# Patient Record
Sex: Male | Born: 1945
Health system: Southern US, Community
[De-identification: ages and names within clinical notes are randomized; demographics above are authoritative.]

## PROBLEM LIST (undated history)

## (undated) DIAGNOSIS — F329 Major depressive disorder, single episode, unspecified: Secondary | ICD-10-CM

## (undated) DIAGNOSIS — Z0000 Encounter for general adult medical examination without abnormal findings: Secondary | ICD-10-CM

## (undated) DIAGNOSIS — H269 Unspecified cataract: Secondary | ICD-10-CM

## (undated) DIAGNOSIS — F419 Anxiety disorder, unspecified: Secondary | ICD-10-CM

## (undated) DIAGNOSIS — F32A Depression, unspecified: Secondary | ICD-10-CM

## (undated) DIAGNOSIS — D571 Sickle-cell disease without crisis: Secondary | ICD-10-CM

## (undated) DIAGNOSIS — M26629 Arthralgia of temporomandibular joint, unspecified side: Secondary | ICD-10-CM

## (undated) DIAGNOSIS — J45909 Unspecified asthma, uncomplicated: Secondary | ICD-10-CM

## (undated) DIAGNOSIS — I10 Essential (primary) hypertension: Secondary | ICD-10-CM

## (undated) DIAGNOSIS — E785 Hyperlipidemia, unspecified: Secondary | ICD-10-CM

## (undated) DIAGNOSIS — H4010X0 Unspecified open-angle glaucoma, stage unspecified: Secondary | ICD-10-CM

## (undated) DIAGNOSIS — M109 Gout, unspecified: Secondary | ICD-10-CM

## (undated) DIAGNOSIS — R001 Bradycardia, unspecified: Secondary | ICD-10-CM

## (undated) DIAGNOSIS — E119 Type 2 diabetes mellitus without complications: Secondary | ICD-10-CM

## (undated) DIAGNOSIS — N189 Chronic kidney disease, unspecified: Secondary | ICD-10-CM

## (undated) DIAGNOSIS — C801 Malignant (primary) neoplasm, unspecified: Secondary | ICD-10-CM

## (undated) HISTORY — DX: Unspecified cataract: H26.9

## (undated) HISTORY — DX: Unspecified asthma, uncomplicated: J45.909

## (undated) HISTORY — DX: Arthralgia of temporomandibular joint, unspecified side: M26.629

## (undated) HISTORY — DX: Gout, unspecified: M10.9

## (undated) HISTORY — PX: COLONOSCOPY: SHX174

## (undated) HISTORY — DX: Major depressive disorder, single episode, unspecified: F32.9

## (undated) HISTORY — DX: Chronic kidney disease, unspecified: N18.9

## (undated) HISTORY — DX: Type 2 diabetes mellitus without complications: E11.9

## (undated) HISTORY — DX: Essential (primary) hypertension: I10

## (undated) HISTORY — DX: Bradycardia, unspecified: R00.1

## (undated) HISTORY — DX: Unspecified open-angle glaucoma, stage unspecified: H40.10X0

## (undated) HISTORY — DX: Encounter for general adult medical examination without abnormal findings: Z00.00

## (undated) HISTORY — DX: Hyperlipidemia, unspecified: E78.5

## (undated) HISTORY — DX: Anxiety disorder, unspecified: F41.9

## (undated) HISTORY — DX: Malignant (primary) neoplasm, unspecified: C80.1

## (undated) HISTORY — DX: Sickle-cell disease without crisis: D57.1

## (undated) HISTORY — DX: Depression, unspecified: F32.A

---

## 1995-12-05 HISTORY — PX: INGUINAL HERNIA REPAIR: SUR1180

## 2001-12-04 DIAGNOSIS — F419 Anxiety disorder, unspecified: Secondary | ICD-10-CM

## 2001-12-04 HISTORY — PX: COLON SURGERY: SHX602

## 2001-12-04 HISTORY — DX: Anxiety disorder, unspecified: F41.9

## 2002-08-26 ENCOUNTER — Encounter: Payer: Self-pay | Admitting: General Surgery

## 2002-08-26 ENCOUNTER — Ambulatory Visit (HOSPITAL_COMMUNITY): Admission: RE | Admit: 2002-08-26 | Discharge: 2002-08-26 | Payer: Self-pay | Admitting: General Surgery

## 2002-09-05 ENCOUNTER — Encounter: Payer: Self-pay | Admitting: General Surgery

## 2002-09-11 ENCOUNTER — Encounter (INDEPENDENT_AMBULATORY_CARE_PROVIDER_SITE_OTHER): Payer: Self-pay | Admitting: Specialist

## 2002-09-11 ENCOUNTER — Inpatient Hospital Stay (HOSPITAL_COMMUNITY): Admission: RE | Admit: 2002-09-11 | Discharge: 2002-09-17 | Payer: Self-pay | Admitting: General Surgery

## 2002-10-07 ENCOUNTER — Ambulatory Visit (HOSPITAL_COMMUNITY): Admission: RE | Admit: 2002-10-07 | Discharge: 2002-10-07 | Payer: Self-pay | Admitting: General Surgery

## 2002-10-07 ENCOUNTER — Encounter: Payer: Self-pay | Admitting: General Surgery

## 2003-10-22 ENCOUNTER — Ambulatory Visit (HOSPITAL_COMMUNITY): Admission: RE | Admit: 2003-10-22 | Discharge: 2003-10-22 | Payer: Self-pay | Admitting: General Surgery

## 2004-10-26 ENCOUNTER — Ambulatory Visit: Payer: Self-pay | Admitting: Family Medicine

## 2005-07-20 ENCOUNTER — Ambulatory Visit: Payer: Self-pay | Admitting: Family Medicine

## 2005-11-23 ENCOUNTER — Ambulatory Visit: Payer: Self-pay | Admitting: Family Medicine

## 2006-01-17 ENCOUNTER — Ambulatory Visit: Payer: Self-pay | Admitting: Family Medicine

## 2006-04-18 ENCOUNTER — Ambulatory Visit: Payer: Self-pay | Admitting: Family Medicine

## 2006-06-14 ENCOUNTER — Ambulatory Visit: Payer: Self-pay | Admitting: Family Medicine

## 2006-09-27 ENCOUNTER — Ambulatory Visit: Payer: Self-pay | Admitting: Family Medicine

## 2006-09-30 ENCOUNTER — Encounter: Admission: RE | Admit: 2006-09-30 | Discharge: 2006-09-30 | Payer: Self-pay | Admitting: Family Medicine

## 2006-11-01 ENCOUNTER — Ambulatory Visit: Payer: Self-pay | Admitting: Family Medicine

## 2007-01-03 ENCOUNTER — Ambulatory Visit: Payer: Self-pay | Admitting: Family Medicine

## 2007-02-25 ENCOUNTER — Ambulatory Visit: Payer: Self-pay | Admitting: Gastroenterology

## 2007-03-14 ENCOUNTER — Ambulatory Visit: Payer: Self-pay | Admitting: Gastroenterology

## 2007-07-19 DIAGNOSIS — Z85038 Personal history of other malignant neoplasm of large intestine: Secondary | ICD-10-CM

## 2007-07-19 DIAGNOSIS — I10 Essential (primary) hypertension: Secondary | ICD-10-CM

## 2007-07-19 DIAGNOSIS — F329 Major depressive disorder, single episode, unspecified: Secondary | ICD-10-CM

## 2007-07-25 ENCOUNTER — Telehealth: Payer: Self-pay | Admitting: Family Medicine

## 2007-08-29 ENCOUNTER — Telehealth (INDEPENDENT_AMBULATORY_CARE_PROVIDER_SITE_OTHER): Payer: Self-pay | Admitting: *Deleted

## 2007-10-17 ENCOUNTER — Ambulatory Visit: Payer: Self-pay | Admitting: Family Medicine

## 2007-10-17 DIAGNOSIS — E039 Hypothyroidism, unspecified: Secondary | ICD-10-CM | POA: Insufficient documentation

## 2007-10-17 DIAGNOSIS — E785 Hyperlipidemia, unspecified: Secondary | ICD-10-CM | POA: Insufficient documentation

## 2007-10-17 LAB — CONVERTED CEMR LAB
Blood in Urine, dipstick: NEGATIVE
Ketones, urine, test strip: NEGATIVE
Nitrite: NEGATIVE
Protein, U semiquant: NEGATIVE
Specific Gravity, Urine: 1.015
Urobilinogen, UA: 0.2
WBC Urine, dipstick: NEGATIVE

## 2007-10-20 ENCOUNTER — Encounter: Payer: Self-pay | Admitting: Family Medicine

## 2007-11-11 LAB — CONVERTED CEMR LAB
ALT: 30 units/L (ref 0–53)
AST: 23 units/L (ref 0–37)
Albumin: 3.6 g/dL (ref 3.5–5.2)
Basophils Absolute: 0.1 10*3/uL (ref 0.0–0.1)
Calcium: 10.1 mg/dL (ref 8.4–10.5)
Chloride: 101 meq/L (ref 96–112)
Creatinine, Ser: 1.7 mg/dL — ABNORMAL HIGH (ref 0.4–1.5)
Eosinophils Absolute: 0.1 10*3/uL (ref 0.0–0.6)
Eosinophils Relative: 1.5 % (ref 0.0–5.0)
GFR calc non Af Amer: 44 mL/min
Glucose, Bld: 110 mg/dL — ABNORMAL HIGH (ref 70–99)
HCT: 42.8 % (ref 39.0–52.0)
HDL: 25.7 mg/dL — ABNORMAL LOW (ref >39.0)
MCV: 76.7 fL — ABNORMAL LOW (ref 78.0–100.0)
Platelets: 343 10*3/uL (ref 150–400)
RBC: 5.58 M/uL (ref 4.22–5.81)
RDW: 16.1 % — ABNORMAL HIGH (ref 11.5–14.6)
Total Bilirubin: 0.6 mg/dL (ref 0.3–1.2)
Total CHOL/HDL Ratio: 6.3
Triglycerides: 254 mg/dL (ref 0–149)
VLDL: 51 mg/dL — ABNORMAL HIGH (ref 0–40)
WBC: 8.8 10*3/uL (ref 4.5–10.5)

## 2007-12-26 ENCOUNTER — Telehealth: Payer: Self-pay | Admitting: Family Medicine

## 2008-04-21 ENCOUNTER — Telehealth: Payer: Self-pay | Admitting: Family Medicine

## 2008-06-25 ENCOUNTER — Ambulatory Visit: Payer: Self-pay | Admitting: Family Medicine

## 2008-06-25 DIAGNOSIS — M545 Low back pain, unspecified: Secondary | ICD-10-CM | POA: Insufficient documentation

## 2008-06-25 DIAGNOSIS — R972 Elevated prostate specific antigen [PSA]: Secondary | ICD-10-CM | POA: Insufficient documentation

## 2008-06-25 DIAGNOSIS — M26629 Arthralgia of temporomandibular joint, unspecified side: Secondary | ICD-10-CM | POA: Insufficient documentation

## 2008-10-27 ENCOUNTER — Ambulatory Visit: Payer: Self-pay | Admitting: Family Medicine

## 2008-10-27 DIAGNOSIS — J309 Allergic rhinitis, unspecified: Secondary | ICD-10-CM | POA: Insufficient documentation

## 2008-10-27 DIAGNOSIS — E559 Vitamin D deficiency, unspecified: Secondary | ICD-10-CM

## 2008-10-27 DIAGNOSIS — D649 Anemia, unspecified: Secondary | ICD-10-CM

## 2008-10-27 DIAGNOSIS — T50995A Adverse effect of other drugs, medicaments and biological substances, initial encounter: Secondary | ICD-10-CM

## 2009-03-11 ENCOUNTER — Ambulatory Visit: Payer: Self-pay | Admitting: Family Medicine

## 2009-03-11 DIAGNOSIS — M109 Gout, unspecified: Secondary | ICD-10-CM | POA: Insufficient documentation

## 2009-03-13 LAB — CONVERTED CEMR LAB: Uric Acid, Serum: 7.3 mg/dL (ref 4.0–7.8)

## 2009-10-05 ENCOUNTER — Telehealth: Payer: Self-pay | Admitting: Family Medicine

## 2009-11-03 ENCOUNTER — Ambulatory Visit: Payer: Self-pay | Admitting: Family Medicine

## 2009-11-03 DIAGNOSIS — R350 Frequency of micturition: Secondary | ICD-10-CM

## 2009-11-03 DIAGNOSIS — N400 Enlarged prostate without lower urinary tract symptoms: Secondary | ICD-10-CM | POA: Insufficient documentation

## 2009-11-03 LAB — CONVERTED CEMR LAB
Glucose, Urine, Semiquant: NEGATIVE
Specific Gravity, Urine: 1.01
WBC Urine, dipstick: NEGATIVE
pH: 5.5

## 2009-11-04 LAB — CONVERTED CEMR LAB
ALT: 18 units/L (ref 0–53)
Albumin: 3.9 g/dL (ref 3.5–5.2)
BUN: 12 mg/dL (ref 6–23)
Basophils Absolute: 0 10*3/uL (ref 0.0–0.1)
CO2: 31 meq/L (ref 19–32)
Chloride: 102 meq/L (ref 96–112)
Direct LDL: 135.3 mg/dL
Glucose, Bld: 104 mg/dL — ABNORMAL HIGH (ref 70–99)
HCT: 45.9 % (ref 39.0–52.0)
Lymphs Abs: 1.9 10*3/uL (ref 0.7–4.0)
MCV: 80.7 fL (ref 78.0–100.0)
Monocytes Absolute: 0.5 10*3/uL (ref 0.1–1.0)
Platelets: 171 10*3/uL (ref 150.0–400.0)
Potassium: 4.5 meq/L (ref 3.5–5.1)
RDW: 14.6 % (ref 11.5–14.6)
TSH: 2.25 microintl units/mL (ref 0.35–5.50)
Total Bilirubin: 0.8 mg/dL (ref 0.3–1.2)
Vit D, 25-Hydroxy: 19 ng/mL — ABNORMAL LOW (ref 30–89)

## 2010-07-05 ENCOUNTER — Ambulatory Visit: Payer: Self-pay | Admitting: Family Medicine

## 2010-12-27 ENCOUNTER — Telehealth: Payer: Self-pay | Admitting: Family Medicine

## 2010-12-29 ENCOUNTER — Ambulatory Visit
Admission: RE | Admit: 2010-12-29 | Discharge: 2010-12-29 | Payer: Self-pay | Source: Home / Self Care | Attending: Family Medicine | Admitting: Family Medicine

## 2010-12-29 DIAGNOSIS — M67919 Unspecified disorder of synovium and tendon, unspecified shoulder: Secondary | ICD-10-CM | POA: Insufficient documentation

## 2010-12-29 DIAGNOSIS — K589 Irritable bowel syndrome without diarrhea: Secondary | ICD-10-CM | POA: Insufficient documentation

## 2010-12-29 DIAGNOSIS — M719 Bursopathy, unspecified: Secondary | ICD-10-CM

## 2011-01-01 LAB — CONVERTED CEMR LAB
ALT: 20 units/L (ref 0–53)
Basophils Absolute: 0 10*3/uL (ref 0.0–0.1)
Bilirubin Urine: NEGATIVE
Bilirubin, Direct: 0.1 mg/dL (ref 0.0–0.3)
Blood in Urine, dipstick: NEGATIVE
CO2: 32 meq/L (ref 19–32)
Calcium: 10 mg/dL (ref 8.4–10.5)
Chloride: 103 meq/L (ref 96–112)
Cholesterol: 210 mg/dL (ref 0–200)
Glucose, Urine, Semiquant: NEGATIVE
HDL: 37.4 mg/dL — ABNORMAL LOW (ref >39.0)
Lymphocytes Relative: 41 % (ref 12.0–46.0)
MCHC: 33.2 g/dL (ref 30.0–36.0)
Neutro Abs: 2.2 10*3/uL (ref 1.4–7.7)
Neutrophils Relative %: 44.9 % (ref 43.0–77.0)
PSA: 8.16 ng/mL — ABNORMAL HIGH (ref 0.10–4.00)
Platelets: 176 10*3/uL (ref 150–400)
Potassium: 4.2 meq/L (ref 3.5–5.1)
Protein, U semiquant: NEGATIVE
RDW: 15.4 % — ABNORMAL HIGH (ref 11.5–14.6)
Sodium: 141 meq/L (ref 135–145)
Total Bilirubin: 0.5 mg/dL (ref 0.3–1.2)
Triglycerides: 298 mg/dL (ref 0–149)
Urobilinogen, UA: 0.2
VLDL: 60 mg/dL — ABNORMAL HIGH (ref 0–40)
pH: 7

## 2011-01-03 NOTE — Assessment & Plan Note (Signed)
Summary: ROA/RCD   Vital Signs:  Patient profile:   65 year old male Weight:      211 pounds O2 Sat:      94 % Temp:     98.3 degrees F Pulse rate:   60 / minute Pulse rhythm:   regular BP sitting:   140 / 82  (left arm) Cuff size:   regular  Vitals Entered By: Pura Spice, RN (July 05, 2010 2:30 PM) CC: gout hasn't taken indocin in 2 wks    History of Present Illness: this 65 year old black male with past history of colon cancer in remission, hypertension which is controlled he is in today complaining of pain in both MP joints of the big toes bilaterally swollen painful tender and erythematous area patient has not taken Indocin for the past 2 weeks No other complaint other than refill and necessary medicines  Allergies (verified): No Known Drug Allergies  Past History:  Past Medical History: Last updated: 11/03/2009 Glaucoma Colon cancer, hx of Depression Hypertension allergic rhinitis  PAP MAMMOGRAM EYE EXAM  dr Marene Lenz   2009 EKG   2009 DEXA-BONE DENSITY PNEUMONIA VACCINE SHINGLES VACCINE DT  2007 Colon   2008  repeat  5 yrs   SMOKER  no    Specialist: Dr Harlin Rain uroglogist   Past Surgical History: Last updated: 11/03/2009 Hernia Repair colon cancer surgery  Social History: Last updated: 07/19/2007 Occupation: Married Never Smoked Alcohol use-no Drug use-no Regular exercise-yes  Risk Factors: Smoking Status: never (11/03/2009)  Review of Systems      See HPI  The patient denies anorexia, fever, weight loss, weight gain, vision loss, decreased hearing, hoarseness, chest pain, syncope, dyspnea on exertion, peripheral edema, prolonged cough, headaches, hemoptysis, abdominal pain, melena, hematochezia, severe indigestion/heartburn, hematuria, incontinence, genital sores, muscle weakness, suspicious skin lesions, transient blindness, difficulty walking, depression, unusual weight change, abnormal bleeding, enlarged lymph nodes, angioedema, breast  masses, and testicular masses.    Physical Exam  General:  Well-developed,well-nourished,in no acute distress; alert,appropriate and cooperative throughout examination Head:  Normocephalic and atraumatic without obvious abnormalities. No apparent alopecia or balding. Eyes:  No corneal or conjunctival inflammation noted. EOMI. Perrla. Funduscopic exam benign, without hemorrhages, exudates or papilledema. Vision grossly normal. Ears:  External ear exam shows no significant lesions or deformities.  Otoscopic examination reveals clear canals, tympanic membranes are intact bilaterally without bulging, retraction, inflammation or discharge. Hearing is grossly normal bilaterally. Nose:  boggy pale nasal mucosa with clear drainage Mouth:  Oral mucosa and oropharynx without lesions or exudates.  Teeth in good repair. Lungs:  Normal respiratory effort, chest expands symmetrically. Lungs are clear to auscultation, no crackles or wheezes. Heart:  Normal rate and regular rhythm. S1 and S2 normal without gallop, murmur, click, rub or other extra sounds. Msk:  MP joints of first toes both feet swollen tender and warm Pulses:  R and L carotid,radial,femoral,dorsalis pedis and posterior tibial pulses are full and equal bilaterally Extremities:  No clubbing, cyanosis, edema, or deformity noted with normal full range of motion of all joints.   Neurologic:  No cranial nerve deficits noted. Station and gait are normal. Plantar reflexes are down-going bilaterally. DTRs are symmetrical throughout. Sensory, motor and coordinative functions appear intact. Skin:  Intact without suspicious lesions or rashes   Impression & Recommendations:  Problem # 1:  GOUT, ACUTE (ICD-274.01) Assessment New  His updated medication list for this problem includes:    Mobic 15 Mg Tabs (Meloxicam) .Marland Kitchen... 1 once daily for  arthritis    Indocin Sr 75 Mg Cr-caps (Indomethacin) .Marland Kitchen... 1 three times a day pc for gout, as pain deminishes,  decrease to 1 or2 per  day, restart when needed  Problem # 2:  HYPERTENSION (ICD-401.9) Assessment: Improved  His updated medication list for this problem includes:    Furosemide 40 Mg Tabs (Furosemide) .Marland Kitchen... Take 1 in the am and 1 in mid-afternoon for edema and bp    Benicar Hct 40-12.5 Mg Tabs (Olmesartan medoxomil-hctz) .Marland Kitchen... 1 tablet by mouth once a day  Problem # 3:  URINARY FREQUENCY (ICD-788.41) Assessment: Improved  His updated medication list for this problem includes:    Enablex 15 Mg Tb24 (Darifenacin hydrobromide) .Marland Kitchen... Take 1 tablet by mouth once a day    Flomax 0.4 Mg Cp24 (Tamsulosin hcl) .Marland Kitchen... Take 1 capsule by mouth once a day    Avodart 0.5 Mg Caps (Dutasteride) .Marland Kitchen... 1 each day for enlarged prostate    Vesicare 5 Mg Tabs (Solifenacin succinate) .Marland Kitchen... 1 qd  Problem # 4:  LOW BACK PAIN, CHRONIC (ICD-724.2) Assessment: Unchanged  His updated medication list for this problem includes:    Hydrocodone-acetaminophen 10-650 Mg Tabs (Hydrocodone-acetaminophen) .Marland Kitchen... Take 1 tablet by mouth every four to six hours no early refills no more than 4 per day    Mobic 15 Mg Tabs (Meloxicam) .Marland Kitchen... 1 once daily for arthritis    Adult Aspirin Low Strength 81 Mg Tbdp (Aspirin)    Indocin Sr 75 Mg Cr-caps (Indomethacin) .Marland Kitchen... 1 three times a day pc for gout, as pain deminishes, decrease to 1 or2 per  day, restart when needed  Problem # 5:  HYPERLIPIDEMIA (ICD-272.4) Assessment: Improved  His updated medication list for this problem includes:    Simvastatin 20 Mg Tabs (Simvastatin) .Marland Kitchen... 1 hs for high cholesterol  Complete Medication List: 1)  Furosemide 40 Mg Tabs (Furosemide) .... Take 1 in the am and 1 in mid-afternoon for edema and bp 2)  Benicar Hct 40-12.5 Mg Tabs (Olmesartan medoxomil-hctz) .Marland Kitchen.. 1 tablet by mouth once a day 3)  Enablex 15 Mg Tb24 (Darifenacin hydrobromide) .... Take 1 tablet by mouth once a day 4)  Flomax 0.4 Mg Cp24 (Tamsulosin hcl) .... Take 1 capsule by  mouth once a day 5)  Hydrocodone-acetaminophen 10-650 Mg Tabs (Hydrocodone-acetaminophen) .... Take 1 tablet by mouth every four to six hours no early refills no more than 4 per day 6)  Lexapro 10 Mg Tabs (Escitalopram oxalate) .... Take 1 tablet by mouth once a day 7)  Mobic 15 Mg Tabs (Meloxicam) .Marland Kitchen.. 1 once daily for arthritis 8)  Xalatan 0.005 % Soln (Latanoprost) .... Apply 9)  Cod Liver Oil Oil (Cod liver oil) 10)  Bl Garlic Oil 3 Mg Caps (Garlic) 11)  Adult Aspirin Low Strength 81 Mg Tbdp (Aspirin) 12)  Promethazine Hcl 25 Mg Tabs (Promethazine hcl) .Marland Kitchen.. 1 q 4h as needed nausea 13)  Allegra-d 12 Hour 60-120 Mg Xr12h-tab (Fexofenadine-pseudoephedrine) .Marland Kitchen.. 1 q12h as needed  for nasal congestion 14)  Indocin Sr 75 Mg Cr-caps (Indomethacin) .Marland Kitchen.. 1 three times a day pc for gout, as pain deminishes, decrease to 1 or2 per  day, restart when needed 15)  Avodart 0.5 Mg Caps (Dutasteride) .Marland Kitchen.. 1 each day for enlarged prostate 16)  Simvastatin 20 Mg Tabs (Simvastatin) .Marland Kitchen.. 1 hs for high cholesterol 17)  Vitamin D (ergocalciferol) 50000 Unit Caps (Ergocalciferol) .Marland KitchenMarland KitchenMarland Kitchen 1 weekly for 12 weeks 18)  Vesicare 5 Mg Tabs (Solifenacin succinate) .Marland Kitchen.. 1 qd  Patient  Instructions: 1)  acute gout episode, restart her indomethacin t.i.d. 2)  Continue other medications as prescribed Prescriptions: HYDROCODONE-ACETAMINOPHEN 10-650 MG TABS (HYDROCODONE-ACETAMINOPHEN) Take 1 tablet by mouth every four to six hours NO EARLY REFILLS NO MORE THAN 4 PER DAY  #120 x 5   Entered and Authorized by:   Judithann Sheen MD   Signed by:   Judithann Sheen MD on 07/05/2010   Method used:   Print then Give to Patient   RxID:   [id number]

## 2011-01-05 NOTE — Assessment & Plan Note (Signed)
Summary: stomach issues/per Dr. Marlon Pel   Vital Signs:  Patient profile:   65 year old male Weight:      213 pounds BMI:     28.99 Pulse rate:   68 / minute Pulse rhythm:   regular BP sitting:   142 / 94  (right arm)  Vitals Entered By: Kyung Rudd, CMA (December 29, 2010 2:59 PM) CC: pt c/o stomach issues x 4days.Marland KitchenMarland Kitchenfeels like a knot in stomach...pt also c/o rt shoulder pain   History of Present Illness: This 65 year old black disabled male with past history of carcinoma of the colon who has done well and is in remission following surgery and chemotherapy. His complaint today is that of having cramping abdominal pain over the past week and has had 2-3 occasions of nausea and vomiting. This is improved somewhat but continued to have wrong with him over the abdomen no diarrhea  Hypertension is well controlled Chronic rhinitis controlled with Allegra-D Past history of gallop which had been fairly well controlled No other complaints Continues to have complaint of chronic back pain  Current Medications (verified): 1)  Furosemide 40 Mg  Tabs (Furosemide) .... Take 1 in The Am and 1 in Mid-Afternoon For Edema and Bp 2)  Benicar Hct 40-12.5 Mg Tabs (Olmesartan Medoxomil-Hctz) .Marland Kitchen.. 1 Tablet By Mouth Once A Day 3)  Flomax 0.4 Mg Cp24 (Tamsulosin Hcl) .... Take 1 Capsule By Mouth Once A Day 4)  Hydrocodone-Acetaminophen 10-650 Mg Tabs (Hydrocodone-Acetaminophen) .... Take 1 Tablet By Mouth Every Four To Six Hours No Early Refills No More Than 4 Per Day 5)  Lexapro 10 Mg Tabs (Escitalopram Oxalate) .... Take 1 Tablet By Mouth Once A Day 6)  Mobic 15 Mg Tabs (Meloxicam) .Marland Kitchen.. 1 Once Daily For Arthritis 7)  Xalatan 0.005 % Soln (Latanoprost) .... Apply 8)  Cod Liver Oil   Oil (Cod Liver Oil) 9)  Bl Garlic Oil 3 Mg  Caps (Garlic) 10)  Adult Aspirin Low Strength 81 Mg  Tbdp (Aspirin) 11)  Promethazine Hcl 25 Mg  Tabs (Promethazine Hcl) .Marland Kitchen.. 1 Q 4h As Needed Nausea 12)  Allegra-D 12 Hour  60-120 Mg Xr12h-Tab (Fexofenadine-Pseudoephedrine) .Marland Kitchen.. 1 Q12h As Needed  For Nasal Congestion 13)  Indocin Sr 75 Mg Cr-Caps (Indomethacin) .Marland Kitchen.. 1 Three Times A Day Pc For Gout, As Pain Deminishes, Decrease To 1 Or2 Per  Day, Restart When Needed 14)  Avodart 0.5 Mg Caps (Dutasteride) .Marland Kitchen.. 1 Each Day For Enlarged Prostate 15)  Simvastatin 20 Mg Tabs (Simvastatin) .Marland Kitchen.. 1 Hs For High Cholesterol 16)  Vitamin D (Ergocalciferol) 50000 Unit Caps (Ergocalciferol) .Marland Kitchen.. 1 Weekly For 12 Weeks 17)  Vesicare 5 Mg Tabs (Solifenacin Succinate) .Marland Kitchen.. 1 Qd  Allergies (verified): No Known Drug Allergies  Past History:  Past Medical History: Last updated: 11/03/2009 Glaucoma Colon cancer, hx of Depression Hypertension allergic rhinitis  PAP MAMMOGRAM EYE EXAM  dr Marene Lenz   2009 EKG   2009 DEXA-BONE DENSITY PNEUMONIA VACCINE SHINGLES VACCINE DT  2007 Colon   2008  repeat  5 yrs   SMOKER  no    Specialist: Dr Harlin Rain uroglogist   Past Surgical History: Last updated: 11/03/2009 Hernia Repair colon cancer surgery  Social History: Last updated: 12/29/2010 Occupation:disabled Married Never Smoked Alcohol use-no Drug use-no Regular exercise-yes  Risk Factors: Smoking Status: never (11/03/2009)  Social History: Reviewed history from 07/19/2007 and no changes required. Occupation:disabled Married Never Smoked Alcohol use-no Drug use-no Regular exercise-yes  Review of Systems      See HPI  Physical Exam  General:  Well-developed,well-nourished,in no acute distress; alert,appropriate and cooperative throughout examination Lungs:  Normal respiratory effort, chest expands symmetrically. Lungs are clear to auscultation, no crackles or wheezes. Heart:  Normal rate and regular rhythm. S1 and S2 normal without gallop, murmur, click, rub or other extra sounds. Abdomen:  increased Valsalva was I'll dominant tenderness over the upper and lower abdomen no portal tenderness liver spleen  and kidneys are nonpalpable well healed operative scar from cancer surgery, colon surgery Rectal:  none exam Msk:  No deformity or scoliosis noted of thoracic or lumbar spine.     Impression & Recommendations:  Problem # 1:  IRRITABLE BOWEL SYNDROME (ICD-564.1) Assessment New  Bentyl 25 mg q.i.d. a.c. h.s.  Orders: Prescription Created Electronically [id number])  Problem # 2:  BURSITIS, RIGHT SHOULDER (ICD-726.10) Assessment: New  prednisone 20 mg decrease in dosage  Orders: Prescription Created Electronically [id number])  Problem # 3:  LOW BACK PAIN, CHRONIC (ICD-724.2) Assessment: Unchanged  His updated medication list for this problem includes:    Hydrocodone-acetaminophen 10-650 Mg Tabs (Hydrocodone-acetaminophen) .Marland Kitchen... Take 1 tablet by mouth every four to six hours no early refills no more than 4 per day    Mobic 15 Mg Tabs (Meloxicam) .Marland Kitchen... 1 once daily for arthritis    Adult Aspirin Low Strength 81 Mg Tbdp (Aspirin)    Indocin Sr 75 Mg Cr-caps (Indomethacin) .Marland Kitchen... 1 three times a day pc for gout, as pain deminishes, decrease to 1 or2 per  day, restart when needed  Problem # 4:  HYPERTENSION (ICD-401.9) Assessment: Improved  His updated medication list for this problem includes:    Furosemide 40 Mg Tabs (Furosemide) .Marland Kitchen... Take 1 in the am and 1 in mid-afternoon for edema and bp    Benicar Hct 40-12.5 Mg Tabs (Olmesartan medoxomil-hctz) .Marland Kitchen... 1 tablet by mouth once a day  Complete Medication List: 1)  Furosemide 40 Mg Tabs (Furosemide) .... Take 1 in the am and 1 in mid-afternoon for edema and bp 2)  Benicar Hct 40-12.5 Mg Tabs (Olmesartan medoxomil-hctz) .Marland Kitchen.. 1 tablet by mouth once a day 3)  Flomax 0.4 Mg Cp24 (Tamsulosin hcl) .... Take 1 capsule by mouth once a day 4)  Hydrocodone-acetaminophen 10-650 Mg Tabs (Hydrocodone-acetaminophen) .... Take 1 tablet by mouth every four to six hours no early refills no more than 4 per day 5)  Lexapro 10 Mg Tabs (Escitalopram oxalate)  .... Take 1 tablet by mouth once a day 6)  Mobic 15 Mg Tabs (Meloxicam) .Marland Kitchen.. 1 once daily for arthritis 7)  Xalatan 0.005 % Soln (Latanoprost) .... Apply 8)  Cod Liver Oil Oil (Cod liver oil) 9)  Bl Garlic Oil 3 Mg Caps (Garlic) 10)  Adult Aspirin Low Strength 81 Mg Tbdp (Aspirin) 11)  Promethazine Hcl 25 Mg Tabs (Promethazine hcl) .Marland Kitchen.. 1 q 4h as needed nausea 12)  Allegra-d 12 Hour 60-120 Mg Xr12h-tab (Fexofenadine-pseudoephedrine) .Marland Kitchen.. 1 q12h as needed  for nasal congestion 13)  Indocin Sr 75 Mg Cr-caps (Indomethacin) .Marland Kitchen.. 1 three times a day pc for gout, as pain deminishes, decrease to 1 or2 per  day, restart when needed 14)  Avodart 0.5 Mg Caps (Dutasteride) .Marland Kitchen.. 1 each day for enlarged prostate 15)  Simvastatin 20 Mg Tabs (Simvastatin) .Marland Kitchen.. 1 hs for high cholesterol 16)  Vitamin D (ergocalciferol) 50000 Unit Caps (Ergocalciferol) .Marland KitchenMarland KitchenMarland Kitchen 1 weekly for 12 weeks 17)  Prednisone 20 Mg Tabs (Prednisone) .Marland Kitchen.. 1 tidpc for 3 days then 1 bidpc for 6 days then 1 once  daily for shoulder 18)  Bentyl 20 Mg Tabs (Dicyclomine hcl) .Marland Kitchen.. 1 qid ac and hs for irritable bowel  Patient Instructions: 1)  your acute problems at this time his ear while syndrome take Bentyl 20 mg q.i.d. as instructed 2)  Her side his right shoulder take prednisone 20 mg decrease in dosage as instructed 3)  Refill of the medication Prescriptions: HYDROCODONE-ACETAMINOPHEN 10-650 MG TABS (HYDROCODONE-ACETAMINOPHEN) Take 1 tablet by mouth every four to six hours NO EARLY REFILLS NO MORE THAN 4 PER DAY  #120 x 5   Entered and Authorized by:   Judithann Sheen MD   Signed by:   Judithann Sheen MD on 12/29/2010   Method used:   Print then Give to Patient   RxID:   [id number] BENTYL 20 MG TABS (DICYCLOMINE HCL) 1 qid ac and hs for irritable bowel  #50 x 1   Entered and Authorized by:   Judithann Sheen MD   Signed by:   Judithann Sheen MD on 12/29/2010   Method used:   Electronically to        [facility]  (retail)       [address]       [place], [place]  [address]       Ph: [phone]       Fax: [phone]   RxID:   [id number] PREDNISONE 20 MG TABS (PREDNISONE) 1 tidpc for 3 days then 1 bidpc for 6 days then 1 once daily for shoulder  #24 x 0   Entered and Authorized by:   Judithann Sheen MD   Signed by:   Judithann Sheen MD on 12/29/2010   Method used:   Electronically to        [facility] (retail)       [address]       [place], [place]  [address]       Ph: [phone]       Fax: [phone]   RxID:   [id number]    Orders Added: 1)  Prescription Created Electronically [G8553] 2)  Est. Patient Level IV [27035]

## 2011-01-05 NOTE — Progress Notes (Signed)
Summary: Pt req work in appt with Dr Scotty Court Only re: stomach pain  Phone Note Call from Patient Call back at Home Phone [phone] Call back at [phone] cell   Caller: Patient Summary of Call: Pt called and is req to get a work in appt with Dr. Scotty Court only re: stomach issues. Pt refuses to see another doctor. Pls advise.  Initial call taken by: Lucy Antigua,  December 27, 2010 3:58 PM  Follow-up for Phone Call        per Dr Scotty Court ok to work in on Thursday Follow-up by: Alfred Levins, CMA,  December 27, 2010 5:18 PM  Additional Follow-up for Phone Call Additional follow up Details #1::        I called pt and sch for ov on Thurs 12/29/10 at 2:30pm Additional Follow-up by: Lucy Antigua,  December 28, 2010 4:50 PM

## 2011-01-21 ENCOUNTER — Other Ambulatory Visit: Payer: Self-pay | Admitting: Family Medicine

## 2011-02-09 ENCOUNTER — Encounter: Payer: Self-pay | Admitting: Family Medicine

## 2011-02-16 ENCOUNTER — Encounter: Payer: Self-pay | Admitting: Family Medicine

## 2011-02-16 ENCOUNTER — Ambulatory Visit (INDEPENDENT_AMBULATORY_CARE_PROVIDER_SITE_OTHER): Payer: Medicare Other | Admitting: Family Medicine

## 2011-02-16 VITALS — BP 160/90 | HR 59 | Temp 97.9°F | Ht 72.0 in | Wt 215.0 lb

## 2011-02-16 DIAGNOSIS — D649 Anemia, unspecified: Secondary | ICD-10-CM

## 2011-02-16 DIAGNOSIS — E039 Hypothyroidism, unspecified: Secondary | ICD-10-CM

## 2011-02-16 DIAGNOSIS — N401 Enlarged prostate with lower urinary tract symptoms: Secondary | ICD-10-CM

## 2011-02-16 DIAGNOSIS — E785 Hyperlipidemia, unspecified: Secondary | ICD-10-CM

## 2011-02-16 DIAGNOSIS — E559 Vitamin D deficiency, unspecified: Secondary | ICD-10-CM

## 2011-02-16 DIAGNOSIS — G253 Myoclonus: Secondary | ICD-10-CM

## 2011-02-16 DIAGNOSIS — I10 Essential (primary) hypertension: Secondary | ICD-10-CM

## 2011-02-16 DIAGNOSIS — R350 Frequency of micturition: Secondary | ICD-10-CM

## 2011-02-16 DIAGNOSIS — E569 Vitamin deficiency, unspecified: Secondary | ICD-10-CM

## 2011-02-16 DIAGNOSIS — N138 Other obstructive and reflux uropathy: Secondary | ICD-10-CM

## 2011-02-16 DIAGNOSIS — M109 Gout, unspecified: Secondary | ICD-10-CM

## 2011-02-16 DIAGNOSIS — Z85038 Personal history of other malignant neoplasm of large intestine: Secondary | ICD-10-CM

## 2011-02-16 LAB — POCT URINALYSIS DIPSTICK
Bilirubin, UA: NEGATIVE
Blood, UA: NEGATIVE
Glucose, UA: NEGATIVE
Ketones, UA: NEGATIVE
Leukocytes, UA: NEGATIVE
Nitrite, UA: NEGATIVE
Protein, UA: NEGATIVE
Spec Grav, UA: 1.01
Urobilinogen, UA: 0.2
pH, UA: 5.5

## 2011-02-16 LAB — CBC WITH DIFFERENTIAL/PLATELET
Basophils Absolute: 0 10*3/uL (ref 0.0–0.1)
Basophils Relative: 0.6 % (ref 0.0–3.0)
Eosinophils Absolute: 0.1 10*3/uL (ref 0.0–0.7)
Eosinophils Relative: 1.3 % (ref 0.0–5.0)
HCT: 40.7 % (ref 39.0–52.0)
Hemoglobin: 13.6 g/dL (ref 13.0–17.0)
Lymphocytes Relative: 37.2 % (ref 12.0–46.0)
Lymphs Abs: 1.8 10*3/uL (ref 0.7–4.0)
MCHC: 33.5 g/dL (ref 30.0–36.0)
MCV: 78.7 fl (ref 78.0–100.0)
Monocytes Absolute: 0.7 10*3/uL (ref 0.1–1.0)
Monocytes Relative: 13.4 % — ABNORMAL HIGH (ref 3.0–12.0)
Neutro Abs: 2.3 10*3/uL (ref 1.4–7.7)
Neutrophils Relative %: 47.5 % (ref 43.0–77.0)
Platelets: 183 10*3/uL (ref 150.0–400.0)
RBC: 5.17 Mil/uL (ref 4.22–5.81)
RDW: 16.9 % — ABNORMAL HIGH (ref 11.5–14.6)
WBC: 4.9 10*3/uL (ref 4.5–10.5)

## 2011-02-16 LAB — HEPATIC FUNCTION PANEL
Bilirubin, Direct: 0.1 mg/dL (ref 0.0–0.3)
Total Protein: 7.3 g/dL (ref 6.0–8.3)

## 2011-02-16 LAB — LIPID PANEL
HDL: 36.2 mg/dL — ABNORMAL LOW (ref >39.00)
Total CHOL/HDL Ratio: 4
Triglycerides: 222 mg/dL — ABNORMAL HIGH (ref 0.0–149.0)
VLDL: 44.4 mg/dL — ABNORMAL HIGH (ref 0.0–40.0)

## 2011-02-16 LAB — BASIC METABOLIC PANEL
Calcium: 9.5 mg/dL (ref 8.4–10.5)
Creatinine, Ser: 1.2 mg/dL (ref 0.4–1.5)
GFR: 74.99 mL/min (ref >60.00)
Glucose, Bld: 90 mg/dL (ref 70–99)
Sodium: 140 mEq/L (ref 135–145)

## 2011-02-16 LAB — LDL CHOLESTEROL, DIRECT: Direct LDL: 73.4 mg/dL

## 2011-02-16 LAB — PSA: PSA: 5.07 ng/mL — ABNORMAL HIGH (ref 0.10–4.00)

## 2011-02-16 LAB — TSH: TSH: 2.43 u[IU]/mL (ref 0.35–5.50)

## 2011-02-16 MED ORDER — TAMSULOSIN HCL 0.4 MG PO CAPS: 0.4000 mg | ORAL_CAPSULE | ORAL | Status: DC

## 2011-02-16 MED ORDER — PROMETHAZINE HCL 25 MG PO TABS: 25.0000 mg | ORAL_TABLET | ORAL | Status: DC | PRN

## 2011-02-16 MED ORDER — MELOXICAM 15 MG PO TABS: 15.0000 mg | ORAL_TABLET | Freq: Every day | ORAL | Status: DC

## 2011-02-16 MED ORDER — OLMESARTAN MEDOXOMIL-HCTZ 40-12.5 MG PO TABS: 1.0000 | ORAL_TABLET | Freq: Every day | ORAL | Status: DC

## 2011-02-16 MED ORDER — FUROSEMIDE 40 MG PO TABS: 40.0000 mg | ORAL_TABLET | Freq: Two times a day (BID) | ORAL | Status: DC

## 2011-02-16 MED ORDER — DICYCLOMINE HCL 20 MG PO TABS: 20.0000 mg | ORAL_TABLET | Freq: Three times a day (TID) | ORAL | Status: DC

## 2011-02-16 MED ORDER — SIMVASTATIN 20 MG PO TABS: 20.0000 mg | ORAL_TABLET | Freq: Every day | ORAL | Status: DC

## 2011-02-16 MED ORDER — ESCITALOPRAM OXALATE 10 MG PO TABS: 10.0000 mg | ORAL_TABLET | Freq: Every day | ORAL | Status: DC

## 2011-02-16 MED ORDER — DUTASTERIDE 0.5 MG PO CAPS: 0.5000 mg | ORAL_CAPSULE | Freq: Every day | ORAL | Status: DC

## 2011-02-17 LAB — VITAMIN D 25 HYDROXY (VIT D DEFICIENCY, FRACTURES): Vit D, 25-Hydroxy: 33 ng/mL (ref 30–89)

## 2011-02-26 ENCOUNTER — Other Ambulatory Visit: Payer: Self-pay | Admitting: Family Medicine

## 2011-02-28 NOTE — Telephone Encounter (Signed)
rx faxed to [facility] in [place] for indocin sr 75

## 2011-03-16 NOTE — Progress Notes (Signed)
Pt aware.

## 2011-03-27 ENCOUNTER — Encounter: Payer: Self-pay | Admitting: Family Medicine

## 2011-03-27 NOTE — Patient Instructions (Signed)
Daniel Gates  your medical problems are under control and I recommend you continue the same medications of which I will refill We'll notify you about your lab studies

## 2011-03-27 NOTE — Progress Notes (Signed)
  Subjective:    Patient ID: Daniel Gates, male    DOB: 03-08-1946, 65 y.o.   MRN: [id number] This 65 year old African American is in to discuss his medical problems which he states he has no new complaint. His urinary symptoms of frequency difficult urination and not clear her persist but have improved with treatment He has history of colon cancer but is in total remission Hypertension control 160/90 when he came in and then repeated 140/84 A since depression is much better continue known Lexapro He has chronic back pain for which he needs prednisone as well as hydrocodone and also on regular treatment of Mobic 15 mg each day Simvastatin 20 mg for hyperlipidemia 4 BPH with obstruction he is on Avodart Flomax HPI    Review of Systemssee history of present illness    Objective:   Physical Exam the patient is a well-developed well-nourished black male who is verbal pleasant and appears to be in no distress HEENT negative carotid pulses good thyroid nonpalpable Lungs are clear to palpation percussion and auscultation no dullness no wheezing Heart no evidence for cardiomegaly heart sounds good without murmurs peripheral pulses are good i Abdomen well-healed biopsy scar from colon cancer surgery liver spleen and kidneys are nonpalpable normal bowel sounds no tenderness no masses Genitalia normal Rectal exam reveals prostate nontender no nodules 1+ enlarged Extremities negative Neurological no positive findings Skin negative        Assessment & Plan:  BPH with obstruction and urinary frequency treated with Avodart and Flomax  Hypertension under control no change in treatment depression under control with Lexapro Allergic rhinitis controlled with Allegra Arthritis controlled on Mobic but at times treated with prednisone Gouty episodes treated with indomethacin Hyperlipidemia treated with simvastatin 20 mg h.s.

## 2011-04-21 NOTE — Discharge Summary (Signed)
   NAME:  Daniel Gates, Daniel Gates NO.:  [id number]   MEDICAL RECORD NO.:  [id number]                   PATIENT TYPE:  INP   LOCATION:  0467                                 FACILITY:  [facility]   PHYSICIAN:  Timothy E. Earlene Plater, M.D.              DATE OF BIRTH:  1946-08-31   DATE OF ADMISSION:  09/11/2002  DATE OF DISCHARGE:  09/17/2002                                 DISCHARGE SUMMARY   FINAL DIAGNOSES:  Adenocarcinoma transverse colon stage [id number].   HOSPITAL COURSE:  This patient was seen and admitted urgently for hepato  transverse colonic adenocarcinoma discovered by Dr. Victorino Dike on routine  colonoscopy in this 65 year old black male. He was carefully counselled  preop, urgently scheduled at his request. His preliminary CT scan showed no  evidence of metastasis, chest x-ray was negative and laboratory data is  unavailable at time of dictation though it is remembered that his laboratory  data were all within normal limits.   He was carefully counselled and ready to proceed.   He was admitted after being prepared at home and underwent exploratory  laparotomy and hepato transverse colectomy on September 11, 2002.   His postoperative recuperation was steady, smooth, a little slow in terms of  ambulation. His bowel function returned on the third day. He was begun on  clear liquid, advanced to regular and he tolerated that well. Blood pressure  was controlled with Cardura as at home. He was seen in consultation by Dr.  Lyndal Pulley and because the patient lives in [facility], he elected to take  his chemotherapy at [facility] and Dr. Catha Gosselin made those arrangements.   By October 14, he was ready for discharge tolerating a regular diet, bowels  were moving satisfactory and pain controlled with Percocet. He was  discharged with instructions and his wound was healing nicely and there were  no complications. He will be seen and followed early postop for removal of  stitches and arrangements for chemotherapy.                                               Timothy E. Earlene Plater, M.D.    TED/MEDQ  D:  10/07/2002  T:  10/07/2002  Job:  [id number]   cc:   Ulyess Mort, M.D. [facility]   Ellin Saba., M.D.

## 2011-04-21 NOTE — Op Note (Signed)
NAME:  Daniel Gates, Daniel Gates                        ACCOUNT NO.:  [id number]   MEDICAL RECORD NO.:  [id number]                   PATIENT TYPE:  INP   LOCATION:  0003                                 FACILITY:  [facility]   PHYSICIAN:  Timothy E. Earlene Plater, M.D.              DATE OF BIRTH:  11/27/1946   DATE OF PROCEDURE:  09/11/2002  DATE OF DISCHARGE:                                 OPERATIVE REPORT   PREOPERATIVE DIAGNOSES:  Adenocarcinoma of transverse colon.   POSTOPERATIVE DIAGNOSES:  Adenocarcinoma of transverse colon.   PROCEDURE:  Transverse colectomy.   SURGEON:  Timothy E. Earlene Plater, M.D.   ASSISTANT:  Abigail Miyamoto, MD   ANESTHESIA:  General.   INDICATIONS FOR PROCEDURE:  [name] had a routine screening  colonoscope, a tumor of the transverse colon was found and he has been  carefully counselled and scheduled at his convenience. The prep was  accomplished at home. He was seen in preop by anesthesia, evaluated,  identified and permit signed.   DESCRIPTION OF PROCEDURE:  He was taken to the operating room, placed  supine, general endotracheal anesthesia administered. The abdomen did not  need to be shaved, a Foley catheter, PAS hose and nasogastric tube placed.  The abdomen was prepped and draped in the usual  fashion. A long midline  incision was used to enter the abdominal cavity. General exploration  revealed a tattooed but small tumor in the transverse colon approximately  halfway between the hepatic flexure and the midline. Careful palpation of  the remainder of the colon was negative. The liver was free of disease. The  small bowel was normal, a large cyst on the left kidney was not molested. We  released the hepatic flexure and the ascending colon from the right gutter  with sharp and blunt dissection. We chose our site for resection  approximately 10 cm on either side of the tumor and this was just to the  right of the middle colic artery. The colon was transected at  this point  with a GIA staple device, the ascending colon was transected with the GIA  stapling device at approximately the mid ascending colon. Since both the  transverse and the right colon were redundant, it was easy to approximate  the remaining ends of colon. The mesentery of the specimen was divided  carefully between clamps and tied with silk. The specimen was passed off the  table, sent to the laboratory fresh. The ends of the colon easily reached  and were in the right upper quadrant. They were laid side by side, suture  side by side and using the 75 GIA staple device after making punctures in  both ends of the colon using the stapler, an anastomosis was created. This  was intact and hemostatic. The puncture sites were closed with a TA-30  staple device. This appeared to be air and water tight. It was checked for  hemostasis.  The mesentery was closed with silk sutures. The new anastomosis  lay in the right upper quadrant. Irrigation was carried out and was clear.  Counts were  correct. The viscera were returned to the abdomen in their anatomic  positions. The abdomen was closed in a single layer with #1 PDS suture. The  subcu irrigated, skin closed with wide skin staples. Second count was  correct. He tolerated it well. Estimated blood loss was nil and there were  no complications.                                               Timothy E. Earlene Plater, M.D.    TED/MEDQ  D:  09/11/2002  T:  09/11/2002  Job:  [id number]   cc:   Ellin Saba., M.D.   Ulyess Mort, M.D. [facility]

## 2011-04-21 NOTE — Op Note (Signed)
NAME:  Daniel Gates, Daniel Gates                        ACCOUNT NO.:  [id number]   MEDICAL RECORD NO.:  [id number]                   PATIENT TYPE:  AMB   LOCATION:  DAY                                  FACILITY:  [facility]   PHYSICIAN:  Timothy E. Earlene Plater, M.D.              DATE OF BIRTH:  [birth year]   DATE OF PROCEDURE:  10/07/2002  DATE OF DISCHARGE:                                 OPERATIVE REPORT   PREOPERATIVE DIAGNOSES:  Carcinoma of the colon, chemotherapy.   POSTOPERATIVE DIAGNOSES:  Carcinoma of the colon, chemotherapy.   PROCEDURE:  Placement of Port-A-Cath.   SURGEON:  Timothy E. Earlene Plater, M.D.   ANESTHESIA:  Local standby.   INDICATIONS FOR PROCEDURE:  Mr. Schappell underwent colon resection in early  October, because of its stage he has elected to proceed with chemotherapy  which is to begin next week in [facility] and he agrees and understands that  he will need a Port-A-Cath and agrees to the procedure.   DESCRIPTION OF PROCEDURE:  The patient was evaluated by anesthesia,  identified, permit signed.   Taken to the operating room, placed supine, carefully comforted, positioned  then padded, placed in the head down position, IV started, sedation given.  The upper chest and neck were prepped and draped in the usual fashion. Local  anesthesia was provided throughout with 0.25% Marcaine with epinephrine. I  approached the left subclavian, isolated it on the first pass, introduced a  guidewire and under fluoroscopy the venous structures on the left were  obviously unusual and though the guidewire passed easily it would not go  into the superior vena cava; therefore, I had to change to the right side  where I isolated the right subclavian on the first pass, threaded the  guidewire and it was in perfect position. I then tried to introduce the  introducing sheath over the guidewire and it would not pass. I had to change  the introducer sheath to a different brand which then passed  nicely over the  guidewire in good position. Guidewire removed, the catheter of the Port-A-  Cath device was irrigated and passed through the introducing sheath and  positioned  under fluoroscopy. Sheath removed. Catheter in good position,  both lungs examined under fluoroscopy, no evidence of pneumothorax. Then  down on the right upper chest under local anesthesia, a pocket was made for  the port device, the catheter was passed subcutaneously from the  introduction site to the port site, carefully positioned and connected to  the port and locked. The port sewn down with 2-0 Vicryl. Then using  fluoroscopy, I again surveyed this area. The catheter was smooth, unkinked  and in good position and again no evidence for pneumothorax. With this, the  procedure was finished and closure was accomplished with interrupted and  continuous 3-0 Monocryl. The port was clearly marked on the skin and then  the Steri-Strips were applied to  the incision and covered with an OpSite  bandage. Then the port was irrigated with concentrated heparin solution. He  tolerated it well and was removed to the recovery room in good condition.  Counts correct.   Written and verbal instructions including Darvocet-N #36, refill one. A  portable chest x-ray will be made in the recovery room and faxed to [facility] today.                                               Timothy E. Earlene Plater, M.D.    TED/MEDQ  D:  10/07/2002  T:  10/07/2002  Job:  [id number]

## 2011-04-21 NOTE — Consult Note (Signed)
NAME:  Daniel Gates, Daniel Gates NO.:  [id number]   MEDICAL RECORD NO.:  [id number]                   PATIENT TYPE:  INP   LOCATION:  0467                                 FACILITY:  [facility]   PHYSICIAN:  Lowell C. Catha Gosselin, M.D.               DATE OF BIRTH:  [birth year]   DATE OF CONSULTATION:  09/15/2002  DATE OF DISCHARGE:                                   CONSULTATION   REASON FOR CONSULTATION:  New diagnosis of colon cancer.   REFERRING PHYSICIAN:  Timothy E. Earlene Plater, M.D.   HISTORY OF PRESENT ILLNESS:  The patient is a 65 year old gentleman who was  found to have a tumor in the transverse colon upon routine screening  colonoscopy on 08/19/02.  CT scan of the abdomen and pelvis on 08/26/02, were  negative for evidence of metastatic disease.  On 09/11/02, he underwent  transverse colectomy by Dr. Earlene Plater with final pathology [id number]) showing  a 2.5 cm tumor invasive through the muscularis propria into the subserosa,  negative margins, positive vascular/lymphatic invasion, and involvement of  5:13 pericolonic lymph nodes.  Preoperative chest x-ray was negative.  Preoperative CEA was 1.4.   PAST MEDICAL HISTORY:  1. Hypertension.  2. Seasonal allergies.  3. Status post inguinal hernia repair.   MEDICATIONS:  1. Cardura 4 mg q.d.  2. Pepcid 20 mg q.12h.  3. Dilaudid PCA.   ALLERGIES:  No known drug allergies.   FAMILY HISTORY:  Father deceased with a history of prostate cancer.  Mother  deceased with myocardial infarction.  Brother with a history of prostate  cancer.  One brother and three sisters living, reported to be healthy.   SOCIAL HISTORY:  The patient lives in [place].  He is married.  He has  seven children who are all healthy.  He is employed as a Nurse, mental health.  He has no history of ETOH or tobacco use.   REVIEW OF SYMPTOMS:  The patient denies any weight loss or anorexia.  He has  had good energy.  He was having no pain prior to  surgery.  He denies any  unusual headaches.  He does wears glasses.  He denies any shortness of  breath or cough.  He has had no chest pain.  He denies any peripheral edema.  He has had no recent change in his bowel habits and denies any rectal  bleeding.  He has had no hematuria or dysuria.   PHYSICAL EXAMINATION:  VITAL SIGNS:  Temperature 99.3, heart rate 76,  respirations 20, blood pressure 137/93, oxygen saturation 95% on 2 L.  Height 71 inches, weight 212 pounds.  GENERAL:  Well-nourished African-American male in no acute distress.  HEENT:  Normocephalic, atraumatic.  Pupils equal, round, reactive to light.  Extraocular movements were intact.  Sclerae anicteric.  Oropharynx is clear.  LYMPH:  No palpable cervical, supraclavicular, or axillary lymph nodes.  CHEST:  Breath sounds are diminished at the bases.  CARDIOVASCULAR:  Regular rate and rhythm.  ABDOMEN:  Midline incision intact with staples.  EXTREMITIES:  No edema.  NEUROLOGIC:  Alert and oriented x3.   LABORATORY DATA:  On 08/20/02, hemoglobin 15, white count 5.9, platelets  195,000, MCV 72.1.  Sodium 137, potassium 4.4, BUN 13, creatinine 1.6,  glucose 94.  Total bilirubin 0.5, alkaline phosphatase 81, SGOT 15, SGPT 14,  total protein 8.1, albumin 4.5, calcium 9.7.  Preoperative CEA 1.4 (drawn on  08/20/02).   RADIOLOGY:  Chest x-ray, cardiomegaly, no active disease.   IMPRESSION AND PLAN:  The patient is a 65 year old gentleman with newly  diagnosed Duke's C2 colon cancer, status post a transverse colectomy.  He  will need adjuvant 5-FU/Leukovorin therapy weekly for 26 weeks, likely  starting in the next three to four weeks.  We have arranged for him to have  followup at the [facility] due to convenience and  need for frequent visits.  He will see Dr. Gypsy Lore on 09/23/02 at 10:30  a.m.   The patient was seen and examined by Dr. Catha Gosselin; chart reviewed.      Lonna Cobb, N.P.                          Quintin Alto C. Catha Gosselin, M.D.    LT/MEDQ  D:  09/15/2002  T:  09/15/2002  Job:  [id number]   cc:   Dr. [facility]  Fax # [phone]

## 2011-05-01 ENCOUNTER — Other Ambulatory Visit: Payer: Self-pay | Admitting: Family Medicine

## 2011-05-02 ENCOUNTER — Other Ambulatory Visit: Payer: Self-pay

## 2011-05-02 NOTE — Telephone Encounter (Signed)
Error

## 2011-05-18 ENCOUNTER — Other Ambulatory Visit: Payer: Self-pay | Admitting: Family Medicine

## 2011-05-18 MED ORDER — TAMSULOSIN HCL 0.4 MG PO CAPS: 0.4000 mg | ORAL_CAPSULE | ORAL | Status: DC

## 2011-09-22 ENCOUNTER — Other Ambulatory Visit: Payer: Self-pay

## 2011-09-22 MED ORDER — HYDROCODONE-ACETAMINOPHEN 10-650 MG PO TABS: ORAL_TABLET | ORAL | Status: DC

## 2011-09-22 NOTE — Telephone Encounter (Signed)
rx called into the pharmacy  

## 2011-12-14 ENCOUNTER — Ambulatory Visit: Payer: Medicare Other | Admitting: Internal Medicine

## 2011-12-15 ENCOUNTER — Ambulatory Visit (INDEPENDENT_AMBULATORY_CARE_PROVIDER_SITE_OTHER): Payer: Medicare Other | Admitting: Internal Medicine

## 2011-12-15 ENCOUNTER — Encounter: Payer: Self-pay | Admitting: Internal Medicine

## 2011-12-15 DIAGNOSIS — M545 Low back pain: Secondary | ICD-10-CM

## 2011-12-15 DIAGNOSIS — D649 Anemia, unspecified: Secondary | ICD-10-CM

## 2011-12-15 DIAGNOSIS — M25519 Pain in unspecified shoulder: Secondary | ICD-10-CM

## 2011-12-15 DIAGNOSIS — E785 Hyperlipidemia, unspecified: Secondary | ICD-10-CM

## 2011-12-15 DIAGNOSIS — R972 Elevated prostate specific antigen [PSA]: Secondary | ICD-10-CM

## 2011-12-15 DIAGNOSIS — Z79899 Other long term (current) drug therapy: Secondary | ICD-10-CM

## 2011-12-15 DIAGNOSIS — E039 Hypothyroidism, unspecified: Secondary | ICD-10-CM

## 2011-12-15 DIAGNOSIS — M25512 Pain in left shoulder: Secondary | ICD-10-CM | POA: Insufficient documentation

## 2011-12-15 DIAGNOSIS — I10 Essential (primary) hypertension: Secondary | ICD-10-CM

## 2011-12-15 LAB — HEPATIC FUNCTION PANEL
ALT: 11 U/L (ref 0–53)
AST: 15 U/L (ref 0–37)
Alkaline Phosphatase: 85 U/L (ref 39–117)
Bilirubin, Direct: 0.1 mg/dL (ref 0.0–0.3)
Indirect Bilirubin: 0.3 mg/dL (ref 0.0–0.9)

## 2011-12-15 LAB — BASIC METABOLIC PANEL
Chloride: 101 mEq/L (ref 96–112)
Potassium: 4.4 mEq/L (ref 3.5–5.3)

## 2011-12-15 LAB — LIPID PANEL
LDL Cholesterol: 79 mg/dL (ref 0–99)
VLDL: 37 mg/dL (ref 0–40)

## 2011-12-15 NOTE — Patient Instructions (Signed)
We will be scheduling physical therapy for your shoulder

## 2011-12-15 NOTE — Assessment & Plan Note (Signed)
Obtain psa 

## 2011-12-15 NOTE — Progress Notes (Signed)
  Subjective:    Patient ID: Daniel Gates, male    DOB: 1946/03/08, 66 y.o.   MRN: [id number]  HPI Pt presents to clinic for followup of multiple medical problems. Former pt of Dr. Scotty Court. bp elevated but states take benicar hct every other day. No side effects from the medication. Notes several week h/o right shoulder pain without injury/trauma. No improvement with daily mobic. H/o elevated psa and pt reports past urology consult with reportedly neg prostate bx. Suffers from chronic back pain maintained on chronic lorcet which he takes 1/2 tablet 1-2/week. No evidence of addiction or tolerance. No other complaints.  Past Medical History  Diagnosis Date  . Glaucoma   . Cancer     colon   . Depression   . Hypertension   . Allergic rhinitis    Past Surgical History  Procedure Date  . Hernia repair   . Colon surgery     reports that he has never smoked. He has never used smokeless tobacco. He reports that he does not drink alcohol or use illicit drugs. family history is not on file. No Known Allergies    Review of Systems see hpi     Objective:   Physical Exam  Constitutional: He appears well-developed and well-nourished. No distress.  HENT:  Head: Normocephalic and atraumatic.  Right Ear: External ear normal.  Left Ear: External ear normal.  Eyes: Conjunctivae are normal. No scleral icterus.  Neck: Neck supple.  Cardiovascular: Normal rate, regular rhythm and normal heart sounds.  Exam reveals no gallop and no friction rub.   No murmur heard. Pulmonary/Chest: Effort normal and breath sounds normal.  Musculoskeletal:       Right shoulder- FROM, NT. No crepitus.  Neurological: He is alert.  Skin: Skin is warm and dry. He is not diaphoretic.  Psychiatric: He has a normal mood and affect.          Assessment & Plan:

## 2011-12-15 NOTE — Assessment & Plan Note (Signed)
Failing nsaid. Schedule PT.

## 2011-12-15 NOTE — Assessment & Plan Note (Signed)
Stable. Discussed potential for addiction and/or tolerance with chronic narcotic use. Next RF recommend decrease of dose to 5mg .

## 2011-12-15 NOTE — Assessment & Plan Note (Signed)
Obtain cbc  

## 2011-12-15 NOTE — Assessment & Plan Note (Signed)
Maintained on statin tx. Obtain lipid/lft

## 2011-12-15 NOTE — Assessment & Plan Note (Signed)
Obtain tsh  

## 2011-12-15 NOTE — Assessment & Plan Note (Signed)
suboptimal control. Encouraged to take benicar hct daily and monitor bp for review.

## 2011-12-16 LAB — CBC
HCT: 44.4 % (ref 39.0–52.0)
Platelets: 182 10*3/uL (ref 150–400)
RDW: 15 % (ref 11.5–15.5)
WBC: 5.4 10*3/uL (ref 4.0–10.5)

## 2011-12-16 LAB — TSH: TSH: 3.648 u[IU]/mL (ref 0.350–4.500)

## 2011-12-20 ENCOUNTER — Ambulatory Visit: Payer: Medicare Other | Attending: Internal Medicine | Admitting: Physical Therapy

## 2011-12-20 DIAGNOSIS — M25519 Pain in unspecified shoulder: Secondary | ICD-10-CM | POA: Insufficient documentation

## 2011-12-20 DIAGNOSIS — IMO0001 Reserved for inherently not codable concepts without codable children: Secondary | ICD-10-CM | POA: Insufficient documentation

## 2011-12-20 DIAGNOSIS — M25619 Stiffness of unspecified shoulder, not elsewhere classified: Secondary | ICD-10-CM | POA: Insufficient documentation

## 2011-12-25 ENCOUNTER — Ambulatory Visit: Payer: Medicare Other | Admitting: Rehabilitation

## 2011-12-27 ENCOUNTER — Ambulatory Visit: Payer: Medicare Other | Admitting: Rehabilitation

## 2012-01-01 ENCOUNTER — Ambulatory Visit: Payer: Medicare Other | Admitting: Rehabilitation

## 2012-01-03 ENCOUNTER — Ambulatory Visit: Payer: Medicare Other | Admitting: Rehabilitation

## 2012-01-08 ENCOUNTER — Encounter: Payer: Medicare Other | Admitting: Rehabilitation

## 2012-01-10 ENCOUNTER — Encounter: Payer: Medicare Other | Admitting: Physical Therapy

## 2012-01-30 ENCOUNTER — Encounter: Payer: Self-pay | Admitting: Internal Medicine

## 2012-01-30 ENCOUNTER — Ambulatory Visit (INDEPENDENT_AMBULATORY_CARE_PROVIDER_SITE_OTHER): Payer: Medicare Other | Admitting: Internal Medicine

## 2012-01-30 ENCOUNTER — Telehealth: Payer: Self-pay | Admitting: Internal Medicine

## 2012-01-30 VITALS — BP 114/80 | HR 73 | Temp 98.0°F | Resp 18 | Ht 72.0 in | Wt 221.0 lb

## 2012-01-30 DIAGNOSIS — Z79899 Other long term (current) drug therapy: Secondary | ICD-10-CM

## 2012-01-30 DIAGNOSIS — E785 Hyperlipidemia, unspecified: Secondary | ICD-10-CM

## 2012-01-30 DIAGNOSIS — R202 Paresthesia of skin: Secondary | ICD-10-CM

## 2012-01-30 DIAGNOSIS — R209 Unspecified disturbances of skin sensation: Secondary | ICD-10-CM

## 2012-01-30 DIAGNOSIS — N529 Male erectile dysfunction, unspecified: Secondary | ICD-10-CM

## 2012-01-30 DIAGNOSIS — M25519 Pain in unspecified shoulder: Secondary | ICD-10-CM

## 2012-01-30 DIAGNOSIS — Z23 Encounter for immunization: Secondary | ICD-10-CM

## 2012-01-30 MED ORDER — METHYLPREDNISOLONE 4 MG PO KIT: PACK | ORAL | Status: AC

## 2012-01-30 NOTE — Patient Instructions (Signed)
Please schedule fasting labs prior to next visit  chem7-v58.69, lipid/lft 272.4, testosterone -ED

## 2012-01-31 NOTE — Telephone Encounter (Signed)
Lab orders entered for July 2013. 

## 2012-02-02 ENCOUNTER — Telehealth: Payer: Self-pay | Admitting: Internal Medicine

## 2012-02-02 NOTE — Telephone Encounter (Signed)
For the patient to be able to get the vacuum system paid for by [employer] complete he must have a statement from Dr Rodena Medin stating he has erictile dysfuntion and that he prescribed the ved systym.

## 2012-02-02 NOTE — Telephone Encounter (Signed)
That's what the form i signed said

## 2012-02-03 DIAGNOSIS — R202 Paresthesia of skin: Secondary | ICD-10-CM | POA: Insufficient documentation

## 2012-02-03 DIAGNOSIS — N529 Male erectile dysfunction, unspecified: Secondary | ICD-10-CM | POA: Insufficient documentation

## 2012-02-03 NOTE — Assessment & Plan Note (Signed)
Improved. Continue PT exercises

## 2012-02-03 NOTE — Progress Notes (Signed)
  Subjective:    Patient ID: Daniel Gates, male    DOB: 19-Dec-1945, 66 y.o.   MRN: [id number]  HPI Pt presents to clinic for followup of multiple medical problems. Notes improvement of right shoulder pain with PT. C/o ED difficulty initiating or sustain an erection. Has intact libido. Wants to pursue vacuum pump. C/o intermittent left arm tingling/numbness without injury or arm weakness. No neck or chest pain. No alleviating or exacerbating factors. No other complaints.  Past Medical History  Diagnosis Date  . Glaucoma   . Cancer     colon   . Depression   . Hypertension   . Allergic rhinitis    Past Surgical History  Procedure Date  . Hernia repair   . Colon surgery     reports that he has never smoked. He has never used smokeless tobacco. He reports that he does not drink alcohol or use illicit drugs. family history is not on file. No Known Allergies    Review of Systems see hpi     Objective:   Physical Exam  Physical Exam  Nursing note and vitals reviewed. Constitutional: Appears well-developed and well-nourished. No distress.  HENT:  Head: Normocephalic and atraumatic.  Right Ear: External ear normal.  Left Ear: External ear normal.  Eyes: Conjunctivae are normal. No scleral icterus.  Neck: Neck supple. Carotid bruit is not present.  Cardiovascular: Normal rate, regular rhythm and normal heart sounds.  Exam reveals no gallop and no friction rub.   No murmur heard. Pulmonary/Chest: Effort normal and breath sounds normal. No respiratory distress. He has no wheezes. no rales.  Lymphadenopathy:    He has no cervical adenopathy.  Neurological:Alert.  Skin: Skin is warm and dry. Not diaphoretic.  Psychiatric: Has a normal mood and affect.  MSK: FROM left arm and neck. Distal strength 5/5.       Assessment & Plan:

## 2012-02-03 NOTE — Assessment & Plan Note (Signed)
Consider possible nerve impingement. Exam nl. Attempt medrol dosepak. Followup if no improvement or worsening.

## 2012-02-03 NOTE — Assessment & Plan Note (Signed)
Paperwork completed for vacuum pump

## 2012-02-05 NOTE — Telephone Encounter (Signed)
Can copy my note as well.

## 2012-02-05 NOTE — Telephone Encounter (Signed)
Office note from 02/03/12 faxed to [facility] at [phone].

## 2012-02-12 ENCOUNTER — Encounter: Payer: Self-pay | Admitting: Gastroenterology

## 2012-02-20 ENCOUNTER — Other Ambulatory Visit: Payer: Self-pay | Admitting: Family Medicine

## 2012-02-20 NOTE — Telephone Encounter (Signed)
Rx refills sent to pharmacy. 

## 2012-03-25 ENCOUNTER — Telehealth: Payer: Self-pay | Admitting: Internal Medicine

## 2012-03-25 ENCOUNTER — Encounter: Payer: Self-pay | Admitting: Gastroenterology

## 2012-03-25 NOTE — Telephone Encounter (Signed)
#  90 rf 6

## 2012-03-26 ENCOUNTER — Telehealth: Payer: Self-pay | Admitting: Internal Medicine

## 2012-03-26 MED ORDER — INDOMETHACIN ER 75 MG PO CPCR: 75.0000 mg | ORAL_CAPSULE | Freq: Three times a day (TID) | ORAL | Status: DC | PRN

## 2012-03-26 MED ORDER — MELOXICAM 15 MG PO TABS: 15.0000 mg | ORAL_TABLET | Freq: Every day | ORAL | Status: DC

## 2012-03-26 NOTE — Telephone Encounter (Signed)
Refill- promethazine hcl oral tablet 25mg . Take one tablet every 4 hours as needed for nausea. Qty 60 last fill 3.19.13

## 2012-03-26 NOTE — Telephone Encounter (Signed)
Don't prescribe phenergan long term. Short for nausea.  Ok for [employer] with rf 6 In oct pt told me was taking 1/2 tablet of hydrocodone 1-2 times per week. If that was correct then i am confused as to why he is out. Also we discussed decreasing the dose to 5mg 

## 2012-03-26 NOTE — Telephone Encounter (Signed)
Refill-hydrocodone/acetaminophen 10-650mg    Refill- mobic oral tablet 15mg . Take one tablet once daily for arthritis. Qty 30 last fill 3.19.13

## 2012-03-26 NOTE — Telephone Encounter (Signed)
Call placed to pharmacy at [phone], spoke with Liborio Nixon she was informed Rx for phenergan and hydrocodone was denied. She was advised to have patient contact office.  Call placed to patient at [phone] stated he was contacted by pharmacy stating he would be out of the medication, and they wanted to know if he needed the refills. Patient was asked if he is currently of this medication. He stated that he is not out. He was advised if refills were needed to contact office when he is out. He has verbalized understanding and agrees.  Rx for Mobic sent to pharmacy.

## 2012-03-26 NOTE — Telephone Encounter (Signed)
Rx refill sent to pharmacy. 

## 2012-04-30 ENCOUNTER — Telehealth: Payer: Self-pay | Admitting: Internal Medicine

## 2012-04-30 MED ORDER — ESCITALOPRAM OXALATE 10 MG PO TABS: 10.0000 mg | ORAL_TABLET | Freq: Every day | ORAL | Status: DC

## 2012-04-30 NOTE — Telephone Encounter (Signed)
lexapro 10mg  #30 x 2 refills sent to pharmacy.

## 2012-05-14 ENCOUNTER — Encounter: Payer: Self-pay | Admitting: Gastroenterology

## 2012-05-14 ENCOUNTER — Ambulatory Visit (AMBULATORY_SURGERY_CENTER): Payer: Medicare Other | Admitting: *Deleted

## 2012-05-14 VITALS — Ht 72.0 in | Wt 216.3 lb

## 2012-05-14 DIAGNOSIS — Z85038 Personal history of other malignant neoplasm of large intestine: Secondary | ICD-10-CM

## 2012-05-14 DIAGNOSIS — Z1211 Encounter for screening for malignant neoplasm of colon: Secondary | ICD-10-CM

## 2012-05-14 MED ORDER — MOVIPREP 100 G PO SOLR: ORAL | Status: DC

## 2012-05-21 ENCOUNTER — Other Ambulatory Visit: Payer: Self-pay | Admitting: Family Medicine

## 2012-05-23 ENCOUNTER — Other Ambulatory Visit: Payer: Self-pay | Admitting: Family Medicine

## 2012-05-23 NOTE — Telephone Encounter (Signed)
Rx refill sent to pharmacy. 

## 2012-05-28 ENCOUNTER — Encounter: Payer: Self-pay | Admitting: Gastroenterology

## 2012-05-28 ENCOUNTER — Ambulatory Visit (AMBULATORY_SURGERY_CENTER): Payer: Medicare Other | Admitting: Gastroenterology

## 2012-05-28 VITALS — BP 137/80 | HR 56 | Temp 96.7°F | Resp 20 | Ht 72.0 in | Wt 216.0 lb

## 2012-05-28 DIAGNOSIS — D126 Benign neoplasm of colon, unspecified: Secondary | ICD-10-CM

## 2012-05-28 DIAGNOSIS — Z85038 Personal history of other malignant neoplasm of large intestine: Secondary | ICD-10-CM

## 2012-05-28 DIAGNOSIS — Z1211 Encounter for screening for malignant neoplasm of colon: Secondary | ICD-10-CM

## 2012-05-28 MED ORDER — SODIUM CHLORIDE 0.9 % IV SOLN: 500.0000 mL | INTRAVENOUS | Status: DC

## 2012-05-28 NOTE — Patient Instructions (Addendum)
YOU HAD AN ENDOSCOPIC PROCEDURE TODAY AT THE [facility]: Refer to the procedure report that was given to you for any specific questions about what was found during the examination.  If the procedure report does not answer your questions, please call your gastroenterologist to clarify.  If you requested that your care partner not be given the details of your procedure findings, then the procedure report has been included in a sealed envelope for you to review at your convenience later.  YOU SHOULD EXPECT: Some feelings of bloating in the abdomen. Passage of more gas than usual.  Walking can help get rid of the air that was put into your GI tract during the procedure and reduce the bloating. If you had a lower endoscopy (such as a colonoscopy or flexible sigmoidoscopy) you may notice spotting of blood in your stool or on the toilet paper. If you underwent a bowel prep for your procedure, then you may not have a normal bowel movement for a few days.  DIET: Your first meal following the procedure should be a light meal and then it is ok to progress to your normal diet.  A half-sandwich or bowl of soup is an example of a good first meal.  Heavy or fried foods are harder to digest and may make you feel nauseous or bloated.  Likewise meals heavy in dairy and vegetables can cause extra gas to form and this can also increase the bloating.  Drink plenty of fluids but you should avoid alcoholic beverages for 24 hours.  ACTIVITY: Your care partner should take you home directly after the procedure.  You should plan to take it easy, moving slowly for the rest of the day.  You can resume normal activity the day after the procedure however you should NOT DRIVE or use heavy machinery for 24 hours (because of the sedation medicines used during the test).    SYMPTOMS TO REPORT IMMEDIATELY: A gastroenterologist can be reached at any hour.  During normal business hours, 8:30 AM to 5:00 PM Monday through Friday,  call [phone].  After hours and on weekends, please call the GI answering service at [phone] who will take a message and have the physician on call contact you.   Following lower endoscopy (colonoscopy or flexible sigmoidoscopy):  Excessive amounts of blood in the stool  Significant tenderness or worsening of abdominal pains  Swelling of the abdomen that is new, acute  Fever of 100F or higher  Following upper endoscopy (EGD)  Vomiting of blood or coffee ground material  New chest pain or pain under the shoulder blades  Painful or persistently difficult swallowing  New shortness of breath  Fever of 100F or higher  Black, tarry-looking stools  FOLLOW UP: If any biopsies were taken you will be contacted by phone or by letter within the next 1-3 weeks.  Call your gastroenterologist if you have not heard about the biopsies in 3 weeks.  Our staff will call the home number listed on your records the next business day following your procedure to check on you and address any questions or concerns that you may have at that time regarding the information given to you following your procedure. This is a courtesy call and so if there is no answer at the home number and we have not heard from you through the emergency physician on call, we will assume that you have returned to your regular daily activities without incident.  SIGNATURES/CONFIDENTIALITY: You and/or your care   partner have signed paperwork which will be entered into your electronic medical record.  These signatures attest to the fact that that the information above on your After Visit Summary has been reviewed and is understood.  Full responsibility of the confidentiality of this discharge information lies with you and/or your care-partner.  

## 2012-05-28 NOTE — Op Note (Signed)
[facility] [address] [place], [place]  [address]  COLONOSCOPY PROCEDURE REPORT  PATIENT:  Daniel Gates, Daniel Gates  MR#:  [id number] BIRTHDATE:  31-Jul-1946, 66 yrs. 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 Doreatha Martin in 2008 found no polyps MEDICATIONS:   Fentanyl 75 mcg IV, These medications were titrated to patient response per physician's verbal order, Versed 8 mg IV  DESCRIPTION OF PROCEDURE:   After the risks benefits and alternatives of the procedure were thoroughly explained, informed consent was obtained.  Digital rectal exam was performed and revealed no rectal masses.   The LB [id number]-[id number] endoscope was introduced through the anus and advanced to the cecum, which was identified by both the appendix and ileocecal valve, without limitations.  The quality of the prep was good..  The instrument was then slowly withdrawn as the colon was fully examined. <<PROCEDUREIMAGES>> FINDINGS:  There were two small sessile polyps (3-76mm each) removed from ascending colon with cold snare, sent to pathology (jar 1) (see image4).  There was a surgical anastomosis. The transverse colon anastomosis was normal (see image1).  This was otherwise a normal examination of the colon (see image2, image3, and image6).   Retroflexed views in the rectum revealed no abnormalities. COMPLICATIONS:  None  ENDOSCOPIC IMPRESSION: 1) Two small polyps removed and sent to pathology 2) Normal appearing anastomosis (2003 segmental colectomy for cancer) 3) Otherwise normal examination  RECOMMENDATIONS: 1) Given your personal history of colon cancer, you will need a repeat colonoscopy in 5 years even if the polyps removed today are NOT precancerous. 2) You will receive a letter within 1-2 weeks with  the results of your biopsy as well as final recommendations. Please call my office if you have not received a letter after 3 weeks.  ______________________________ Rachael Fee, MD  n. eSIGNED:   Rachael Fee at 05/28/2012 11:44 AM  Shann Medal, [phone]

## 2012-05-29 ENCOUNTER — Telehealth: Payer: Self-pay | Admitting: *Deleted

## 2012-05-29 NOTE — Telephone Encounter (Signed)
  Follow up Call-  Call back number 05/28/2012  Post procedure Call Back phone  # [phone]  Permission to leave phone message Yes     Patient questions:  Do you have a fever, pain , or abdominal swelling? no Pain Score  0 *  Have you tolerated food without any problems? yes  Have you been able to return to your normal activities? yes  Do you have any questions about your discharge instructions: Diet   no Medications  no Follow up visit  no  Do you have questions or concerns about your Care? no  Actions: * If pain score is 4 or above: No action needed, pain <4.

## 2012-06-04 ENCOUNTER — Encounter: Payer: Self-pay | Admitting: Gastroenterology

## 2012-06-19 LAB — BASIC METABOLIC PANEL
Calcium: 9.7 mg/dL (ref 8.4–10.5)
Glucose, Bld: 145 mg/dL — ABNORMAL HIGH (ref 70–99)
Potassium: 4.4 mEq/L (ref 3.5–5.3)
Sodium: 136 mEq/L (ref 135–145)

## 2012-06-19 LAB — TESTOSTERONE: Testosterone: 415.42 ng/dL (ref 300–890)

## 2012-06-19 NOTE — Addendum Note (Signed)
Addended by: Regis Bill on: 06/19/2012 11:45 AM   Modules accepted: Orders

## 2012-06-20 LAB — LIPID PANEL
Cholesterol: 179 mg/dL (ref 0–200)
HDL: 37 mg/dL — ABNORMAL LOW (ref >39)
Total CHOL/HDL Ratio: 4.8 Ratio
Triglycerides: 324 mg/dL — ABNORMAL HIGH (ref <150)
VLDL: 65 mg/dL — ABNORMAL HIGH (ref 0–40)

## 2012-06-20 LAB — HEPATIC FUNCTION PANEL
ALT: 15 U/L (ref 0–53)
AST: 17 U/L (ref 0–37)
Albumin: 4.2 g/dL (ref 3.5–5.2)
Bilirubin, Direct: 0.1 mg/dL (ref 0.0–0.3)
Total Protein: 7.5 g/dL (ref 6.0–8.3)

## 2012-06-27 ENCOUNTER — Encounter: Payer: Self-pay | Admitting: Internal Medicine

## 2012-06-27 ENCOUNTER — Ambulatory Visit (INDEPENDENT_AMBULATORY_CARE_PROVIDER_SITE_OTHER): Payer: Medicare Other | Admitting: Internal Medicine

## 2012-06-27 VITALS — BP 124/82 | HR 60 | Temp 98.0°F | Resp 16 | Ht 71.0 in | Wt 217.5 lb

## 2012-06-27 DIAGNOSIS — I10 Essential (primary) hypertension: Secondary | ICD-10-CM

## 2012-06-27 DIAGNOSIS — R799 Abnormal finding of blood chemistry, unspecified: Secondary | ICD-10-CM

## 2012-06-27 DIAGNOSIS — R252 Cramp and spasm: Secondary | ICD-10-CM

## 2012-06-27 DIAGNOSIS — E119 Type 2 diabetes mellitus without complications: Secondary | ICD-10-CM | POA: Insufficient documentation

## 2012-06-27 DIAGNOSIS — R7309 Other abnormal glucose: Secondary | ICD-10-CM

## 2012-06-27 DIAGNOSIS — R7989 Other specified abnormal findings of blood chemistry: Secondary | ICD-10-CM | POA: Insufficient documentation

## 2012-06-27 DIAGNOSIS — R739 Hyperglycemia, unspecified: Secondary | ICD-10-CM

## 2012-06-27 HISTORY — DX: Type 2 diabetes mellitus without complications: E11.9

## 2012-06-27 LAB — BASIC METABOLIC PANEL
CO2: 28 mEq/L (ref 19–32)
Calcium: 9.7 mg/dL (ref 8.4–10.5)
Creat: 1.51 mg/dL — ABNORMAL HIGH (ref 0.50–1.35)
Sodium: 135 mEq/L (ref 135–145)

## 2012-06-27 LAB — HEMOGLOBIN A1C: Hgb A1c MFr Bld: 6.4 % — ABNORMAL HIGH (ref <5.7)

## 2012-06-27 NOTE — Assessment & Plan Note (Signed)
Attempt otc mag oxide qd. Followup if no improvement or worsening.

## 2012-06-27 NOTE — Progress Notes (Signed)
  Subjective:    Patient ID: Daniel Gates, male    DOB: 1946/08/03, 66 y.o.   MRN: [id number]  HPI Pt presents to clinic for followup of multiple medical problems. Notes intermittent leg and feet cramps more noticeable at night. Sx's helped by eating a banana. Reviewed chem7 with nl K but mildly elevated cr 1.51. Glucose elevated 145 but states was not fasting. BP reviewed normotensive.   Past Medical History  Diagnosis Date  . Glaucoma   . Cancer     colon ;diagnosed 2003  . Depression   . Hypertension   . Allergic rhinitis   . Hyperlipidemia   . Gout    Past Surgical History  Procedure Date  . Inguinal hernia repair 1997    right  . Colon surgery 2003    for colon cancer  . Colonoscopy     reports that he has never smoked. He has never used smokeless tobacco. He reports that he does not drink alcohol or use illicit drugs. family history is negative for Colon cancer, and Stomach cancer, and Esophageal cancer, and Rectal cancer, . No Known Allergies    Review of Systems see hpi     Objective:   Physical Exam  Physical Exam  Nursing note and vitals reviewed. Constitutional: Appears well-developed and well-nourished. No distress.  HENT:  Head: Normocephalic and atraumatic.  Right Ear: External ear normal.  Left Ear: External ear normal.  Eyes: Conjunctivae are normal. No scleral icterus.  Neck: Neck supple. Carotid bruit is not present.  Cardiovascular: Normal rate, regular rhythm and normal heart sounds.  Exam reveals no gallop and no friction rub.   No murmur heard. Pulmonary/Chest: Effort normal and breath sounds normal. No respiratory distress. He has no wheezes. no rales.  Lymphadenopathy:    He has no cervical adenopathy.  Neurological:Alert.  Skin: Skin is warm and dry. Not diaphoretic.  Psychiatric: Has a normal mood and affect.        Assessment & Plan:

## 2012-06-27 NOTE — Assessment & Plan Note (Signed)
Normotensive and stable. Continue current regimen. Monitor bp as outpt and followup in clinic as scheduled.  

## 2012-06-27 NOTE — Assessment & Plan Note (Signed)
Repeat chem7 

## 2012-06-27 NOTE — Assessment & Plan Note (Signed)
Obtain a1c.  

## 2012-06-27 NOTE — Patient Instructions (Signed)
Please try over the counter magnesium oxide one a day for muscle cramps Schedule fasting labs prior to next visit- chem7-v58.69, lipid-272.4, tsh-hypothyroidism, uric acid-gout

## 2012-07-19 ENCOUNTER — Telehealth: Payer: Self-pay | Admitting: *Deleted

## 2012-07-19 DIAGNOSIS — R7309 Other abnormal glucose: Secondary | ICD-10-CM

## 2012-07-19 DIAGNOSIS — R799 Abnormal finding of blood chemistry, unspecified: Secondary | ICD-10-CM

## 2012-07-19 MED ORDER — OLMESARTAN MEDOXOMIL 40 MG PO TABS: 40.0000 mg | ORAL_TABLET | Freq: Every day | ORAL | Status: DC

## 2012-07-19 NOTE — Telephone Encounter (Signed)
Message copied by Regis Bill on Fri Jul 19, 2012  8:55 AM ------      Message from: Edwyna Perfect      Created: Sat Jun 29, 2012  8:37 AM       Kidney test remains mildly elevated. Would recommend changing benicar hct to benicar(same dose just without the hctz) when runs out. Also would try to reduce mobic to 1/2 dose (7.5mg ) and try and limit use of mobic and indocin if possible. Blood sugar avg is high-leaning towards diabetes. Recommend low sugar/carb diet, exercise and wt loss. pls make sure has chem7, a1c no later than 3months from now

## 2012-07-19 NOTE — Telephone Encounter (Signed)
Patient informed, understood & agreed; new Rx to pharmacy, lab orders entered/SLS

## 2012-07-20 ENCOUNTER — Other Ambulatory Visit: Payer: Self-pay | Admitting: Internal Medicine

## 2012-07-20 ENCOUNTER — Other Ambulatory Visit: Payer: Self-pay | Admitting: Gastroenterology

## 2012-09-13 ENCOUNTER — Telehealth: Payer: Self-pay | Admitting: Internal Medicine

## 2012-09-13 MED ORDER — DUTASTERIDE 0.5 MG PO CAPS: 0.5000 mg | ORAL_CAPSULE | Freq: Every day | ORAL | Status: DC

## 2012-09-13 NOTE — Telephone Encounter (Signed)
Refill- avodart 0.5mg  cap. Take one capsule once daily. Qty 30 last fill 9.13.13

## 2012-09-17 ENCOUNTER — Telehealth: Payer: Self-pay | Admitting: Internal Medicine

## 2012-09-17 MED ORDER — SIMVASTATIN 20 MG PO TABS: ORAL_TABLET | ORAL | Status: DC

## 2012-09-17 NOTE — Telephone Encounter (Signed)
Zocor done; Avodart last Rx 10.11.13 #30x6--too soon for refill/SLS

## 2012-09-17 NOTE — Telephone Encounter (Signed)
Refill zocor oral tablet 20 mg qty 30 last fill 08-16-12

## 2012-09-17 NOTE — Telephone Encounter (Signed)
Refill avodart oral capsule 0.5mg  take 1 capsule daily last fill 08-16-2012

## 2012-10-12 ENCOUNTER — Other Ambulatory Visit: Payer: Self-pay | Admitting: Internal Medicine

## 2012-10-14 NOTE — Telephone Encounter (Signed)
LMOM with contact name & number for return call, if needed, reiterating provider instructions from last labs; wants pt to avoid NSAIDS d/t abnormal [kidney] lab results, please use Indocin sparingly, as well; deny Rx request/SLS

## 2012-10-14 NOTE — Telephone Encounter (Signed)
Meloxicam request [Last Rx 04.23.13 #30x6]/SLS Please advise.

## 2012-10-14 NOTE — Telephone Encounter (Signed)
Below is last lab result note. With elevated kidney test need to avoid nsaids as much as possible if not altogether.  "Kidney test remains mildly elevated. Would recommend changing benicar hct to benicar(same dose just without the hctz) when runs out. Also would try to reduce mobic to 1/2 dose (7.5mg ) and try and limit use of mobic and indocin if possible. Blood sugar avg is high-leaning towards diabetes. Recommend low sugar/carb diet, exercise and wt loss. pls make sure has chem7, a1c no later than 3months from now"

## 2012-11-11 ENCOUNTER — Other Ambulatory Visit: Payer: Self-pay | Admitting: Internal Medicine

## 2012-11-11 NOTE — Telephone Encounter (Signed)
benicar ok RF4. mobic no. See last chem7 result note. Elevated creatinine. Advised to minimize and preferably stop nsaid use. Was supposed to return to lab for recheck chem7 and a1c

## 2012-11-11 NOTE — Telephone Encounter (Signed)
Pt last filled #30 Meloxicam on 09/12/12.  Please advise re: additional refills.

## 2012-11-12 LAB — BASIC METABOLIC PANEL
BUN: 13 mg/dL (ref 6–23)
CO2: 26 mEq/L (ref 19–32)
Calcium: 9.9 mg/dL (ref 8.4–10.5)
Chloride: 99 mEq/L (ref 96–112)
Creat: 1.49 mg/dL — ABNORMAL HIGH (ref 0.50–1.35)
Glucose, Bld: 101 mg/dL — ABNORMAL HIGH (ref 70–99)

## 2012-11-12 NOTE — Telephone Encounter (Signed)
Refill sent for Benicar and denial sent for Meloxicam. Will notify pt tomorrow.

## 2012-11-12 NOTE — Addendum Note (Signed)
Addended by: Candie Echevaria L on: 11/12/2012 01:35 PM   Modules accepted: Orders

## 2012-11-18 NOTE — Telephone Encounter (Signed)
Notified pt and he voices understanding. Labs were completed on 11/12/12. Has f/u this week.

## 2012-11-21 ENCOUNTER — Ambulatory Visit (INDEPENDENT_AMBULATORY_CARE_PROVIDER_SITE_OTHER): Payer: Medicare Other | Admitting: Internal Medicine

## 2012-11-21 ENCOUNTER — Encounter: Payer: Self-pay | Admitting: Internal Medicine

## 2012-11-21 VITALS — BP 134/90 | HR 68 | Temp 98.3°F | Resp 16 | Ht 71.0 in | Wt 218.0 lb

## 2012-11-21 DIAGNOSIS — G2581 Restless legs syndrome: Secondary | ICD-10-CM | POA: Insufficient documentation

## 2012-11-21 DIAGNOSIS — I10 Essential (primary) hypertension: Secondary | ICD-10-CM

## 2012-11-21 DIAGNOSIS — R7309 Other abnormal glucose: Secondary | ICD-10-CM

## 2012-11-21 DIAGNOSIS — R799 Abnormal finding of blood chemistry, unspecified: Secondary | ICD-10-CM

## 2012-11-21 DIAGNOSIS — R7989 Other specified abnormal findings of blood chemistry: Secondary | ICD-10-CM

## 2012-11-21 DIAGNOSIS — R739 Hyperglycemia, unspecified: Secondary | ICD-10-CM

## 2012-11-21 MED ORDER — ROPINIROLE HCL 0.5 MG PO TABS: 0.5000 mg | ORAL_TABLET | Freq: Every day | ORAL | Status: DC

## 2012-11-21 NOTE — Progress Notes (Signed)
  Subjective:    Patient ID: Daniel Gates, male    DOB: 01/22/1946, 66 y.o.   MRN: [id number]  HPI Pt presents to clinic for followup of multiple medical problems. Notes 2 month h/o intermittent bilateral horizontal? Field cuts now resolved. Denies sx of amarosis fugax and no associated neurologic sx's. Saw his eye doctor with reportedly nl exam. Sx's have not returned. No known pituitary abnormality. Reviewed stable mildly elevated creatinine and a1c. Has been avoiding nsaids recently. Declines pneumovax and flu vaccine. Continues to have nocturnal bilateral leg cramps and feeling of need to move them. Did not improve with daily magnesium oxide.   Past Medical History  Diagnosis Date  . Glaucoma   . Cancer     colon ;diagnosed 2003  . Depression   . Hypertension   . Allergic rhinitis   . Hyperlipidemia   . Gout    Past Surgical History  Procedure Date  . Inguinal hernia repair 1997    right  . Colon surgery 2003    for colon cancer  . Colonoscopy     reports that he has never smoked. He has never used smokeless tobacco. He reports that he does not drink alcohol or use illicit drugs. family history is negative for Colon cancer, and Stomach cancer, and Esophageal cancer, and Rectal cancer, . No Known Allergies    Review of Systems see hpi     Objective:   Physical Exam  Eyes:       Peripheral vision intact without field cuts     Physical Exam  Nursing note and vitals reviewed. Constitutional: Appears well-developed and well-nourished. No distress.  HENT:  Head: Normocephalic and atraumatic.  Right Ear: External ear normal.  Left Ear: External ear normal.  Eyes: Conjunctivae are normal. No scleral icterus.  Neck: Neck supple. Carotid bruit is not present.  Cardiovascular: Normal rate, regular rhythm and normal heart sounds.  Exam reveals no gallop and no friction rub.   No murmur heard. Pulmonary/Chest: Effort normal and breath sounds normal. No respiratory distress.  He has no wheezes. no rales.  Lymphadenopathy:    He has no cervical adenopathy.  Neurological:Alert.  Skin: Skin is warm and dry. Not diaphoretic.  Psychiatric: Has a normal mood and affect.        Assessment & Plan:

## 2012-11-21 NOTE — Assessment & Plan Note (Signed)
Mild elevation and stable. Avoid nsaids. Repeat chem7 with next visit

## 2012-11-21 NOTE — Assessment & Plan Note (Signed)
Isolated mild elevation today. Recommend outpt bp log to be called to clinic in several weeks.

## 2012-11-21 NOTE — Patient Instructions (Signed)
Please call back in a few weeks with a report of what your blood pressure has been running.  Schedule fasting labs before your next visit-cbc, chem7-renal insufficiency, a1c-hyperglycemia, and lipid/lft-272.4

## 2012-11-21 NOTE — Assessment & Plan Note (Signed)
Mild and stable. Attention to low sugar/carb diet.

## 2012-11-21 NOTE — Assessment & Plan Note (Signed)
Attempt requip qhs. Followup if no improvement or worsening.

## 2013-01-04 ENCOUNTER — Other Ambulatory Visit: Payer: Self-pay | Admitting: Internal Medicine

## 2013-02-11 ENCOUNTER — Telehealth: Payer: Self-pay

## 2013-02-11 ENCOUNTER — Telehealth: Payer: Self-pay | Admitting: Internal Medicine

## 2013-02-11 DIAGNOSIS — R739 Hyperglycemia, unspecified: Secondary | ICD-10-CM

## 2013-02-11 DIAGNOSIS — N289 Disorder of kidney and ureter, unspecified: Secondary | ICD-10-CM

## 2013-02-11 LAB — CBC
Hemoglobin: 14.3 g/dL (ref 13.0–17.0)
MCH: 25.7 pg — ABNORMAL LOW (ref 26.0–34.0)
Platelets: 178 10*3/uL (ref 150–400)
RBC: 5.56 MIL/uL (ref 4.22–5.81)
WBC: 4.9 10*3/uL (ref 4.0–10.5)

## 2013-02-11 LAB — LIPID PANEL
Cholesterol: 148 mg/dL (ref 0–200)
HDL: 31 mg/dL — ABNORMAL LOW (ref >39)
LDL Cholesterol: 56 mg/dL (ref 0–99)
Total CHOL/HDL Ratio: 4.8 Ratio
Triglycerides: 303 mg/dL — ABNORMAL HIGH (ref <150)
VLDL: 61 mg/dL — ABNORMAL HIGH (ref 0–40)

## 2013-02-11 LAB — BASIC METABOLIC PANEL
CO2: 26 mEq/L (ref 19–32)
Chloride: 103 mEq/L (ref 96–112)
Glucose, Bld: 105 mg/dL — ABNORMAL HIGH (ref 70–99)
Potassium: 4.5 mEq/L (ref 3.5–5.3)
Sodium: 138 mEq/L (ref 135–145)

## 2013-02-11 LAB — HEPATIC FUNCTION PANEL
ALT: 15 U/L (ref 0–53)
Albumin: 3.8 g/dL (ref 3.5–5.2)
Indirect Bilirubin: 0.2 mg/dL (ref 0.0–0.9)
Total Protein: 7.2 g/dL (ref 6.0–8.3)

## 2013-02-11 MED ORDER — ESCITALOPRAM OXALATE 10 MG PO TABS: 10.0000 mg | ORAL_TABLET | Freq: Every day | ORAL | Status: DC

## 2013-02-11 NOTE — Telephone Encounter (Signed)
Labs ordered.

## 2013-02-11 NOTE — Telephone Encounter (Signed)
ESCITALOPRAM OSALATE 10 MG TAB MYLA QTY 30 TAKE 1 TABLET ONCE DAILY LAST FILL 02-19-2013

## 2013-02-12 NOTE — Telephone Encounter (Signed)
Quick Note:  Patient Informed and voiced understanding ______ 

## 2013-02-18 ENCOUNTER — Ambulatory Visit: Payer: Medicare Other | Admitting: Family Medicine

## 2013-03-11 ENCOUNTER — Other Ambulatory Visit: Payer: Self-pay | Admitting: Internal Medicine

## 2013-03-11 NOTE — Telephone Encounter (Signed)
Rx request to pharmacy/SLS  

## 2013-03-13 ENCOUNTER — Ambulatory Visit (INDEPENDENT_AMBULATORY_CARE_PROVIDER_SITE_OTHER): Payer: Medicare Other | Admitting: Family Medicine

## 2013-03-13 ENCOUNTER — Encounter: Payer: Self-pay | Admitting: Family Medicine

## 2013-03-13 VITALS — BP 122/86 | HR 68 | Temp 98.3°F | Resp 16 | Wt 219.1 lb

## 2013-03-13 DIAGNOSIS — E785 Hyperlipidemia, unspecified: Secondary | ICD-10-CM

## 2013-03-13 DIAGNOSIS — E039 Hypothyroidism, unspecified: Secondary | ICD-10-CM

## 2013-03-13 DIAGNOSIS — I10 Essential (primary) hypertension: Secondary | ICD-10-CM

## 2013-03-13 DIAGNOSIS — Z85038 Personal history of other malignant neoplasm of large intestine: Secondary | ICD-10-CM

## 2013-03-13 DIAGNOSIS — R109 Unspecified abdominal pain: Secondary | ICD-10-CM

## 2013-03-13 MED ORDER — DICYCLOMINE HCL 20 MG PO TABS: 20.0000 mg | ORAL_TABLET | Freq: Four times a day (QID) | ORAL | Status: DC

## 2013-03-13 NOTE — Patient Instructions (Addendum)
Start a fiber supplement daily Start a probiotic such as ALign, Digestive Advantage or a generic Drink 64 oz of clear fluids  Labs prior to visit, lipid, renal, cbc, tsh, hepatic, hgba1c, h pylori   Diabetes Meal Planning Guide The diabetes meal planning guide is a tool to help you plan your meals and snacks. It is important for people with diabetes to manage their blood glucose (sugar) levels. Choosing the right foods and the right amounts throughout your day will help control your blood glucose. Eating right can even help you improve your blood pressure and reach or maintain a healthy weight. CARBOHYDRATE COUNTING MADE EASY When you eat carbohydrates, they turn to sugar. This raises your blood glucose level. Counting carbohydrates can help you control this level so you feel better. When you plan your meals by counting carbohydrates, you can have more flexibility in what you eat and balance your medicine with your food intake. Carbohydrate counting simply means adding up the total amount of carbohydrate grams in your meals and snacks. Try to eat about the same amount at each meal. Foods with carbohydrates are listed below. Each portion below is 1 carbohydrate serving or 15 grams of carbohydrates. Ask your dietician how many grams of carbohydrates you should eat at each meal or snack. Grains and Starches  1 slice bread.   English muffin or hotdog/hamburger bun.   cup cold cereal (unsweetened).   cup cooked pasta or rice.   cup starchy vegetables (corn, potatoes, peas, beans, winter squash).  1 tortilla (6 inches).   bagel.  1 waffle or pancake (size of a CD).   cup cooked cereal.  4 to 6 small crackers. *Whole grain is recommended. Fruit  1 cup fresh unsweetened berries, melon, papaya, pineapple.  1 small fresh fruit.   banana or mango.   cup fruit juice (4 oz unsweetened).   cup canned fruit in natural juice or water.  2 tbs dried fruit.  12 to 15 grapes or  cherries. Milk and Yogurt  1 cup fat-free or 1% milk.  1 cup soy milk.  6 oz light yogurt with sugar-free sweetener.  6 oz low-fat soy yogurt.  6 oz plain yogurt. Vegetables  1 cup raw or  cup cooked is counted as 0 carbohydrates or a "free" food.  If you eat 3 or more servings at 1 meal, count them as 1 carbohydrate serving. Other Carbohydrates   oz chips or pretzels.   cup ice cream or frozen yogurt.   cup sherbet or sorbet.  2 inch square cake, no frosting.  1 tbs honey, sugar, jam, jelly, or syrup.  2 small cookies.  3 squares of graham crackers.  3 cups popcorn.  6 crackers.  1 cup broth-based soup.  Count 1 cup casserole or other mixed foods as 2 carbohydrate servings.  Foods with less than 20 calories in a serving may be counted as 0 carbohydrates or a "free" food. You may want to purchase a book or computer software that lists the carbohydrate gram counts of different foods. In addition, the nutrition facts panel on the labels of the foods you eat are a good source of this information. The label will tell you how big the serving size is and the total number of carbohydrate grams you will be eating per serving. Divide this number by 15 to obtain the number of carbohydrate servings in a portion. Remember, 1 carbohydrate serving equals 15 grams of carbohydrate. SERVING SIZES Measuring foods and serving sizes helps  you make sure you are getting the right amount of food. The list below tells how big or small some common serving sizes are.  1 oz.........4 stacked dice.  3 oz........Marland KitchenDeck of cards.  1 tsp.......Marland KitchenTip of little finger.  1 tbs......Marland KitchenMarland KitchenThumb.  2 tbs.......Marland KitchenGolf ball.   cup......Marland KitchenHalf of a fist.  1 cup.......Marland KitchenA fist. SAMPLE DIABETES MEAL PLAN Below is a sample meal plan that includes foods from the grain and starches, dairy, vegetable, fruit, and meat groups. A dietician can individualize a meal plan to fit your calorie needs and tell you  the number of servings needed from each food group. However, controlling the total amount of carbohydrates in your meal or snack is more important than making sure you include all of the food groups at every meal. You may interchange carbohydrate containing foods (dairy, starches, and fruits). The meal plan below is an example of a 2000 calorie diet using carbohydrate counting. This meal plan has 17 carbohydrate servings. Breakfast  1 cup oatmeal (2 carb servings).   cup light yogurt (1 carb serving).  1 cup blueberries (1 carb serving).   cup almonds. Snack  1 large apple (2 carb servings).  1 low-fat string cheese stick. Lunch  Chicken breast salad.  1 cup spinach.   cup chopped tomatoes.  2 oz chicken breast, sliced.  2 tbs low-fat [place] dressing.  12 whole-wheat crackers (2 carb servings).  12 to 15 grapes (1 carb serving).  1 cup low-fat milk (1 carb serving). Snack  1 cup carrots.   cup hummus (1 carb serving). Dinner  3 oz broiled salmon.  1 cup brown rice (3 carb servings). Snack  1  cups steamed broccoli (1 carb serving) drizzled with 1 tsp olive oil and lemon juice.  1 cup light pudding (2 carb servings). DIABETES MEAL PLANNING WORKSHEET Your dietician can use this worksheet to help you decide how many servings of foods and what types of foods are right for you.  BREAKFAST Food Group and Servings / Carb Servings Grain/Starches __________________________________ Dairy __________________________________________ Vegetable ______________________________________ Fruit ___________________________________________ Meat __________________________________________ Fat ____________________________________________ LUNCH Food Group and Servings / Carb Servings Grain/Starches ___________________________________ Dairy ___________________________________________ Fruit ____________________________________________ Meat  ___________________________________________ Fat _____________________________________________ Laural Golden Food Group and Servings / Carb Servings Grain/Starches ___________________________________ Dairy ___________________________________________ Fruit ____________________________________________ Meat ___________________________________________ Fat _____________________________________________ SNACKS Food Group and Servings / Carb Servings Grain/Starches ___________________________________ Dairy ___________________________________________ Vegetable _______________________________________ Fruit ____________________________________________ Meat ___________________________________________ Fat _____________________________________________ DAILY TOTALS Starches _________________________ Vegetable ________________________ Fruit ____________________________ Dairy ____________________________ Meat ____________________________ Fat ______________________________ Document Released: 08/17/2005 Document Revised: 02/12/2012 Document Reviewed: 06/28/2009 [employer] Patient Information 2013 [place].

## 2013-03-16 ENCOUNTER — Encounter: Payer: Self-pay | Admitting: Family Medicine

## 2013-03-16 NOTE — Assessment & Plan Note (Signed)
Encouraged daily probiotics and fiber supplements. Avoid spicy, fatty foods.

## 2013-03-16 NOTE — Assessment & Plan Note (Signed)
Tolerating Simvastatin. TGs up avoid simple carbs and saturated fats, trans fats, start a krill oil cap daily

## 2013-03-16 NOTE — Assessment & Plan Note (Signed)
TSH WNL

## 2013-03-16 NOTE — Assessment & Plan Note (Signed)
Well controlled, no change in meds 

## 2013-03-16 NOTE — Progress Notes (Signed)
Patient ID: Daniel Gates, male   DOB: 11/04/1946, 67 y.o.   MRN: [id number] ELIGH RYBACKI [id number] [birth year] 03/16/2013      Progress Note-Follow Up  Subjective  Chief Complaint  Chief Complaint  Patient presents with  . Hypertension    Pt here for 3 month follow up.  . Hyperlipidemia  . Hyperglycemia  . Restless Leg Syndrome    Pt reports slight improvement with requip.  Marland Kitchen elevated serum creatinine    HPI  Patient is a 67 year old male who is in today for followup generally he is doing well except for some abdominal discomfort and nausea. He has trouble with intermittent cramps in his abdomen and red spots at work last week or 2. Been bothering him more for 2 months. Has some intermittent vomiting. Bowels are moving most days. Bloody and no diarrhea. No fevers or chills. No vomiting. No chest pain or palpitations no change in diet and no shortness of breath, headaches or other complaints noted  Past Medical History  Diagnosis Date  . Glaucoma   . Cancer     colon ;diagnosed 2003  . Depression   . Hypertension   . Allergic rhinitis   . Hyperlipidemia   . Gout     Past Surgical History  Procedure Laterality Date  . Inguinal hernia repair  1997    right  . Colon surgery  2003    for colon cancer  . Colonoscopy      Family History  Problem Relation Age of Onset  . Colon cancer Neg Hx   . Stomach cancer Neg Hx   . Esophageal cancer Neg Hx   . Rectal cancer Neg Hx     History   Social History  . Marital Status: Married    Spouse Name: N/A    Number of Children: N/A  . Years of Education: N/A   Occupational History  . Not on file.   Social History Main Topics  . Smoking status: Never Smoker   . Smokeless tobacco: Never Used  . Alcohol Use: No  . Drug Use: No  . Sexually Active: Yes   Other Topics Concern  . Not on file   Social History Narrative  . No narrative on file    Current Outpatient Prescriptions on File Prior to Visit  Medication  Sig Dispense Refill  . aspirin 81 MG tablet Take 81 mg by mouth daily.        Marland Kitchen BENICAR 40 MG tablet TAKE ONE TABLET BY MOUTH ONE TIME DAILY  30 tablet  0  . COD LIVER OIL PO Take by mouth.        . dutasteride (AVODART) 0.5 MG capsule Take 1 capsule (0.5 mg total) by mouth daily.  30 capsule  6  . escitalopram (LEXAPRO) 10 MG tablet Take 1 tablet (10 mg total) by mouth daily.  30 tablet  4  . furosemide (LASIX) 40 MG tablet Take 1 tablet (40 mg total) by mouth 2 (two) times daily. Take 1 in the am and 1 in midafternoon for edema and BP  60 tablet  11  . Garlic Oil 3 MG CAPS Take by mouth.        Marland Kitchen HYDROcodone-acetaminophen (LORCET) 10-650 MG per tablet Take 1 tablet by mouth every 4-6 hours. No more than 4 tablets per day.  No early refills.  120 tablet  5  . indomethacin (INDOCIN SR) 75 MG CR capsule Take 1 capsule (75 mg total) by mouth 3 (  three) times daily as needed.  90 capsule  6  . latanoprost (XALATAN) 0.005 % ophthalmic solution 1 drop at bedtime. Use as directed       . Multiple Vitamins-Minerals (ONE-A-DAY MENS HEALTH FORMULA) TABS Take 1 tablet by mouth daily.      . promethazine (PHENERGAN) 25 MG tablet TAKE ONE (1) TABLET(S) EVERY FOUR (4) HOURS AS NEEDED FOR NAUSEA  60 tablet  0  . rOPINIRole (REQUIP) 0.5 MG tablet TAKE ONE TABLET BY MOUTH AT BEDTIME. RESTLESS LEG SYNDROME.  30 tablet  0  . simvastatin (ZOCOR) 20 MG tablet TAKE ONE (1) TABLET(S) AT BEDTIME  30 tablet  6  . Tamsulosin HCl (FLOMAX) 0.4 MG CAPS TAKE ONE CAPSULE BY MOUTH DAILY AFTER SUPPER  30 capsule  11   No current facility-administered medications on file prior to visit.    No Known Allergies  Review of Systems  Review of Systems  Constitutional: Negative for fever and malaise/fatigue.  HENT: Negative for congestion.   Eyes: Negative for pain and discharge.  Respiratory: Negative for shortness of breath.   Cardiovascular: Negative for chest pain, palpitations and leg swelling.  Gastrointestinal: Positive  for heartburn, nausea and abdominal pain. Negative for diarrhea, constipation, blood in stool and melena.  Genitourinary: Negative for dysuria.  Musculoskeletal: Negative for falls.  Skin: Negative for rash.  Neurological: Negative for loss of consciousness and headaches.  Endo/Heme/Allergies: Negative for polydipsia.  Psychiatric/Behavioral: Negative for depression and suicidal ideas. The patient is not nervous/anxious and does not have insomnia.     Objective  BP 122/86  Pulse 68  Temp(Src) 98.3 F (36.8 C) (Oral)  Resp 16  Wt 219 lb 1.3 oz (99.374 kg)  BMI 30.57 kg/m2  SpO2 97%  Physical Exam  Physical Exam  Constitutional: He is oriented to person, place, and time and well-developed, well-nourished, and in no distress. No distress.  HENT:  Head: Normocephalic and atraumatic.  Eyes: Conjunctivae are normal.  Neck: Neck supple. No thyromegaly present.  Cardiovascular: Normal rate, regular rhythm and normal heart sounds.   No murmur heard. Pulmonary/Chest: Effort normal and breath sounds normal. No respiratory distress.  Abdominal: He exhibits no distension and no mass. There is no tenderness.  Musculoskeletal: He exhibits no edema.  Neurological: He is alert and oriented to person, place, and time.  Skin: Skin is warm.  Psychiatric: Memory, affect and judgment normal.    Lab Results  Component Value Date   TSH 3.648 12/15/2011   Lab Results  Component Value Date   WBC 4.9 02/11/2013   HGB 14.3 02/11/2013   HCT 42.0 02/11/2013   MCV 75.5* 02/11/2013   PLT 178 02/11/2013   Lab Results  Component Value Date   CREATININE 1.39* 02/11/2013   BUN 12 02/11/2013   NA 138 02/11/2013   K 4.5 02/11/2013   CL 103 02/11/2013   CO2 26 02/11/2013   Lab Results  Component Value Date   ALT 15 02/11/2013   AST 18 02/11/2013   ALKPHOS 68 02/11/2013   BILITOT 0.3 02/11/2013   Lab Results  Component Value Date   CHOL 148 02/11/2013   Lab Results  Component Value Date   HDL 31*  02/11/2013   Lab Results  Component Value Date   LDLCALC 56 02/11/2013   Lab Results  Component Value Date   TRIG 303* 02/11/2013   Lab Results  Component Value Date   CHOLHDL 4.8 02/11/2013     Assessment & Plan  HYPERTENSION Well  controlled, no change in meds.  HYPOTHYROIDISM TSH WNL  HYPERLIPIDEMIA Tolerating Simvastatin. TGs up avoid simple carbs and saturated fats, trans fats, start a krill oil cap daily  COLON CANCER, HX OF Encouraged daily probiotics and fiber supplements. Avoid spicy, fatty foods.

## 2013-03-27 ENCOUNTER — Other Ambulatory Visit: Payer: Self-pay | Admitting: Internal Medicine

## 2013-04-15 ENCOUNTER — Telehealth: Payer: Self-pay | Admitting: Internal Medicine

## 2013-04-15 MED ORDER — ROPINIROLE HCL 0.5 MG PO TABS: ORAL_TABLET | ORAL | Status: DC

## 2013-04-15 MED ORDER — OLMESARTAN MEDOXOMIL 40 MG PO TABS: 40.0000 mg | ORAL_TABLET | Freq: Every day | ORAL | Status: DC

## 2013-04-15 NOTE — Telephone Encounter (Signed)
Refill- benicar 40mg  tablet. Take one tablet by mouth one time daily. Last fill 4.8.14  Refill- ropinirole hcl 0.5mg  tablet. Take one tablet by mouth at bedtime for restless leg syndrome. Qty 30 last fill 4.8.14

## 2013-05-09 ENCOUNTER — Telehealth: Payer: Self-pay

## 2013-05-09 DIAGNOSIS — E785 Hyperlipidemia, unspecified: Secondary | ICD-10-CM

## 2013-05-09 DIAGNOSIS — M545 Low back pain: Secondary | ICD-10-CM

## 2013-05-09 DIAGNOSIS — R739 Hyperglycemia, unspecified: Secondary | ICD-10-CM

## 2013-05-09 DIAGNOSIS — I10 Essential (primary) hypertension: Secondary | ICD-10-CM

## 2013-05-09 DIAGNOSIS — D649 Anemia, unspecified: Secondary | ICD-10-CM

## 2013-05-09 DIAGNOSIS — E039 Hypothyroidism, unspecified: Secondary | ICD-10-CM

## 2013-05-09 DIAGNOSIS — E559 Vitamin D deficiency, unspecified: Secondary | ICD-10-CM

## 2013-05-09 DIAGNOSIS — R109 Unspecified abdominal pain: Secondary | ICD-10-CM

## 2013-05-09 DIAGNOSIS — R972 Elevated prostate specific antigen [PSA]: Secondary | ICD-10-CM

## 2013-05-09 LAB — CBC
HCT: 40 % (ref 39.0–52.0)
MCV: 74.8 fL — ABNORMAL LOW (ref 78.0–100.0)
Platelets: 183 10*3/uL (ref 150–400)
RBC: 5.35 MIL/uL (ref 4.22–5.81)
RDW: 16.4 % — ABNORMAL HIGH (ref 11.5–15.5)
WBC: 5.9 10*3/uL (ref 4.0–10.5)

## 2013-05-09 LAB — RENAL FUNCTION PANEL
CO2: 26 mEq/L (ref 19–32)
Chloride: 102 mEq/L (ref 96–112)
Creat: 1.56 mg/dL — ABNORMAL HIGH (ref 0.50–1.35)
Phosphorus: 3 mg/dL (ref 2.3–4.6)
Potassium: 4.9 mEq/L (ref 3.5–5.3)
Sodium: 136 mEq/L (ref 135–145)

## 2013-05-09 LAB — HEPATIC FUNCTION PANEL
Alkaline Phosphatase: 72 U/L (ref 39–117)
Indirect Bilirubin: 0.3 mg/dL (ref 0.0–0.9)
Total Bilirubin: 0.4 mg/dL (ref 0.3–1.2)
Total Protein: 7.2 g/dL (ref 6.0–8.3)

## 2013-05-09 LAB — HEMOGLOBIN A1C: Mean Plasma Glucose: 131 mg/dL — ABNORMAL HIGH (ref <117)

## 2013-05-09 LAB — TSH: TSH: 2.789 u[IU]/mL (ref 0.350–4.500)

## 2013-05-09 LAB — LIPID PANEL
LDL Cholesterol: 71 mg/dL (ref 0–99)
VLDL: 51 mg/dL — ABNORMAL HIGH (ref 0–40)

## 2013-05-09 NOTE — Telephone Encounter (Signed)
Labs ordered.

## 2013-05-12 ENCOUNTER — Telehealth: Payer: Self-pay | Admitting: Internal Medicine

## 2013-05-12 MED ORDER — DUTASTERIDE 0.5 MG PO CAPS: 0.5000 mg | ORAL_CAPSULE | Freq: Every day | ORAL | Status: DC

## 2013-05-12 NOTE — Telephone Encounter (Signed)
Refill- avodart 0.5mg  capsules. Take one capsule by mouth one time daily. Qty 30 last fill 6.9.14

## 2013-05-13 ENCOUNTER — Ambulatory Visit (INDEPENDENT_AMBULATORY_CARE_PROVIDER_SITE_OTHER): Payer: Medicare Other | Admitting: Family Medicine

## 2013-05-13 ENCOUNTER — Encounter: Payer: Self-pay | Admitting: Family Medicine

## 2013-05-13 VITALS — BP 128/88 | HR 61 | Temp 98.5°F | Ht 72.0 in | Wt 217.0 lb

## 2013-05-13 DIAGNOSIS — I10 Essential (primary) hypertension: Secondary | ICD-10-CM

## 2013-05-13 DIAGNOSIS — R799 Abnormal finding of blood chemistry, unspecified: Secondary | ICD-10-CM

## 2013-05-13 DIAGNOSIS — R7989 Other specified abnormal findings of blood chemistry: Secondary | ICD-10-CM

## 2013-05-13 DIAGNOSIS — E785 Hyperlipidemia, unspecified: Secondary | ICD-10-CM

## 2013-05-13 DIAGNOSIS — R972 Elevated prostate specific antigen [PSA]: Secondary | ICD-10-CM

## 2013-05-13 DIAGNOSIS — K589 Irritable bowel syndrome without diarrhea: Secondary | ICD-10-CM

## 2013-05-13 DIAGNOSIS — E039 Hypothyroidism, unspecified: Secondary | ICD-10-CM

## 2013-05-13 MED ORDER — DUTASTERIDE 0.5 MG PO CAPS: 0.5000 mg | ORAL_CAPSULE | Freq: Every day | ORAL | Status: DC

## 2013-05-13 MED ORDER — SIMVASTATIN 40 MG PO TABS: 40.0000 mg | ORAL_TABLET | Freq: Every day | ORAL | Status: DC

## 2013-05-13 NOTE — Patient Instructions (Addendum)
Labs prior to next visit, lipid, renal, cbc, tsh, hepatic, hgba1c  Krill oil caps such as MegaRed daily CoQ10 enzyme  OTC take daily  Cholesterol Cholesterol is a white, waxy, fat-like protein needed by your body in small amounts. The liver makes all the cholesterol you need. It is carried from the liver by the blood through the blood vessels. Deposits (plaque) may build up on blood vessel walls. This makes the arteries narrower and stiffer. Plaque increases the risk for heart attack and stroke. You cannot feel your cholesterol level even if it is very high. The only way to know is by a blood test to check your lipid (fats) levels. Once you know your cholesterol levels, you should keep a record of the test results. Work with your caregiver to to keep your levels in the desired range. WHAT THE RESULTS MEAN:  Total cholesterol is a rough measure of all the cholesterol in your blood.  LDL is the so-called bad cholesterol. This is the type that deposits cholesterol in the walls of the arteries. You want this level to be low.  HDL is the good cholesterol because it cleans the arteries and carries the LDL away. You want this level to be high.  Triglycerides are fat that the body can either burn for energy or store. High levels are closely linked to heart disease. DESIRED LEVELS:  Total cholesterol below 200.  LDL below 100 for people at risk, below 70 for very high risk.  HDL above 50 is good, above 60 is best.  Triglycerides below 150. HOW TO LOWER YOUR CHOLESTEROL:  Diet.  Choose fish or white meat chicken and [place], roasted or baked. Limit fatty cuts of red meat, fried foods, and processed meats, such as sausage and lunch meat.  Eat lots of fresh fruits and vegetables. Choose whole grains, beans, pasta, potatoes and cereals.  Use only small amounts of olive, corn or canola oils. Avoid butter, mayonnaise, shortening or palm kernel oils. Avoid foods with trans-fats.  Use skim/nonfat  milk and low-fat/nonfat yogurt and cheeses. Avoid whole milk, cream, ice cream, egg yolks and cheeses. Healthy desserts include angel food cake, ginger snaps, animal crackers, hard candy, popsicles, and low-fat/nonfat frozen yogurt. Avoid pastries, cakes, pies and cookies.  Exercise.  A regular program helps decrease LDL and raises HDL.  Helps with weight control.  Do things that increase your activity level like gardening, walking, or taking the stairs.  Medication.  May be prescribed by your caregiver to help lowering cholesterol and the risk for heart disease.  You may need medicine even if your levels are normal if you have several risk factors. HOME CARE INSTRUCTIONS   Follow your diet and exercise programs as suggested by your caregiver.  Take medications as directed.  Have blood work done when your caregiver feels it is necessary. MAKE SURE YOU:   Understand these instructions.  Will watch your condition.  Will get help right away if you are not doing well or get worse. Document Released: 08/15/2001 Document Revised: 02/12/2012 Document Reviewed: 02/05/2008 [facility] Patient Information 2014 [place], [place].

## 2013-05-15 ENCOUNTER — Encounter: Payer: Self-pay | Admitting: Family Medicine

## 2013-05-15 NOTE — Assessment & Plan Note (Signed)
TSH well controlled, no changes

## 2013-05-15 NOTE — Assessment & Plan Note (Signed)
Good improvement on Fiber Choice and Digestive Advantage.

## 2013-05-15 NOTE — Assessment & Plan Note (Signed)
stable °

## 2013-05-15 NOTE — Assessment & Plan Note (Signed)
Well-controlled on current meds 

## 2013-05-15 NOTE — Progress Notes (Signed)
Patient ID: Daniel Gates, male   DOB: [birth year], 67 y.o.   MRN: [id number] Daniel Gates [id number] 1946-02-16 05/15/2013      Progress Note-Follow Up  Subjective  Chief Complaint  Chief Complaint  Patient presents with  . Follow-up    2 month    HPI  Patient is a 67 year old Caucasian male who is in today for followup. He is doing somewhat better. His bowels are better on combination of fiber choice and probiotics. No abdominal pain. No nausea vomiting, diarrhea or constipation. No recent illness, chest pain, palpitations, shortness of breath, GI or GU complaints. He is taking his medications as prescribed. No recent flare in gout  Past Medical History  Diagnosis Date  . Glaucoma   . Cancer     colon ;diagnosed 2003  . Depression   . Hypertension   . Allergic rhinitis   . Hyperlipidemia   . Gout     Past Surgical History  Procedure Laterality Date  . Inguinal hernia repair  1997    right  . Colon surgery  2003    for colon cancer  . Colonoscopy      Family History  Problem Relation Age of Onset  . Colon cancer Neg Hx   . Stomach cancer Neg Hx   . Esophageal cancer Neg Hx   . Rectal cancer Neg Hx     History   Social History  . Marital Status: Married    Spouse Name: N/A    Number of Children: N/A  . Years of Education: N/A   Occupational History  . Not on file.   Social History Main Topics  . Smoking status: Never Smoker   . Smokeless tobacco: Never Used  . Alcohol Use: No  . Drug Use: No  . Sexually Active: Yes   Other Topics Concern  . Not on file   Social History Narrative  . No narrative on file    Current Outpatient Prescriptions on File Prior to Visit  Medication Sig Dispense Refill  . aspirin 81 MG tablet Take 81 mg by mouth daily.        . COD LIVER OIL PO Take by mouth.        . dicyclomine (BENTYL) 20 MG tablet Take 1 tablet (20 mg total) by mouth every 6 (six) hours.  120 tablet  6  . escitalopram (LEXAPRO) 10 MG tablet Take  1 tablet (10 mg total) by mouth daily.  30 tablet  4  . furosemide (LASIX) 40 MG tablet Take 1 tablet (40 mg total) by mouth 2 (two) times daily. Take 1 in the am and 1 in midafternoon for edema and BP  60 tablet  11  . Garlic Oil 3 MG CAPS Take by mouth.        Marland Kitchen HYDROcodone-acetaminophen (LORCET) 10-650 MG per tablet Take 1 tablet by mouth every 4-6 hours. No more than 4 tablets per day.  No early refills.  120 tablet  5  . indomethacin (INDOCIN SR) 75 MG CR capsule Take 1 capsule (75 mg total) by mouth 3 (three) times daily as needed.  90 capsule  6  . latanoprost (XALATAN) 0.005 % ophthalmic solution 1 drop at bedtime. Use as directed       . Multiple Vitamins-Minerals (ONE-A-DAY MENS HEALTH FORMULA) TABS Take 1 tablet by mouth daily.      Marland Kitchen olmesartan (BENICAR) 40 MG tablet Take 1 tablet (40 mg total) by mouth daily.  30 tablet  2  .  promethazine (PHENERGAN) 25 MG tablet TAKE ONE (1) TABLET(S) EVERY FOUR (4) HOURS AS NEEDED FOR NAUSEA  60 tablet  0  . rOPINIRole (REQUIP) 0.5 MG tablet TAKE ONE TABLET BY MOUTH AT BEDTIME. RESTLESS LEG SYNDROME.  30 tablet  2  . Tamsulosin HCl (FLOMAX) 0.4 MG CAPS TAKE ONE CAPSULE BY MOUTH DAILY AFTER SUPPER  30 capsule  11   No current facility-administered medications on file prior to visit.    No Known Allergies  Review of Systems  Review of Systems  Constitutional: Negative for fever and malaise/fatigue.  HENT: Negative for congestion.   Eyes: Negative for discharge.  Respiratory: Negative for shortness of breath.   Cardiovascular: Negative for chest pain, palpitations and leg swelling.  Gastrointestinal: Negative for nausea, abdominal pain and diarrhea.  Genitourinary: Negative for dysuria.  Musculoskeletal: Negative for falls.  Skin: Negative for rash.  Neurological: Negative for loss of consciousness and headaches.  Endo/Heme/Allergies: Negative for polydipsia.  Psychiatric/Behavioral: Negative for depression and suicidal ideas. The patient  is not nervous/anxious and does not have insomnia.     Objective  BP 128/88  Pulse 61  Temp(Src) 98.5 F (36.9 C) (Oral)  Ht 6' (1.829 m)  Wt 217 lb (98.431 kg)  BMI 29.42 kg/m2  SpO2 97%  Physical Exam  Physical Exam  Constitutional: He is oriented to person, place, and time and well-developed, well-nourished, and in no distress. No distress.  HENT:  Head: Normocephalic and atraumatic.  Eyes: Conjunctivae are normal.  Neck: Neck supple. No thyromegaly present.  Cardiovascular: Normal rate, regular rhythm and normal heart sounds.   No murmur heard. Pulmonary/Chest: Effort normal and breath sounds normal. No respiratory distress.  Abdominal: He exhibits no distension and no mass. There is no tenderness.  Musculoskeletal: He exhibits no edema.  Neurological: He is alert and oriented to person, place, and time.  Skin: Skin is warm.  Psychiatric: Memory, affect and judgment normal.    Lab Results  Component Value Date   TSH 2.789 05/09/2013   Lab Results  Component Value Date   WBC 5.9 05/09/2013   HGB 13.7 05/09/2013   HCT 40.0 05/09/2013   MCV 74.8* 05/09/2013   PLT 183 05/09/2013   Lab Results  Component Value Date   CREATININE 1.56* 05/09/2013   BUN 16 05/09/2013   NA 136 05/09/2013   K 4.9 05/09/2013   CL 102 05/09/2013   CO2 26 05/09/2013   Lab Results  Component Value Date   ALT 12 05/09/2013   AST 16 05/09/2013   ALKPHOS 72 05/09/2013   BILITOT 0.4 05/09/2013   Lab Results  Component Value Date   CHOL 156 05/09/2013   Lab Results  Component Value Date   HDL 34* 05/09/2013   Lab Results  Component Value Date   LDLCALC 71 05/09/2013   Lab Results  Component Value Date   TRIG 256* 05/09/2013   Lab Results  Component Value Date   CHOLHDL 4.6 05/09/2013     Assessment & Plan  IRRITABLE BOWEL SYNDROME Good improvement on Fiber Choice and Digestive Advantage.   HYPOTHYROIDISM TSH well controlled, no changes  HYPERTENSION Well controlled on current  meds.  HYPERLIPIDEMIA Tolerating Simvastatin, avoid trans fats.  Elevated serum creatinine stable

## 2013-05-15 NOTE — Assessment & Plan Note (Signed)
Tolerating Simvastatin, avoid trans fats.  

## 2013-07-14 ENCOUNTER — Other Ambulatory Visit: Payer: Self-pay | Admitting: Family Medicine

## 2013-08-07 ENCOUNTER — Telehealth: Payer: Self-pay

## 2013-08-07 DIAGNOSIS — R739 Hyperglycemia, unspecified: Secondary | ICD-10-CM

## 2013-08-07 DIAGNOSIS — E039 Hypothyroidism, unspecified: Secondary | ICD-10-CM

## 2013-08-07 DIAGNOSIS — E785 Hyperlipidemia, unspecified: Secondary | ICD-10-CM

## 2013-08-07 DIAGNOSIS — R972 Elevated prostate specific antigen [PSA]: Secondary | ICD-10-CM

## 2013-08-07 DIAGNOSIS — I10 Essential (primary) hypertension: Secondary | ICD-10-CM

## 2013-08-07 DIAGNOSIS — E559 Vitamin D deficiency, unspecified: Secondary | ICD-10-CM

## 2013-08-07 DIAGNOSIS — D649 Anemia, unspecified: Secondary | ICD-10-CM

## 2013-08-07 NOTE — Telephone Encounter (Signed)
Lab order placed.

## 2013-08-08 LAB — HEPATIC FUNCTION PANEL
ALT: 16 U/L (ref 0–53)
AST: 18 U/L (ref 0–37)
Bilirubin, Direct: 0.1 mg/dL (ref 0.0–0.3)
Total Protein: 7.3 g/dL (ref 6.0–8.3)

## 2013-08-08 LAB — CBC
HCT: 42 % (ref 39.0–52.0)
MCH: 25.3 pg — ABNORMAL LOW (ref 26.0–34.0)
MCHC: 33.8 g/dL (ref 30.0–36.0)
MCV: 74.7 fL — ABNORMAL LOW (ref 78.0–100.0)
Platelets: 169 10*3/uL (ref 150–400)
RDW: 16.3 % — ABNORMAL HIGH (ref 11.5–15.5)
WBC: 4.6 10*3/uL (ref 4.0–10.5)

## 2013-08-08 LAB — HEMOGLOBIN A1C
Hgb A1c MFr Bld: 6.7 % — ABNORMAL HIGH (ref <5.7)
Mean Plasma Glucose: 146 mg/dL — ABNORMAL HIGH (ref <117)

## 2013-08-08 LAB — LIPID PANEL
Cholesterol: 160 mg/dL (ref 0–200)
HDL: 33 mg/dL — ABNORMAL LOW (ref >39)
Triglycerides: 480 mg/dL — ABNORMAL HIGH (ref <150)

## 2013-08-08 LAB — RENAL FUNCTION PANEL
Albumin: 3.8 g/dL (ref 3.5–5.2)
BUN: 15 mg/dL (ref 6–23)
CO2: 27 mEq/L (ref 19–32)
Chloride: 100 mEq/L (ref 96–112)
Creat: 1.27 mg/dL (ref 0.50–1.35)
Glucose, Bld: 106 mg/dL — ABNORMAL HIGH (ref 70–99)

## 2013-08-11 ENCOUNTER — Encounter: Payer: Self-pay | Admitting: Family Medicine

## 2013-08-11 ENCOUNTER — Telehealth: Payer: Self-pay | Admitting: Family Medicine

## 2013-08-11 ENCOUNTER — Ambulatory Visit (INDEPENDENT_AMBULATORY_CARE_PROVIDER_SITE_OTHER): Payer: Medicare Other | Admitting: Family Medicine

## 2013-08-11 VITALS — BP 110/82 | HR 67 | Temp 98.0°F | Ht 72.0 in | Wt 214.1 lb

## 2013-08-11 DIAGNOSIS — R7309 Other abnormal glucose: Secondary | ICD-10-CM

## 2013-08-11 DIAGNOSIS — I10 Essential (primary) hypertension: Secondary | ICD-10-CM

## 2013-08-11 DIAGNOSIS — D649 Anemia, unspecified: Secondary | ICD-10-CM

## 2013-08-11 DIAGNOSIS — E039 Hypothyroidism, unspecified: Secondary | ICD-10-CM

## 2013-08-11 DIAGNOSIS — R972 Elevated prostate specific antigen [PSA]: Secondary | ICD-10-CM

## 2013-08-11 DIAGNOSIS — R799 Abnormal finding of blood chemistry, unspecified: Secondary | ICD-10-CM

## 2013-08-11 DIAGNOSIS — R739 Hyperglycemia, unspecified: Secondary | ICD-10-CM

## 2013-08-11 DIAGNOSIS — E119 Type 2 diabetes mellitus without complications: Secondary | ICD-10-CM

## 2013-08-11 DIAGNOSIS — E785 Hyperlipidemia, unspecified: Secondary | ICD-10-CM

## 2013-08-11 DIAGNOSIS — R7989 Other specified abnormal findings of blood chemistry: Secondary | ICD-10-CM

## 2013-08-11 NOTE — Patient Instructions (Addendum)

## 2013-08-11 NOTE — Assessment & Plan Note (Signed)
Encouraged avoid simple carbs, eat small, frequent meals with lean proteins. Recheck in 3 months, if hgba1c goes much higher patient will benefit from medication

## 2013-08-11 NOTE — Assessment & Plan Note (Signed)
Asymptomatic but noted to be elevated in past, will check with next blood draw

## 2013-08-11 NOTE — Assessment & Plan Note (Signed)
Patient declined switch from Zocor to Lipitor despite higher numbers. Continue fish oil, increase exercise avoid trans fats and recheck in 3 months

## 2013-08-11 NOTE — Telephone Encounter (Signed)
Lab order     Psa, lipid ,renal, hepatic, tsh, cbc ,a1c

## 2013-08-11 NOTE — Assessment & Plan Note (Signed)
resolved 

## 2013-08-11 NOTE — Assessment & Plan Note (Signed)
Well controlled, no changes 

## 2013-08-11 NOTE — Assessment & Plan Note (Signed)
tsh wnl

## 2013-08-11 NOTE — Assessment & Plan Note (Signed)
acknowledges feeling weak and light headed after too much time in son recently. Encouraged increased hydration and report worsening symptom

## 2013-08-11 NOTE — Progress Notes (Signed)
Patient ID: Daniel Gates, male   DOB: [birth year], 67 y.o.   MRN: [id number] Daniel Gates [id number] 1946-07-17 08/11/2013      Progress Note-Follow Up  Subjective  Chief Complaint  Chief Complaint  Patient presents with  . Follow-up    3 month    HPI  Patient is a 67 year old African American male in today for followup. He reports doing relatively well recently. Does acknowledge a couple times when he has been out in the hot sun for an extended period of time he will feel lightheaded and presyncopal but has never actually passed out. Otherwise denies recent illness. No chest pain, palpitations, shortness of breath, GI or GU complaints noted today. Taking medications as prescribed  Past Medical History  Diagnosis Date  . Glaucoma   . Cancer     colon ;diagnosed 2003  . Depression   . Hypertension   . Allergic rhinitis   . Hyperlipidemia   . Gout   . Diabetes type 2, controlled 06/27/2012    Past Surgical History  Procedure Laterality Date  . Inguinal hernia repair  1997    right  . Colon surgery  2003    for colon cancer  . Colonoscopy      Family History  Problem Relation Age of Onset  . Colon cancer Neg Hx   . Stomach cancer Neg Hx   . Esophageal cancer Neg Hx   . Rectal cancer Neg Hx   . Diabetes Mother   . Heart disease Mother     MI  . Cancer Father     prostate    History   Social History  . Marital Status: Married    Spouse Name: N/A    Number of Children: N/A  . Years of Education: N/A   Occupational History  . Not on file.   Social History Main Topics  . Smoking status: Never Smoker   . Smokeless tobacco: Never Used  . Alcohol Use: No  . Drug Use: No  . Sexual Activity: Yes   Other Topics Concern  . Not on file   Social History Narrative  . No narrative on file    Current Outpatient Prescriptions on File Prior to Visit  Medication Sig Dispense Refill  . aspirin 81 MG tablet Take 81 mg by mouth daily.        . COD LIVER OIL PO  Take by mouth.        . dicyclomine (BENTYL) 20 MG tablet Take 1 tablet (20 mg total) by mouth every 6 (six) hours.  120 tablet  6  . dutasteride (AVODART) 0.5 MG capsule Take 1 capsule (0.5 mg total) by mouth daily.  30 capsule  6  . escitalopram (LEXAPRO) 10 MG tablet TAKE ONE TABLET BY MOUTH ONE TIME DAILY  30 tablet  0  . furosemide (LASIX) 40 MG tablet Take 1 tablet (40 mg total) by mouth 2 (two) times daily. Take 1 in the am and 1 in midafternoon for edema and BP  60 tablet  11  . Garlic Oil 3 MG CAPS Take by mouth.        Marland Kitchen HYDROcodone-acetaminophen (LORCET) 10-650 MG per tablet Take 1 tablet by mouth every 4-6 hours. No more than 4 tablets per day.  No early refills.  120 tablet  5  . indomethacin (INDOCIN SR) 75 MG CR capsule Take 1 capsule (75 mg total) by mouth 3 (three) times daily as needed.  90 capsule  6  .  latanoprost (XALATAN) 0.005 % ophthalmic solution 1 drop at bedtime. Use as directed       . Multiple Vitamins-Minerals (ONE-A-DAY MENS HEALTH FORMULA) TABS Take 1 tablet by mouth daily.      Marland Kitchen olmesartan (BENICAR) 40 MG tablet Take 1 tablet (40 mg total) by mouth daily.  30 tablet  2  . promethazine (PHENERGAN) 25 MG tablet TAKE ONE (1) TABLET(S) EVERY FOUR (4) HOURS AS NEEDED FOR NAUSEA  60 tablet  0  . rOPINIRole (REQUIP) 0.5 MG tablet TAKE ONE TABLET BY MOUTH AT BEDTIME. RESTLESS LEG SYNDROME.  30 tablet  2  . simvastatin (ZOCOR) 40 MG tablet Take 1 tablet (40 mg total) by mouth at bedtime.  30 tablet  5  . Tamsulosin HCl (FLOMAX) 0.4 MG CAPS TAKE ONE CAPSULE BY MOUTH DAILY AFTER SUPPER  30 capsule  11   No current facility-administered medications on file prior to visit.    No Known Allergies  Review of Systems  Review of Systems  Constitutional: Negative for fever and malaise/fatigue.  HENT: Negative for congestion.   Eyes: Negative for discharge.  Respiratory: Negative for shortness of breath.   Cardiovascular: Negative for chest pain, palpitations and leg  swelling.  Gastrointestinal: Negative for nausea, abdominal pain and diarrhea.  Genitourinary: Negative for dysuria.  Musculoskeletal: Negative for falls.  Skin: Negative for rash.  Neurological: Negative for loss of consciousness and headaches.  Endo/Heme/Allergies: Negative for polydipsia.  Psychiatric/Behavioral: Negative for depression and suicidal ideas. The patient is not nervous/anxious and does not have insomnia.     Objective  BP 110/82  Pulse 67  Temp(Src) 98 F (36.7 C) (Oral)  Ht 6' (1.829 m)  Wt 214 lb 1.3 oz (97.106 kg)  BMI 29.03 kg/m2  SpO2 93%  Physical Exam  Physical Exam  Constitutional: He is oriented to person, place, and time and well-developed, well-nourished, and in no distress. No distress.  HENT:  Head: Normocephalic and atraumatic.  Eyes: Conjunctivae are normal.  Neck: Neck supple. No thyromegaly present.  Cardiovascular: Normal rate, regular rhythm and normal heart sounds.   No murmur heard. Pulmonary/Chest: Effort normal and breath sounds normal. No respiratory distress.  Abdominal: He exhibits no distension and no mass. There is no tenderness.  Musculoskeletal: He exhibits no edema.  Neurological: He is alert and oriented to person, place, and time.  Skin: Skin is warm.  Psychiatric: Memory, affect and judgment normal.    Lab Results  Component Value Date   TSH 3.974 08/07/2013   Lab Results  Component Value Date   WBC 4.6 08/07/2013   HGB 14.2 08/07/2013   HCT 42.0 08/07/2013   MCV 74.7* 08/07/2013   PLT 169 08/07/2013   Lab Results  Component Value Date   CREATININE 1.27 08/07/2013   BUN 15 08/07/2013   NA 133* 08/07/2013   K 4.7 08/07/2013   CL 100 08/07/2013   CO2 27 08/07/2013   Lab Results  Component Value Date   ALT 16 08/07/2013   AST 18 08/07/2013   ALKPHOS 79 08/07/2013   BILITOT 0.3 08/07/2013   Lab Results  Component Value Date   CHOL 160 08/07/2013   Lab Results  Component Value Date   HDL 33* 08/07/2013   Lab Results  Component  Value Date   LDLCALC Comment:   Not calculated due to Triglyceride >400. Suggest ordering Direct LDL (Unit Code: 30865).   Total Cholesterol/HDL Ratio:CHD Risk  Coronary Heart Disease Risk Table                                        Men       Women          1/2 Average Risk              3.4        3.3              Average Risk              5.0        4.4           2X Average Risk              9.6        7.1           3X Average Risk             23.4       11.0 Use the calculated Patient Ratio above and the CHD Risk table  to determine the patient's CHD Risk. ATP III Classification (LDL):       < 100        mg/dL         Optimal      562 - 129     mg/dL         Near or Above Optimal      130 - 159     mg/dL         Borderline High      160 - 189     mg/dL         High       > 130        mg/dL         Very High   07/10/5783   Lab Results  Component Value Date   TRIG 480* 08/07/2013   Lab Results  Component Value Date   CHOLHDL 4.8 08/07/2013     Assessment & Plan  HYPERTENSION Well controlled, no changes  HYPOTHYROIDISM tsh wnl  PROSTATE SPECIFIC ANTIGEN, ELEVATED Asymptomatic but noted to be elevated in past, will check with next blood draw  HYPERLIPIDEMIA Patient declined switch from Zocor to Lipitor despite higher numbers. Continue fish oil, increase exercise avoid trans fats and recheck in 3 months  ANEMIA resolved  Diabetes type 2, controlled Encouraged avoid simple carbs, eat small, frequent meals with lean proteins. Recheck in 3 months, if hgba1c goes much higher patient will benefit from medication  Elevated serum creatinine acknowledges feeling weak and light headed after too much time in [place] recently. Encouraged increased hydration and report worsening symptom

## 2013-08-16 ENCOUNTER — Other Ambulatory Visit: Payer: Self-pay | Admitting: Family Medicine

## 2013-09-12 ENCOUNTER — Other Ambulatory Visit: Payer: Self-pay | Admitting: Family Medicine

## 2013-09-15 NOTE — Telephone Encounter (Signed)
Last Ov 09.08.14, f/u 3 Months; Refill sent per [facility] refill protocol/SLS

## 2013-10-13 ENCOUNTER — Other Ambulatory Visit: Payer: Self-pay | Admitting: Family Medicine

## 2013-10-29 LAB — CBC
Platelets: 189 10*3/uL (ref 150–400)
RBC: 5.77 MIL/uL (ref 4.22–5.81)
WBC: 6.1 10*3/uL (ref 4.0–10.5)

## 2013-10-29 LAB — RENAL FUNCTION PANEL
Albumin: 4.3 g/dL (ref 3.5–5.2)
CO2: 27 mEq/L (ref 19–32)
Chloride: 100 mEq/L (ref 96–112)
Creat: 1.37 mg/dL — ABNORMAL HIGH (ref 0.50–1.35)
Phosphorus: 3.4 mg/dL (ref 2.3–4.6)
Sodium: 135 mEq/L (ref 135–145)

## 2013-10-29 LAB — HEPATIC FUNCTION PANEL
Albumin: 4.3 g/dL (ref 3.5–5.2)
Total Protein: 7.7 g/dL (ref 6.0–8.3)

## 2013-10-29 LAB — LIPID PANEL: Triglycerides: 415 mg/dL — ABNORMAL HIGH (ref <150)

## 2013-10-29 LAB — HEMOGLOBIN A1C: Hgb A1c MFr Bld: 6.4 % — ABNORMAL HIGH (ref <5.7)

## 2013-11-07 ENCOUNTER — Telehealth: Payer: Self-pay | Admitting: Family Medicine

## 2013-11-07 ENCOUNTER — Ambulatory Visit (INDEPENDENT_AMBULATORY_CARE_PROVIDER_SITE_OTHER): Payer: Medicare Other | Admitting: Family Medicine

## 2013-11-07 ENCOUNTER — Encounter: Payer: Self-pay | Admitting: Family Medicine

## 2013-11-07 VITALS — BP 132/86 | HR 71 | Temp 97.9°F | Ht 72.0 in | Wt 217.0 lb

## 2013-11-07 DIAGNOSIS — E119 Type 2 diabetes mellitus without complications: Secondary | ICD-10-CM

## 2013-11-07 DIAGNOSIS — K589 Irritable bowel syndrome without diarrhea: Secondary | ICD-10-CM

## 2013-11-07 DIAGNOSIS — E785 Hyperlipidemia, unspecified: Secondary | ICD-10-CM

## 2013-11-07 DIAGNOSIS — E039 Hypothyroidism, unspecified: Secondary | ICD-10-CM

## 2013-11-07 DIAGNOSIS — R972 Elevated prostate specific antigen [PSA]: Secondary | ICD-10-CM

## 2013-11-07 DIAGNOSIS — M549 Dorsalgia, unspecified: Secondary | ICD-10-CM

## 2013-11-07 DIAGNOSIS — I10 Essential (primary) hypertension: Secondary | ICD-10-CM

## 2013-11-07 MED ORDER — HYDROCODONE-ACETAMINOPHEN 10-325 MG PO TABS: 1.0000 | ORAL_TABLET | Freq: Four times a day (QID) | ORAL | Status: DC | PRN

## 2013-11-07 MED ORDER — ATORVASTATIN CALCIUM 40 MG PO TABS: 40.0000 mg | ORAL_TABLET | Freq: Every day | ORAL | Status: DC

## 2013-11-07 NOTE — Patient Instructions (Signed)
Probiotic daily, generic is fine CoQ10 enzyme daily   Prostate-Specific Antigen The prostate-specific antigen (PSA) is a blood test. It is used to help detect early forms of prostate cancer. The test is usually used along with other tests. The test is also used to follow the course of those who already have prostate cancer or who have been treated for prostate cancer. Some factors interfere with the results of the PSA. The factors listed below will either increase or decrease the PSA levels. They are:  Prescriptions used for male baldness.  Some herbs.  Active prostate infection.  Prior instrumentation or urinary catheterization.  Ejaculation up to 2 days prior to testing.  A noncancerous enlargement of the prostate.  Inflammation of the prostate.  Active urinary tract infection. If your test results are elevated, your caregiver will discuss the results with you. Your caregiver will also let you know if more evaluation is needed. PREPARATION FOR TEST No preparation or fasting is necessary. NORMAL FINDINGS Less than 4 ng/mL or Less than 32mcg/L (SI units) Ranges for normal findings may vary among different laboratories and hospitals. You should always check with your caregiver after having lab work or other tests done to discuss the meaning of your test results and whether your values are considered within normal limits. MEANING OF TEST  A normal value means prostate cancer is less likely. The chance of having prostate cancer increases if the value is between 4 ng/mL and 10 ng/mL. However, further testing will be needed. Values above 10 ng/mL indicate that there is a much higher chance of having prostate cancer (if the above situations that raise PSA are not present). Your caregiver will go over your test results with you and discuss the importance of this test. If this value is elevated, your caregiver may recommend further testing or evaluation. OBTAINING THE TEST RESULTS It is your  responsibility to obtain your test results. Ask the lab or department performing the test when and how you will get your results. Document Released: 12/23/2004 Document Revised: 02/12/2012 Document Reviewed: 06/28/2007 [facility] Patient Information 2014 [place], [place].

## 2013-11-07 NOTE — Progress Notes (Signed)
Patient ID: Daniel Gates, male   DOB: 17-Apr-1946, 67 y.o.   MRN: [id number] TJAY VELAZQUEZ [id number] [birth year] 11/07/2013      Progress Note-Follow Up  Subjective  Chief Complaint  Chief Complaint  Patient presents with  . Follow-up    3 month    HPI  Patient is a 67 year old male in today for followup. He had a recent episode of gastroenteritis but his symptoms have largely resolved. He only has mild nausea. No vomiting or diarrhea at this time. No fevers or chills. Denies chest pain, palpitations, shortness of breath, GI or GU concerns otherwise. Taking medications as prescribed.  Past Medical History  Diagnosis Date  . Glaucoma   . Cancer     colon ;diagnosed 2003  . Depression   . Hypertension   . Allergic rhinitis   . Hyperlipidemia   . Gout   . Diabetes type 2, controlled 06/27/2012    Past Surgical History  Procedure Laterality Date  . Inguinal hernia repair  1997    right  . Colon surgery  2003    for colon cancer  . Colonoscopy      Family History  Problem Relation Age of Onset  . Colon cancer Neg Hx   . Stomach cancer Neg Hx   . Esophageal cancer Neg Hx   . Rectal cancer Neg Hx   . Diabetes Mother   . Heart disease Mother     MI  . Cancer Father     prostate    History   Social History  . Marital Status: Married    Spouse Name: N/A    Number of Children: N/A  . Years of Education: N/A   Occupational History  . Not on file.   Social History Main Topics  . Smoking status: Never Smoker   . Smokeless tobacco: Never Used  . Alcohol Use: No  . Drug Use: No  . Sexual Activity: Yes   Other Topics Concern  . Not on file   Social History Narrative  . No narrative on file    Current Outpatient Prescriptions on File Prior to Visit  Medication Sig Dispense Refill  . aspirin 81 MG tablet Take 81 mg by mouth daily.        Marland Kitchen BENICAR 40 MG tablet TAKE ONE TABLET BY MOUTH ONCE DAILY  30 tablet  0  . COD LIVER OIL PO Take by mouth.        .  dicyclomine (BENTYL) 20 MG tablet Take 1 tablet (20 mg total) by mouth every 6 (six) hours.  120 tablet  6  . dutasteride (AVODART) 0.5 MG capsule Take 1 capsule (0.5 mg total) by mouth daily.  30 capsule  6  . escitalopram (LEXAPRO) 10 MG tablet TAKE ONE TABLET BY MOUTH DAILY  30 tablet  0  . furosemide (LASIX) 40 MG tablet Take 1 tablet (40 mg total) by mouth 2 (two) times daily. Take 1 in the am and 1 in midafternoon for edema and BP  60 tablet  11  . Garlic Oil 3 MG CAPS Take by mouth.        . indomethacin (INDOCIN SR) 75 MG CR capsule Take 1 capsule (75 mg total) by mouth 3 (three) times daily as needed.  90 capsule  6  . latanoprost (XALATAN) 0.005 % ophthalmic solution 1 drop at bedtime. Use as directed       . Multiple Vitamins-Minerals (ONE-A-DAY MENS HEALTH FORMULA) TABS Take 1  tablet by mouth daily.      . promethazine (PHENERGAN) 25 MG tablet TAKE ONE (1) TABLET(S) EVERY FOUR (4) HOURS AS NEEDED FOR NAUSEA  60 tablet  0  . rOPINIRole (REQUIP) 0.5 MG tablet TAKE ONE TABLET BY MOUTH AT BEDTIME FOR RESTLESS LEG SYNDROME  30 tablet  0  . Tamsulosin HCl (FLOMAX) 0.4 MG CAPS TAKE ONE CAPSULE BY MOUTH DAILY AFTER SUPPER  30 capsule  11   No current facility-administered medications on file prior to visit.    No Known Allergies  Review of Systems  Review of Systems  Constitutional: Negative for fever and malaise/fatigue.  HENT: Negative for congestion.   Eyes: Negative for discharge.  Respiratory: Negative for shortness of breath.   Cardiovascular: Negative for chest pain, palpitations and leg swelling.  Gastrointestinal: Positive for nausea. Negative for abdominal pain and diarrhea.  Genitourinary: Negative for dysuria.  Musculoskeletal: Negative for falls.  Skin: Negative for rash.  Neurological: Negative for loss of consciousness and headaches.  Endo/Heme/Allergies: Negative for polydipsia.  Psychiatric/Behavioral: Negative for depression and suicidal ideas. The patient is not  nervous/anxious and does not have insomnia.     Objective  BP 132/86  Pulse 71  Temp(Src) 97.9 F (36.6 C) (Oral)  Ht 6' (1.829 m)  Wt 217 lb 0.6 oz (98.449 kg)  BMI 29.43 kg/m2  SpO2 96%  Physical Exam  Physical Exam  Constitutional: He is oriented to person, place, and time and well-developed, well-nourished, and in no distress. No distress.  HENT:  Head: Normocephalic and atraumatic.  Eyes: Conjunctivae are normal.  Neck: Neck supple. No thyromegaly present.  Cardiovascular: Normal rate, regular rhythm and normal heart sounds.   No murmur heard. Pulmonary/Chest: Effort normal and breath sounds normal. No respiratory distress.  Abdominal: He exhibits no distension and no mass. There is no tenderness.  Musculoskeletal: He exhibits no edema.  Neurological: He is alert and oriented to person, place, and time.  Skin: Skin is warm.  Psychiatric: Memory, affect and judgment normal.    Lab Results  Component Value Date   TSH 4.309 10/29/2013   Lab Results  Component Value Date   WBC 6.1 10/29/2013   HGB 14.8 10/29/2013   HCT 42.7 10/29/2013   MCV 74.0* 10/29/2013   PLT 189 10/29/2013   Lab Results  Component Value Date   CREATININE 1.37* 10/29/2013   BUN 13 10/29/2013   NA 135 10/29/2013   K 4.5 10/29/2013   CL 100 10/29/2013   CO2 27 10/29/2013   Lab Results  Component Value Date   ALT 13 10/29/2013   AST 17 10/29/2013   ALKPHOS 85 10/29/2013   BILITOT 0.3 10/29/2013   Lab Results  Component Value Date   CHOL 151 10/29/2013   Lab Results  Component Value Date   HDL 35* 10/29/2013   Lab Results  Component Value Date   LDLCALC Comment:   Not calculated due to Triglyceride >400. Suggest ordering Direct LDL (Unit Code: 64403).   Total Cholesterol/HDL Ratio:CHD Risk                        Coronary Heart Disease Risk Table                                        Men       Women  1/2 Average Risk              3.4        3.3              Average Risk               5.0        4.4           2X Average Risk              9.6        7.1           3X Average Risk             23.4       11.0 Use the calculated Patient Ratio above and the CHD Risk table  to determine the patient's CHD Risk. ATP III Classification (LDL):       < 100        mg/dL         Optimal      161 - 129     mg/dL         Near or Above Optimal      130 - 159     mg/dL         Borderline High      160 - 189     mg/dL         High       > 096        mg/dL         Very High   04/54/0981   Lab Results  Component Value Date   TRIG 415* 10/29/2013   Lab Results  Component Value Date   CHOLHDL 4.3 10/29/2013     Assessment & Plan  HYPERTENSION Well controlled, no changes  IRRITABLE BOWEL SYNDROME Had an episode of gastroenteritis but has resolved now, just some mild nausea. Encouraged probiotics daily  Diabetes type 2, controlled Well controlled to changes. Avoid simple carbs  Elevated PSA Referred to Urology for further consideration  HYPERLIPIDEMIA Encouraged to Atorvastatin 40 mg daily   Patient declines Pneumovax and Flu shot

## 2013-11-07 NOTE — Telephone Encounter (Signed)
LAB ORDER WEEK OF 02-10-2014 Next appt annual  labs prior lipid, renal, cbc, tsh, hgba1c, hepatic

## 2013-11-07 NOTE — Progress Notes (Signed)
Pre visit review using our clinic review tool, if applicable. No additional management support is needed unless otherwise documented below in the visit note. 

## 2013-11-08 NOTE — Telephone Encounter (Signed)
Lab order placed.

## 2013-11-09 NOTE — Assessment & Plan Note (Signed)
Well controlled to changes. Avoid simple carbs

## 2013-11-09 NOTE — Assessment & Plan Note (Signed)
Well controlled, no changes 

## 2013-11-09 NOTE — Assessment & Plan Note (Signed)
Had an episode of gastroenteritis but has resolved now, just some mild nausea. Encouraged probiotics daily

## 2013-11-09 NOTE — Assessment & Plan Note (Signed)
Encouraged to Atorvastatin 40 mg daily

## 2013-11-09 NOTE — Assessment & Plan Note (Signed)
Referred to Urology for further consideration

## 2013-11-10 ENCOUNTER — Ambulatory Visit: Payer: Medicare Other | Admitting: Family Medicine

## 2013-11-12 ENCOUNTER — Other Ambulatory Visit: Payer: Self-pay | Admitting: Family Medicine

## 2013-11-20 ENCOUNTER — Ambulatory Visit: Payer: Medicare Other | Admitting: Internal Medicine

## 2013-12-10 ENCOUNTER — Other Ambulatory Visit: Payer: Self-pay | Admitting: Family Medicine

## 2013-12-10 NOTE — Telephone Encounter (Signed)
Refill sent per [facility] refill protocol/SLS  

## 2014-01-06 ENCOUNTER — Other Ambulatory Visit: Payer: Self-pay | Admitting: Family Medicine

## 2014-01-14 ENCOUNTER — Other Ambulatory Visit: Payer: Self-pay | Admitting: Family Medicine

## 2014-02-04 LAB — LIPID PANEL
Cholesterol: 146 mg/dL (ref 0–200)
HDL: 40 mg/dL (ref >39)
LDL Cholesterol: 40 mg/dL (ref 0–99)
Total CHOL/HDL Ratio: 3.7 Ratio
Triglycerides: 330 mg/dL — ABNORMAL HIGH (ref <150)
VLDL: 66 mg/dL — ABNORMAL HIGH (ref 0–40)

## 2014-02-04 LAB — RENAL FUNCTION PANEL
Albumin: 4.2 g/dL (ref 3.5–5.2)
BUN: 19 mg/dL (ref 6–23)
CO2: 29 mEq/L (ref 19–32)
Calcium: 10 mg/dL (ref 8.4–10.5)
Chloride: 100 mEq/L (ref 96–112)
Creat: 1.33 mg/dL (ref 0.50–1.35)
Glucose, Bld: 116 mg/dL — ABNORMAL HIGH (ref 70–99)
Phosphorus: 3.4 mg/dL (ref 2.3–4.6)
Potassium: 4.5 mEq/L (ref 3.5–5.3)
Sodium: 137 mEq/L (ref 135–145)

## 2014-02-04 LAB — HEPATIC FUNCTION PANEL
ALT: 14 U/L (ref 0–53)
AST: 14 U/L (ref 0–37)
Albumin: 4.2 g/dL (ref 3.5–5.2)
Alkaline Phosphatase: 93 U/L (ref 39–117)
Bilirubin, Direct: 0.1 mg/dL (ref 0.0–0.3)
Indirect Bilirubin: 0.3 mg/dL (ref 0.2–1.2)
Total Bilirubin: 0.4 mg/dL (ref 0.2–1.2)
Total Protein: 7.9 g/dL (ref 6.0–8.3)

## 2014-02-04 LAB — CBC
HCT: 44 % (ref 39.0–52.0)
Hemoglobin: 14.9 g/dL (ref 13.0–17.0)
MCH: 25.6 pg — ABNORMAL LOW (ref 26.0–34.0)
MCHC: 33.9 g/dL (ref 30.0–36.0)
MCV: 75.5 fL — ABNORMAL LOW (ref 78.0–100.0)
Platelets: 170 10*3/uL (ref 150–400)
RBC: 5.83 MIL/uL — ABNORMAL HIGH (ref 4.22–5.81)
RDW: 17.2 % — ABNORMAL HIGH (ref 11.5–15.5)
WBC: 6.4 10*3/uL (ref 4.0–10.5)

## 2014-02-04 LAB — HEMOGLOBIN A1C
Hgb A1c MFr Bld: 6.7 % — ABNORMAL HIGH (ref <5.7)
Mean Plasma Glucose: 146 mg/dL — ABNORMAL HIGH (ref <117)

## 2014-02-04 LAB — TSH: TSH: 4.005 u[IU]/mL (ref 0.350–4.500)

## 2014-02-10 ENCOUNTER — Telehealth: Payer: Self-pay | Admitting: Family Medicine

## 2014-02-10 ENCOUNTER — Encounter: Payer: Self-pay | Admitting: Family Medicine

## 2014-02-10 ENCOUNTER — Ambulatory Visit (INDEPENDENT_AMBULATORY_CARE_PROVIDER_SITE_OTHER): Payer: Medicare Other | Admitting: Family Medicine

## 2014-02-10 VITALS — BP 138/90 | HR 71 | Temp 98.0°F | Ht 72.0 in | Wt 222.1 lb

## 2014-02-10 DIAGNOSIS — D649 Anemia, unspecified: Secondary | ICD-10-CM

## 2014-02-10 DIAGNOSIS — M549 Dorsalgia, unspecified: Secondary | ICD-10-CM

## 2014-02-10 DIAGNOSIS — I10 Essential (primary) hypertension: Secondary | ICD-10-CM

## 2014-02-10 DIAGNOSIS — E119 Type 2 diabetes mellitus without complications: Secondary | ICD-10-CM

## 2014-02-10 DIAGNOSIS — G2581 Restless legs syndrome: Secondary | ICD-10-CM

## 2014-02-10 DIAGNOSIS — Z0000 Encounter for general adult medical examination without abnormal findings: Secondary | ICD-10-CM

## 2014-02-10 DIAGNOSIS — R9431 Abnormal electrocardiogram [ECG] [EKG]: Secondary | ICD-10-CM

## 2014-02-10 DIAGNOSIS — E785 Hyperlipidemia, unspecified: Secondary | ICD-10-CM

## 2014-02-10 MED ORDER — HYDROCODONE-ACETAMINOPHEN 10-325 MG PO TABS: 1.0000 | ORAL_TABLET | Freq: Four times a day (QID) | ORAL | Status: DC | PRN

## 2014-02-10 MED ORDER — ROPINIROLE HCL 1 MG PO TABS
1.0000 mg | ORAL_TABLET | Freq: Three times a day (TID) | ORAL | Status: DC
Start: 2014-02-10 — End: 2014-02-10

## 2014-02-10 MED ORDER — METFORMIN HCL 500 MG PO TABS: 500.0000 mg | ORAL_TABLET | Freq: Two times a day (BID) | ORAL | Status: DC

## 2014-02-10 NOTE — Telephone Encounter (Signed)
Relevant patient education mailed to patient.  

## 2014-02-10 NOTE — Patient Instructions (Signed)

## 2014-02-10 NOTE — Progress Notes (Signed)
Pre visit review using our clinic review tool, if applicable. No additional management support is needed unless otherwise documented below in the visit note. 

## 2014-02-13 ENCOUNTER — Other Ambulatory Visit: Payer: Self-pay | Admitting: Family Medicine

## 2014-02-15 ENCOUNTER — Encounter: Payer: Self-pay | Admitting: Family Medicine

## 2014-02-15 DIAGNOSIS — Z0000 Encounter for general adult medical examination without abnormal findings: Secondary | ICD-10-CM

## 2014-02-15 HISTORY — DX: Encounter for general adult medical examination without abnormal findings: Z00.00

## 2014-02-15 NOTE — Assessment & Plan Note (Signed)
Patient denies any difficulties at home. No trouble with ADLs, depression or falls. No recent changes to vision or hearing. Is UTD with immunizations. Is UTD with screening. Discussed Advanced Directives, patient agrees to bring us copies of documents if can. Encouraged heart healthy diet, exercise as tolerated and adequate sleep 

## 2014-02-15 NOTE — Assessment & Plan Note (Signed)
hgba1c acceptable, minimize simple carbs. Increase exercise as tolerated. Continue current meds 

## 2014-02-15 NOTE — Assessment & Plan Note (Signed)
Tolerating statin, encouraged heart healthy diet, avoid trans fats, minimize simple carbs and saturated fats. Increase exercise as tolerated 

## 2014-02-15 NOTE — Progress Notes (Signed)
Patient ID: KEANEN DOHSE, male   DOB: Feb 20, 1946, 68 y.o.   MRN: [id number] LAREN WHALING [id number] 1946/09/21 02/15/2014      Progress Note-Follow Up  Subjective  Chief Complaint  Chief Complaint  Patient presents with  . Annual Exam    physical    HPI  Patient is a 68 year old male in today for routine medical care. Doing well. No recent new concerns. Had diabetic eye exam January 2015. Has a long history of distant right ear injury with decreased hearing as a result, no recent changes. Blood sugars have been well controlled, no polyuria or polydipsia. Denies CP/palp/SOB/HA/congestion/fevers/GI or GU c/o. Taking meds as prescribed  Past Medical History  Diagnosis Date  . Glaucoma   . Cancer     colon ;diagnosed 2003  . Depression   . Hypertension   . Allergic rhinitis   . Hyperlipidemia   . Gout   . Diabetes type 2, controlled 06/27/2012  . Medicare annual wellness visit, subsequent 02/15/2014    Past Surgical History  Procedure Laterality Date  . Inguinal hernia repair  1997    right  . Colon surgery  2003    for colon cancer  . Colonoscopy      Family History  Problem Relation Age of Onset  . Colon cancer Neg Hx   . Stomach cancer Neg Hx   . Esophageal cancer Neg Hx   . Rectal cancer Neg Hx   . Diabetes Mother   . Heart disease Mother     MI  . Cancer Father     prostate    History   Social History  . Marital Status: Married    Spouse Name: N/A    Number of Children: N/A  . Years of Education: N/A   Occupational History  . Not on file.   Social History Main Topics  . Smoking status: Never Smoker   . Smokeless tobacco: Never Used  . Alcohol Use: No  . Drug Use: No  . Sexual Activity: Yes   Other Topics Concern  . Not on file   Social History Narrative  . No narrative on file    Current Outpatient Prescriptions on File Prior to Visit  Medication Sig Dispense Refill  . aspirin 81 MG tablet Take 81 mg by mouth daily.        Marland Kitchen  atorvastatin (LIPITOR) 40 MG tablet Take 1 tablet (40 mg total) by mouth daily.  30 tablet  5  . COD LIVER OIL PO Take by mouth.        . dicyclomine (BENTYL) 20 MG tablet TAKE ONE TABLET BY MOUTH EVERY SIX HOURS  120 tablet  0  . dutasteride (AVODART) 0.5 MG capsule Take 1 capsule (0.5 mg total) by mouth daily.  30 capsule  6  . furosemide (LASIX) 40 MG tablet Take 1 tablet (40 mg total) by mouth 2 (two) times daily. Take 1 in the am and 1 in midafternoon for edema and BP  60 tablet  11  . Garlic Oil 3 MG CAPS Take by mouth.        . indomethacin (INDOCIN SR) 75 MG CR capsule Take 1 capsule (75 mg total) by mouth 3 (three) times daily as needed.  90 capsule  6  . latanoprost (XALATAN) 0.005 % ophthalmic solution 1 drop at bedtime. Use as directed       . Multiple Vitamins-Minerals (ONE-A-DAY MENS HEALTH FORMULA) TABS Take 1 tablet by mouth daily.      Marland Kitchen  promethazine (PHENERGAN) 25 MG tablet TAKE ONE (1) TABLET(S) EVERY FOUR (4) HOURS AS NEEDED FOR NAUSEA  60 tablet  0  . simvastatin (ZOCOR) 40 MG tablet TAKE 1 TABLET BY MOUTH EVERY NIGHT AT BEDTIME  30 tablet  0  . Tamsulosin HCl (FLOMAX) 0.4 MG CAPS TAKE ONE CAPSULE BY MOUTH DAILY AFTER SUPPER  30 capsule  11   No current facility-administered medications on file prior to visit.    No Known Allergies  Review of Systems  Review of Systems  Constitutional: Negative for fever, chills and malaise/fatigue.  HENT: Negative for congestion, hearing loss and nosebleeds.   Eyes: Negative for discharge.  Respiratory: Negative for cough, sputum production, shortness of breath and wheezing.   Cardiovascular: Negative for chest pain, palpitations and leg swelling.  Gastrointestinal: Negative for heartburn, nausea, vomiting, abdominal pain, diarrhea, constipation and blood in stool.  Genitourinary: Negative for dysuria, urgency, frequency and hematuria.  Musculoskeletal: Negative for back pain, falls and myalgias.  Skin: Negative for rash.   Neurological: Negative for dizziness, tremors, sensory change, focal weakness, loss of consciousness, weakness and headaches.  Endo/Heme/Allergies: Negative for polydipsia. Does not bruise/bleed easily.  Psychiatric/Behavioral: Negative for depression and suicidal ideas. The patient is not nervous/anxious and does not have insomnia.     Objective  BP 138/90  Pulse 71  Temp(Src) 98 F (36.7 C) (Oral)  Ht 6' (1.829 m)  Wt 222 lb 1.3 oz (100.735 kg)  BMI 30.11 kg/m2  SpO2 94%  Physical Exam  Physical Exam  Constitutional: He is oriented to person, place, and time and well-developed, well-nourished, and in no distress. No distress.  HENT:  Head: Normocephalic and atraumatic.  Eyes: Conjunctivae are normal.  Neck: Neck supple. No thyromegaly present.  Cardiovascular: Normal rate, regular rhythm and normal heart sounds.   No murmur heard. Pulmonary/Chest: Effort normal and breath sounds normal. No respiratory distress.  Abdominal: He exhibits no distension and no mass. There is no tenderness.  Musculoskeletal: He exhibits no edema.  Neurological: He is alert and oriented to person, place, and time.  Skin: Skin is warm.  Psychiatric: Memory, affect and judgment normal.    Lab Results  Component Value Date   TSH 4.005 02/03/2014   Lab Results  Component Value Date   WBC 6.4 02/03/2014   HGB 14.9 02/03/2014   HCT 44.0 02/03/2014   MCV 75.5* 02/03/2014   PLT 170 02/03/2014   Lab Results  Component Value Date   CREATININE 1.33 02/03/2014   BUN 19 02/03/2014   NA 137 02/03/2014   K 4.5 02/03/2014   CL 100 02/03/2014   CO2 29 02/03/2014   Lab Results  Component Value Date   ALT 14 02/03/2014   AST 14 02/03/2014   ALKPHOS 93 02/03/2014   BILITOT 0.4 02/03/2014   Lab Results  Component Value Date   CHOL 146 02/03/2014   Lab Results  Component Value Date   HDL 40 02/03/2014   Lab Results  Component Value Date   LDLCALC 40 02/03/2014   Lab Results  Component Value Date   TRIG 330* 02/03/2014    Lab Results  Component Value Date   CHOLHDL 3.7 02/03/2014     Assessment & Plan  HYPERTENSION Well controlled, no changes to meds. Encouraged heart healthy diet such as the DASH diet and exercise as tolerated.   HYPERLIPIDEMIA Tolerating statin, encouraged heart healthy diet, avoid trans fats, minimize simple carbs and saturated fats. Increase exercise as tolerated  Diabetes type 2,  controlled hgba1c acceptable, minimize simple carbs. Increase exercise as tolerated. Continue current meds  ANEMIA Resolved. No concerns.  Medicare annual wellness visit, subsequent Patient denies any difficulties at home. No trouble with ADLs, depression or falls. No recent changes to vision or hearing. Is UTD with immunizations. Is UTD with screening. Discussed Advanced Directives, patient agrees to bring [place] copies of documents if can. Encouraged heart healthy diet, exercise as tolerated and adequate sleep.

## 2014-02-15 NOTE — Assessment & Plan Note (Signed)
Resolved. No concerns.  

## 2014-02-15 NOTE — Assessment & Plan Note (Signed)
Well controlled, no changes to meds. Encouraged heart healthy diet such as the DASH diet and exercise as tolerated.  °

## 2014-02-20 ENCOUNTER — Ambulatory Visit (INDEPENDENT_AMBULATORY_CARE_PROVIDER_SITE_OTHER): Payer: Medicare Other | Admitting: *Deleted

## 2014-02-20 DIAGNOSIS — E119 Type 2 diabetes mellitus without complications: Secondary | ICD-10-CM

## 2014-02-20 LAB — GLUCOSE, POCT (MANUAL RESULT ENTRY): POC GLUCOSE: 123 mg/dL — AB (ref 70–99)

## 2014-02-20 NOTE — Progress Notes (Signed)
   Subjective:    Patient ID: Daniel Gates, male    DOB: [birth year], 68 y.o.   MRN: [id number]  HPI    Review of Systems     Objective:   Physical Exam        Assessment & Plan:  Patient presents for newly diagnosed with Diabetes training on information RE: disease, medication and step-by-step, one-on-one training on how to check blood sugars with monitor, lancets & strips. Patient then performed his own CBG check and did well/sls

## 2014-03-09 ENCOUNTER — Ambulatory Visit: Payer: Medicare Other | Admitting: Family Medicine

## 2014-03-09 DIAGNOSIS — Z0289 Encounter for other administrative examinations: Secondary | ICD-10-CM

## 2014-03-17 ENCOUNTER — Other Ambulatory Visit: Payer: Self-pay | Admitting: Family Medicine

## 2014-03-18 NOTE — Telephone Encounter (Signed)
Pt was due for 4 week f/u on 03/09/14 and he "no showed' this appt.  Please call pt to reschedule appt. With Dr Charlett Blake.

## 2014-03-19 NOTE — Telephone Encounter (Signed)
Informed patient of medication refill and he scheduled appointment for 04/20/14

## 2014-04-01 ENCOUNTER — Encounter: Payer: Self-pay | Admitting: *Deleted

## 2014-04-01 ENCOUNTER — Encounter: Payer: Self-pay | Admitting: Cardiology

## 2014-04-01 ENCOUNTER — Ambulatory Visit (INDEPENDENT_AMBULATORY_CARE_PROVIDER_SITE_OTHER): Payer: Medicare Other | Admitting: Cardiology

## 2014-04-01 VITALS — BP 142/90 | HR 78 | Ht 72.0 in | Wt 220.0 lb

## 2014-04-01 DIAGNOSIS — E785 Hyperlipidemia, unspecified: Secondary | ICD-10-CM

## 2014-04-01 DIAGNOSIS — I10 Essential (primary) hypertension: Secondary | ICD-10-CM

## 2014-04-01 DIAGNOSIS — R9431 Abnormal electrocardiogram [ECG] [EKG]: Secondary | ICD-10-CM

## 2014-04-01 DIAGNOSIS — R079 Chest pain, unspecified: Secondary | ICD-10-CM | POA: Insufficient documentation

## 2014-04-01 NOTE — Assessment & Plan Note (Signed)
Continue statin. 

## 2014-04-01 NOTE — Assessment & Plan Note (Signed)
Continue present blood pressure medications. 

## 2014-04-01 NOTE — Progress Notes (Signed)
HPI: 68 year old male for evaluation of abnormal electrocardiogram. No prior cardiac history. Patient has dyspnea with more extreme activities but not routine activities. No orthopnea, PND, pedal edema, palpitations, syncope or exertional chest pain. Approximately one week ago he did have pain in her left breast area that he attributed to gas. It was present for approximately 2 days. No associated symptoms. Resolves spontaneously. Recently noted to have an abnormal electrocardiogram and cardiology asked to evaluate.  Current Outpatient Prescriptions  Medication Sig Dispense Refill  . aspirin 81 MG tablet Take 81 mg by mouth daily.        Marland Kitchen atorvastatin (LIPITOR) 40 MG tablet TAKE 1 TABLET BY MOUTH DAILY  30 tablet  0  . BENICAR 40 MG tablet TAKE 1 TABLET BY MOUTH DAILY  30 tablet  0  . COD LIVER OIL PO Take by mouth.        . dicyclomine (BENTYL) 20 MG tablet TAKE ONE TABLET BY MOUTH EVERY SIX HOURS  120 tablet  0  . dutasteride (AVODART) 0.5 MG capsule Take 1 capsule (0.5 mg total) by mouth daily.  30 capsule  6  . escitalopram (LEXAPRO) 10 MG tablet TAKE 1 TABLET BY MOUTH DAILY  30 tablet  0  . furosemide (LASIX) 40 MG tablet Take 1 tablet (40 mg total) by mouth 2 (two) times daily. Take 1 in the am and 1 in midafternoon for edema and BP  60 tablet  11  . Garlic Oil 3 MG CAPS Take by mouth.        Marland Kitchen HYDROcodone-acetaminophen (NORCO) 10-325 MG per tablet Take 1 tablet by mouth every 6 (six) hours as needed for moderate pain or severe pain.  120 tablet  0  . indomethacin (INDOCIN SR) 75 MG CR capsule Take 1 capsule (75 mg total) by mouth 3 (three) times daily as needed.  90 capsule  6  . latanoprost (XALATAN) 0.005 % ophthalmic solution 1 drop at bedtime. Use as directed       . metFORMIN (GLUCOPHAGE) 500 MG tablet Take 1 tablet (500 mg total) by mouth 2 (two) times daily with a meal.  180 tablet  3  . Multiple Vitamins-Minerals (ONE-A-DAY MENS HEALTH FORMULA) TABS Take 1 tablet by mouth  daily.      . promethazine (PHENERGAN) 25 MG tablet TAKE ONE (1) TABLET(S) EVERY FOUR (4) HOURS AS NEEDED FOR NAUSEA  60 tablet  0  . rOPINIRole (REQUIP) 1 MG tablet Take 1 mg by mouth at bedtime.      . simvastatin (ZOCOR) 40 MG tablet TAKE 1 TABLET BY MOUTH EVERY NIGHT AT BEDTIME  30 tablet  0  . Tamsulosin HCl (FLOMAX) 0.4 MG CAPS TAKE ONE CAPSULE BY MOUTH DAILY AFTER SUPPER  30 capsule  11   No current facility-administered medications for this visit.    No Known Allergies  Past Medical History  Diagnosis Date  . Glaucoma   . Cancer     colon; diagnosed 2003  . Depression   . Hypertension   . Allergic rhinitis   . Hyperlipidemia   . Gout   . Diabetes type 2, controlled 06/27/2012  . Medicare annual wellness visit, subsequent 02/15/2014    Past Surgical History  Procedure Laterality Date  . Inguinal hernia repair  1997    right  . Colon surgery  2003    for colon cancer  . Colonoscopy      History   Social History  . Marital Status: Widowed  Spouse Name: N/A    Number of Children: 7  . Years of Education: N/A   Occupational History  .     Social History Main Topics  . Smoking status: Never Smoker   . Smokeless tobacco: Never Used  . Alcohol Use: No  . Drug Use: No  . Sexual Activity: Yes   Other Topics Concern  . Not on file   Social History Narrative  . No narrative on file    Family History  Problem Relation Age of Onset  . Colon cancer Neg Hx   . Stomach cancer Neg Hx   . Esophageal cancer Neg Hx   . Rectal cancer Neg Hx   . Diabetes Mother   . Heart disease Mother     MI  . Cancer Father     prostate    ROS: Occasional pain in feet but no fevers or chills, productive cough, hemoptysis, dysphasia, odynophagia, melena, hematochezia, dysuria, hematuria, rash, seizure activity, orthopnea, PND, pedal edema, claudication. Remaining systems are negative.  Physical Exam:   Blood pressure 142/90, pulse 78, height 6' (1.829 m), weight 220 lb  (99.791 kg).  General:  Well developed/well nourished in NAD Skin warm/dry Patient not depressed No peripheral clubbing Back-normal HEENT-normal/normal eyelids Neck supple/normal carotid upstroke bilaterally; no bruits; no JVD; no thyromegaly chest - CTA/ normal expansion CV - RRR/normal S1 and S2; no murmurs, rubs or gallops;  PMI nondisplaced Abdomen -NT/ND, no HSM, no mass, + bowel sounds, no bruit 2+ femoral pulses, no bruits Ext-no edema, chords, 2+ DP Neuro-grossly nonfocal  ECG 02/10/2014-sinus rhythm, left axis deviation, nonspecific ST changes.

## 2014-04-01 NOTE — Assessment & Plan Note (Signed)
Given abnormal electrocardiogram we'll plan stress echocardiogram to further evaluate. Chest pain atypical.

## 2014-04-01 NOTE — Assessment & Plan Note (Signed)
Plan stress echocardiogram for risk stratification.

## 2014-04-01 NOTE — Patient Instructions (Signed)
Your physician recommends that you schedule a follow-up appointment in: AS NEEDED PENDING TEST RESULTS  Your physician has requested that you have a stress echocardiogram. For further information please visit [email or web address]. Please follow instruction sheet as given.

## 2014-04-17 ENCOUNTER — Ambulatory Visit (HOSPITAL_COMMUNITY): Payer: Medicare Other | Attending: Cardiology

## 2014-04-17 DIAGNOSIS — R072 Precordial pain: Secondary | ICD-10-CM

## 2014-04-17 DIAGNOSIS — R9431 Abnormal electrocardiogram [ECG] [EKG]: Secondary | ICD-10-CM | POA: Insufficient documentation

## 2014-04-17 NOTE — Progress Notes (Signed)
Stress Echocardiogram performed.  

## 2014-04-20 ENCOUNTER — Encounter: Payer: Self-pay | Admitting: Family Medicine

## 2014-04-20 ENCOUNTER — Ambulatory Visit (INDEPENDENT_AMBULATORY_CARE_PROVIDER_SITE_OTHER): Payer: Medicare Other | Admitting: Family Medicine

## 2014-04-20 ENCOUNTER — Encounter: Payer: Self-pay | Admitting: Cardiology

## 2014-04-20 VITALS — BP 130/84 | HR 64 | Temp 98.2°F | Ht 72.0 in | Wt 218.1 lb

## 2014-04-20 DIAGNOSIS — R109 Unspecified abdominal pain: Secondary | ICD-10-CM

## 2014-04-20 DIAGNOSIS — E785 Hyperlipidemia, unspecified: Secondary | ICD-10-CM

## 2014-04-20 DIAGNOSIS — M549 Dorsalgia, unspecified: Secondary | ICD-10-CM

## 2014-04-20 DIAGNOSIS — E119 Type 2 diabetes mellitus without complications: Secondary | ICD-10-CM

## 2014-04-20 DIAGNOSIS — I10 Essential (primary) hypertension: Secondary | ICD-10-CM

## 2014-04-20 DIAGNOSIS — F3289 Other specified depressive episodes: Secondary | ICD-10-CM

## 2014-04-20 DIAGNOSIS — F329 Major depressive disorder, single episode, unspecified: Secondary | ICD-10-CM

## 2014-04-20 MED ORDER — DICYCLOMINE HCL 20 MG PO TABS: 20.0000 mg | ORAL_TABLET | Freq: Three times a day (TID) | ORAL | Status: DC | PRN

## 2014-04-20 MED ORDER — LOSARTAN POTASSIUM 100 MG PO TABS: 100.0000 mg | ORAL_TABLET | Freq: Every day | ORAL | Status: DC

## 2014-04-20 MED ORDER — HYDROCODONE-ACETAMINOPHEN 10-325 MG PO TABS: 1.0000 | ORAL_TABLET | Freq: Four times a day (QID) | ORAL | Status: DC | PRN

## 2014-04-20 NOTE — Patient Instructions (Signed)
Start a probiotic such as Dig[employer]ive Ad[employer]age or [employer]   Basic Carbohydrate Counting Basic carbohydrate counting is a way to plan meals. It is done by counting the amount of carbohydrate in foods. Foods that have carbohydrates are starches (grains, beans, starchy vegetables) and sweets. Eating carbohydrates increases blood glucose (sugar) levels. People with diabetes use carbohydrate counting to help keep their blood glucose at a normal level.  COUNTING CARBOHYDRATES IN FOODS The first step in counting carbohydrates is to learn how many carbohydrate servings you should have in every meal. A dietitian can plan this for you. After learning the amount of carbohydrates to include in your meal plan, you can start to choose the carbohydrate-containing foods you want to eat.  There are 2 ways to identify the amount of carbohydrates in the foods you eat.  Read the Nutrition Facts panel on food labels. You need 2 pieces of information from the Nutrition Facts panel to count carbohydrates this way:  Serving size.  Total carbohydrate (in grams). Decide how many servings you will be eating. If it is 1 serving, you will be eating the amount of carbohydrate listed on the panel. If you will be eating 2 servings, you will be eating double the amount of carbohydrate listed on the panel.   Learn serving sizes. A serving size of most carbohydrate-containing foods is about 15 grams (g). Listed below are single serving sizes of common carbohydrate-containing foods:  1 slice bread.   cup unsweetened, dry cereal.   cup hot cereal.   cup rice.   cup mashed potatoes.   cup pasta.  1 cup fresh fruit.   cup canned fruit.  1 cup milk (whole, 2%, or skim).   cup starchy vegetables (peas, corn, or potatoes). Counting carbohydrates this way is similar to looking on the Nutrition Facts panel. Decide how many servings you will eat first. Multiply the number of servings you eat by 15 g. For  example, if you have 2 cups of strawberries, you had 2 servings. That means you had 30 g of carbohydrate (2 servings x 15 g = 30 g). CALCULATING CARBOHYDRATES IN A MEAL Sample dinner  3 oz chicken breast.   cup brown rice.   cup corn.  1 cup fat-free milk.  1 cup strawberries with sugar-free whipped topping. Carbohydrate calculation First, identify the foods that contain carbohydrate:  Rice.  Corn.  Milk.  Strawberries. Calculate the number of servings eaten:  2 servings rice.  1 serving corn.  1 serving milk.  1 serving strawberries. Multiply the number of servings by 15 g:  2 servings rice x 15 g = 30 g.  1 serving corn x 15 g = 15 g.  1 serving milk x 15 g = 15 g.  1 serving strawberries x 15 g = 15 g. Add the amounts to find the total carbohydrates eaten: 30 g + 15 g + 15 g + 15 g = 75 g carbohydrate eaten at dinner. Document Released: 11/20/2005 Document Revised: 02/12/2012 Document Reviewed: 10/06/2011 [facility] Patient Information 2014 [place], [place].

## 2014-04-20 NOTE — Progress Notes (Signed)
Pre visit review using our clinic review tool, if applicable. No additional management support is needed unless otherwise documented below in the visit note. 

## 2014-04-22 ENCOUNTER — Encounter: Payer: Self-pay | Admitting: Family Medicine

## 2014-04-22 NOTE — Progress Notes (Signed)
Patient ID: Daniel Gates, male   DOB: 1946/06/04, 68 y.o.   MRN: [id number] ANASTASIO WOGAN [id number] 11-Apr-1946 04/22/2014      Progress Note-Follow Up  Subjective  Chief Complaint  Chief Complaint  Patient presents with  . Medication Refill    HPI  Patient is a 68 year old male in today for routine medical care. Reports blood Denies CP/palp/SOB/HA/congestion/fevers/GI or GU c/o. Taking meds as prescribed sugars have been well-controlled lately ranging from 80 to 1:15. No polyuria or polydipsia. Has been exercising regularly at the gym and walking. Declines Prevnar shot today.  Past Medical History  Diagnosis Date  . Glaucoma   . Cancer     colon; diagnosed 2003  . Depression   . Hypertension   . Allergic rhinitis   . Hyperlipidemia   . Gout   . Diabetes type 2, controlled 06/27/2012  . Medicare annual wellness visit, subsequent 02/15/2014    Past Surgical History  Procedure Laterality Date  . Inguinal hernia repair  1997    right  . Colon surgery  2003    for colon cancer  . Colonoscopy      Family History  Problem Relation Age of Onset  . Colon cancer Neg Hx   . Stomach cancer Neg Hx   . Esophageal cancer Neg Hx   . Rectal cancer Neg Hx   . Diabetes Mother   . Heart disease Mother     MI  . Cancer Father     prostate    History   Social History  . Marital Status: Widowed    Spouse Name: N/A    Number of Children: 7  . Years of Education: N/A   Occupational History  .     Social History Main Topics  . Smoking status: Never Smoker   . Smokeless tobacco: Never Used  . Alcohol Use: No  . Drug Use: No  . Sexual Activity: Yes   Other Topics Concern  . Not on file   Social History Narrative  . No narrative on file    Current Outpatient Prescriptions on File Prior to Visit  Medication Sig Dispense Refill  . aspirin 81 MG tablet Take 81 mg by mouth daily.        Marland Kitchen atorvastatin (LIPITOR) 40 MG tablet TAKE 1 TABLET BY MOUTH DAILY  30 tablet  0   . COD LIVER OIL PO Take by mouth.        . dutasteride (AVODART) 0.5 MG capsule Take 1 capsule (0.5 mg total) by mouth daily.  30 capsule  6  . escitalopram (LEXAPRO) 10 MG tablet TAKE 1 TABLET BY MOUTH DAILY  30 tablet  0  . furosemide (LASIX) 40 MG tablet Take 1 tablet (40 mg total) by mouth 2 (two) times daily. Take 1 in the am and 1 in midafternoon for edema and BP  60 tablet  11  . Garlic Oil 3 MG CAPS Take by mouth.        . indomethacin (INDOCIN SR) 75 MG CR capsule Take 1 capsule (75 mg total) by mouth 3 (three) times daily as needed.  90 capsule  6  . latanoprost (XALATAN) 0.005 % ophthalmic solution 1 drop at bedtime. Use as directed       . metFORMIN (GLUCOPHAGE) 500 MG tablet Take 1 tablet (500 mg total) by mouth 2 (two) times daily with a meal.  180 tablet  3  . Multiple Vitamins-Minerals (ONE-A-DAY MENS HEALTH FORMULA) TABS Take  1 tablet by mouth daily.      . promethazine (PHENERGAN) 25 MG tablet TAKE ONE (1) TABLET(S) EVERY FOUR (4) HOURS AS NEEDED FOR NAUSEA  60 tablet  0  . rOPINIRole (REQUIP) 1 MG tablet Take 1 mg by mouth at bedtime.      . simvastatin (ZOCOR) 40 MG tablet TAKE 1 TABLET BY MOUTH EVERY NIGHT AT BEDTIME  30 tablet  0  . Tamsulosin HCl (FLOMAX) 0.4 MG CAPS TAKE ONE CAPSULE BY MOUTH DAILY AFTER SUPPER  30 capsule  11   No current facility-administered medications on file prior to visit.    No Known Allergies  Review of Systems  Review of Systems  Constitutional: Negative for fever and malaise/fatigue.  HENT: Negative for congestion.   Eyes: Negative for discharge.  Respiratory: Negative for shortness of breath.   Cardiovascular: Negative for chest pain, palpitations and leg swelling.  Gastrointestinal: Negative for nausea, abdominal pain and diarrhea.  Genitourinary: Negative for dysuria.  Musculoskeletal: Negative for falls.  Skin: Negative for rash.  Neurological: Negative for loss of consciousness and headaches.  Endo/Heme/Allergies: Negative for  polydipsia.  Psychiatric/Behavioral: Negative for depression and suicidal ideas. The patient is not nervous/anxious and does not have insomnia.     Objective  BP 130/84  Pulse 64  Temp(Src) 98.2 F (36.8 C) (Oral)  Ht 6' (1.829 m)  Wt 218 lb 1.9 oz (98.939 kg)  BMI 29.58 kg/m2  SpO2 95%  Physical Exam  Physical Exam  Constitutional: He is oriented to person, place, and time and well-developed, well-nourished, and in no distress. No distress.  HENT:  Head: Normocephalic and atraumatic.  Eyes: Conjunctivae are normal.  Neck: Neck supple. No thyromegaly present.  Cardiovascular: Normal rate, regular rhythm and normal heart sounds.   No murmur heard. Pulmonary/Chest: Effort normal and breath sounds normal. No respiratory distress.  Abdominal: He exhibits no distension and no mass. There is no tenderness.  Musculoskeletal: He exhibits no edema.  Neurological: He is alert and oriented to person, place, and time.  Skin: Skin is warm.  Psychiatric: Memory, affect and judgment normal.    Lab Results  Component Value Date   TSH 4.005 02/03/2014   Lab Results  Component Value Date   WBC 6.4 02/03/2014   HGB 14.9 02/03/2014   HCT 44.0 02/03/2014   MCV 75.5* 02/03/2014   PLT 170 02/03/2014   Lab Results  Component Value Date   CREATININE 1.33 02/03/2014   BUN 19 02/03/2014   NA 137 02/03/2014   K 4.5 02/03/2014   CL 100 02/03/2014   CO2 29 02/03/2014   Lab Results  Component Value Date   ALT 14 02/03/2014   AST 14 02/03/2014   ALKPHOS 93 02/03/2014   BILITOT 0.4 02/03/2014   Lab Results  Component Value Date   CHOL 146 02/03/2014   Lab Results  Component Value Date   HDL 40 02/03/2014   Lab Results  Component Value Date   LDLCALC 40 02/03/2014   Lab Results  Component Value Date   TRIG 330* 02/03/2014   Lab Results  Component Value Date   CHOLHDL 3.7 02/03/2014     Assessment & Plan  HYPERTENSION Denies CP/palp/SOB/HA/congestion/fevers/GI or GU c/o. Taking meds as  prescribed  DEPRESSION Good response to Lexapro.. Continue the same  HYPERLIPIDEMIA Tolerating statin, encouraged heart healthy diet, avoid trans fats, minimize simple carbs and saturated fats. Increase exercise as tolerated  Diabetes type 2, controlled hgba1c acceptable, minimize simple carbs. Increase exercise  as tolerated. Continue current meds

## 2014-04-22 NOTE — Assessment & Plan Note (Signed)
Denies CP/palp/SOB/HA/congestion/fevers/GI or GU c/o. Taking meds as prescribed 

## 2014-04-22 NOTE — Assessment & Plan Note (Signed)
Tolerating statin, encouraged heart healthy diet, avoid trans fats, minimize simple carbs and saturated fats. Increase exercise as tolerated 

## 2014-04-22 NOTE — Assessment & Plan Note (Signed)
Good response to Lexapro.. Continue the same

## 2014-04-22 NOTE — Assessment & Plan Note (Signed)
hgba1c acceptable, minimize simple carbs. Increase exercise as tolerated. Continue current meds 

## 2014-05-15 ENCOUNTER — Other Ambulatory Visit: Payer: Self-pay | Admitting: Family Medicine

## 2014-05-25 ENCOUNTER — Other Ambulatory Visit: Payer: Self-pay | Admitting: Family Medicine

## 2014-06-14 ENCOUNTER — Other Ambulatory Visit: Payer: Self-pay | Admitting: Family Medicine

## 2014-07-15 ENCOUNTER — Telehealth: Payer: Self-pay | Admitting: *Deleted

## 2014-07-15 ENCOUNTER — Other Ambulatory Visit: Payer: Self-pay | Admitting: Family Medicine

## 2014-07-15 DIAGNOSIS — D649 Anemia, unspecified: Secondary | ICD-10-CM

## 2014-07-15 DIAGNOSIS — E039 Hypothyroidism, unspecified: Secondary | ICD-10-CM

## 2014-07-15 DIAGNOSIS — E785 Hyperlipidemia, unspecified: Secondary | ICD-10-CM

## 2014-07-15 DIAGNOSIS — I10 Essential (primary) hypertension: Secondary | ICD-10-CM

## 2014-07-15 LAB — RENAL FUNCTION PANEL
Albumin: 4.1 g/dL (ref 3.5–5.2)
BUN: 17 mg/dL (ref 6–23)
CALCIUM: 10 mg/dL (ref 8.4–10.5)
CHLORIDE: 102 meq/L (ref 96–112)
CO2: 28 mEq/L (ref 19–32)
Creat: 1.46 mg/dL — ABNORMAL HIGH (ref 0.50–1.35)
Glucose, Bld: 90 mg/dL (ref 70–99)
PHOSPHORUS: 2 mg/dL — AB (ref 2.3–4.6)
POTASSIUM: 4.8 meq/L (ref 3.5–5.3)
Sodium: 138 mEq/L (ref 135–145)

## 2014-07-15 LAB — HEPATIC FUNCTION PANEL
ALT: 13 U/L (ref 0–53)
AST: 17 U/L (ref 0–37)
Albumin: 4.1 g/dL (ref 3.5–5.2)
Alkaline Phosphatase: 79 U/L (ref 39–117)
BILIRUBIN DIRECT: 0.1 mg/dL (ref 0.0–0.3)
Indirect Bilirubin: 0.3 mg/dL (ref 0.2–1.2)
TOTAL PROTEIN: 7.9 g/dL (ref 6.0–8.3)
Total Bilirubin: 0.4 mg/dL (ref 0.2–1.2)

## 2014-07-15 LAB — CBC
HCT: 40.7 % (ref 39.0–52.0)
Hemoglobin: 14.2 g/dL (ref 13.0–17.0)
MCH: 26 pg (ref 26.0–34.0)
MCHC: 34.9 g/dL (ref 30.0–36.0)
MCV: 74.5 fL — ABNORMAL LOW (ref 78.0–100.0)
PLATELETS: 194 10*3/uL (ref 150–400)
RBC: 5.46 MIL/uL (ref 4.22–5.81)
RDW: 16 % — AB (ref 11.5–15.5)
WBC: 5.7 10*3/uL (ref 4.0–10.5)

## 2014-07-15 LAB — LIPID PANEL
CHOL/HDL RATIO: 3.2 ratio
Cholesterol: 125 mg/dL (ref 0–200)
HDL: 39 mg/dL — AB (ref >39)
LDL CALC: 42 mg/dL (ref 0–99)
TRIGLYCERIDES: 220 mg/dL — AB (ref <150)
VLDL: 44 mg/dL — AB (ref 0–40)

## 2014-07-15 LAB — HEMOGLOBIN A1C
Hgb A1c MFr Bld: 6.1 % — ABNORMAL HIGH (ref <5.7)
MEAN PLASMA GLUCOSE: 128 mg/dL — AB (ref <117)

## 2014-07-15 LAB — TSH: TSH: 4.293 u[IU]/mL (ref 0.350–4.500)

## 2014-07-15 NOTE — Telephone Encounter (Signed)
Pt presented to the lab, orders entered per 04/20/14 phone note.

## 2014-07-16 ENCOUNTER — Encounter: Payer: Self-pay | Admitting: Family

## 2014-07-17 ENCOUNTER — Other Ambulatory Visit: Payer: Self-pay | Admitting: Family Medicine

## 2014-07-20 ENCOUNTER — Ambulatory Visit (HOSPITAL_BASED_OUTPATIENT_CLINIC_OR_DEPARTMENT_OTHER)
Admission: RE | Admit: 2014-07-20 | Discharge: 2014-07-20 | Disposition: A | Discharge status: Home / Self Care | Payer: Medicare Other | Source: Ambulatory Visit | Attending: Family Medicine | Admitting: Family Medicine

## 2014-07-20 ENCOUNTER — Encounter: Payer: Self-pay | Admitting: Family Medicine

## 2014-07-20 ENCOUNTER — Other Ambulatory Visit: Payer: Self-pay | Admitting: Family Medicine

## 2014-07-20 ENCOUNTER — Ambulatory Visit (INDEPENDENT_AMBULATORY_CARE_PROVIDER_SITE_OTHER): Payer: Medicare Other | Admitting: Family Medicine

## 2014-07-20 VITALS — BP 130/88 | HR 68 | Temp 98.0°F | Ht 72.0 in | Wt 213.1 lb

## 2014-07-20 DIAGNOSIS — R609 Edema, unspecified: Secondary | ICD-10-CM

## 2014-07-20 DIAGNOSIS — M25562 Pain in left knee: Secondary | ICD-10-CM

## 2014-07-20 DIAGNOSIS — R937 Abnormal findings on diagnostic imaging of other parts of musculoskeletal system: Secondary | ICD-10-CM | POA: Insufficient documentation

## 2014-07-20 DIAGNOSIS — E119 Type 2 diabetes mellitus without complications: Secondary | ICD-10-CM

## 2014-07-20 DIAGNOSIS — E785 Hyperlipidemia, unspecified: Secondary | ICD-10-CM

## 2014-07-20 DIAGNOSIS — G8929 Other chronic pain: Secondary | ICD-10-CM | POA: Diagnosis not present

## 2014-07-20 DIAGNOSIS — M25569 Pain in unspecified knee: Secondary | ICD-10-CM | POA: Insufficient documentation

## 2014-07-20 DIAGNOSIS — I10 Essential (primary) hypertension: Secondary | ICD-10-CM

## 2014-07-20 DIAGNOSIS — R799 Abnormal finding of blood chemistry, unspecified: Secondary | ICD-10-CM

## 2014-07-20 DIAGNOSIS — R7989 Other specified abnormal findings of blood chemistry: Secondary | ICD-10-CM

## 2014-07-20 DIAGNOSIS — M549 Dorsalgia, unspecified: Secondary | ICD-10-CM

## 2014-07-20 MED ORDER — FUROSEMIDE 40 MG PO TABS: 40.0000 mg | ORAL_TABLET | Freq: Two times a day (BID) | ORAL | Status: DC | PRN

## 2014-07-20 MED ORDER — HYDROCODONE-ACETAMINOPHEN 10-325 MG PO TABS: 1.0000 | ORAL_TABLET | Freq: Four times a day (QID) | ORAL | Status: DC | PRN

## 2014-07-20 NOTE — Progress Notes (Signed)
Pre visit review using our clinic review tool, if applicable. No additional management support is needed unless otherwise documented below in the visit note. 

## 2014-07-20 NOTE — Patient Instructions (Addendum)
1 month renal for low phosphorus and renal insufficiency  Needs HGBA1C with annual labs after 10/29/2014   Ice and Aspercreme/Salon Pas gel to knee twice daily to knee  Osteoarthritis Osteoarthritis is a disease that causes soreness and inflammation of a joint. It occurs when the cartilage at the affected joint wears down. Cartilage acts as a cushion, covering the ends of bones where they meet to form a joint. Osteoarthritis is the most common form of arthritis. It often occurs in older people. The joints affected most often by this condition include those in the:  Ends of the fingers.  Thumbs.  Neck.  Lower back.  Knees.  Hips. CAUSES  Over time, the cartilage that covers the ends of bones begins to wear away. This causes bone to rub on bone, producing pain and stiffness in the affected joints.  RISK FACTORS Certain factors can increase your chances of having osteoarthritis, including:  Older age.  Excessive body weight.  Overuse of joints.  Previous joint injury. SIGNS AND SYMPTOMS   Pain, swelling, and stiffness in the joint.  Over time, the joint may lose its normal shape.  Small deposits of bone (osteophytes) may grow on the edges of the joint.  Bits of bone or cartilage can break off and float inside the joint space. This may cause more pain and damage. DIAGNOSIS  Your health care provider will do a physical exam and ask about your symptoms. Various tests may be ordered, such as:  X-rays of the affected joint.  An MRI scan.  Blood tests to rule out other types of arthritis.  Joint fluid tests. This involves using a needle to draw fluid from the joint and examining the fluid under a microscope. TREATMENT  Goals of treatment are to control pain and improve joint function. Treatment plans may include:  A prescribed exercise program that allows for rest and joint relief.  A weight control plan.  Pain relief techniques, such as:  Properly applied heat and  cold.  Electric pulses delivered to nerve endings under the skin (transcutaneous electrical nerve stimulation [TENS]).  Massage.  Certain nutritional supplements.  Medicines to control pain, such as:  Acetaminophen.  Nonsteroidal anti-inflammatory drugs (NSAIDs), such as naproxen.  Narcotic or central-acting agents, such as tramadol.  Corticosteroids. These can be given orally or as an injection.  Surgery to reposition the bones and relieve pain (osteotomy) or to remove loose pieces of bone and cartilage. Joint replacement may be needed in advanced states of osteoarthritis. HOME CARE INSTRUCTIONS   Take medicines only as directed by your health care provider.  Maintain a healthy weight. Follow your health care provider's instructions for weight control. This may include dietary instructions.  Exercise as directed. Your health care provider can recommend specific types of exercise. These may include:  Strengthening exercises. These are done to strengthen the muscles that support joints affected by arthritis. They can be performed with weights or with exercise bands to add resistance.  Aerobic activities. These are exercises, such as brisk walking or low-impact aerobics, that get your heart pumping.  Range-of-motion activities. These keep your joints limber.  Balance and agility exercises. These help you maintain daily living skills.  Rest your affected joints as directed by your health care provider.  Keep all follow-up visits as directed by your health care provider. SEEK MEDICAL CARE IF:   Your skin turns red.  You develop a rash in addition to your joint pain.  You have worsening joint pain.  You  have a fever along with joint or muscle aches. SEEK IMMEDIATE MEDICAL CARE IF:  You have a significant loss of weight or appetite.  You have night sweats. [place] of Arthritis and Musculoskeletal and Skin Diseases:  www.niams.SouthExposed.es  [employer] on Aging: http://kim-miller.com/  American College of Rheumatology: www.rheumatology.org Document Released: 11/20/2005 Document Revised: 04/06/2014 Document Reviewed: 07/28/2013 [facility] Patient Information 2015 [place], [place]. This information is not intended to replace advice given to you by your health care provider. Make sure you discuss any questions you have with your health care provider.

## 2014-07-25 ENCOUNTER — Encounter: Payer: Self-pay | Admitting: Family Medicine

## 2014-07-25 DIAGNOSIS — M25562 Pain in left knee: Secondary | ICD-10-CM | POA: Insufficient documentation

## 2014-07-25 DIAGNOSIS — R609 Edema, unspecified: Secondary | ICD-10-CM | POA: Insufficient documentation

## 2014-07-25 NOTE — Assessment & Plan Note (Signed)
Occuring for roughly 6 months. No injury, no swelling, redness or warmth, worse with twisting. Encouraged bracing, rest, ice, topical treataments, if no improvement may need referral for further management

## 2014-07-25 NOTE — Assessment & Plan Note (Signed)
Tolerating statin, encouraged heart healthy diet, avoid trans fats, minimize simple carbs and saturated fats. Increase exercise as tolerated 

## 2014-07-25 NOTE — Assessment & Plan Note (Signed)
Mild, stable, maintain current meds and adequate hydration

## 2014-07-25 NOTE — Assessment & Plan Note (Signed)
hgba1c acceptable, minimize simple carbs. Increase exercise as tolerated. Continue current meds 

## 2014-07-25 NOTE — Assessment & Plan Note (Signed)
Well controlled, no changes to meds. Encouraged heart healthy diet such as the DASH diet and exercise as tolerated.  °

## 2014-07-25 NOTE — Progress Notes (Signed)
Patient ID: Daniel Gates, male   DOB: 01-09-1946, 68 y.o.   MRN: [id number] Daniel Gates [id number] [birth year] 07/25/2014      Progress Note-Follow Up  Subjective  Chief Complaint  Chief Complaint  Patient presents with  . Follow-up    3 month    HPI  Patient is a 68 year old male in today for routine medical care. No recent illness. Is struggling with knee pain. Occuring for roughly 6 months. No injury, no swelling, redness or warmth, worse with twisting. Has increased exercise in the last several months as well. Is going to the gym nearly every day. Denies CP/palp/SOB/HA/congestion/fevers/GI or GU c/o. Taking meds as prescribed  Past Medical History  Diagnosis Date  . Glaucoma   . Cancer     colon; diagnosed 2003  . Depression   . Hypertension   . Allergic rhinitis   . Hyperlipidemia   . Gout   . Diabetes type 2, controlled 06/27/2012  . Medicare annual wellness visit, subsequent 02/15/2014    Past Surgical History  Procedure Laterality Date  . Inguinal hernia repair  1997    right  . Colon surgery  2003    for colon cancer  . Colonoscopy      Family History  Problem Relation Age of Onset  . Colon cancer Neg Hx   . Stomach cancer Neg Hx   . Esophageal cancer Neg Hx   . Rectal cancer Neg Hx   . Diabetes Mother   . Heart disease Mother     MI  . Cancer Father     prostate    History   Social History  . Marital Status: Widowed    Spouse Name: N/A    Number of Children: 7  . Years of Education: N/A   Occupational History  .     Social History Main Topics  . Smoking status: Never Smoker   . Smokeless tobacco: Never Used  . Alcohol Use: No  . Drug Use: No  . Sexual Activity: Yes   Other Topics Concern  . Not on file   Social History Narrative  . No narrative on file    Current Outpatient Prescriptions on File Prior to Visit  Medication Sig Dispense Refill  . aspirin 81 MG tablet Take 81 mg by mouth daily.        Marland Kitchen atorvastatin (LIPITOR)  40 MG tablet TAKE 1 TABLET BY MOUTH DAILY  30 tablet  3  . AVODART 0.5 MG capsule TAKE ONE CAPSULE BY MOUTH DAILY  30 capsule  3  . COD LIVER OIL PO Take by mouth.        . dicyclomine (BENTYL) 20 MG tablet TAKE ONE TABLET BY MOUTH EVERY SIX HOURS  120 tablet  1  . escitalopram (LEXAPRO) 10 MG tablet TAKE 1 TABLET BY MOUTH DAILY  30 tablet  3  . Garlic Oil 3 MG CAPS Take by mouth.        . indomethacin (INDOCIN SR) 75 MG CR capsule Take 1 capsule (75 mg total) by mouth 3 (three) times daily as needed.  90 capsule  6  . latanoprost (XALATAN) 0.005 % ophthalmic solution 1 drop at bedtime. Use as directed       . losartan (COZAAR) 100 MG tablet Take 1 tablet (100 mg total) by mouth daily.  90 tablet  1  . metFORMIN (GLUCOPHAGE) 500 MG tablet Take 1 tablet (500 mg total) by mouth 2 (two) times daily with a  meal.  180 tablet  3  . Multiple Vitamins-Minerals (ONE-A-DAY MENS HEALTH FORMULA) TABS Take 1 tablet by mouth daily.      . ONE TOUCH ULTRA TEST test strip TEST BLOOD SUGAR TWICE DAILY  100 each  1  . ONETOUCH DELICA LANCETS 45G MISC TEST BLOOD SUGAR TWICE DAILY  100 each  1  . promethazine (PHENERGAN) 25 MG tablet TAKE ONE (1) TABLET(S) EVERY FOUR (4) HOURS AS NEEDED FOR NAUSEA  60 tablet  0  . rOPINIRole (REQUIP) 1 MG tablet TAKE 1 TABLET BY MOUTH EVERY NIGHT AT BEDTIME  30 tablet  3  . Tamsulosin HCl (FLOMAX) 0.4 MG CAPS TAKE ONE CAPSULE BY MOUTH DAILY AFTER SUPPER  30 capsule  11   No current facility-administered medications on file prior to visit.    No Known Allergies  Review of Systems  Review of Systems  Constitutional: Negative for fever and malaise/fatigue.  HENT: Negative for congestion.   Eyes: Negative for discharge.  Respiratory: Negative for shortness of breath.   Cardiovascular: Negative for chest pain, palpitations and leg swelling.  Gastrointestinal: Negative for nausea, abdominal pain and diarrhea.  Genitourinary: Negative for dysuria.  Musculoskeletal: Negative for  falls.  Skin: Negative for rash.  Neurological: Negative for loss of consciousness and headaches.  Endo/Heme/Allergies: Negative for polydipsia.  Psychiatric/Behavioral: Negative for depression and suicidal ideas. The patient is not nervous/anxious and does not have insomnia.     Objective  BP 130/88  Pulse 68  Temp(Src) 98 F (36.7 C) (Oral)  Ht 6' (1.829 m)  Wt 213 lb 1.9 oz (96.671 kg)  BMI 28.90 kg/m2  SpO2 95%  Physical Exam  Physical Exam  Constitutional: He is oriented to person, place, and time and well-developed, well-nourished, and in no distress. No distress.  HENT:  Head: Normocephalic and atraumatic.  Eyes: Conjunctivae are normal.  Neck: Neck supple. No thyromegaly present.  Cardiovascular: Normal rate, regular rhythm and normal heart sounds.   No murmur heard. Pulmonary/Chest: Effort normal and breath sounds normal. No respiratory distress.  Abdominal: He exhibits no distension and no mass. There is no tenderness.  Musculoskeletal: He exhibits no edema.  Neurological: He is alert and oriented to person, place, and time.  Skin: Skin is warm.  Psychiatric: Memory, affect and judgment normal.    Lab Results  Component Value Date   TSH 4.293 07/15/2014   Lab Results  Component Value Date   WBC 5.7 07/15/2014   HGB 14.2 07/15/2014   HCT 40.7 07/15/2014   MCV 74.5* 07/15/2014   PLT 194 07/15/2014   Lab Results  Component Value Date   CREATININE 1.46* 07/15/2014   BUN 17 07/15/2014   NA 138 07/15/2014   K 4.8 07/15/2014   CL 102 07/15/2014   CO2 28 07/15/2014   Lab Results  Component Value Date   ALT 13 07/15/2014   AST 17 07/15/2014   ALKPHOS 79 07/15/2014   BILITOT 0.4 07/15/2014   Lab Results  Component Value Date   CHOL 125 07/15/2014   Lab Results  Component Value Date   HDL 39* 07/15/2014   Lab Results  Component Value Date   LDLCALC 42 07/15/2014   Lab Results  Component Value Date   TRIG 220* 07/15/2014   Lab Results  Component Value Date    CHOLHDL 3.2 07/15/2014     Assessment & Plan  HYPERTENSION Well controlled, no changes to meds. Encouraged heart healthy diet such as the DASH diet and exercise as  tolerated.   Diabetes type 2, controlled hgba1c acceptable, minimize simple carbs. Increase exercise as tolerated. Continue current meds  Elevated serum creatinine Mild, stable, maintain current meds and adequate hydration  HYPERLIPIDEMIA Tolerating statin, encouraged heart healthy diet, avoid trans fats, minimize simple carbs and saturated fats. Increase exercise as tolerated  Edema Minimize sodium elevate feet above heart bid for 15 minutes, consider compression hose. Consider Lasix prn  Left knee pain Occuring for roughly 6 months. No injury, no swelling, redness or warmth, worse with twisting. Encouraged bracing, rest, ice, topical treataments, if no improvement may need referral for further management

## 2014-07-25 NOTE — Assessment & Plan Note (Signed)
Minimize sodium elevate feet above heart bid for 15 minutes, consider compression hose. Consider Lasix prn

## 2014-09-02 ENCOUNTER — Other Ambulatory Visit: Payer: Self-pay | Admitting: Family Medicine

## 2014-09-17 ENCOUNTER — Other Ambulatory Visit: Payer: Self-pay | Admitting: Family Medicine

## 2014-10-12 ENCOUNTER — Other Ambulatory Visit: Payer: Self-pay | Admitting: Family Medicine

## 2014-10-14 ENCOUNTER — Other Ambulatory Visit: Payer: Self-pay | Admitting: Family Medicine

## 2014-10-28 ENCOUNTER — Other Ambulatory Visit: Payer: Self-pay | Admitting: Family Medicine

## 2014-11-05 ENCOUNTER — Ambulatory Visit: Payer: Medicare Other | Admitting: Family Medicine

## 2014-11-06 ENCOUNTER — Ambulatory Visit (INDEPENDENT_AMBULATORY_CARE_PROVIDER_SITE_OTHER): Payer: Medicare Other | Admitting: Physician Assistant

## 2014-11-06 ENCOUNTER — Encounter: Payer: Self-pay | Admitting: Physician Assistant

## 2014-11-06 VITALS — BP 168/102 | HR 73 | Temp 98.0°F | Ht 71.5 in | Wt 225.0 lb

## 2014-11-06 DIAGNOSIS — I10 Essential (primary) hypertension: Secondary | ICD-10-CM

## 2014-11-06 DIAGNOSIS — R972 Elevated prostate specific antigen [PSA]: Secondary | ICD-10-CM

## 2014-11-06 DIAGNOSIS — J00 Acute nasopharyngitis [common cold]: Secondary | ICD-10-CM

## 2014-11-06 DIAGNOSIS — Z0000 Encounter for general adult medical examination without abnormal findings: Secondary | ICD-10-CM

## 2014-11-06 DIAGNOSIS — M549 Dorsalgia, unspecified: Secondary | ICD-10-CM

## 2014-11-06 DIAGNOSIS — B9689 Other specified bacterial agents as the cause of diseases classified elsewhere: Secondary | ICD-10-CM

## 2014-11-06 DIAGNOSIS — J208 Acute bronchitis due to other specified organisms: Secondary | ICD-10-CM

## 2014-11-06 MED ORDER — HYDROCODONE-ACETAMINOPHEN 10-325 MG PO TABS: 1.0000 | ORAL_TABLET | Freq: Four times a day (QID) | ORAL | Status: DC | PRN

## 2014-11-06 MED ORDER — AMOXICILLIN 875 MG PO TABS: 875.0000 mg | ORAL_TABLET | Freq: Two times a day (BID) | ORAL | Status: DC

## 2014-11-06 NOTE — Progress Notes (Signed)
Pre visit review using our clinic review tool, if applicable. No additional management support is needed unless otherwise documented below in the visit note. 

## 2014-11-06 NOTE — Patient Instructions (Signed)
Please stop by the lab for blood work.  I will call you with your results. We will alter medication regimens based on your results. Please continue medications as directed.  For the bronchitis -- please take antibiotic as directed with food.  Increase fluid intake.  Use Coricidin HBP for symptomatic relief.  Follow-up if symptoms are not improving.  Preventive Care for Adults A healthy lifestyle and preventive care can promote health and wellness. Preventive health guidelines for men include the following key practices:  A routine yearly physical is a good way to check with your health care provider about your health and preventative screening. It is a chance to share any concerns and updates on your health and to receive a thorough exam.  Visit your dentist for a routine exam and preventative care every 6 months. Brush your teeth twice a day and floss once a day. Good oral hygiene prevents tooth decay and gum disease.  The frequency of eye exams is based on your age, health, family medical history, use of contact lenses, and other factors. Follow your health care provider's recommendations for frequency of eye exams.  Eat a healthy diet. Foods such as vegetables, fruits, whole grains, low-fat dairy products, and lean protein foods contain the nutrients you need without too many calories. Decrease your intake of foods high in solid fats, added sugars, and salt. Eat the right amount of calories for you.Get information about a proper diet from your health care provider, if necessary.  Regular physical exercise is one of the most important things you can do for your health. Most adults should get at least 150 minutes of moderate-intensity exercise (any activity that increases your heart rate and causes you to sweat) each week. In addition, most adults need muscle-strengthening exercises on 2 or more days a week.  Maintain a healthy weight. The body mass index (BMI) is a screening tool to identify  possible weight problems. It provides an estimate of body fat based on height and weight. Your health care provider can find your BMI and can help you achieve or maintain a healthy weight.For adults 20 years and older:  A BMI below 18.5 is considered underweight.  A BMI of 18.5 to 24.9 is normal.  A BMI of 25 to 29.9 is considered overweight.  A BMI of 30 and above is considered obese.  Maintain normal blood lipids and cholesterol levels by exercising and minimizing your intake of saturated fat. Eat a balanced diet with plenty of fruit and vegetables. Blood tests for lipids and cholesterol should begin at age 24 and be repeated every 5 years. If your lipid or cholesterol levels are high, you are over 50, or you are at high risk for heart disease, you may need your cholesterol levels checked more frequently.Ongoing high lipid and cholesterol levels should be treated with medicines if diet and exercise are not working.  If you smoke, find out from your health care provider how to quit. If you do not use tobacco, do not start.  Lung cancer screening is recommended for adults aged 55-80 years who are at high risk for developing lung cancer because of a history of smoking. A yearly low-dose CT scan of the lungs is recommended for people who have at least a 30-pack-year history of smoking and are a current smoker or have quit within the past 15 years. A pack year of smoking is smoking an average of 1 pack of cigarettes a day for 1 year (for example: 1 pack  a day for 30 years or 2 packs a day for 15 years). Yearly screening should continue until the smoker has stopped smoking for at least 15 years. Yearly screening should be stopped for people who develop a health problem that would prevent them from having lung cancer treatment.  If you choose to drink alcohol, do not have more than 2 drinks per day. One drink is considered to be 12 ounces (355 mL) of beer, 5 ounces (148 mL) of wine, or 1.5 ounces (44  mL) of liquor.  Avoid use of street drugs. Do not share needles with anyone. Ask for help if you need support or instructions about stopping the use of drugs.  High blood pressure causes heart disease and increases the risk of stroke. Your blood pressure should be checked at least every 1-2 years. Ongoing high blood pressure should be treated with medicines, if weight loss and exercise are not effective.  If you are 57-59 years old, ask your health care provider if you should take aspirin to prevent heart disease.  Diabetes screening involves taking a blood sample to check your fasting blood sugar level. This should be done once every 3 years, after age 7, if you are within normal weight and without risk factors for diabetes. Testing should be considered at a younger age or be carried out more frequently if you are overweight and have at least 1 risk factor for diabetes.  Colorectal cancer can be detected and often prevented. Most routine colorectal cancer screening begins at the age of 43 and continues through age 64. However, your health care provider may recommend screening at an earlier age if you have risk factors for colon cancer. On a yearly basis, your health care provider may provide home test kits to check for hidden blood in the stool. Use of a small camera at the end of a tube to directly examine the colon (sigmoidoscopy or colonoscopy) can detect the earliest forms of colorectal cancer. Talk to your health care provider about this at age 19, when routine screening begins. Direct exam of the colon should be repeated every 5-10 years through age 7, unless early forms of precancerous polyps or small growths are found.  People who are at an increased risk for hepatitis B should be screened for this virus. You are considered at high risk for hepatitis B if:  You were born in a country where hepatitis B occurs often. Talk with your health care provider about which countries are considered high  risk.  Your parents were born in a high-risk country and you have not received a shot to protect against hepatitis B (hepatitis B vaccine).  You have HIV or AIDS.  You use needles to inject street drugs.  You live with, or have sex with, someone who has hepatitis B.  You are a man who has sex with other men (MSM).  You get hemodialysis treatment.  You take certain medicines for conditions such as cancer, organ transplantation, and autoimmune conditions.  Hepatitis C blood testing is recommended for all people born from [birth year] through 1965 and any individual with known risks for hepatitis C.  Practice safe sex. Use condoms and avoid high-risk sexual practices to reduce the spread of sexually transmitted infections (STIs). STIs include gonorrhea, chlamydia, syphilis, trichomonas, herpes, HPV, and human immunodeficiency virus (HIV). Herpes, HIV, and HPV are viral illnesses that have no cure. They can result in disability, cancer, and death.  If you are at risk of being infected with  HIV, it is recommended that you take a prescription medicine daily to prevent HIV infection. This is called preexposure prophylaxis (PrEP). You are considered at risk if:  You are a man who has sex with other men (MSM) and have other risk factors.  You are a heterosexual man, are sexually active, and are at increased risk for HIV infection.  You take drugs by injection.  You are sexually active with a partner who has HIV.  Talk with your health care provider about whether you are at high risk of being infected with HIV. If you choose to begin PrEP, you should first be tested for HIV. You should then be tested every 3 months for as long as you are taking PrEP.  A one-time screening for abdominal aortic aneurysm (AAA) and surgical repair of large AAAs by ultrasound are recommended for men ages 71 to 30 years who are current or former smokers.  Healthy men should no longer receive prostate-specific antigen (PSA)  blood tests as part of routine cancer screening. Talk with your health care provider about prostate cancer screening.  Testicular cancer screening is not recommended for adult males who have no symptoms. Screening includes self-exam, a health care provider exam, and other screening tests. Consult with your health care provider about any symptoms you have or any concerns you have about testicular cancer.  Use sunscreen. Apply sunscreen liberally and repeatedly throughout the day. You should seek shade when your shadow is shorter than you. Protect yourself by wearing long sleeves, pants, a wide-brimmed hat, and sunglasses year round, whenever you are outdoors.  Once a month, do a whole-body skin exam, using a mirror to look at the skin on your back. Tell your health care provider about new moles, moles that have irregular borders, moles that are larger than a pencil eraser, or moles that have changed in shape or color.  Stay current with required vaccines (immunizations).  Influenza vaccine. All adults should be immunized every year.  Tetanus, diphtheria, and acellular pertussis (Td, Tdap) vaccine. An adult who has not previously received Tdap or who does not know his vaccine status should receive 1 dose of Tdap. This initial dose should be followed by tetanus and diphtheria toxoids (Td) booster doses every 10 years. Adults with an unknown or incomplete history of completing a 3-dose immunization series with Td-containing vaccines should begin or complete a primary immunization series including a Tdap dose. Adults should receive a Td booster every 10 years.  Varicella vaccine. An adult without evidence of immunity to varicella should receive 2 doses or a second dose if he has previously received 1 dose.  Human papillomavirus (HPV) vaccine. Males aged 48-21 years who have not received the vaccine previously should receive the 3-dose series. Males aged 22-26 years may be immunized. Immunization is  recommended through the age of 16 years for any male who has sex with males and did not get any or all doses earlier. Immunization is recommended for any person with an immunocompromised condition through the age of 31 years if he did not get any or all doses earlier. During the 3-dose series, the second dose should be obtained 4-8 weeks after the first dose. The third dose should be obtained 24 weeks after the first dose and 16 weeks after the second dose.  Zoster vaccine. One dose is recommended for adults aged 10 years or older unless certain conditions are present.  Measles, mumps, and rubella (MMR) vaccine. Adults born before [birth year] generally are considered immune  to measles and mumps. Adults born in [birth year] or later should have 1 or more doses of MMR vaccine unless there is a contraindication to the vaccine or there is laboratory evidence of immunity to each of the three diseases. A routine second dose of MMR vaccine should be obtained at least 28 days after the first dose for students attending postsecondary schools, health care workers, or international travelers. People who received inactivated measles vaccine or an unknown type of measles vaccine during 1963-1967 should receive 2 doses of MMR vaccine. People who received inactivated mumps vaccine or an unknown type of mumps vaccine before 1979 and are at high risk for mumps infection should consider immunization with 2 doses of MMR vaccine. Unvaccinated health care workers born before [birth year] who lack laboratory evidence of measles, mumps, or rubella immunity or laboratory confirmation of disease should consider measles and mumps immunization with 2 doses of MMR vaccine or rubella immunization with 1 dose of MMR vaccine.  Pneumococcal 13-valent conjugate (PCV13) vaccine. When indicated, a person who is uncertain of his immunization history and has no record of immunization should receive the PCV13 vaccine. An adult aged 22 years or older who has certain  medical conditions and has not been previously immunized should receive 1 dose of PCV13 vaccine. This PCV13 should be followed with a dose of pneumococcal polysaccharide (PPSV23) vaccine. The PPSV23 vaccine dose should be obtained at least 8 weeks after the dose of PCV13 vaccine. An adult aged 25 years or older who has certain medical conditions and previously received 1 or more doses of PPSV23 vaccine should receive 1 dose of PCV13. The PCV13 vaccine dose should be obtained 1 or more years after the last PPSV23 vaccine dose.  Pneumococcal polysaccharide (PPSV23) vaccine. When PCV13 is also indicated, PCV13 should be obtained first. All adults aged 61 years and older should be immunized. An adult younger than age 16 years who has certain medical conditions should be immunized. Any person who resides in a nursing home or long-term care facility should be immunized. An adult smoker should be immunized. People with an immunocompromised condition and certain other conditions should receive both PCV13 and PPSV23 vaccines. People with human immunodeficiency virus (HIV) infection should be immunized as soon as possible after diagnosis. Immunization during chemotherapy or radiation therapy should be avoided. Routine use of PPSV23 vaccine is not recommended for American Indians, [place] Natives, or people younger than 65 years unless there are medical conditions that require PPSV23 vaccine. When indicated, people who have unknown immunization and have no record of immunization should receive PPSV23 vaccine. One-time revaccination 5 years after the first dose of PPSV23 is recommended for people aged 19-64 years who have chronic kidney failure, nephrotic syndrome, asplenia, or immunocompromised conditions. People who received 1-2 doses of PPSV23 before age 29 years should receive another dose of PPSV23 vaccine at age 66 years or later if at least 5 years have passed since the previous dose. Doses of PPSV23 are not needed for  people immunized with PPSV23 at or after age 95 years.  Meningococcal vaccine. Adults with asplenia or persistent complement component deficiencies should receive 2 doses of quadrivalent meningococcal conjugate (MenACWY-D) vaccine. The doses should be obtained at least 2 months apart. Microbiologists working with certain meningococcal bacteria, [employer] recruits, people at risk during an outbreak, and people who travel to or live in countries with a high rate of meningitis should be immunized. A first-year college student up through age 97 years who is living in a  residence hall should receive a dose if he did not receive a dose on or after his 16th birthday. Adults who have certain high-risk conditions should receive one or more doses of vaccine.  Hepatitis A vaccine. Adults who wish to be protected from this disease, have certain high-risk conditions, work with hepatitis A-infected animals, work in hepatitis A research labs, or travel to or work in countries with a high rate of hepatitis A should be immunized. Adults who were previously unvaccinated and who anticipate close contact with an international adoptee during the first 60 days after arrival in the [place] from a country with a high rate of hepatitis A should be immunized.  Hepatitis B vaccine. Adults should be immunized if they wish to be protected from this disease, have certain high-risk conditions, may be exposed to blood or other infectious body fluids, are household contacts or sex partners of hepatitis B positive people, are clients or workers in certain care facilities, or travel to or work in countries with a high rate of hepatitis B.  Haemophilus influenzae type b (Hib) vaccine. A previously unvaccinated person with asplenia or sickle cell disease or having a scheduled splenectomy should receive 1 dose of Hib vaccine. Regardless of previous immunization, a recipient of a hematopoietic stem cell transplant should receive a 3-dose  series 6-12 months after his successful transplant. Hib vaccine is not recommended for adults with HIV infection. Preventive Service / Frequency Ages 2 to 15  Blood pressure check.** / Every 1 to 2 years.  Lipid and cholesterol check.** / Every 5 years beginning at age 68.  Hepatitis C blood test.** / For any individual with known risks for hepatitis C.  Skin self-exam. / Monthly.  Influenza vaccine. / Every year.  Tetanus, diphtheria, and acellular pertussis (Tdap, Td) vaccine.** / Consult your health care provider. 1 dose of Td every 10 years.  Varicella vaccine.** / Consult your health care provider.  HPV vaccine. / 3 doses over 6 months, if 30 or younger.  Measles, mumps, rubella (MMR) vaccine.** / You need at least 1 dose of MMR if you were born in 1958 or later. You may also need a second dose.  Pneumococcal 13-valent conjugate (PCV13) vaccine.** / Consult your health care provider.  Pneumococcal polysaccharide (PPSV23) vaccine.** / 1 to 2 doses if you smoke cigarettes or if you have certain conditions.  Meningococcal vaccine.** / 1 dose if you are age 77 to 10 years and a Market researcher living in a residence hall, or have one of several medical conditions. You may also need additional booster doses.  Hepatitis A vaccine.** / Consult your health care provider.  Hepatitis B vaccine.** / Consult your health care provider.  Haemophilus influenzae type b (Hib) vaccine.** / Consult your health care provider. Ages 22 to 31  Blood pressure check.** / Every 1 to 2 years.  Lipid and cholesterol check.** / Every 5 years beginning at age 67.  Lung cancer screening. / Every year if you are aged 51-80 years and have a 30-pack-year history of smoking and currently smoke or have quit within the past 15 years. Yearly screening is stopped once you have quit smoking for at least 15 years or develop a health problem that would prevent you from having lung cancer  treatment.  Fecal occult blood test (FOBT) of stool. / Every year beginning at age 70 and continuing until age 80. You may not have to do this test if you get a colonoscopy every 10 years.  Flexible sigmoidoscopy** or colonoscopy.** / Every 5 years for a flexible sigmoidoscopy or every 10 years for a colonoscopy beginning at age 34 and continuing until age 21.  Hepatitis C blood test.** / For all people born from [birth year] through 1965 and any individual with known risks for hepatitis C.  Skin self-exam. / Monthly.  Influenza vaccine. / Every year.  Tetanus, diphtheria, and acellular pertussis (Tdap/Td) vaccine.** / Consult your health care provider. 1 dose of Td every 10 years.  Varicella vaccine.** / Consult your health care provider.  Zoster vaccine.** / 1 dose for adults aged 78 years or older.  Measles, mumps, rubella (MMR) vaccine.** / You need at least 1 dose of MMR if you were born in 1958 or later. You may also need a second dose.  Pneumococcal 13-valent conjugate (PCV13) vaccine.** / Consult your health care provider.  Pneumococcal polysaccharide (PPSV23) vaccine.** / 1 to 2 doses if you smoke cigarettes or if you have certain conditions.  Meningococcal vaccine.** / Consult your health care provider.  Hepatitis A vaccine.** / Consult your health care provider.  Hepatitis B vaccine.** / Consult your health care provider.  Haemophilus influenzae type b (Hib) vaccine.** / Consult your health care provider. Ages 30 and over  Blood pressure check.** / Every 1 to 2 years.  Lipid and cholesterol check.**/ Every 5 years beginning at age 45.  Lung cancer screening. / Every year if you are aged 51-80 years and have a 30-pack-year history of smoking and currently smoke or have quit within the past 15 years. Yearly screening is stopped once you have quit smoking for at least 15 years or develop a health problem that would prevent you from having lung cancer treatment.  Fecal occult  blood test (FOBT) of stool. / Every year beginning at age 60 and continuing until age 28. You may not have to do this test if you get a colonoscopy every 10 years.  Flexible sigmoidoscopy** or colonoscopy.** / Every 5 years for a flexible sigmoidoscopy or every 10 years for a colonoscopy beginning at age 65 and continuing until age 15.  Hepatitis C blood test.** / For all people born from [birth year] through 1966 and any individual with known risks for hepatitis C.  Abdominal aortic aneurysm (AAA) screening.** / A one-time screening for ages 26 to 7 years who are current or former smokers.  Skin self-exam. / Monthly.  Influenza vaccine. / Every year.  Tetanus, diphtheria, and acellular pertussis (Tdap/Td) vaccine.** / 1 dose of Td every 10 years.  Varicella vaccine.** / Consult your health care provider.  Zoster vaccine.** / 1 dose for adults aged 106 years or older.  Pneumococcal 13-valent conjugate (PCV13) vaccine.** / Consult your health care provider.  Pneumococcal polysaccharide (PPSV23) vaccine.** / 1 dose for all adults aged 46 years and older.  Meningococcal vaccine.** / Consult your health care provider.  Hepatitis A vaccine.** / Consult your health care provider.  Hepatitis B vaccine.** / Consult your health care provider.  Haemophilus influenzae type b (Hib) vaccine.** / Consult your health care provider. **Family history and personal history of risk and conditions may change your health care provider's recommendations. Document Released: 01/16/2002 Document Revised: 11/25/2013 Document Reviewed: 04/17/2011 [facility] Patient Information 2015 [place], [place]. This information is not intended to replace advice given to you by your health care provider. Make sure you discuss any questions you have with your health care provider.

## 2014-11-07 LAB — CBC
HEMATOCRIT: 43.5 % (ref 39.0–52.0)
HEMOGLOBIN: 14 g/dL (ref 13.0–17.0)
MCHC: 32.2 g/dL (ref 30.0–36.0)
MCV: 78.6 fl (ref 78.0–100.0)
Platelets: 130 10*3/uL — ABNORMAL LOW (ref 150.0–400.0)
RBC: 5.54 Mil/uL (ref 4.22–5.81)
RDW: 16.4 % — AB (ref 11.5–15.5)
WBC: 7.4 10*3/uL (ref 4.0–10.5)

## 2014-11-07 LAB — PSA: PSA: 2.49 ng/mL (ref 0.10–4.00)

## 2014-11-07 LAB — TSH: TSH: 4.5 u[IU]/mL (ref 0.35–4.50)

## 2014-11-08 LAB — COMPREHENSIVE METABOLIC PANEL
ALK PHOS: 102 U/L (ref 39–117)
ALT: 18 U/L (ref 0–53)
AST: 19 U/L (ref 0–37)
Albumin: 3.7 g/dL (ref 3.5–5.2)
BILIRUBIN TOTAL: 0.3 mg/dL (ref 0.2–1.2)
BUN: 13 mg/dL (ref 6–23)
CO2: 22 mEq/L (ref 19–32)
CREATININE: 1.5 mg/dL (ref 0.4–1.5)
Calcium: 9.5 mg/dL (ref 8.4–10.5)
Chloride: 107 mEq/L (ref 96–112)
GFR: 60.94 mL/min (ref >60.00)
Glucose, Bld: 138 mg/dL — ABNORMAL HIGH (ref 70–99)
Potassium: 4.2 mEq/L (ref 3.5–5.1)
Sodium: 141 mEq/L (ref 135–145)
Total Protein: 7.2 g/dL (ref 6.0–8.3)

## 2014-11-08 LAB — LDL CHOLESTEROL, DIRECT: Direct LDL: 59.3 mg/dL

## 2014-11-08 LAB — LIPID PANEL
CHOL/HDL RATIO: 4
Cholesterol: 131 mg/dL (ref 0–200)
HDL: 35.3 mg/dL — ABNORMAL LOW (ref >39.00)
NonHDL: 95.7
TRIGLYCERIDES: 378 mg/dL — AB (ref 0.0–149.0)
VLDL: 75.6 mg/dL — AB (ref 0.0–40.0)

## 2014-11-09 ENCOUNTER — Telehealth: Payer: Self-pay | Admitting: Physician Assistant

## 2014-11-09 DIAGNOSIS — E785 Hyperlipidemia, unspecified: Secondary | ICD-10-CM

## 2014-11-09 LAB — URINALYSIS, ROUTINE W REFLEX MICROSCOPIC
Bilirubin Urine: NEGATIVE
HGB URINE DIPSTICK: NEGATIVE
Ketones, ur: NEGATIVE
LEUKOCYTES UA: NEGATIVE
NITRITE: NEGATIVE
PH: 6.5 (ref 5.0–8.0)
RBC / HPF: NONE SEEN (ref 0–?)
Specific Gravity, Urine: 1.015 (ref 1.000–1.030)
Total Protein, Urine: NEGATIVE
UROBILINOGEN UA: 0.2 (ref 0.0–1.0)
Urine Glucose: NEGATIVE
WBC, UA: NONE SEEN (ref 0–?)

## 2014-11-09 MED ORDER — ATORVASTATIN CALCIUM 40 MG PO TABS: 60.0000 mg | ORAL_TABLET | Freq: Every day | ORAL | Status: DC

## 2014-11-09 NOTE — Telephone Encounter (Signed)
Labs good overall.  PSA is improved and back in normal range.  His triglycerides are significantly elevated. Has he been taking Lipitor daily as directed?  If not, please restart medication as directed.  Ok to send in refills if needed.  If he has been taking, please let me know as we will need to start another medication or increase dose of Lipitor.

## 2014-11-09 NOTE — Telephone Encounter (Signed)
Pt states he has been taking his lipitor as directed.

## 2014-11-09 NOTE — Telephone Encounter (Signed)
Patient to take 60 mg (1.5 tablets daily).  New Rx has been sent to pharmacy.

## 2014-11-11 DIAGNOSIS — J208 Acute bronchitis due to other specified organisms: Secondary | ICD-10-CM

## 2014-11-11 DIAGNOSIS — B9689 Other specified bacterial agents as the cause of diseases classified elsewhere: Secondary | ICD-10-CM | POA: Insufficient documentation

## 2014-11-11 NOTE — Progress Notes (Signed)
Subjective:    Daniel Gates is a 68 y.o. male who presents for Medicare Annual/Subsequent preventive examination.   Preventive Screening-Counseling & Management  Tobacco History  Smoking status  . Never Smoker   Smokeless tobacco  . Never Used    Acute Problems: Patient complains of several weeks of productive cough, chest congestion and fatigue.  Denies chest pain, SOB or wheezing.  Denies fever, chills or malaise. Denies recent travel or sick contact.  Current Problems (verified) Patient Active Problem List   Diagnosis Date Noted  . Edema 07/25/2014  . Left knee pain 07/25/2014  . Nonspecific abnormal electrocardiogram (ECG) (EKG) 04/01/2014  . Chest pain 04/01/2014  . Medicare annual wellness visit, subsequent 02/15/2014  . Restless leg syndrome 11/21/2012  . Elevated serum creatinine 06/27/2012  . Diabetes type 2, controlled 06/27/2012  . ED (erectile dysfunction) 02/03/2012  . Elevated PSA 12/15/2011  . IRRITABLE BOWEL SYNDROME 12/29/2010  . BEN LOC HYPERPLASIA PROS W/O UR OBST & OTH LUTS 11/03/2009  . ACUTE GOUTY ARTHROPATHY 03/11/2009  . VITAMIN D DEFICIENCY 10/27/2008  . ANEMIA 10/27/2008  . ATOPIC RHINITIS 10/27/2008  . LOW BACK PAIN, CHRONIC 06/25/2008  . PROSTATE SPECIFIC ANTIGEN, ELEVATED 06/25/2008  . HYPOTHYROIDISM 10/17/2007  . Hyperlipidemia 10/17/2007  . DEPRESSION 07/19/2007  . HYPERTENSION 07/19/2007  . COLON CANCER, HX OF 07/19/2007    Medications Prior to Visit Current Outpatient Prescriptions on File Prior to Visit  Medication Sig Dispense Refill  . aspirin 81 MG tablet Take 81 mg by mouth daily.      . AVODART 0.5 MG capsule TAKE ONE CAPSULE BY MOUTH DAILY 30 capsule 3  . COD LIVER OIL PO Take by mouth.      . dicyclomine (BENTYL) 20 MG tablet TAKE 1 TABLET BY MOUTH EVERY 6 HOURS 120 tablet 0  . escitalopram (LEXAPRO) 10 MG tablet TAKE 1 TABLET BY MOUTH DAILY 30 tablet 3  . furosemide (LASIX) 40 MG tablet Take 1 tablet (40 mg total) by  mouth 2 (two) times daily as needed. Take 1 in the am and 1 in midafternoon for edema and BP as needed 60 tablet 11  . Garlic Oil 3 MG CAPS Take by mouth.      . indomethacin (INDOCIN SR) 75 MG CR capsule Take 1 capsule (75 mg total) by mouth 3 (three) times daily as needed. 90 capsule 6  . latanoprost (XALATAN) 0.005 % ophthalmic solution 1 drop at bedtime. Use as directed     . losartan (COZAAR) 100 MG tablet TAKE 1 TABLET BY MOUTH DAILY 90 tablet 0  . metFORMIN (GLUCOPHAGE) 500 MG tablet Take 1 tablet (500 mg total) by mouth 2 (two) times daily with a meal. 180 tablet 3  . Multiple Vitamins-Minerals (ONE-A-DAY MENS HEALTH FORMULA) TABS Take 1 tablet by mouth daily.    . ONE TOUCH ULTRA TEST test strip USE AS DIRECTED TO TEST BLOOD SUGAR TWICE DAILY 100 each 2  . ONETOUCH DELICA LANCETS 60F MISC TEST BLOOD SUGAR TWICE DAILY 100 each 2  . promethazine (PHENERGAN) 25 MG tablet TAKE ONE (1) TABLET(S) EVERY FOUR (4) HOURS AS NEEDED FOR NAUSEA 60 tablet 0  . rOPINIRole (REQUIP) 1 MG tablet TAKE 1 TABLET BY MOUTH EVERY NIGHT AT BEDTIME 30 tablet 3  . Tamsulosin HCl (FLOMAX) 0.4 MG CAPS TAKE ONE CAPSULE BY MOUTH DAILY AFTER SUPPER 30 capsule 11   No current facility-administered medications on file prior to visit.    Current Medications (verified) Current Outpatient Prescriptions  Medication Sig Dispense Refill  . aspirin 81 MG tablet Take 81 mg by mouth daily.      . AVODART 0.5 MG capsule TAKE ONE CAPSULE BY MOUTH DAILY 30 capsule 3  . COD LIVER OIL PO Take by mouth.      . dicyclomine (BENTYL) 20 MG tablet TAKE 1 TABLET BY MOUTH EVERY 6 HOURS 120 tablet 0  . escitalopram (LEXAPRO) 10 MG tablet TAKE 1 TABLET BY MOUTH DAILY 30 tablet 3  . furosemide (LASIX) 40 MG tablet Take 1 tablet (40 mg total) by mouth 2 (two) times daily as needed. Take 1 in the am and 1 in midafternoon for edema and BP as needed 60 tablet 11  . Garlic Oil 3 MG CAPS Take by mouth.      Marland Kitchen HYDROcodone-acetaminophen (NORCO)  10-325 MG per tablet Take 1 tablet by mouth every 6 (six) hours as needed for moderate pain or severe pain. 120 tablet 0  . indomethacin (INDOCIN SR) 75 MG CR capsule Take 1 capsule (75 mg total) by mouth 3 (three) times daily as needed. 90 capsule 6  . latanoprost (XALATAN) 0.005 % ophthalmic solution 1 drop at bedtime. Use as directed     . losartan (COZAAR) 100 MG tablet TAKE 1 TABLET BY MOUTH DAILY 90 tablet 0  . metFORMIN (GLUCOPHAGE) 500 MG tablet Take 1 tablet (500 mg total) by mouth 2 (two) times daily with a meal. 180 tablet 3  . Multiple Vitamins-Minerals (ONE-A-DAY MENS HEALTH FORMULA) TABS Take 1 tablet by mouth daily.    . ONE TOUCH ULTRA TEST test strip USE AS DIRECTED TO TEST BLOOD SUGAR TWICE DAILY 100 each 2  . ONETOUCH DELICA LANCETS 22Q MISC TEST BLOOD SUGAR TWICE DAILY 100 each 2  . promethazine (PHENERGAN) 25 MG tablet TAKE ONE (1) TABLET(S) EVERY FOUR (4) HOURS AS NEEDED FOR NAUSEA 60 tablet 0  . rOPINIRole (REQUIP) 1 MG tablet TAKE 1 TABLET BY MOUTH EVERY NIGHT AT BEDTIME 30 tablet 3  . amoxicillin (AMOXIL) 875 MG tablet Take 1 tablet (875 mg total) by mouth 2 (two) times daily. 14 tablet 0  . atorvastatin (LIPITOR) 40 MG tablet Take 1.5 tablets (60 mg total) by mouth daily. 45 tablet 3  . Tamsulosin HCl (FLOMAX) 0.4 MG CAPS TAKE ONE CAPSULE BY MOUTH DAILY AFTER SUPPER 30 capsule 11   No current facility-administered medications for this visit.     Allergies (verified) Review of patient's allergies indicates no known allergies.   PAST HISTORY  Family History Family History  Problem Relation Age of Onset  . Colon cancer Neg Hx   . Stomach cancer Neg Hx   . Esophageal cancer Neg Hx   . Rectal cancer Neg Hx   . Diabetes Mother   . Heart disease Mother     MI  . Cancer Father     prostate    Social History History  Substance Use Topics  . Smoking status: Never Smoker   . Smokeless tobacco: Never Used  . Alcohol Use: No    Are there smokers in your home  (other than you)?  No  Risk Factors Current exercise habits: Gym/ health club routine includes low impact aerobics and treadmill.  Dietary issues discussed: well-balanced diet.  Good fluid intake.   Cardiac risk factors: advanced age (older than 53 for men, 25 for women), diabetes mellitus, dyslipidemia, family history of premature cardiovascular disease, hypertension and male gender.  Depression Screen (Note: if answer to either of the following  is "Yes", a more complete depression screening is indicated)   Q1: Over the past two weeks, have you felt down, depressed or hopeless? No  Q2: Over the past two weeks, have you felt little interest or pleasure in doing things? No  Have you lost interest or pleasure in daily life? No  Do you often feel hopeless? No  Do you cry easily over simple problems? No  Activities of Daily Living In your present state of health, do you have any difficulty performing the following activities?:  Driving? No Managing money?  No Feeding yourself? No Getting from bed to chair? No Climbing a flight of stairs? No Preparing food and eating?: No Bathing or showering? No Getting dressed: No Getting to the toilet? No Using the toilet:No Moving around from place to place: No In the past year have you fallen or had a near fall?:No   Are you sexually active?  No  Do you have more than one partner?  Not Applicable  Hearing Difficulties: Yes Do you often ask people to speak up or repeat themselves? Yes Do you experience ringing or noises in your ears? No Do you have difficulty understanding soft or whispered voices? Yes   Do you feel that you have a problem with memory? No  Do you often misplace items? No  Do you feel safe at home?  Yes  Cognitive Testing  Alert? Yes  Normal Appearance?Yes  Oriented to person? Yes  Place? Yes   Time? Yes  Recall of three objects?  Yes  Can perform simple calculations? Yes  Displays appropriate judgment?Yes  Can read  the correct time from a watch face?Yes   Advanced Directives have been discussed with the patient? No   List the Names of Other Physician/Practitioners you currently use: 1.    Indicate any recent Medical Services you may have received from other than [facility] providers in the past year (date may be approximate).  Immunization History  Administered Date(s) Administered  . Td 12/04/1997  . Tdap 01/30/2012    Screening Tests Health Maintenance  Topic Date Due  . FOOT EXAM  01/11/1956  . URINE MICROALBUMIN  01/11/1956  . INFLUENZA VACCINE  12/03/2015 (Originally 07/04/2014)  . HEMOGLOBIN A1C  01/15/2015  . OPHTHALMOLOGY EXAM  10/01/2015  . COLONOSCOPY  05/28/2017  . TETANUS/TDAP  01/29/2022  . PNEUMOCOCCAL POLYSACCHARIDE VACCINE AGE 28 AND OVER  Addressed  . ZOSTAVAX  Addressed    All answers were reviewed with the patient and necessary referrals were made:  Leeanne Rio, PA-C   11/11/2014   History reviewed: allergies, current medications, past family history, past medical history, past social history, past surgical history and problem list  Review of Systems A comprehensive review of systems was negative.    Objective:     Vision by Snellen chart: right eye:20/20, left eye:20/20 With corrective lenses Blood pressure 168/102, pulse 73, temperature 98 F (36.7 C), height 5' 11.5" (1.816 m), weight 225 lb (102.059 kg), SpO2 97 %. Body mass index is 30.95 kg/(m^2).  BP 168/102 mmHg  Pulse 73  Temp(Src) 98 F (36.7 C)  Ht 5' 11.5" (1.816 m)  Wt 225 lb (102.059 kg)  BMI 30.95 kg/m2  SpO2 97%  General Appearance:    Alert, cooperative, no distress, appears stated age  Head:    Normocephalic, without obvious abnormality, atraumatic  Eyes:    PERRL, conjunctiva/corneas clear, EOM's intact, fundi    benign, both eyes       Ears:  Normal TM's and external ear canals, both ears  Nose:   Nares normal, septum midline, mucosa normal, no drainage    or sinus tenderness   Throat:   Lips, mucosa, and tongue normal; teeth and gums normal  Neck:   Supple, symmetrical, trachea midline, no adenopathy;       thyroid:  No enlargement/tenderness/nodules; no carotid   bruit or JVD  Back:     Symmetric, no curvature, ROM normal, no CVA tenderness  Lungs:     Clear to auscultation bilaterally, respirations unlabored  Chest wall:    No tenderness or deformity  Heart:    Regular rate and rhythm, S1 and S2 normal, no murmur, rub   or gallop  Abdomen:     Soft, non-tender, bowel sounds active all four quadrants,    no masses, no organomegaly  Genitalia:    Normal male without lesion, discharge or tenderness  Rectal:    Normal tone, normal prostate, no masses or tenderness;   guaiac negative stool  Extremities:   Extremities normal, atraumatic, no cyanosis or edema  Pulses:   2+ and symmetric all extremities  Skin:   Skin color, texture, turgor normal, no rashes or lesions  Lymph nodes:   Cervical, supraclavicular, and axillary nodes normal  Neurologic:   CNII-XII intact. Normal strength, sensation and reflexes      throughout       Assessment:      (1) Medicare Wellness, Subsequent   (2) Acute Bronchitis     Plan:     (1) During the course of the visit the patient was educated and counseled about appropriate screening and preventive services including:    Influenza vaccine  Prostate cancer screening  Colorectal cancer screening  Diabetes screening  Nutrition counseling    Will obtain fasting labs today.  (2) Rx Amoxicillin. Increase fluids. Rest. Saline nasal spray. Plain Mucinex or Coricidin HBP.  Humidifier in bedroom.  Return precautions discussed with patient.   Patient Instructions (the written plan) was given to the patient.  Medicare Attestation I have personally reviewed: The patient's medical and social history Their use of alcohol, tobacco or illicit drugs Their current medications and supplements The patient's functional ability  including ADLs,fall risks, home safety risks, cognitive, and hearing and visual impairment Diet and physical activities Evidence for depression or mood disorders  The patient's weight, height, BMI, and visual acuity have been recorded in the chart.  I have made referrals, counseling, and provided education to the patient based on review of the above and I have provided the patient with a written personalized care plan for preventive services.     [name] [place], [place]   11/11/2014

## 2014-11-16 ENCOUNTER — Other Ambulatory Visit: Payer: Self-pay | Admitting: Family Medicine

## 2014-11-16 ENCOUNTER — Telehealth: Payer: Self-pay | Admitting: *Deleted

## 2014-11-16 NOTE — Telephone Encounter (Signed)
PA approved through 12/04/2015 

## 2014-11-16 NOTE — Telephone Encounter (Signed)
PA for atorvastatin initiated on Friday 11/13/2014. Awaiting determination. JG//CMA

## 2015-01-19 ENCOUNTER — Other Ambulatory Visit: Payer: Self-pay | Admitting: Family Medicine

## 2015-02-18 ENCOUNTER — Other Ambulatory Visit: Payer: Self-pay | Admitting: Family Medicine

## 2015-03-17 ENCOUNTER — Other Ambulatory Visit: Payer: Self-pay | Admitting: Physician Assistant

## 2015-03-17 ENCOUNTER — Other Ambulatory Visit: Payer: Self-pay | Admitting: Family Medicine

## 2015-03-19 ENCOUNTER — Other Ambulatory Visit: Payer: Self-pay | Admitting: Family Medicine

## 2015-03-20 ENCOUNTER — Other Ambulatory Visit: Payer: Self-pay | Admitting: Family Medicine

## 2015-03-22 ENCOUNTER — Telehealth: Payer: Self-pay | Admitting: *Deleted

## 2015-03-22 NOTE — Telephone Encounter (Signed)
PA approved effective through 12/04/2015. JG//CMA

## 2015-03-22 NOTE — Telephone Encounter (Signed)
Prior authorization for dutasteride initiated. Awaiting determination. JG//CMA

## 2015-04-06 ENCOUNTER — Other Ambulatory Visit: Payer: Self-pay | Admitting: Family Medicine

## 2015-04-20 ENCOUNTER — Other Ambulatory Visit: Payer: Self-pay | Admitting: Family Medicine

## 2015-04-23 ENCOUNTER — Other Ambulatory Visit: Payer: Self-pay | Admitting: Family Medicine

## 2015-04-23 MED ORDER — DICYCLOMINE HCL 20 MG PO TABS: 20.0000 mg | ORAL_TABLET | Freq: Four times a day (QID) | ORAL | Status: DC

## 2015-05-18 ENCOUNTER — Other Ambulatory Visit: Payer: Self-pay | Admitting: Family Medicine

## 2015-06-15 ENCOUNTER — Other Ambulatory Visit: Payer: Self-pay | Admitting: Family Medicine

## 2015-07-07 ENCOUNTER — Other Ambulatory Visit: Payer: Self-pay | Admitting: Family Medicine

## 2015-07-07 NOTE — Telephone Encounter (Signed)
He can have a 30 day supply with 1 rf on all 4 meds but he needs an appt. I have not seen since August and he had a AWV in Dec but he really needs labwork and follow up.

## 2015-07-07 NOTE — Telephone Encounter (Signed)
Please advise  LOV-07/20/14 Labs- 11/06/14  Bentyl rx states pt needs an appointment before refills.

## 2015-07-08 NOTE — Telephone Encounter (Signed)
Please book patient a f/u appointment first available. Thank you.

## 2015-07-16 NOTE — Telephone Encounter (Signed)
LVM advising patient to schedule follow up appointment

## 2015-07-19 ENCOUNTER — Other Ambulatory Visit: Payer: Self-pay | Admitting: Family Medicine

## 2015-07-20 ENCOUNTER — Other Ambulatory Visit: Payer: Self-pay | Admitting: Family Medicine

## 2015-07-20 MED ORDER — GLUCOSE BLOOD VI STRP: ORAL_STRIP | Status: DC

## 2015-08-18 ENCOUNTER — Telehealth: Payer: Self-pay | Admitting: *Deleted

## 2015-08-18 MED ORDER — ATORVASTATIN CALCIUM 40 MG PO TABS: ORAL_TABLET | ORAL | Status: DC

## 2015-08-18 NOTE — Telephone Encounter (Signed)
Refill sent per [facility] refill protocol/SLS  

## 2015-09-11 ENCOUNTER — Other Ambulatory Visit: Payer: Self-pay | Admitting: Physician Assistant

## 2015-09-12 NOTE — Telephone Encounter (Signed)
Will defer further refills of patient's medications to PCP  

## 2015-09-13 ENCOUNTER — Other Ambulatory Visit: Payer: Self-pay | Admitting: Family Medicine

## 2015-09-13 MED ORDER — ESCITALOPRAM OXALATE 10 MG PO TABS: 10.0000 mg | ORAL_TABLET | Freq: Every day | ORAL | Status: DC

## 2015-09-13 MED ORDER — DICYCLOMINE HCL 20 MG PO TABS: ORAL_TABLET | ORAL | Status: DC

## 2015-09-13 MED ORDER — DUTASTERIDE 0.5 MG PO CAPS: 0.5000 mg | ORAL_CAPSULE | Freq: Every day | ORAL | Status: DC

## 2015-09-13 MED ORDER — ROPINIROLE HCL 0.5 MG PO TABS
ORAL_TABLET | ORAL | Status: DC
Start: 2015-09-13 — End: 2015-10-12

## 2015-09-25 ENCOUNTER — Telehealth: Payer: Self-pay | Admitting: Physician Assistant

## 2015-09-25 NOTE — Telephone Encounter (Signed)
Will defer further refills of patient's medications to PCP  

## 2015-09-27 ENCOUNTER — Other Ambulatory Visit: Payer: Self-pay | Admitting: Family Medicine

## 2015-09-27 MED ORDER — ONETOUCH DELICA LANCETS 33G MISC: Status: DC

## 2015-09-30 MED ORDER — ATORVASTATIN CALCIUM 40 MG PO TABS: ORAL_TABLET | ORAL | Status: DC

## 2015-09-30 NOTE — Telephone Encounter (Signed)
Refill done.  

## 2015-09-30 NOTE — Telephone Encounter (Signed)
Pt called and scheduled for 10/12/15 5:15pm. He is out of atorvastatin. [facility] in [place] ([place])

## 2015-09-30 NOTE — Addendum Note (Signed)
Addended by: Sharon Seller B on: 09/30/2015 10:55 AM   Modules accepted: Orders

## 2015-10-12 ENCOUNTER — Ambulatory Visit (INDEPENDENT_AMBULATORY_CARE_PROVIDER_SITE_OTHER): Payer: Medicare Other | Admitting: Family Medicine

## 2015-10-12 ENCOUNTER — Encounter: Payer: Self-pay | Admitting: Family Medicine

## 2015-10-12 VITALS — BP 172/110 | HR 69 | Temp 98.2°F | Ht 72.0 in | Wt 220.1 lb

## 2015-10-12 DIAGNOSIS — N529 Male erectile dysfunction, unspecified: Secondary | ICD-10-CM

## 2015-10-12 DIAGNOSIS — R972 Elevated prostate specific antigen [PSA]: Secondary | ICD-10-CM

## 2015-10-12 DIAGNOSIS — E559 Vitamin D deficiency, unspecified: Secondary | ICD-10-CM

## 2015-10-12 DIAGNOSIS — M549 Dorsalgia, unspecified: Secondary | ICD-10-CM | POA: Diagnosis not present

## 2015-10-12 DIAGNOSIS — I10 Essential (primary) hypertension: Secondary | ICD-10-CM

## 2015-10-12 DIAGNOSIS — G2581 Restless legs syndrome: Secondary | ICD-10-CM | POA: Diagnosis not present

## 2015-10-12 DIAGNOSIS — E118 Type 2 diabetes mellitus with unspecified complications: Secondary | ICD-10-CM

## 2015-10-12 DIAGNOSIS — D649 Anemia, unspecified: Secondary | ICD-10-CM

## 2015-10-12 DIAGNOSIS — E785 Hyperlipidemia, unspecified: Secondary | ICD-10-CM

## 2015-10-12 DIAGNOSIS — M109 Gout, unspecified: Secondary | ICD-10-CM

## 2015-10-12 MED ORDER — TAMSULOSIN HCL 0.4 MG PO CAPS: ORAL_CAPSULE | ORAL | Status: DC

## 2015-10-12 MED ORDER — ATORVASTATIN CALCIUM 40 MG PO TABS: ORAL_TABLET | ORAL | Status: DC

## 2015-10-12 MED ORDER — METOPROLOL TARTRATE 25 MG PO TABS: 25.0000 mg | ORAL_TABLET | Freq: Two times a day (BID) | ORAL | Status: DC

## 2015-10-12 MED ORDER — ESCITALOPRAM OXALATE 10 MG PO TABS
10.0000 mg | ORAL_TABLET | Freq: Every day | ORAL | Status: DC
Start: 2015-10-12 — End: 2015-12-14

## 2015-10-12 MED ORDER — DICYCLOMINE HCL 20 MG PO TABS: ORAL_TABLET | ORAL | Status: DC

## 2015-10-12 MED ORDER — ROPINIROLE HCL 1 MG PO TABS: 1.0000 mg | ORAL_TABLET | Freq: Every day | ORAL | Status: DC

## 2015-10-12 MED ORDER — METFORMIN HCL 500 MG PO TABS: 500.0000 mg | ORAL_TABLET | Freq: Two times a day (BID) | ORAL | Status: DC

## 2015-10-12 MED ORDER — HYDROCODONE-ACETAMINOPHEN 10-325 MG PO TABS: 1.0000 | ORAL_TABLET | Freq: Four times a day (QID) | ORAL | Status: DC | PRN

## 2015-10-12 NOTE — Progress Notes (Signed)
Pre visit review using our clinic review tool, if applicable. No additional management support is needed unless otherwise documented below in the visit note. 

## 2015-10-12 NOTE — Patient Instructions (Signed)
Requip 1 tab po for a week then increase to 2 tabs  Hypertension Hypertension, commonly called high blood pressure, is when the force of blood pumping through your arteries is too strong. Your arteries are the blood vessels that carry blood from your heart throughout your body. A blood pressure reading consists of a higher number over a lower number, such as 110/72. The higher number (systolic) is the pressure inside your arteries when your heart pumps. The lower number (diastolic) is the pressure inside your arteries when your heart relaxes. Ideally you want your blood pressure below 120/80. Hypertension forces your heart to work harder to pump blood. Your arteries may become narrow or stiff. Having untreated or uncontrolled hypertension can cause heart attack, stroke, kidney disease, and other problems. RISK FACTORS Some risk factors for high blood pressure are controllable. Others are not.  Risk factors you cannot control include:   Race. You may be at higher risk if you are African American.  Age. Risk increases with age.  Gender. Men are at higher risk than women before age 3 years. After age 88, women are at higher risk than men. Risk factors you can control include:  Not getting enough exercise or physical activity.  Being overweight.  Getting too much fat, sugar, calories, or salt in your diet.  Drinking too much alcohol. SIGNS AND SYMPTOMS Hypertension does not usually cause signs or symptoms. Extremely high blood pressure (hypertensive crisis) may cause headache, anxiety, shortness of breath, and nosebleed. DIAGNOSIS To check if you have hypertension, your health care provider will measure your blood pressure while you are seated, with your arm held at the level of your heart. It should be measured at least twice using the same arm. Certain conditions can cause a difference in blood pressure between your right and left arms. A blood pressure reading that is higher than normal on  one occasion does not mean that you need treatment. If it is not clear whether you have high blood pressure, you may be asked to return on a different day to have your blood pressure checked again. Or, you may be asked to monitor your blood pressure at home for 1 or more weeks. TREATMENT Treating high blood pressure includes making lifestyle changes and possibly taking medicine. Living a healthy lifestyle can help lower high blood pressure. You may need to change some of your habits. Lifestyle changes may include:  Following the DASH diet. This diet is high in fruits, vegetables, and whole grains. It is low in salt, red meat, and added sugars.  Keep your sodium intake below 2,300 mg per day.  Getting at least 30-45 minutes of aerobic exercise at least 4 times per week.  Losing weight if necessary.  Not smoking.  Limiting alcoholic beverages.  Learning ways to reduce stress. Your health care provider may prescribe medicine if lifestyle changes are not enough to get your blood pressure under control, and if one of the following is true:  You are 53-22 years of age and your systolic blood pressure is above 140.  You are 87 years of age or older, and your systolic blood pressure is above 150.  Your diastolic blood pressure is above 90.  You have diabetes, and your systolic blood pressure is over 409 or your diastolic blood pressure is over 90.  You have kidney disease and your blood pressure is above 140/90.  You have heart disease and your blood pressure is above 140/90. Your personal target blood pressure may vary  depending on your medical conditions, your age, and other factors. HOME CARE INSTRUCTIONS  Have your blood pressure rechecked as directed by your health care provider.   Take medicines only as directed by your health care provider. Follow the directions carefully. Blood pressure medicines must be taken as prescribed. The medicine does not work as well when you skip doses.  Skipping doses also puts you at risk for problems.  Do not smoke.   Monitor your blood pressure at home as directed by your health care provider. SEEK MEDICAL CARE IF:   You think you are having a reaction to medicines taken.  You have recurrent headaches or feel dizzy.  You have swelling in your ankles.  You have trouble with your vision. SEEK IMMEDIATE MEDICAL CARE IF:  You develop a severe headache or confusion.  You have unusual weakness, numbness, or feel faint.  You have severe chest or abdominal pain.  You vomit repeatedly.  You have trouble breathing. MAKE SURE YOU:   Understand these instructions.  Will watch your condition.  Will get help right away if you are not doing well or get worse.   This information is not intended to replace advice given to you by your health care provider. Make sure you discuss any questions you have with your health care provider.   Document Released: 11/20/2005 Document Revised: 04/06/2015 Document Reviewed: 09/12/2013 Elsevier Interactive Patient Education [employer].

## 2015-10-15 ENCOUNTER — Ambulatory Visit (INDEPENDENT_AMBULATORY_CARE_PROVIDER_SITE_OTHER): Payer: Medicare Other | Admitting: Family Medicine

## 2015-10-15 ENCOUNTER — Other Ambulatory Visit (INDEPENDENT_AMBULATORY_CARE_PROVIDER_SITE_OTHER): Payer: Medicare Other

## 2015-10-15 VITALS — BP 160/106 | HR 63

## 2015-10-15 DIAGNOSIS — M5489 Other dorsalgia: Secondary | ICD-10-CM

## 2015-10-15 DIAGNOSIS — E559 Vitamin D deficiency, unspecified: Secondary | ICD-10-CM

## 2015-10-15 DIAGNOSIS — I10 Essential (primary) hypertension: Secondary | ICD-10-CM

## 2015-10-15 DIAGNOSIS — R030 Elevated blood-pressure reading, without diagnosis of hypertension: Principal | ICD-10-CM

## 2015-10-15 DIAGNOSIS — IMO0001 Reserved for inherently not codable concepts without codable children: Secondary | ICD-10-CM

## 2015-10-15 DIAGNOSIS — D508 Other iron deficiency anemias: Secondary | ICD-10-CM

## 2015-10-15 DIAGNOSIS — Z131 Encounter for screening for diabetes mellitus: Secondary | ICD-10-CM

## 2015-10-15 DIAGNOSIS — R972 Elevated prostate specific antigen [PSA]: Secondary | ICD-10-CM

## 2015-10-15 LAB — COMPREHENSIVE METABOLIC PANEL
ALK PHOS: 91 U/L (ref 40–115)
ALT: 21 U/L (ref 9–46)
AST: 29 U/L (ref 10–35)
Albumin: 4.1 g/dL (ref 3.6–5.1)
BILIRUBIN TOTAL: 0.5 mg/dL (ref 0.2–1.2)
BUN: 13 mg/dL (ref 7–25)
CO2: 21 mmol/L (ref 20–31)
CREATININE: 1.32 mg/dL — AB (ref 0.70–1.18)
Calcium: 9.3 mg/dL (ref 8.6–10.3)
Chloride: 102 mmol/L (ref 98–110)
GLUCOSE: 83 mg/dL (ref 65–99)
Potassium: 4.2 mmol/L (ref 3.5–5.3)
Sodium: 137 mmol/L (ref 135–146)
TOTAL PROTEIN: 7.3 g/dL (ref 6.1–8.1)

## 2015-10-15 LAB — CBC WITH DIFFERENTIAL/PLATELET
BASOS ABS: 0 10*3/uL (ref 0.0–0.1)
BASOS PCT: 0 % (ref 0–1)
EOS PCT: 1 % (ref 0–5)
Eosinophils Absolute: 0.1 10*3/uL (ref 0.0–0.7)
HEMATOCRIT: 41.5 % (ref 39.0–52.0)
HEMOGLOBIN: 13.9 g/dL (ref 13.0–17.0)
LYMPHS PCT: 43 % (ref 12–46)
Lymphs Abs: 2.5 10*3/uL (ref 0.7–4.0)
MCH: 25.2 pg — ABNORMAL LOW (ref 26.0–34.0)
MCHC: 33.5 g/dL (ref 30.0–36.0)
MCV: 75.3 fL — ABNORMAL LOW (ref 78.0–100.0)
MONO ABS: 0.5 10*3/uL (ref 0.1–1.0)
MPV: 10.1 fL (ref 8.6–12.4)
Monocytes Relative: 9 % (ref 3–12)
NEUTROS ABS: 2.8 10*3/uL (ref 1.7–7.7)
Neutrophils Relative %: 47 % (ref 43–77)
Platelets: 183 10*3/uL (ref 150–400)
RBC: 5.51 MIL/uL (ref 4.22–5.81)
RDW: 16.3 % — AB (ref 11.5–15.5)
WBC: 5.9 10*3/uL (ref 4.0–10.5)

## 2015-10-15 LAB — LIPID PANEL
CHOLESTEROL: 132 mg/dL (ref 125–200)
HDL: 42 mg/dL (ref >=40)
LDL Cholesterol: 49 mg/dL (ref <130)
Total CHOL/HDL Ratio: 3.1 Ratio (ref <=5.0)
Triglycerides: 204 mg/dL — ABNORMAL HIGH (ref <150)
VLDL: 41 mg/dL — ABNORMAL HIGH (ref <30)

## 2015-10-15 LAB — URIC ACID: Uric Acid, Serum: 6.6 mg/dL (ref 4.0–7.8)

## 2015-10-15 LAB — HEMOGLOBIN A1C
HEMOGLOBIN A1C: 6.5 % — AB (ref <5.7)
MEAN PLASMA GLUCOSE: 140 mg/dL — AB (ref <117)

## 2015-10-15 MED ORDER — METOPROLOL TARTRATE 12.5 MG HALF TABLET
25.0000 mg | ORAL_TABLET | Freq: Once | ORAL | Status: AC
  Administered 2015-10-15: 25 mg via ORAL

## 2015-10-15 NOTE — Progress Notes (Signed)
Pre visit review using our clinic review tool, if applicable. No additional management support is needed unless otherwise documented below in the visit note.  Patient presents today for a blood pressure check per the provider's office visit note on 10/12/15. Patient expressed during the visit that he had not purchased the medication due to cost.  Readings are as follow: B/P 162/109 P 70        B/P 160/106 P 63  Per Dr. Charlett Blake: Patient given Metoprolol Tartrate 25 MG in office. Inform patient of the importance of purchasing and taking this medication as ordered. Follow-up in 2 weeks for a blood pressure recheck. Patient voiced understanding. Patient did not want to schedule at this time. He will call back.  Note reviewed and agree with plan of care

## 2015-10-16 LAB — PSA: PSA: 4.54 ng/mL — AB (ref <=4.00)

## 2015-10-16 LAB — TSH: TSH: 3.688 u[IU]/mL (ref 0.350–4.500)

## 2015-10-16 LAB — MICROALBUMIN / CREATININE URINE RATIO
CREATININE, URINE: 141 mg/dL (ref 20–370)
Microalb Creat Ratio: 12 mcg/mg creat (ref <30)
Microalb, Ur: 1.7 mg/dL

## 2015-10-16 LAB — VITAMIN D 25 HYDROXY (VIT D DEFICIENCY, FRACTURES): VIT D 25 HYDROXY: 30 ng/mL (ref 30–100)

## 2015-10-23 NOTE — Assessment & Plan Note (Addendum)
Not controlled, add Metoprolol. Encouraged heart healthy diet such as the DASH diet and exercise as tolerated.

## 2015-10-23 NOTE — Progress Notes (Signed)
Subjective:    Patient ID: Daniel Gates, male    DOB: December 30, 1945, 69 y.o.   MRN: [id number]  Chief Complaint  Patient presents with  . Follow-up    HPI Patient is in today for follow-up. He reports feeling well. No recent illness. No acute concerns. Continues to struggle with left knee pain but no recent injury or fall. No significant warmth redness or swelling. Denies CP/palp/SOB/HA/congestion/fevers/GI or GU c/o.   Past Medical History  Diagnosis Date  . Glaucoma   . Cancer ([place])     colon; diagnosed 2003  . Depression   . Hypertension   . Allergic rhinitis   . Hyperlipidemia   . Gout   . Diabetes type 2, controlled ([place]) 06/27/2012  . Medicare annual wellness visit, subsequent 02/15/2014    Past Surgical History  Procedure Laterality Date  . Inguinal hernia repair  1997    right  . Colon surgery  2003    for colon cancer  . Colonoscopy      Family History  Problem Relation Age of Onset  . Colon cancer Neg Hx   . Stomach cancer Neg Hx   . Esophageal cancer Neg Hx   . Rectal cancer Neg Hx   . Diabetes Mother   . Heart disease Mother     MI  . Cancer Father     prostate    Social History   Social History  . Marital Status: Widowed    Spouse Name: N/A  . Number of Children: 7  . Years of Education: N/A   Occupational History  .     Social History Main Topics  . Smoking status: Never Smoker   . Smokeless tobacco: Never Used  . Alcohol Use: No  . Drug Use: No  . Sexual Activity: Yes   Other Topics Concern  . Not on file   Social History Narrative    Outpatient Prescriptions Prior to Visit  Medication Sig Dispense Refill  . aspirin 81 MG tablet Take 81 mg by mouth daily.      . COD LIVER OIL PO Take by mouth.      . Garlic Oil 3 MG CAPS Take by mouth.      Marland Kitchen glucose blood (ONE TOUCH ULTRA TEST) test strip USE AS DIRECTED TO TEST BLOOD SUGAR TWICE DAILY DX E11.9 100 each 1  . indomethacin (INDOCIN SR) 75 MG CR capsule Take 1 capsule (75 mg  total) by mouth 3 (three) times daily as needed. 90 capsule 6  . latanoprost (XALATAN) 0.005 % ophthalmic solution 1 drop at bedtime. Use as directed     . losartan (COZAAR) 100 MG tablet TAKE 1 TABLET BY MOUTH DAILY 90 tablet 0  . Multiple Vitamins-Minerals (ONE-A-DAY MENS HEALTH FORMULA) TABS Take 1 tablet by mouth daily.    Glory Rosebush DELICA LANCETS [id number] MISC USE AS DIRECTED TO TEST BLOOD SUGAR TWICE DAILY 100 each 0  . promethazine (PHENERGAN) 25 MG tablet TAKE ONE (1) TABLET(S) EVERY FOUR (4) HOURS AS NEEDED FOR NAUSEA 60 tablet 0  . atorvastatin (LIPITOR) 40 MG tablet TAKE 1&1/2 TABLETS BY MOUTH DAILY 45 tablet 0  . dicyclomine (BENTYL) 20 MG tablet TAKE 1 TABLET BY MOUTH EVERY 6 HOURS** NEEDS APPOINTMENT** 120 tablet 0  . escitalopram (LEXAPRO) 10 MG tablet Take 1 tablet (10 mg total) by mouth daily. 30 tablet 0  . rOPINIRole (REQUIP) 0.5 MG tablet TAKE 1 TABLET BY MOUTH EVERY NIGHT AT BEDTIME AS NEEDED FOR RESTLESS  LEGS 30 tablet 0  . Tamsulosin HCl (FLOMAX) 0.4 MG CAPS TAKE ONE CAPSULE BY MOUTH DAILY AFTER SUPPER 30 capsule 11  . dutasteride (AVODART) 0.5 MG capsule TAKE ONE CAPSULE BY MOUTH DAILY (Patient not taking: Reported on 10/12/2015) 30 capsule 1  . dutasteride (AVODART) 0.5 MG capsule Take 1 capsule (0.5 mg total) by mouth daily. (Patient not taking: Reported on 10/12/2015) 30 capsule 0  . furosemide (LASIX) 40 MG tablet Take 1 tablet (40 mg total) by mouth 2 (two) times daily as needed. Take 1 in the am and 1 in midafternoon for edema and BP as needed 60 tablet 11  . amoxicillin (AMOXIL) 875 MG tablet Take 1 tablet (875 mg total) by mouth 2 (two) times daily. 14 tablet 0  . HYDROcodone-acetaminophen (NORCO) 10-325 MG per tablet Take 1 tablet by mouth every 6 (six) hours as needed for moderate pain or severe pain. (Patient not taking: Reported on 10/12/2015) 120 tablet 0  . metFORMIN (GLUCOPHAGE) 500 MG tablet Take 1 tablet (500 mg total) by mouth 2 (two) times daily with a meal.  (Patient not taking: Reported on 10/12/2015) 180 tablet 3   No facility-administered medications prior to visit.    No Known Allergies  Review of Systems  Constitutional: Negative for fever and malaise/fatigue.  HENT: Negative for congestion.   Eyes: Negative for discharge.  Respiratory: Negative for shortness of breath.   Cardiovascular: Negative for chest pain, palpitations and leg swelling.  Gastrointestinal: Negative for nausea and abdominal pain.  Genitourinary: Negative for dysuria.  Musculoskeletal: Negative for falls.  Skin: Negative for rash.  Neurological: Negative for loss of consciousness and headaches.  Endo/Heme/Allergies: Negative for environmental allergies.  Psychiatric/Behavioral: Negative for depression. The patient is not nervous/anxious.        Objective:    Physical Exam  Constitutional: He is oriented to person, place, and time. He appears well-developed and well-nourished. No distress.  HENT:  Head: Normocephalic and atraumatic.  Nose: Nose normal.  Eyes: Right eye exhibits no discharge. Left eye exhibits no discharge.  Neck: Normal range of motion. Neck supple.  Cardiovascular: Normal rate and regular rhythm.   No murmur heard. Pulmonary/Chest: Effort normal and breath sounds normal.  Abdominal: Soft. Bowel sounds are normal. There is no tenderness.  Musculoskeletal: He exhibits no edema.  Neurological: He is alert and oriented to person, place, and time.  Skin: Skin is warm and dry.  Psychiatric: He has a normal mood and affect.  Nursing note and vitals reviewed.   BP 172/110 mmHg  Pulse 69  Temp(Src) 98.2 F (36.8 C) (Oral)  Ht 6' (1.829 m)  Wt 220 lb 2 oz (99.848 kg)  BMI 29.85 kg/m2  SpO2 99% Wt Readings from Last 3 Encounters:  10/12/15 220 lb 2 oz (99.848 kg)  11/06/14 225 lb (102.059 kg)  07/20/14 213 lb 1.9 oz (96.671 kg)     Lab Results  Component Value Date   WBC 5.9 10/15/2015   HGB 13.9 10/15/2015   HCT 41.5 10/15/2015     PLT 183 10/15/2015   GLUCOSE 83 10/15/2015   CHOL 132 10/15/2015   TRIG 204* 10/15/2015   HDL 42 10/15/2015   LDLDIRECT 59.3 11/06/2014   LDLCALC 49 10/15/2015   ALT 21 10/15/2015   AST 29 10/15/2015   NA 137 10/15/2015   K 4.2 10/15/2015   CL 102 10/15/2015   CREATININE 1.32* 10/15/2015   BUN 13 10/15/2015   CO2 21 10/15/2015   TSH 3.688 10/15/2015  PSA 4.54* 10/15/2015   HGBA1C 6.5* 10/15/2015   MICROALBUR 1.7 10/15/2015    Lab Results  Component Value Date   TSH 3.688 10/15/2015   Lab Results  Component Value Date   WBC 5.9 10/15/2015   HGB 13.9 10/15/2015   HCT 41.5 10/15/2015   MCV 75.3* 10/15/2015   PLT 183 10/15/2015   Lab Results  Component Value Date   NA 137 10/15/2015   K 4.2 10/15/2015   CO2 21 10/15/2015   GLUCOSE 83 10/15/2015   BUN 13 10/15/2015   CREATININE 1.32* 10/15/2015   BILITOT 0.5 10/15/2015   ALKPHOS 91 10/15/2015   AST 29 10/15/2015   ALT 21 10/15/2015   PROT 7.3 10/15/2015   ALBUMIN 4.1 10/15/2015   CALCIUM 9.3 10/15/2015   GFR 60.94 11/06/2014   Lab Results  Component Value Date   CHOL 132 10/15/2015   Lab Results  Component Value Date   HDL 42 10/15/2015   Lab Results  Component Value Date   LDLCALC 49 10/15/2015   Lab Results  Component Value Date   TRIG 204* 10/15/2015   Lab Results  Component Value Date   CHOLHDL 3.1 10/15/2015   Lab Results  Component Value Date   HGBA1C 6.5* 10/15/2015       Assessment & Plan:   Problem List Items Addressed This Visit    ACUTE GOUTY ARTHROPATHY   Relevant Medications   atorvastatin (LIPITOR) 40 MG tablet   escitalopram (LEXAPRO) 10 MG tablet   tamsulosin (FLOMAX) 0.4 MG CAPS capsule   metFORMIN (GLUCOPHAGE) 500 MG tablet   dicyclomine (BENTYL) 20 MG tablet   HYDROcodone-acetaminophen (NORCO) 10-325 MG tablet   rOPINIRole (REQUIP) 1 MG tablet   Other Relevant Orders   TSH   CBC   Hemoglobin A1c   Microalbumin / creatinine urine ratio   Comprehensive  metabolic panel   Lipid panel   PSA   Vitamin D (25 hydroxy)   Uric acid   ANEMIA    resolved      Relevant Medications   atorvastatin (LIPITOR) 40 MG tablet   escitalopram (LEXAPRO) 10 MG tablet   tamsulosin (FLOMAX) 0.4 MG CAPS capsule   metFORMIN (GLUCOPHAGE) 500 MG tablet   dicyclomine (BENTYL) 20 MG tablet   HYDROcodone-acetaminophen (NORCO) 10-325 MG tablet   rOPINIRole (REQUIP) 1 MG tablet   Other Relevant Orders   TSH   CBC   Hemoglobin A1c   Microalbumin / creatinine urine ratio   Comprehensive metabolic panel   Lipid panel   PSA   Vitamin D (25 hydroxy)   Uric acid   Diabetes type 2, controlled ([facility])   Relevant Medications   atorvastatin (LIPITOR) 40 MG tablet   escitalopram (LEXAPRO) 10 MG tablet   tamsulosin (FLOMAX) 0.4 MG CAPS capsule   metFORMIN (GLUCOPHAGE) 500 MG tablet   dicyclomine (BENTYL) 20 MG tablet   HYDROcodone-acetaminophen (NORCO) 10-325 MG tablet   rOPINIRole (REQUIP) 1 MG tablet   Other Relevant Orders   TSH   CBC   Hemoglobin A1c   Microalbumin / creatinine urine ratio   Comprehensive metabolic panel   Lipid panel   PSA   Vitamin D (25 hydroxy)   Uric acid   ED (erectile dysfunction)   Relevant Medications   atorvastatin (LIPITOR) 40 MG tablet   escitalopram (LEXAPRO) 10 MG tablet   tamsulosin (FLOMAX) 0.4 MG CAPS capsule   metFORMIN (GLUCOPHAGE) 500 MG tablet   dicyclomine (BENTYL) 20 MG tablet   HYDROcodone-acetaminophen (NORCO)  10-325 MG tablet   rOPINIRole (REQUIP) 1 MG tablet   Other Relevant Orders   TSH   CBC   Hemoglobin A1c   Microalbumin / creatinine urine ratio   Comprehensive metabolic panel   Lipid panel   PSA   Vitamin D (25 hydroxy)   Uric acid   Elevated PSA   Relevant Medications   atorvastatin (LIPITOR) 40 MG tablet   escitalopram (LEXAPRO) 10 MG tablet   tamsulosin (FLOMAX) 0.4 MG CAPS capsule   metFORMIN (GLUCOPHAGE) 500 MG tablet   dicyclomine (BENTYL) 20 MG tablet   HYDROcodone-acetaminophen  (NORCO) 10-325 MG tablet   rOPINIRole (REQUIP) 1 MG tablet   Other Relevant Orders   TSH   CBC   Hemoglobin A1c   Microalbumin / creatinine urine ratio   Comprehensive metabolic panel   Lipid panel   PSA   Vitamin D (25 hydroxy)   Uric acid   Essential hypertension    Not controlled, add Metoprolol. Encouraged heart healthy diet such as the DASH diet and exercise as tolerated.       Relevant Medications   atorvastatin (LIPITOR) 40 MG tablet   metoprolol tartrate (LOPRESSOR) 25 MG tablet   Hyperlipidemia    Tolerating statin, encouraged heart healthy diet, avoid trans fats, minimize simple carbs and saturated fats. Increase exercise as tolerated      Relevant Medications   atorvastatin (LIPITOR) 40 MG tablet   metoprolol tartrate (LOPRESSOR) 25 MG tablet   PROSTATE SPECIFIC ANTIGEN, ELEVATED - Primary   Relevant Medications   atorvastatin (LIPITOR) 40 MG tablet   escitalopram (LEXAPRO) 10 MG tablet   tamsulosin (FLOMAX) 0.4 MG CAPS capsule   metFORMIN (GLUCOPHAGE) 500 MG tablet   dicyclomine (BENTYL) 20 MG tablet   HYDROcodone-acetaminophen (NORCO) 10-325 MG tablet   rOPINIRole (REQUIP) 1 MG tablet   Other Relevant Orders   TSH   CBC   Hemoglobin A1c   Microalbumin / creatinine urine ratio   Comprehensive metabolic panel   Lipid panel   PSA   Vitamin D (25 hydroxy)   Uric acid   Restless leg syndrome   Relevant Medications   atorvastatin (LIPITOR) 40 MG tablet   escitalopram (LEXAPRO) 10 MG tablet   tamsulosin (FLOMAX) 0.4 MG CAPS capsule   metFORMIN (GLUCOPHAGE) 500 MG tablet   dicyclomine (BENTYL) 20 MG tablet   HYDROcodone-acetaminophen (NORCO) 10-325 MG tablet   rOPINIRole (REQUIP) 1 MG tablet   Other Relevant Orders   TSH   CBC   Hemoglobin A1c   Microalbumin / creatinine urine ratio   Comprehensive metabolic panel   Lipid panel   PSA   Vitamin D (25 hydroxy)   Uric acid   Vitamin D deficiency   Relevant Medications   atorvastatin (LIPITOR) 40 MG  tablet   escitalopram (LEXAPRO) 10 MG tablet   tamsulosin (FLOMAX) 0.4 MG CAPS capsule   metFORMIN (GLUCOPHAGE) 500 MG tablet   dicyclomine (BENTYL) 20 MG tablet   HYDROcodone-acetaminophen (NORCO) 10-325 MG tablet   rOPINIRole (REQUIP) 1 MG tablet   Other Relevant Orders   TSH   CBC   Hemoglobin A1c   Microalbumin / creatinine urine ratio   Comprehensive metabolic panel   Lipid panel   PSA   Vitamin D (25 hydroxy)   Uric acid    Other Visit Diagnoses    Back pain, unspecified location        Relevant Medications    atorvastatin (LIPITOR) 40 MG tablet    escitalopram (LEXAPRO) 10  MG tablet    tamsulosin (FLOMAX) 0.4 MG CAPS capsule    metFORMIN (GLUCOPHAGE) 500 MG tablet    dicyclomine (BENTYL) 20 MG tablet    HYDROcodone-acetaminophen (NORCO) 10-325 MG tablet    rOPINIRole (REQUIP) 1 MG tablet    Other Relevant Orders    TSH    CBC    Hemoglobin A1c    Microalbumin / creatinine urine ratio    Comprehensive metabolic panel    Lipid panel    PSA    Vitamin D (25 hydroxy)    Uric acid       I have discontinued Mr. Jalomo amoxicillin and rOPINIRole. I have also changed his tamsulosin and HYDROcodone-acetaminophen. Additionally, I am having him start on rOPINIRole and metoprolol tartrate. Lastly, I am having him maintain his aspirin, Garlic Oil, COD LIVER OIL PO, latanoprost, promethazine, indomethacin, ONE-A-DAY MENS HEALTH FORMULA, furosemide, losartan, dutasteride, glucose blood, dutasteride, ONETOUCH DELICA LANCETS [id number], atorvastatin, escitalopram, metFORMIN, and dicyclomine.  Meds ordered this encounter  Medications  . atorvastatin (LIPITOR) 40 MG tablet    Sig: TAKE 1&1/2 TABLETS BY MOUTH DAILY    Dispense:  45 tablet    Refill:  0    Requested drug refills are authorized, however, the patient needs further evaluation and/or laboratory testing before further refills are given. Ask him to make an appointment for this.  Marland Kitchen escitalopram (LEXAPRO) 10 MG tablet     Sig: Take 1 tablet (10 mg total) by mouth daily.    Dispense:  30 tablet    Refill:  0    PATIENT NEEDS APPOINTMENT FOR FURTHER REFILLS.  Marland Kitchen tamsulosin (FLOMAX) 0.4 MG CAPS capsule    Sig: TAKE ONE CAPSULE BY MOUTH DAILY AFTER SUPPER    Dispense:  30 capsule    Refill:  11  . metFORMIN (GLUCOPHAGE) 500 MG tablet    Sig: Take 1 tablet (500 mg total) by mouth 2 (two) times daily with a meal.    Dispense:  180 tablet    Refill:  3  . dicyclomine (BENTYL) 20 MG tablet    Sig: TAKE 1 TABLET BY MOUTH EVERY 6 HOURS** NEEDS APPOINTMENT**    Dispense:  120 tablet    Refill:  0  . HYDROcodone-acetaminophen (NORCO) 10-325 MG tablet    Sig: Take 1 tablet by mouth every 6 (six) hours as needed for moderate pain or severe pain.    Dispense:  120 tablet    Refill:  0  . rOPINIRole (REQUIP) 1 MG tablet    Sig: Take 1-2 tablets (1-2 mg total) by mouth at bedtime.    Dispense:  60 tablet    Refill:  3  . metoprolol tartrate (LOPRESSOR) 25 MG tablet    Sig: Take 1 tablet (25 mg total) by mouth 2 (two) times daily.    Dispense:  60 tablet    Refill:  3     Penni Homans, MD

## 2015-10-23 NOTE — Assessment & Plan Note (Signed)
Tolerating statin, encouraged heart healthy diet, avoid trans fats, minimize simple carbs and saturated fats. Increase exercise as tolerated 

## 2015-10-23 NOTE — Assessment & Plan Note (Signed)
resolved 

## 2015-11-04 ENCOUNTER — Ambulatory Visit (INDEPENDENT_AMBULATORY_CARE_PROVIDER_SITE_OTHER): Payer: Medicare Other | Admitting: Family Medicine

## 2015-11-04 ENCOUNTER — Other Ambulatory Visit: Payer: Self-pay | Admitting: Family Medicine

## 2015-11-04 VITALS — BP 149/104 | HR 56

## 2015-11-04 DIAGNOSIS — IMO0001 Reserved for inherently not codable concepts without codable children: Secondary | ICD-10-CM

## 2015-11-04 DIAGNOSIS — R030 Elevated blood-pressure reading, without diagnosis of hypertension: Secondary | ICD-10-CM | POA: Diagnosis not present

## 2015-11-04 MED ORDER — METOPROLOL TARTRATE 25 MG PO TABS: 25.0000 mg | ORAL_TABLET | Freq: Three times a day (TID) | ORAL | Status: DC

## 2015-11-04 MED ORDER — GLUCOSE BLOOD VI STRP: ORAL_STRIP | Status: DC

## 2015-11-04 NOTE — Progress Notes (Signed)
Pre visit review using our clinic review tool, if applicable. No additional management support is needed unless otherwise documented below in the visit note.  Patient in for BP check. Started Metoprolol 25 mg daily on last office visit. Back today for BP recheck.  Patients BP medication (Metoprolol 25 mg) increased to 3 timed daily. Patient to return for BP check in 1 week. Appointment scheduled.

## 2015-11-04 NOTE — Patient Instructions (Addendum)
Increase Metoprolol 25 mg to three times daily.  Return for BP check in 1 week.

## 2015-11-04 NOTE — Progress Notes (Signed)
Note reviewed agree with plan

## 2015-11-07 NOTE — Progress Notes (Signed)
RN blood check note reviewed. Agree with documention and plan. 

## 2015-11-11 ENCOUNTER — Ambulatory Visit (INDEPENDENT_AMBULATORY_CARE_PROVIDER_SITE_OTHER): Payer: Medicare Other | Admitting: Family Medicine

## 2015-11-11 VITALS — BP 164/107 | HR 57

## 2015-11-11 DIAGNOSIS — I10 Essential (primary) hypertension: Secondary | ICD-10-CM | POA: Diagnosis not present

## 2015-11-11 MED ORDER — HYDROCHLOROTHIAZIDE 25 MG PO TABS: 25.0000 mg | ORAL_TABLET | Freq: Every day | ORAL | Status: DC

## 2015-11-11 NOTE — Progress Notes (Signed)
RN blood check note reviewed. Agree with documention and plan. 

## 2015-11-11 NOTE — Progress Notes (Signed)
Pre visit review using our clinic review tool, if applicable. No additional management support is needed unless otherwise documented below in the visit note.  Patient presents today for a blood pressure check per office visit note 11/04/15. Readings were as follow: B/P 151/103 P 57                B/P 164/107 P 57  Per Dr. Charlett Blake: Take Hydrochlorothiazide 25 mg once daily, in addition to the current medication regimen and return to have blood pressure rechecked in the next 1-2 weeks. Informed patient of the provider's recommendations. He voiced understanding and did not have any questions or concerns. Rx sent to the patient's preferred pharmacy. Appointment scheduled for 11/19/15 at 3:15 PM.

## 2015-11-14 NOTE — Progress Notes (Signed)
RN blood check note reviewed. Agree with documention and plan. 

## 2015-11-19 ENCOUNTER — Ambulatory Visit (INDEPENDENT_AMBULATORY_CARE_PROVIDER_SITE_OTHER): Payer: Medicare Other | Admitting: Medical

## 2015-11-19 ENCOUNTER — Telehealth: Payer: Self-pay

## 2015-11-19 VITALS — BP 130/92 | HR 59

## 2015-11-19 DIAGNOSIS — I10 Essential (primary) hypertension: Secondary | ICD-10-CM

## 2015-11-19 NOTE — Telephone Encounter (Signed)
Left message on patients answering machine per his instructions. Per E. Saguier called patient with instructions on BP. Advised to continue current dose of BP medication. Check BP daily and call with readings in 10 days. Advised patient to call back if instructions were not clear.

## 2015-11-19 NOTE — Progress Notes (Signed)
Pre visit review using our clinic review tool, if applicable. No additional management support is needed unless otherwise documented below in the visit note.   Patient in for BP check.  BP. Medications taken as ordered. Per E Saguier,continue same dose of medication. Check BP daily. Call office in 10 days with BP readings. BP  Today = 130/92 Pulse 59

## 2015-11-20 NOTE — Progress Notes (Signed)
RN blood check note reviewed. Agree with documention and plan. 

## 2015-11-23 ENCOUNTER — Other Ambulatory Visit: Payer: Self-pay | Admitting: Family Medicine

## 2015-12-14 ENCOUNTER — Other Ambulatory Visit: Payer: Self-pay | Admitting: Family Medicine

## 2015-12-18 ENCOUNTER — Other Ambulatory Visit: Payer: Self-pay | Admitting: Family Medicine

## 2015-12-22 ENCOUNTER — Telehealth: Payer: Self-pay | Admitting: *Deleted

## 2015-12-22 NOTE — Telephone Encounter (Signed)
Quantity limit exception prior auth request initiated on [email or web address] for a total of 45 tabs in 30 days. Awaiting determination. [id number]

## 2015-12-24 ENCOUNTER — Encounter: Payer: Self-pay | Admitting: Family Medicine

## 2015-12-24 ENCOUNTER — Other Ambulatory Visit: Payer: Self-pay | Admitting: Family Medicine

## 2015-12-24 ENCOUNTER — Ambulatory Visit (INDEPENDENT_AMBULATORY_CARE_PROVIDER_SITE_OTHER): Payer: Medicare Other | Admitting: Family Medicine

## 2015-12-24 VITALS — BP 132/80 | HR 64 | Temp 97.7°F | Ht 72.0 in | Wt 223.4 lb

## 2015-12-24 DIAGNOSIS — I10 Essential (primary) hypertension: Secondary | ICD-10-CM

## 2015-12-24 DIAGNOSIS — E039 Hypothyroidism, unspecified: Secondary | ICD-10-CM | POA: Diagnosis not present

## 2015-12-24 DIAGNOSIS — E118 Type 2 diabetes mellitus with unspecified complications: Secondary | ICD-10-CM

## 2015-12-24 DIAGNOSIS — Z1159 Encounter for screening for other viral diseases: Secondary | ICD-10-CM

## 2015-12-24 DIAGNOSIS — M26629 Arthralgia of temporomandibular joint, unspecified side: Secondary | ICD-10-CM

## 2015-12-24 DIAGNOSIS — E559 Vitamin D deficiency, unspecified: Secondary | ICD-10-CM

## 2015-12-24 DIAGNOSIS — E785 Hyperlipidemia, unspecified: Secondary | ICD-10-CM

## 2015-12-24 LAB — LIPID PANEL
CHOL/HDL RATIO: 5.9 ratio — AB (ref <=5.0)
Cholesterol: 218 mg/dL — ABNORMAL HIGH (ref 125–200)
HDL: 37 mg/dL — ABNORMAL LOW (ref >=40)
Triglycerides: 568 mg/dL — ABNORMAL HIGH (ref <150)

## 2015-12-24 LAB — CBC
HEMATOCRIT: 43.5 % (ref 39.0–52.0)
HEMOGLOBIN: 14.7 g/dL (ref 13.0–17.0)
MCH: 25.7 pg — AB (ref 26.0–34.0)
MCHC: 33.8 g/dL (ref 30.0–36.0)
MCV: 75.9 fL — ABNORMAL LOW (ref 78.0–100.0)
MPV: 9.6 fL (ref 8.6–12.4)
Platelets: 184 10*3/uL (ref 150–400)
RBC: 5.73 MIL/uL (ref 4.22–5.81)
RDW: 16.7 % — ABNORMAL HIGH (ref 11.5–15.5)
WBC: 6.1 10*3/uL (ref 4.0–10.5)

## 2015-12-24 LAB — COMPREHENSIVE METABOLIC PANEL
ALT: 16 U/L (ref 9–46)
AST: 16 U/L (ref 10–35)
Albumin: 4.2 g/dL (ref 3.6–5.1)
Alkaline Phosphatase: 86 U/L (ref 40–115)
BUN: 16 mg/dL (ref 7–25)
CHLORIDE: 102 mmol/L (ref 98–110)
CO2: 27 mmol/L (ref 20–31)
Calcium: 10.2 mg/dL (ref 8.6–10.3)
Creat: 1.45 mg/dL — ABNORMAL HIGH (ref 0.70–1.18)
GLUCOSE: 85 mg/dL (ref 65–99)
POTASSIUM: 4.4 mmol/L (ref 3.5–5.3)
Sodium: 139 mmol/L (ref 135–146)
TOTAL PROTEIN: 7.9 g/dL (ref 6.1–8.1)
Total Bilirubin: 0.4 mg/dL (ref 0.2–1.2)

## 2015-12-24 LAB — TSH: TSH: 3.517 u[IU]/mL (ref 0.350–4.500)

## 2015-12-24 LAB — HEMOGLOBIN A1C
HEMOGLOBIN A1C: 6.6 % — AB (ref <5.7)
MEAN PLASMA GLUCOSE: 143 mg/dL — AB (ref <117)

## 2015-12-24 LAB — HEPATITIS C ANTIBODY: HCV Ab: NEGATIVE

## 2015-12-24 MED ORDER — ATORVASTATIN CALCIUM 80 MG PO TABS: 80.0000 mg | ORAL_TABLET | Freq: Every day | ORAL | Status: DC

## 2015-12-24 MED ORDER — METOPROLOL TARTRATE 25 MG PO TABS: 50.0000 mg | ORAL_TABLET | Freq: Two times a day (BID) | ORAL | Status: DC

## 2015-12-24 MED ORDER — ATORVASTATIN CALCIUM 40 MG PO TABS: 60.0000 mg | ORAL_TABLET | Freq: Every day | ORAL | Status: DC

## 2015-12-24 MED ORDER — METOPROLOL TARTRATE 50 MG PO TABS
50.0000 mg | ORAL_TABLET | Freq: Two times a day (BID) | ORAL | Status: DC
Start: 2015-12-24 — End: 2016-04-14

## 2015-12-24 MED ORDER — AMOXICILLIN 500 MG PO TABS: 500.0000 mg | ORAL_TABLET | Freq: Two times a day (BID) | ORAL | Status: AC

## 2015-12-24 NOTE — Progress Notes (Signed)
Subjective:    Patient ID: Daniel Gates, male    DOB: 07-26-1946, 70 y.o.   MRN: [id number]  Chief Complaint  Patient presents with  . Follow-up    HPI Patient is in today for follow-up. Patient reports some recent increased trouble with stiffness discomfort and popping in his jaw most notably the left TMJ joint. He denies any injury. Denies any redness or warmth. He reports his blood sugars have been fairly well controlled and he denies polyuria or polydipsia. Denies CP/palp/SOB/HA/congestion/fevers/GI or GU c/o. Taking meds as prescribed  Past Medical History  Diagnosis Date  . Glaucoma   . Cancer ([place])     colon; diagnosed 2003  . Depression   . Hypertension   . Allergic rhinitis   . Hyperlipidemia   . Gout   . Diabetes type 2, controlled ([place]) 06/27/2012  . Medicare annual wellness visit, subsequent 02/15/2014  . TMJ arthralgia 01/02/2016    Past Surgical History  Procedure Laterality Date  . Inguinal hernia repair  1997    right  . Colon surgery  2003    for colon cancer  . Colonoscopy      Family History  Problem Relation Age of Onset  . Colon cancer Neg Hx   . Stomach cancer Neg Hx   . Esophageal cancer Neg Hx   . Rectal cancer Neg Hx   . Diabetes Mother   . Heart disease Mother     MI  . Cancer Father     prostate    Social History   Social History  . Marital Status: Widowed    Spouse Name: N/A  . Number of Children: 7  . Years of Education: N/A   Occupational History  .     Social History Main Topics  . Smoking status: Never Smoker   . Smokeless tobacco: Never Used  . Alcohol Use: No  . Drug Use: No  . Sexual Activity: Yes   Other Topics Concern  . Not on file   Social History Narrative    Outpatient Prescriptions Prior to Visit  Medication Sig Dispense Refill  . aspirin 81 MG tablet Take 81 mg by mouth daily.      . COD LIVER OIL PO Take by mouth.      . dicyclomine (BENTYL) 20 MG tablet TAKE 1 TABLET BY MOUTH EVERY 6 HOURS 120  tablet 0  . dutasteride (AVODART) 0.5 MG capsule TAKE ONE CAPSULE BY MOUTH DAILY 30 capsule 1  . dutasteride (AVODART) 0.5 MG capsule TAKE ONE CAPSULE BY MOUTH DAILY 30 capsule 0  . escitalopram (LEXAPRO) 10 MG tablet TAKE 1 TABLET BY MOUTH DAILY 30 tablet 0  . furosemide (LASIX) 40 MG tablet Take 1 tablet (40 mg total) by mouth 2 (two) times daily as needed. Take 1 in the am and 1 in midafternoon for edema and BP as needed 60 tablet 11  . Garlic Oil 3 MG CAPS Take by mouth.      Marland Kitchen glucose blood (ONE TOUCH ULTRA TEST) test strip USE AS DIRECTED TO TEST BLOOD SUGAR TWICE DAILY DX E11.9 100 each 1  . hydrochlorothiazide (HYDRODIURIL) 25 MG tablet Take 1 tablet (25 mg total) by mouth daily. 30 tablet 1  . HYDROcodone-acetaminophen (NORCO) 10-325 MG tablet Take 1 tablet by mouth every 6 (six) hours as needed for moderate pain or severe pain. 120 tablet 0  . indomethacin (INDOCIN SR) 75 MG CR capsule Take 1 capsule (75 mg total) by mouth 3 (  three) times daily as needed. 90 capsule 6  . latanoprost (XALATAN) 0.005 % ophthalmic solution 1 drop at bedtime. Use as directed     . losartan (COZAAR) 100 MG tablet TAKE 1 TABLET BY MOUTH DAILY 90 tablet 0  . metFORMIN (GLUCOPHAGE) 500 MG tablet Take 1 tablet (500 mg total) by mouth 2 (two) times daily with a meal. 180 tablet 3  . Multiple Vitamins-Minerals (ONE-A-DAY MENS HEALTH FORMULA) TABS Take 1 tablet by mouth daily.    Glory Rosebush DELICA LANCETS 99991111 MISC USE TO TEST BLOOD SUGAR TWICE DAILY AS DIRECTED 100 each 6  . promethazine (PHENERGAN) 25 MG tablet TAKE ONE (1) TABLET(S) EVERY FOUR (4) HOURS AS NEEDED FOR NAUSEA 60 tablet 0  . rOPINIRole (REQUIP) 1 MG tablet Take 1-2 tablets (1-2 mg total) by mouth at bedtime. 60 tablet 3  . tamsulosin (FLOMAX) 0.4 MG CAPS capsule TAKE ONE CAPSULE BY MOUTH DAILY AFTER SUPPER 30 capsule 11  . atorvastatin (LIPITOR) 40 MG tablet TAKE 1 AND 1/2 TABLETS BY MOUTH DAILY 45 tablet 0  . metoprolol tartrate (LOPRESSOR) 25 MG  tablet Take 1 tablet (25 mg total) by mouth 3 (three) times daily. 30 tablet 0   No facility-administered medications prior to visit.    No Known Allergies  Review of Systems  Constitutional: Negative for fever and malaise/fatigue.  HENT: Negative for congestion.   Eyes: Negative for discharge.  Respiratory: Negative for shortness of breath.   Cardiovascular: Negative for chest pain, palpitations and leg swelling.  Gastrointestinal: Negative for nausea and abdominal pain.  Genitourinary: Negative for dysuria.  Musculoskeletal: Positive for joint pain. Negative for falls.  Skin: Negative for rash.  Neurological: Negative for loss of consciousness and headaches.  Endo/Heme/Allergies: Negative for environmental allergies.  Psychiatric/Behavioral: Negative for depression. The patient is not nervous/anxious.        Objective:    Physical Exam  Constitutional: He is oriented to person, place, and time. He appears well-developed and well-nourished. No distress.  HENT:  Head: Normocephalic and atraumatic.  Nose: Nose normal.  Eyes: Right eye exhibits no discharge. Left eye exhibits no discharge.  Neck: Normal range of motion. Neck supple.  Cardiovascular: Normal rate and regular rhythm.   No murmur heard. Pulmonary/Chest: Effort normal and breath sounds normal.  Abdominal: Soft. Bowel sounds are normal. There is no tenderness.  Musculoskeletal: He exhibits no edema.  Neurological: He is alert and oriented to person, place, and time.  Skin: Skin is warm and dry.  Psychiatric: He has a normal mood and affect.  Nursing note and vitals reviewed.   BP 132/80 mmHg  Pulse 64  Temp(Src) 97.7 F (36.5 C) (Oral)  Ht 6' (1.829 m)  Wt 223 lb 6 oz (101.322 kg)  BMI 30.29 kg/m2  SpO2 94% Wt Readings from Last 3 Encounters:  12/24/15 223 lb 6 oz (101.322 kg)  10/12/15 220 lb 2 oz (99.848 kg)  11/06/14 225 lb (102.059 kg)     Lab Results  Component Value Date   WBC 6.1 12/24/2015     HGB 14.7 12/24/2015   HCT 43.5 12/24/2015   PLT 184 12/24/2015   GLUCOSE 85 12/24/2015   CHOL 218* 12/24/2015   TRIG 568* 12/24/2015   HDL 37* 12/24/2015   LDLDIRECT 59.3 11/06/2014   LDLCALC NOT CALC 12/24/2015   ALT 16 12/24/2015   AST 16 12/24/2015   NA 139 12/24/2015   K 4.4 12/24/2015   CL 102 12/24/2015   CREATININE 1.45* 12/24/2015  BUN 16 12/24/2015   CO2 27 12/24/2015   TSH 3.517 12/24/2015   PSA 4.54* 10/15/2015   HGBA1C 6.6* 12/24/2015   MICROALBUR 1.7 10/15/2015    Lab Results  Component Value Date   TSH 3.517 12/24/2015   Lab Results  Component Value Date   WBC 6.1 12/24/2015   HGB 14.7 12/24/2015   HCT 43.5 12/24/2015   MCV 75.9* 12/24/2015   PLT 184 12/24/2015   Lab Results  Component Value Date   NA 139 12/24/2015   K 4.4 12/24/2015   CO2 27 12/24/2015   GLUCOSE 85 12/24/2015   BUN 16 12/24/2015   CREATININE 1.45* 12/24/2015   BILITOT 0.4 12/24/2015   ALKPHOS 86 12/24/2015   AST 16 12/24/2015   ALT 16 12/24/2015   PROT 7.9 12/24/2015   ALBUMIN 4.2 12/24/2015   CALCIUM 10.2 12/24/2015   GFR 60.94 11/06/2014   Lab Results  Component Value Date   CHOL 218* 12/24/2015   Lab Results  Component Value Date   HDL 37* 12/24/2015   Lab Results  Component Value Date   LDLCALC NOT CALC 12/24/2015   Lab Results  Component Value Date   TRIG 568* 12/24/2015   Lab Results  Component Value Date   CHOLHDL 5.9* 12/24/2015   Lab Results  Component Value Date   HGBA1C 6.6* 12/24/2015       Assessment & Plan:   Problem List Items Addressed This Visit    Diabetes type 2, controlled ([place])    hgba1c acceptable, minimize simple carbs. Increase exercise as tolerated. Continue current meds      Relevant Orders   Hemoglobin A1c (Completed)   Essential hypertension    Well controlled, no changes to meds. Encouraged heart healthy diet such as the DASH diet and exercise as tolerated.       Relevant Medications   metoprolol (LOPRESSOR) 50  MG tablet   Other Relevant Orders   CBC (Completed)   Comprehensive metabolic panel (Completed)   Lipid panel (Completed)   Hyperlipidemia - Primary    Tolerating statin, encouraged heart healthy diet, avoid trans fats, minimize simple carbs and saturated fats. Increase exercise as tolerated      Relevant Medications   metoprolol (LOPRESSOR) 50 MG tablet   Other Relevant Orders   Lipid panel (Completed)   Hypothyroidism   Relevant Medications   metoprolol (LOPRESSOR) 50 MG tablet   Other Relevant Orders   TSH (Completed)   TMJ arthralgia    Encouraged topical antiinflammatories and consider consultation with dentist if symptoms worsen      Vitamin D deficiency    Encouraged daily supplement at 2000 IU daily       Other Visit Diagnoses    Need for hepatitis C screening test        Relevant Orders    Hepatitis C antibody (Completed)       I have discontinued Mr. [place] metoprolol tartrate, atorvastatin, atorvastatin, and metoprolol tartrate. I am also having him start on amoxicillin and metoprolol. Additionally, I am having him maintain his aspirin, Garlic Oil, COD LIVER OIL PO, latanoprost, promethazine, indomethacin, ONE-A-DAY MENS HEALTH FORMULA, furosemide, losartan, dutasteride, tamsulosin, metFORMIN, HYDROcodone-acetaminophen, rOPINIRole, glucose blood, hydrochlorothiazide, ONETOUCH DELICA LANCETS 99991111, dutasteride, dicyclomine, and escitalopram.  Meds ordered this encounter  Medications  . DISCONTD: atorvastatin (LIPITOR) 40 MG tablet    Sig: Take 1.5 tablets (60 mg total) by mouth daily.    Dispense:  45 tablet    Refill:  0  .  DISCONTD: atorvastatin (LIPITOR) 40 MG tablet    Sig: Take 1.5 tablets (60 mg total) by mouth daily.    Dispense:  135 tablet    Refill:  1  . amoxicillin (AMOXIL) 500 MG tablet    Sig: Take 1 tablet (500 mg total) by mouth 2 (two) times daily.    Dispense:  20 tablet    Refill:  0  . DISCONTD: metoprolol tartrate (LOPRESSOR) 25 MG  tablet    Sig: Take 2 tablets (50 mg total) by mouth 2 (two) times daily.    Dispense:  30 tablet    Refill:  3  . metoprolol (LOPRESSOR) 50 MG tablet    Sig: Take 1 tablet (50 mg total) by mouth 2 (two) times daily.    Dispense:  60 tablet    Refill:  [place], MD

## 2015-12-24 NOTE — Patient Instructions (Addendum)
Try over the counter Vitamin D 2000iu daily. Digestive Advantage and Hardin Negus Colon health probiotics              Cholesterol  Cholesterol is a white, waxy, fat-like substance needed by your body in small amounts. The liver makes all the cholesterol you need. Cholesterol is carried from the liver by the blood through the blood vessels. Deposits of cholesterol (plaque) may build up on blood vessel walls. These make the arteries narrower and stiffer. Cholesterol plaques increase the risk for heart attack and stroke.  You cannot feel your cholesterol level even if it is very high. The only way to know it is high is with a blood test. Once you know your cholesterol levels, you should keep a record of the test results. Work with your health care provider to keep your levels in the desired range.  WHAT DO THE RESULTS MEAN?  Total cholesterol is a rough measure of all the cholesterol in your blood.   LDL is the so-called bad cholesterol. This is the type that deposits cholesterol in the walls of the arteries. You want this level to be low.   HDL is the good cholesterol because it cleans the arteries and carries the LDL away. You want this level to be high.  Triglycerides are fat that the body can either burn for energy or store. High levels are closely linked to heart disease.  WHAT ARE THE DESIRED LEVELS OF CHOLESTEROL?  Total cholesterol below 200.   LDL below 100 for people at risk, below 70 for those at very high risk.   HDL above 50 is good, above 60 is best.   Triglycerides below 150.  HOW CAN I LOWER MY CHOLESTEROL?  Diet. Follow your diet programs as directed by your health care provider.   Choose fish or white meat chicken and [place], roasted or baked. Limit fatty cuts of red meat, fried foods, and processed meats, such as sausage and lunch meats.   Eat lots of fresh fruits and vegetables.  Choose whole grains, beans, pasta, potatoes, and cereals.   Use only small  amounts of olive, corn, or canola oils.   Avoid butter, mayonnaise, shortening, or palm kernel oils.  Avoid foods with trans fats.   Drink skim or nonfat milk and eat low-fat or nonfat yogurt and cheeses. Avoid whole milk, cream, ice cream, egg yolks, and full-fat cheeses.   Healthy desserts include angel food cake, ginger snaps, animal crackers, hard candy, popsicles, and low-fat or nonfat frozen yogurt. Avoid pastries, cakes, pies, and cookies.   Exercise. Follow your exercise programs as directed by your health care provider.   A regular program helps decrease LDL and raise HDL.   A regular program helps with weight control.   Do things that increase your activity level like gardening, walking, or taking the stairs. Ask your health care provider about how you can be more active in your daily life.   Medicine. Take medicine only as directed by your health care provider.   Medicine may be prescribed by your health care provider to help lower cholesterol and decrease the risk for heart disease.   If you have several risk factors, you may need medicine even if your levels are normal.   This information is not intended to replace advice given to you by your health care provider. Make sure you discuss any questions you have with your health care provider.   Document Released: 08/15/2001 Document Revised: 12/11/2014 Document Reviewed: 09/03/2013 Elsevier  Interactive Patient Education 2016 Elsevier Inc.  

## 2015-12-24 NOTE — Progress Notes (Signed)
Pre visit review using our clinic review tool, if applicable. No additional management support is needed unless otherwise documented below in the visit note. 

## 2015-12-24 NOTE — Telephone Encounter (Signed)
PA approved for 1.5 tabs per day through 12/03/2016. Approval letter sent for scanning. JG//CMA

## 2016-01-02 ENCOUNTER — Encounter: Payer: Self-pay | Admitting: Family Medicine

## 2016-01-02 DIAGNOSIS — M26629 Arthralgia of temporomandibular joint, unspecified side: Secondary | ICD-10-CM | POA: Insufficient documentation

## 2016-01-02 HISTORY — DX: Arthralgia of temporomandibular joint, unspecified side: M26.629

## 2016-01-02 NOTE — Assessment & Plan Note (Signed)
hgba1c acceptable, minimize simple carbs. Increase exercise as tolerated. Continue current meds 

## 2016-01-02 NOTE — Assessment & Plan Note (Signed)
Well controlled, no changes to meds. Encouraged heart healthy diet such as the DASH diet and exercise as tolerated.  °

## 2016-01-02 NOTE — Assessment & Plan Note (Signed)
escitalopram daily

## 2016-01-02 NOTE — Assessment & Plan Note (Signed)
Encouraged topical antiinflammatories and consider consultation with dentist if symptoms worsen

## 2016-01-02 NOTE — Assessment & Plan Note (Signed)
Tolerating statin, encouraged heart healthy diet, avoid trans fats, minimize simple carbs and saturated fats. Increase exercise as tolerated 

## 2016-01-02 NOTE — Assessment & Plan Note (Signed)
Encouraged daily supplement at 2000 IU daily

## 2016-01-15 ENCOUNTER — Other Ambulatory Visit: Payer: Self-pay | Admitting: Family Medicine

## 2016-01-18 ENCOUNTER — Telehealth: Payer: Self-pay | Admitting: *Deleted

## 2016-01-18 ENCOUNTER — Other Ambulatory Visit: Payer: Self-pay | Admitting: Family Medicine

## 2016-01-18 NOTE — Telephone Encounter (Signed)
Received fax request for PA on Dutasteride; Insurance Preferred Finasteride/SLS 02/14 [phone], ID [id number] Please Advise.

## 2016-01-18 NOTE — Telephone Encounter (Signed)
Please check with patient and let him know insurance is refusing to pay for his Avodart and see if he is willing to try Finasteride

## 2016-01-20 ENCOUNTER — Other Ambulatory Visit: Payer: Self-pay | Admitting: Family Medicine

## 2016-01-20 MED ORDER — DICYCLOMINE HCL 20 MG PO TABS: 20.0000 mg | ORAL_TABLET | Freq: Four times a day (QID) | ORAL | Status: DC

## 2016-01-20 NOTE — Telephone Encounter (Signed)
Called the patient informed of denial.  He said he is not taking avodart, but only taking Tamsulosin  He thought that had replaced avodart??

## 2016-01-20 NOTE — Telephone Encounter (Signed)
So all of the meds work on the prostate but indifferent ways. You can use 2 together if need be but one is fine if patient is asymptomatic. So if his symptoms are good, Tamulosin alone is fine, if not can add Finasteride

## 2016-01-21 ENCOUNTER — Other Ambulatory Visit: Payer: Self-pay | Admitting: Family Medicine

## 2016-01-21 NOTE — Telephone Encounter (Signed)
Patient informed of PCP instructions regarding medication.  He is doing ok on tamsulosin and having no other symptoms at this time.

## 2016-01-21 NOTE — Telephone Encounter (Signed)
Called left message to call back 

## 2016-02-14 ENCOUNTER — Other Ambulatory Visit: Payer: Self-pay | Admitting: Family Medicine

## 2016-02-20 ENCOUNTER — Other Ambulatory Visit: Payer: Self-pay | Admitting: Family Medicine

## 2016-03-23 ENCOUNTER — Other Ambulatory Visit: Payer: Self-pay | Admitting: Family Medicine

## 2016-03-24 ENCOUNTER — Encounter: Payer: Self-pay | Admitting: Family Medicine

## 2016-03-24 ENCOUNTER — Ambulatory Visit (INDEPENDENT_AMBULATORY_CARE_PROVIDER_SITE_OTHER): Payer: Medicare Other | Admitting: Family Medicine

## 2016-03-24 VITALS — BP 132/86 | HR 65 | Temp 98.4°F | Ht 72.0 in | Wt 218.5 lb

## 2016-03-24 DIAGNOSIS — M1000 Idiopathic gout, unspecified site: Secondary | ICD-10-CM

## 2016-03-24 DIAGNOSIS — E785 Hyperlipidemia, unspecified: Secondary | ICD-10-CM

## 2016-03-24 DIAGNOSIS — E1122 Type 2 diabetes mellitus with diabetic chronic kidney disease: Secondary | ICD-10-CM | POA: Diagnosis not present

## 2016-03-24 DIAGNOSIS — D649 Anemia, unspecified: Secondary | ICD-10-CM

## 2016-03-24 DIAGNOSIS — E038 Other specified hypothyroidism: Secondary | ICD-10-CM | POA: Diagnosis not present

## 2016-03-24 DIAGNOSIS — I10 Essential (primary) hypertension: Secondary | ICD-10-CM

## 2016-03-24 DIAGNOSIS — E559 Vitamin D deficiency, unspecified: Secondary | ICD-10-CM

## 2016-03-24 LAB — COMPREHENSIVE METABOLIC PANEL
ALT: 19 U/L (ref 0–53)
AST: 19 U/L (ref 0–37)
Albumin: 4 g/dL (ref 3.5–5.2)
Alkaline Phosphatase: 95 U/L (ref 39–117)
BILIRUBIN TOTAL: 0.3 mg/dL (ref 0.2–1.2)
BUN: 19 mg/dL (ref 6–23)
CALCIUM: 9.8 mg/dL (ref 8.4–10.5)
CHLORIDE: 102 meq/L (ref 96–112)
CO2: 28 meq/L (ref 19–32)
Creatinine, Ser: 1.31 mg/dL (ref 0.40–1.50)
GFR: 69.33 mL/min (ref >60.00)
Glucose, Bld: 99 mg/dL (ref 70–99)
POTASSIUM: 4 meq/L (ref 3.5–5.1)
Sodium: 139 mEq/L (ref 135–145)
Total Protein: 7.8 g/dL (ref 6.0–8.3)

## 2016-03-24 LAB — TSH: TSH: 2.75 u[IU]/mL (ref 0.35–4.50)

## 2016-03-24 LAB — CBC
HEMATOCRIT: 39.4 % (ref 39.0–52.0)
HEMOGLOBIN: 13.1 g/dL (ref 13.0–17.0)
MCHC: 33.4 g/dL (ref 30.0–36.0)
MCV: 77.7 fl — AB (ref 78.0–100.0)
Platelets: 212 10*3/uL (ref 150.0–400.0)
RBC: 5.07 Mil/uL (ref 4.22–5.81)
RDW: 17.3 % — AB (ref 11.5–15.5)
WBC: 6.1 10*3/uL (ref 4.0–10.5)

## 2016-03-24 LAB — LDL CHOLESTEROL, DIRECT: LDL DIRECT: 49 mg/dL

## 2016-03-24 LAB — LIPID PANEL
CHOL/HDL RATIO: 5
Cholesterol: 136 mg/dL (ref 0–200)
HDL: 28.9 mg/dL — ABNORMAL LOW (ref >39.00)
NONHDL: 107.55
Triglycerides: 389 mg/dL — ABNORMAL HIGH (ref 0.0–149.0)
VLDL: 77.8 mg/dL — ABNORMAL HIGH (ref 0.0–40.0)

## 2016-03-24 LAB — VITAMIN D 25 HYDROXY (VIT D DEFICIENCY, FRACTURES): VITD: 44.98 ng/mL (ref 30.00–100.00)

## 2016-03-24 LAB — URIC ACID: URIC ACID, SERUM: 8.7 mg/dL — AB (ref 4.0–7.8)

## 2016-03-24 LAB — HEMOGLOBIN A1C: Hgb A1c MFr Bld: 6.1 % (ref 4.6–6.5)

## 2016-03-24 NOTE — Patient Instructions (Signed)
3 mn AWV with rn and fu with MD then 6 mn CPE  Cholesterol Cholesterol is a white, waxy, fat-like substance needed by your body in small amounts. The liver makes all the cholesterol you need. Cholesterol is carried from the liver by the blood through the blood vessels. Deposits of cholesterol (plaque) may build up on blood vessel walls. These make the arteries narrower and stiffer. Cholesterol plaques increase the risk for heart attack and stroke.  You cannot feel your cholesterol level even if it is very high. The only way to know it is high is with a blood test. Once you know your cholesterol levels, you should keep a record of the test results. Work with your health care provider to keep your levels in the desired range.  WHAT DO THE RESULTS MEAN?  Total cholesterol is a rough measure of all the cholesterol in your blood.   LDL is the so-called bad cholesterol. This is the type that deposits cholesterol in the walls of the arteries. You want this level to be low.   HDL is the good cholesterol because it cleans the arteries and carries the LDL away. You want this level to be high.  Triglycerides are fat that the body can either burn for energy or store. High levels are closely linked to heart disease.  WHAT ARE THE DESIRED LEVELS OF CHOLESTEROL?  Total cholesterol below 200.   LDL below 100 for people at risk, below 70 for those at very high risk.   HDL above 50 is good, above 60 is best.   Triglycerides below 150.  HOW CAN I LOWER MY CHOLESTEROL?  Diet. Follow your diet programs as directed by your health care provider.   Choose fish or white meat chicken and [place], roasted or baked. Limit fatty cuts of red meat, fried foods, and processed meats, such as sausage and lunch meats.   Eat lots of fresh fruits and vegetables.  Choose whole grains, beans, pasta, potatoes, and cereals.   Use only small amounts of olive, corn, or canola oils.   Avoid butter, mayonnaise,  shortening, or palm kernel oils.  Avoid foods with trans fats.   Drink skim or nonfat milk and eat low-fat or nonfat yogurt and cheeses. Avoid whole milk, cream, ice cream, egg yolks, and full-fat cheeses.   Healthy desserts include angel food cake, ginger snaps, animal crackers, hard candy, popsicles, and low-fat or nonfat frozen yogurt. Avoid pastries, cakes, pies, and cookies.   Exercise. Follow your exercise programs as directed by your health care provider.   A regular program helps decrease LDL and raise HDL.   A regular program helps with weight control.   Do things that increase your activity level like gardening, walking, or taking the stairs. Ask your health care provider about how you can be more active in your daily life.   Medicine. Take medicine only as directed by your health care provider.   Medicine may be prescribed by your health care provider to help lower cholesterol and decrease the risk for heart disease.   If you have several risk factors, you may need medicine even if your levels are normal.   This information is not intended to replace advice given to you by your health care provider. Make sure you discuss any questions you have with your health care provider.   Document Released: 08/15/2001 Document Revised: 12/11/2014 Document Reviewed: 09/03/2013 Elsevier Interactive Patient Education [employer].

## 2016-03-24 NOTE — Progress Notes (Signed)
Pre visit review using our clinic review tool, if applicable. No additional management support is needed unless otherwise documented below in the visit note. 

## 2016-03-27 ENCOUNTER — Other Ambulatory Visit: Payer: Self-pay | Admitting: Family Medicine

## 2016-03-27 MED ORDER — FENOFIBRATE MICRONIZED 130 MG PO CAPS: 130.0000 mg | ORAL_CAPSULE | Freq: Every day | ORAL | Status: DC

## 2016-04-02 NOTE — Assessment & Plan Note (Signed)
Tolerating statin, encouraged heart healthy diet, avoid trans fats, minimize simple carbs and saturated fats. Increase exercise as tolerated 

## 2016-04-02 NOTE — Assessment & Plan Note (Signed)
Well treated, continue supplements. Calcium slightly low, encouraged increased supplementation

## 2016-04-02 NOTE — Assessment & Plan Note (Signed)
hgba1c acceptable, minimize simple carbs. Increase exercise as tolerated. Continue current meds 

## 2016-04-02 NOTE — Progress Notes (Signed)
Patient ID: Daniel Gates, male   DOB: Apr 12, 1946, 70 y.o.   MRN: [id number]   Subjective:    Patient ID: Daniel Gates, male    DOB: December 28, 1945, 70 y.o.   MRN: [id number]  Chief Complaint  Patient presents with  . Follow-up    HPI Patient is in today for follow up. He is feeling well today. Denies any polyuria or polydipsia. No recent illness or acute concerns. Blood sugar last night was 108 and this am was 123. Numbers are ranging from 80 to 150. Denies CP/palp/SOB/HA/congestion/fevers/GI or GU c/o. Taking meds as prescribed Past Medical History  Diagnosis Date  . Glaucoma   . Cancer ([place])     colon; diagnosed 2003  . Depression   . Hypertension   . Allergic rhinitis   . Hyperlipidemia   . Gout   . Diabetes type 2, controlled ([place]) 06/27/2012  . Medicare annual wellness visit, subsequent 02/15/2014  . TMJ arthralgia 01/02/2016    Past Surgical History  Procedure Laterality Date  . Inguinal hernia repair  1997    right  . Colon surgery  2003    for colon cancer  . Colonoscopy      Family History  Problem Relation Age of Onset  . Colon cancer Neg Hx   . Stomach cancer Neg Hx   . Esophageal cancer Neg Hx   . Rectal cancer Neg Hx   . Diabetes Mother   . Heart disease Mother     MI  . Cancer Father     prostate    Social History   Social History  . Marital Status: Widowed    Spouse Name: N/A  . Number of Children: 7  . Years of Education: N/A   Occupational History  .     Social History Main Topics  . Smoking status: Never Smoker   . Smokeless tobacco: Never Used  . Alcohol Use: No  . Drug Use: No  . Sexual Activity: Yes   Other Topics Concern  . Not on file   Social History Narrative    Outpatient Prescriptions Prior to Visit  Medication Sig Dispense Refill  . aspirin 81 MG tablet Take 81 mg by mouth daily.      Marland Kitchen atorvastatin (LIPITOR) 40 MG tablet TAKE 1 AND 1/2 TABLETS(60 MG) BY MOUTH DAILY 45 tablet 6  . atorvastatin (LIPITOR) 80 MG tablet  Take 1 tablet (80 mg total) by mouth daily. 90 tablet 1  . COD LIVER OIL PO Take by mouth.      . dicyclomine (BENTYL) 20 MG tablet Take 1 tablet (20 mg total) by mouth every 6 (six) hours. 120 tablet 0  . dutasteride (AVODART) 0.5 MG capsule TAKE ONE CAPSULE BY MOUTH DAILY 30 capsule 1  . dutasteride (AVODART) 0.5 MG capsule TAKE ONE CAPSULE BY MOUTH DAILY 30 capsule 0  . furosemide (LASIX) 40 MG tablet Take 1 tablet (40 mg total) by mouth 2 (two) times daily as needed. Take 1 in the am and 1 in midafternoon for edema and BP as needed 60 tablet 11  . Garlic Oil 3 MG CAPS Take by mouth.      . hydrochlorothiazide (HYDRODIURIL) 25 MG tablet TAKE 1 TABLET(25 MG) BY MOUTH DAILY 30 tablet 6  . HYDROcodone-acetaminophen (NORCO) 10-325 MG tablet Take 1 tablet by mouth every 6 (six) hours as needed for moderate pain or severe pain. 120 tablet 0  . indomethacin (INDOCIN SR) 75 MG CR capsule Take 1 capsule (  75 mg total) by mouth 3 (three) times daily as needed. 90 capsule 6  . latanoprost (XALATAN) 0.005 % ophthalmic solution 1 drop at bedtime. Use as directed     . losartan (COZAAR) 100 MG tablet TAKE 1 TABLET BY MOUTH DAILY 90 tablet 0  . metFORMIN (GLUCOPHAGE) 500 MG tablet Take 1 tablet (500 mg total) by mouth 2 (two) times daily with a meal. 180 tablet 3  . metoprolol (LOPRESSOR) 50 MG tablet Take 1 tablet (50 mg total) by mouth 2 (two) times daily. 60 tablet 3  . Multiple Vitamins-Minerals (ONE-A-DAY MENS HEALTH FORMULA) TABS Take 1 tablet by mouth daily.    . ONE TOUCH ULTRA TEST test strip USE TO TEST BLOOD SUGAR TWICE DAILY AS DIRECTED 30 each 5  . ONETOUCH DELICA LANCETS 99991111 MISC USE TO TEST BLOOD SUGAR TWICE DAILY AS DIRECTED 100 each 6  . promethazine (PHENERGAN) 25 MG tablet TAKE ONE (1) TABLET(S) EVERY FOUR (4) HOURS AS NEEDED FOR NAUSEA 60 tablet 0  . rOPINIRole (REQUIP) 1 MG tablet TAKE 1 TO 2 TABLETS(1 TO 2 MG) BY MOUTH AT BEDTIME 60 tablet 0  . tamsulosin (FLOMAX) 0.4 MG CAPS capsule TAKE  ONE CAPSULE BY MOUTH DAILY AFTER SUPPER 30 capsule 11  . escitalopram (LEXAPRO) 10 MG tablet TAKE 1 TABLET BY MOUTH DAILY 30 tablet 0   No facility-administered medications prior to visit.    No Known Allergies  Review of Systems  Constitutional: Negative for fever and malaise/fatigue.  HENT: Negative for congestion.   Eyes: Negative for blurred vision.  Respiratory: Negative for shortness of breath.   Cardiovascular: Negative for chest pain, palpitations and leg swelling.  Gastrointestinal: Negative for nausea, abdominal pain and blood in stool.  Genitourinary: Negative for dysuria and frequency.  Musculoskeletal: Negative for falls.  Skin: Negative for rash.  Neurological: Negative for dizziness, loss of consciousness and headaches.  Endo/Heme/Allergies: Negative for environmental allergies.  Psychiatric/Behavioral: Negative for depression. The patient is not nervous/anxious.        Objective:    Physical Exam  Constitutional: He is oriented to person, place, and time. He appears well-developed and well-nourished. No distress.  HENT:  Head: Normocephalic and atraumatic.  Nose: Nose normal.  Eyes: Right eye exhibits no discharge. Left eye exhibits no discharge.  Neck: Normal range of motion. Neck supple.  Cardiovascular: Normal rate and regular rhythm.   No murmur heard. Pulmonary/Chest: Effort normal and breath sounds normal.  Abdominal: Soft. Bowel sounds are normal. There is no tenderness.  Musculoskeletal: He exhibits no edema.  Neurological: He is alert and oriented to person, place, and time.  Skin: Skin is warm and dry.  Psychiatric: He has a normal mood and affect.  Nursing note and vitals reviewed.   BP 132/86 mmHg  Pulse 65  Temp(Src) 98.4 F (36.9 C) (Oral)  Ht 6' (1.829 m)  Wt 218 lb 8 oz (99.111 kg)  BMI 29.63 kg/m2  SpO2 93% Wt Readings from Last 3 Encounters:  03/24/16 218 lb 8 oz (99.111 kg)  12/24/15 223 lb 6 oz (101.322 kg)  10/12/15 220 lb 2  oz (99.848 kg)     Lab Results  Component Value Date   WBC 6.1 03/24/2016   HGB 13.1 03/24/2016   HCT 39.4 03/24/2016   PLT 212.0 03/24/2016   GLUCOSE 99 03/24/2016   CHOL 136 03/24/2016   TRIG 389.0* 03/24/2016   HDL 28.90* 03/24/2016   LDLDIRECT 49.0 03/24/2016   LDLCALC NOT CALC 12/24/2015  ALT 19 03/24/2016   AST 19 03/24/2016   NA 139 03/24/2016   K 4.0 03/24/2016   CL 102 03/24/2016   CREATININE 1.31 03/24/2016   BUN 19 03/24/2016   CO2 28 03/24/2016   TSH 2.75 03/24/2016   PSA 4.54* 10/15/2015   HGBA1C 6.1 03/24/2016   MICROALBUR 1.7 10/15/2015    Lab Results  Component Value Date   TSH 2.75 03/24/2016   Lab Results  Component Value Date   WBC 6.1 03/24/2016   HGB 13.1 03/24/2016   HCT 39.4 03/24/2016   MCV 77.7* 03/24/2016   PLT 212.0 03/24/2016   Lab Results  Component Value Date   NA 139 03/24/2016   K 4.0 03/24/2016   CO2 28 03/24/2016   GLUCOSE 99 03/24/2016   BUN 19 03/24/2016   CREATININE 1.31 03/24/2016   BILITOT 0.3 03/24/2016   ALKPHOS 95 03/24/2016   AST 19 03/24/2016   ALT 19 03/24/2016   PROT 7.8 03/24/2016   ALBUMIN 4.0 03/24/2016   CALCIUM 9.8 03/24/2016   GFR 69.33 03/24/2016   Lab Results  Component Value Date   CHOL 136 03/24/2016   Lab Results  Component Value Date   HDL 28.90* 03/24/2016   Lab Results  Component Value Date   LDLCALC NOT CALC 12/24/2015   Lab Results  Component Value Date   TRIG 389.0* 03/24/2016   Lab Results  Component Value Date   CHOLHDL 5 03/24/2016   Lab Results  Component Value Date   HGBA1C 6.1 03/24/2016       Assessment & Plan:   Problem List Items Addressed This Visit    ANEMIA   Relevant Orders   Hemoglobin A1c (Completed)   Lipid panel (Completed)   CBC (Completed)   TSH (Completed)   Comprehensive metabolic panel (Completed)   Vitamin D (25 hydroxy) (Completed)   Uric acid (Completed)   Diabetes type 2, controlled ([facility])    hgba1c acceptable, minimize simple  carbs. Increase exercise as tolerated. Continue current meds      Relevant Orders   Hemoglobin A1c (Completed)   Lipid panel (Completed)   CBC (Completed)   TSH (Completed)   Comprehensive metabolic panel (Completed)   Vitamin D (25 hydroxy) (Completed)   Uric acid (Completed)   Essential hypertension    Well controlled, no changes to meds. Encouraged heart healthy diet such as the DASH diet and exercise as tolerated.       Relevant Orders   Hemoglobin A1c (Completed)   Lipid panel (Completed)   CBC (Completed)   TSH (Completed)   Comprehensive metabolic panel (Completed)   Vitamin D (25 hydroxy) (Completed)   Uric acid (Completed)   Hyperlipidemia    Tolerating statin, encouraged heart healthy diet, avoid trans fats, minimize simple carbs and saturated fats. Increase exercise as tolerated      Relevant Orders   Hemoglobin A1c (Completed)   Lipid panel (Completed)   CBC (Completed)   TSH (Completed)   Comprehensive metabolic panel (Completed)   Vitamin D (25 hydroxy) (Completed)   Uric acid (Completed)   Hypothyroidism   Relevant Orders   Hemoglobin A1c (Completed)   Lipid panel (Completed)   CBC (Completed)   TSH (Completed)   Comprehensive metabolic panel (Completed)   Vitamin D (25 hydroxy) (Completed)   Uric acid (Completed)   Vitamin D deficiency - Primary    Well treated, continue supplements. Calcium slightly low, encouraged increased supplementation      Relevant Orders   Hemoglobin  A1c (Completed)   Lipid panel (Completed)   CBC (Completed)   TSH (Completed)   Comprehensive metabolic panel (Completed)   Vitamin D (25 hydroxy) (Completed)   Uric acid (Completed)    Other Visit Diagnoses    Idiopathic gout, unspecified chronicity, unspecified site        Relevant Orders    Hemoglobin A1c (Completed)    Lipid panel (Completed)    CBC (Completed)    TSH (Completed)    Comprehensive metabolic panel (Completed)    Vitamin D (25 hydroxy) (Completed)     Uric acid (Completed)       I am having Daniel Gates maintain his aspirin, Garlic Oil, COD LIVER OIL PO, latanoprost, promethazine, indomethacin, ONE-A-DAY MENS HEALTH FORMULA, furosemide, losartan, dutasteride, tamsulosin, metFORMIN, HYDROcodone-acetaminophen, ONETOUCH DELICA LANCETS 99991111, dutasteride, metoprolol, atorvastatin, ONE TOUCH ULTRA TEST, dicyclomine, atorvastatin, hydrochlorothiazide, and rOPINIRole.  No orders of the defined types were placed in this encounter.     Penni Homans, MD

## 2016-04-02 NOTE — Assessment & Plan Note (Addendum)
Well controlled, no changes to meds. Encouraged heart healthy diet such as the DASH diet and exercise as tolerated.  °

## 2016-04-14 ENCOUNTER — Other Ambulatory Visit: Payer: Self-pay | Admitting: Family Medicine

## 2016-04-22 ENCOUNTER — Other Ambulatory Visit: Payer: Self-pay | Admitting: Family Medicine

## 2016-05-18 LAB — HM DIABETES FOOT EXAM

## 2016-06-23 ENCOUNTER — Other Ambulatory Visit: Payer: Self-pay | Admitting: Family Medicine

## 2016-06-23 ENCOUNTER — Ambulatory Visit (INDEPENDENT_AMBULATORY_CARE_PROVIDER_SITE_OTHER): Payer: Medicare Other

## 2016-06-23 VITALS — BP 118/72 | HR 54 | Resp 16 | Ht 71.5 in | Wt 209.4 lb

## 2016-06-23 DIAGNOSIS — E119 Type 2 diabetes mellitus without complications: Secondary | ICD-10-CM | POA: Diagnosis not present

## 2016-06-23 DIAGNOSIS — R972 Elevated prostate specific antigen [PSA]: Secondary | ICD-10-CM

## 2016-06-23 DIAGNOSIS — Z0000 Encounter for general adult medical examination without abnormal findings: Secondary | ICD-10-CM | POA: Diagnosis not present

## 2016-06-23 DIAGNOSIS — E785 Hyperlipidemia, unspecified: Secondary | ICD-10-CM | POA: Diagnosis not present

## 2016-06-23 LAB — COMPREHENSIVE METABOLIC PANEL
ALBUMIN: 4.1 g/dL (ref 3.5–5.2)
ALT: 23 U/L (ref 0–53)
AST: 22 U/L (ref 0–37)
Alkaline Phosphatase: 40 U/L (ref 39–117)
BUN: 24 mg/dL — ABNORMAL HIGH (ref 6–23)
CALCIUM: 10.1 mg/dL (ref 8.4–10.5)
CHLORIDE: 100 meq/L (ref 96–112)
CO2: 29 meq/L (ref 19–32)
CREATININE: 1.96 mg/dL — AB (ref 0.40–1.50)
GFR: 43.52 mL/min — AB (ref >60.00)
Glucose, Bld: 82 mg/dL (ref 70–99)
POTASSIUM: 4.2 meq/L (ref 3.5–5.1)
Sodium: 135 mEq/L (ref 135–145)
Total Bilirubin: 0.4 mg/dL (ref 0.2–1.2)
Total Protein: 7.5 g/dL (ref 6.0–8.3)

## 2016-06-23 LAB — LIPID PANEL
CHOL/HDL RATIO: 5
Cholesterol: 174 mg/dL (ref 0–200)
HDL: 35.4 mg/dL — AB (ref >39.00)
NONHDL: 138.49
TRIGLYCERIDES: 235 mg/dL — AB (ref 0.0–149.0)
VLDL: 47 mg/dL — AB (ref 0.0–40.0)

## 2016-06-23 LAB — PSA: PSA: 6.69 ng/mL — AB (ref 0.10–4.00)

## 2016-06-23 LAB — LDL CHOLESTEROL, DIRECT: LDL DIRECT: 97 mg/dL

## 2016-06-23 LAB — HEMOGLOBIN A1C: Hgb A1c MFr Bld: 6.1 % (ref 4.6–6.5)

## 2016-06-23 MED ORDER — ATORVASTATIN CALCIUM 20 MG PO TABS: 20.0000 mg | ORAL_TABLET | Freq: Every day | ORAL | Status: DC

## 2016-06-23 NOTE — Assessment & Plan Note (Signed)
Well controlled, repeat A1c today per Dr. Charlett Blake. Dietary counseling discussed w/ pt including food choices, portion sizes, and plate method. He will consider switching to Diet Coke or Coke Zero and drinking flavored water instead of Kool-Aid. S/S of hypoglycemia discussed w/ pt and written information provided.

## 2016-06-23 NOTE — Assessment & Plan Note (Signed)
Pt unfortunately stopped taking atorvastatin 80 mg when he started fenofibrate. Lipid panel today, medication adjustments per PCP. Follow-up appointment scheduled w/ PCP.

## 2016-06-23 NOTE — Progress Notes (Signed)
Subjective:   Daniel Gates is a 70 y.o. male who presents for Medicare Annual/Subsequent preventive examination.  Review of Systems:  No ROS.  Medicare Wellness Visit.   Cardiac Risk Factors include: advanced age (>52men, >26 women);diabetes mellitus;dyslipidemia;hypertension;male gender;obesity (BMI >30kg/m2)   Sleep patterns: Gets up 2-3x nightly to void, reports does not sleep well at night. 6 hours nightly.   Home Safety/Smoke Alarms: Lives alone, feel safe in home. No issues climbing stairs or getting in and out of shower.  Seat Belt Safety/Bike Helmet: Wears seat belt.   Counseling:   Eye Exam-Dr. Sarita Haver every 3-4 months, has appt next month  Dental-Does not see dentist regularly, reports no dental pain, loss of teeth, or other dental concerns.  Male:  CCS-05/28/12    PSA-  4.54 (10/15/15)    Objective:    Vitals: BP 118/72 mmHg  Pulse 54  Resp 16  Ht 5' 11.5" (1.816 m)  Wt 209 lb 6.4 oz (94.983 kg)  BMI 28.80 kg/m2  SpO2 98%  Body mass index is 28.8 kg/(m^2).  Tobacco History  Smoking status  . Never Smoker   Smokeless tobacco  . Never Used     Counseling given: Not Answered   Past Medical History  Diagnosis Date  . Glaucoma   . Cancer ([place])     colon; diagnosed 2003  . Depression   . Hypertension   . Allergic rhinitis   . Hyperlipidemia   . Gout   . Diabetes type 2, controlled ([place]) 06/27/2012  . Medicare annual wellness visit, subsequent 02/15/2014  . TMJ arthralgia 01/02/2016   Past Surgical History  Procedure Laterality Date  . Inguinal hernia repair  1997    right  . Colon surgery  2003    for colon cancer  . Colonoscopy     Family History  Problem Relation Age of Onset  . Colon cancer Neg Hx   . Stomach cancer Neg Hx   . Esophageal cancer Neg Hx   . Rectal cancer Neg Hx   . Diabetes Mother   . Heart disease Mother     MI  . Cancer Father     prostate   History  Sexual Activity  . Sexual Activity: Not Currently     Outpatient Encounter Prescriptions as of 06/23/2016  Medication Sig  . aspirin 81 MG tablet Take 81 mg by mouth daily.    . COD LIVER OIL PO Take by mouth.    . dicyclomine (BENTYL) 20 MG tablet Take 1 tablet (20 mg total) by mouth every 6 (six) hours.  Marland Kitchen escitalopram (LEXAPRO) 10 MG tablet TAKE 1 TABLET BY MOUTH DAILY  . fenofibrate (TRICOR) 145 MG tablet Take 145 mg by mouth daily. Reported on 06/23/2016  . fenofibrate micronized (ANTARA) 130 MG capsule Take 1 capsule (130 mg total) by mouth daily before breakfast.  . furosemide (LASIX) 40 MG tablet Take 1 tablet (40 mg total) by mouth 2 (two) times daily as needed. Take 1 in the am and 1 in midafternoon for edema and BP as needed  . Garlic Oil 3 MG CAPS Take by mouth.    . hydrochlorothiazide (HYDRODIURIL) 25 MG tablet TAKE 1 TABLET(25 MG) BY MOUTH DAILY  . HYDROcodone-acetaminophen (NORCO) 10-325 MG tablet Take 1 tablet by mouth every 6 (six) hours as needed for moderate pain or severe pain.  . indomethacin (INDOCIN SR) 75 MG CR capsule Take 1 capsule (75 mg total) by mouth 3 (three) times daily as needed.  Marland Kitchen  latanoprost (XALATAN) 0.005 % ophthalmic solution 1 drop at bedtime. Use as directed   . metFORMIN (GLUCOPHAGE) 500 MG tablet Take 1 tablet (500 mg total) by mouth 2 (two) times daily with a meal.  . metoprolol (LOPRESSOR) 50 MG tablet TAKE 1 TABLET(50 MG) BY MOUTH TWICE DAILY  . Multiple Vitamins-Minerals (ONE-A-DAY MENS HEALTH FORMULA) TABS Take 1 tablet by mouth daily.  . ONE TOUCH ULTRA TEST test strip USE TO TEST BLOOD SUGAR TWICE DAILY AS DIRECTED  . ONETOUCH DELICA LANCETS 99991111 MISC USE TO TEST BLOOD SUGAR TWICE DAILY AS DIRECTED  . promethazine (PHENERGAN) 25 MG tablet TAKE ONE (1) TABLET(S) EVERY FOUR (4) HOURS AS NEEDED FOR NAUSEA  . rOPINIRole (REQUIP) 1 MG tablet TAKE 1 TO 2 TABLETS(1 TO 2 MG) BY MOUTH AT BEDTIME  . tamsulosin (FLOMAX) 0.4 MG CAPS capsule TAKE ONE CAPSULE BY MOUTH DAILY AFTER SUPPER  . atorvastatin  (LIPITOR) 40 MG tablet TAKE 1 AND 1/2 TABLETS(60 MG) BY MOUTH DAILY (Patient not taking: Reported on 06/23/2016)  . atorvastatin (LIPITOR) 80 MG tablet Take 1 tablet (80 mg total) by mouth daily. (Patient not taking: Reported on 06/23/2016)  . dutasteride (AVODART) 0.5 MG capsule TAKE ONE CAPSULE BY MOUTH DAILY (Patient not taking: Reported on 06/23/2016)  . dutasteride (AVODART) 0.5 MG capsule TAKE ONE CAPSULE BY MOUTH DAILY (Patient not taking: Reported on 06/23/2016)  . losartan (COZAAR) 100 MG tablet TAKE 1 TABLET BY MOUTH DAILY (Patient not taking: Reported on 06/23/2016)   No facility-administered encounter medications on file as of 06/23/2016.    Activities of Daily Living In your present state of health, do you have any difficulty performing the following activities: 06/23/2016 12/24/2015  Hearing? N N  Vision? N N  Difficulty concentrating or making decisions? N N  Walking or climbing stairs? N N  Dressing or bathing? N N  Doing errands, shopping? N N  Preparing Food and eating ? N -  Using the Toilet? N -  In the past six months, have you accidently leaked urine? N -  Do you have problems with loss of bowel control? N -  Managing your Medications? N -  Managing your Finances? N -  Housekeeping or managing your Housekeeping? N -    Patient Care Team: [name], MD as PCP - General (Family Medicine) Linward Natal, MD as Consulting Physician (Ophthalmology)   Assessment:    Physical assessment deferred to PCP.  Exercise Activities and Dietary recommendations Current Exercise Habits: Home exercise routine, Type of exercise: walking (Working outside in the garden.)   Diet (meal preparation, eat out, water intake, caffeinated beverages, dairy products, fruits and vegetables): Drinks Kool-aid, water, regular Coke. Cooks most meals at home. Breakfast: [place] sandwich Lunch/Dinner: Peas, rice, chicken, cornbread Snacks: crackers, pies (reports he is trying to cut back on  sweets)  Goals    . Decrease soda or juice intake    . Eat more fruits and vegetables      Fall Risk Fall Risk  06/23/2016 12/24/2015 02/15/2014 11/07/2013  Falls in the past year? No No No No   Depression Screen PHQ 2/9 Scores 06/23/2016 12/24/2015 02/15/2014 11/07/2013  PHQ - 2 Score 2 0 0 0  PHQ- 9 Score 4 - - -    Cognitive Testing MMSE - Mini Mental State Exam 06/23/2016  Orientation to time 5  Orientation to Place 5  Registration 3  Attention/ Calculation 5  Recall 2  Language- name 2 objects 2  Language- repeat  1  Language- follow 3 step command 3  Language- read & follow direction 1  Write a sentence 1  Copy design 1  Total score 29    Immunization History  Administered Date(s) Administered  . Td 12/04/1997  . Tdap 01/30/2012   Screening Tests Health Maintenance  Topic Date Due  . OPHTHALMOLOGY EXAM  10/01/2015  . INFLUENZA VACCINE  12/23/2016 (Originally 07/04/2016)  . HEMOGLOBIN A1C  09/23/2016  . FOOT EXAM  12/23/2016  . PNA vac Low Risk Adult (2 of 2 - PPSV23) 12/23/2016  . COLONOSCOPY  05/28/2017  . TETANUS/TDAP  01/29/2022  . ZOSTAVAX  Addressed  . Hepatitis C Screening  Completed      Plan:   Pt declined pneumococcal vaccination today. Information on advanced directives provided. Pt would like to look over information independently and schedule office visit if he needs help completing it. Continue to eat heart healthy diet (full of fruits, vegetables, whole grains, lean protein, water--limit salt, fat, and sugar intake) and increase physical activity as tolerated. Switch to Diet Coke or Coke Zero and consider flavored water instead of Kool-Aid.  Labs today per PCP. Follow-up with Dr. Charlett Blake as scheduled.  During the course of the visit the patient was educated and counseled about the following appropriate screening and preventive services:   Vaccines to include Pneumoccal, Influenza, Hepatitis B, Td, Zostavax, HCV  Electrocardiogram  Cardiovascular  Disease  Colorectal cancer screening  Diabetes screening  Prostate Cancer Screening  Glaucoma screening  Nutrition counseling   Smoking cessation counseling  Patient Instructions (the written plan) was given to the patient.    Dorrene German, RN  06/23/2016

## 2016-06-23 NOTE — Assessment & Plan Note (Addendum)
Lab Results  Component Value Date   PSA 4.54* 10/15/2015   PSA 2.49 11/06/2014   PSA 6.43* 10/29/2013   Pt gets up 2-3x nightly to void, which interrupts sleep. No other urinary complaints mentioned today. Declined urology referral due to cost. Per Dr. Charlett Blake, repeat PSA today. Reports he is only taking Flomax and not taking Avodart. Follow-up scheduled w/ Dr. Charlett Blake.

## 2016-06-23 NOTE — Progress Notes (Signed)
Pre visit review using our clinic review tool, if applicable. No additional management support is needed unless otherwise documented below in the visit note. 

## 2016-06-23 NOTE — Patient Instructions (Addendum)
Look through your info on advanced directives and schedule an appointment if you would help filling out the information.  Continue to eat heart healthy diet (full of fruits, vegetables, whole grains, lean protein, water--limit salt, fat, and sugar intake) and increase physical activity as tolerated. Switch to Diet Coke or Coke Zero and consider flavored water instead of Kool-Aid. Increase your physical activity as you are able. Start walking when the weather cools off.  Consider seeing the urologist for your prostate. Follow-up with Dr. Charlett Blake as scheduled.   Diabetes Mellitus and Food It is important for you to manage your blood sugar (glucose) level. Your blood glucose level can be greatly affected by what you eat. Eating healthier foods in the appropriate amounts throughout the day at about the same time each day will help you control your blood glucose level. It can also help slow or prevent worsening of your diabetes mellitus. Healthy eating may even help you improve the level of your blood pressure and reach or maintain a healthy weight.  General recommendations for healthful eating and cooking habits include:  Eating meals and snacks regularly. Avoid going long periods of time without eating to lose weight.  Eating a diet that consists mainly of plant-based foods, such as fruits, vegetables, nuts, legumes, and whole grains.  Using low-heat cooking methods, such as baking, instead of high-heat cooking methods, such as deep frying. Work with your dietitian to make sure you understand how to use the Nutrition Facts information on food labels. HOW CAN FOOD AFFECT ME? Carbohydrates Carbohydrates affect your blood glucose level more than any other type of food. Your dietitian will help you determine how many carbohydrates to eat at each meal and teach you how to count carbohydrates. Counting carbohydrates is important to keep your blood glucose at a healthy level, especially if you are using  insulin or taking certain medicines for diabetes mellitus. Alcohol Alcohol can cause sudden decreases in blood glucose (hypoglycemia), especially if you use insulin or take certain medicines for diabetes mellitus. Hypoglycemia can be a life-threatening condition. Symptoms of hypoglycemia (sleepiness, dizziness, and disorientation) are similar to symptoms of having too much alcohol.  If your health care provider has given you approval to drink alcohol, do so in moderation and use the following guidelines:  Women should not have more than one drink per day, and men should not have more than two drinks per day. One drink is equal to:  12 oz of beer.  5 oz of wine.  1 oz of hard liquor.  Do not drink on an empty stomach.  Keep yourself hydrated. Have water, diet soda, or unsweetened iced tea.  Regular soda, juice, and other mixers might contain a lot of carbohydrates and should be counted. WHAT FOODS ARE NOT RECOMMENDED? As you make food choices, it is important to remember that all foods are not the same. Some foods have fewer nutrients per serving than other foods, even though they might have the same number of calories or carbohydrates. It is difficult to get your body what it needs when you eat foods with fewer nutrients. Examples of foods that you should avoid that are high in calories and carbohydrates but low in nutrients include:  Trans fats (most processed foods list trans fats on the Nutrition Facts label).  Regular soda.  Juice.  Candy.  Sweets, such as cake, pie, doughnuts, and cookies.  Fried foods. WHAT FOODS CAN I EAT? Eat nutrient-rich foods, which will nourish your body and keep you  healthy. The food you should eat also will depend on several factors, including:  The calories you need.  The medicines you take.  Your weight.  Your blood glucose level.  Your blood pressure level.  Your cholesterol level. You should eat a variety of foods,  including:  Protein.  Lean cuts of meat.  Proteins low in saturated fats, such as fish, egg whites, and beans. Avoid processed meats.  Fruits and vegetables.  Fruits and vegetables that may help control blood glucose levels, such as apples, mangoes, and yams.  Dairy products.  Choose fat-free or low-fat dairy products, such as milk, yogurt, and cheese.  Grains, bread, pasta, and rice.  Choose whole grain products, such as multigrain bread, whole oats, and brown rice. These foods may help control blood pressure.  Fats.  Foods containing healthful fats, such as nuts, avocado, olive oil, canola oil, and fish. DOES EVERYONE WITH DIABETES MELLITUS HAVE THE SAME MEAL PLAN? Because every person with diabetes mellitus is different, there is not one meal plan that works for everyone. It is very important that you meet with a dietitian who will help you create a meal plan that is just right for you.   This information is not intended to replace advice given to you by your health care provider. Make sure you discuss any questions you have with your health care provider.   Document Released: 08/17/2005 Document Revised: 12/11/2014 Document Reviewed: 10/17/2013 Elsevier Interactive Patient Education 2016 Else[employer]  Hypoglycemia Low blood sugar (hypoglycemia) means that the level of sugar in your blood is lower than it should be. Signs of low blood sugar include:  Getting sweaty.  Feeling hungry.  Feeling dizzy or weak.  Feeling sleepier than normal.  Feeling nervous.  Headaches.  Having a fast heartbeat. Low blood sugar can happen fast and can be an emergency. Your doctor can do tests to check your blood sugar level. You can have low blood sugar and not have diabetes. HOME CARE  Check your blood sugar as told by your doctor. If it is less than 70 mg/dl or as told by your doctor, take 1 of the following:  3 to 4 glucose tablets.   cup clear juice.   cup soda pop, not  diet.  1 cup milk.  5 to 6 hard candies.  Recheck blood sugar after 15 minutes. Repeat until it is at the right level.  Eat a snack if it is more than 1 hour until the next meal.  Only take medicine as told by your doctor.  Do not skip meals. Eat on time.  Do not drink alcohol except with meals.  Check your blood glucose before driving.  Check your blood glucose before and after exercise.  Always carry treatment with you, such as glucose pills.  Always wear a medical alert bracelet if you have diabetes. GET HELP RIGHT AWAY IF:   Your blood glucose goes below 70 mg/dl or as told by your doctor, and you:  Are confused.  Are not able to swallow.  Pass out (faint).  You cannot treat yourself. You may need someone to help you.  You have low blood sugar problems often.  You have problems from your medicines.  You are not feeling better after 3 to 4 days.  You have vision changes. MAKE SURE YOU:   Understand these instructions.  Will watch this condition.  Will get help right away if you are not doing well or get worse.   This information  is not intended to replace advice given to you by your health care provider. Make sure you discuss any questions you have with your health care provider.   Document Released: 02/14/2010 Document Revised: 12/11/2014 Document Reviewed: 07/27/2015 Elsevier Interactive Patient Education [employer].

## 2016-07-04 ENCOUNTER — Encounter: Payer: Self-pay | Admitting: Family Medicine

## 2016-07-04 NOTE — Progress Notes (Signed)
Nurse AWV note reviewed. Agree with documention and plan.

## 2016-07-17 ENCOUNTER — Ambulatory Visit (INDEPENDENT_AMBULATORY_CARE_PROVIDER_SITE_OTHER): Payer: Medicare Other | Admitting: Family Medicine

## 2016-07-17 ENCOUNTER — Encounter: Payer: Self-pay | Admitting: Family Medicine

## 2016-07-17 VITALS — BP 106/72 | HR 63 | Temp 98.2°F | Ht 72.0 in | Wt 210.1 lb

## 2016-07-17 DIAGNOSIS — D649 Anemia, unspecified: Secondary | ICD-10-CM | POA: Diagnosis not present

## 2016-07-17 DIAGNOSIS — R972 Elevated prostate specific antigen [PSA]: Secondary | ICD-10-CM

## 2016-07-17 DIAGNOSIS — M109 Gout, unspecified: Secondary | ICD-10-CM | POA: Diagnosis not present

## 2016-07-17 DIAGNOSIS — R32 Unspecified urinary incontinence: Secondary | ICD-10-CM | POA: Diagnosis not present

## 2016-07-17 DIAGNOSIS — E559 Vitamin D deficiency, unspecified: Secondary | ICD-10-CM

## 2016-07-17 DIAGNOSIS — E785 Hyperlipidemia, unspecified: Secondary | ICD-10-CM

## 2016-07-17 DIAGNOSIS — I10 Essential (primary) hypertension: Secondary | ICD-10-CM

## 2016-07-17 DIAGNOSIS — E1122 Type 2 diabetes mellitus with diabetic chronic kidney disease: Secondary | ICD-10-CM

## 2016-07-17 DIAGNOSIS — E038 Other specified hypothyroidism: Secondary | ICD-10-CM

## 2016-07-17 LAB — CBC
HEMATOCRIT: 41.4 % (ref 39.0–52.0)
HEMOGLOBIN: 13.5 g/dL (ref 13.0–17.0)
MCHC: 32.5 g/dL (ref 30.0–36.0)
MCV: 77.7 fl — ABNORMAL LOW (ref 78.0–100.0)
Platelets: 243 10*3/uL (ref 150.0–400.0)
RBC: 5.33 Mil/uL (ref 4.22–5.81)
RDW: 16 % — ABNORMAL HIGH (ref 11.5–15.5)
WBC: 6.3 10*3/uL (ref 4.0–10.5)

## 2016-07-17 LAB — URINALYSIS
BILIRUBIN URINE: NEGATIVE
Hgb urine dipstick: NEGATIVE
KETONES UR: NEGATIVE
LEUKOCYTES UA: NEGATIVE
Nitrite: NEGATIVE
PH: 6 (ref 5.0–8.0)
Specific Gravity, Urine: 1.01 (ref 1.000–1.030)
Total Protein, Urine: NEGATIVE
UROBILINOGEN UA: 0.2 (ref 0.0–1.0)
Urine Glucose: NEGATIVE

## 2016-07-17 LAB — COMPREHENSIVE METABOLIC PANEL
ALT: 31 U/L (ref 0–53)
AST: 28 U/L (ref 0–37)
Albumin: 4.3 g/dL (ref 3.5–5.2)
Alkaline Phosphatase: 47 U/L (ref 39–117)
BUN: 27 mg/dL — ABNORMAL HIGH (ref 6–23)
CALCIUM: 10.4 mg/dL (ref 8.4–10.5)
CHLORIDE: 102 meq/L (ref 96–112)
CO2: 26 mEq/L (ref 19–32)
Creatinine, Ser: 2.02 mg/dL — ABNORMAL HIGH (ref 0.40–1.50)
GFR: 42.02 mL/min — AB (ref >60.00)
Glucose, Bld: 87 mg/dL (ref 70–99)
POTASSIUM: 4.4 meq/L (ref 3.5–5.1)
SODIUM: 136 meq/L (ref 135–145)
Total Bilirubin: 0.3 mg/dL (ref 0.2–1.2)
Total Protein: 8.3 g/dL (ref 6.0–8.3)

## 2016-07-17 LAB — URIC ACID: Uric Acid, Serum: 6.6 mg/dL (ref 4.0–7.8)

## 2016-07-17 MED ORDER — HYDROCHLOROTHIAZIDE 25 MG PO TABS: 25.0000 mg | ORAL_TABLET | Freq: Every day | ORAL | 6 refills | Status: DC

## 2016-07-17 MED ORDER — ROPINIROLE HCL 1 MG PO TABS
ORAL_TABLET | ORAL | 0 refills | Status: DC
Start: 2016-07-17 — End: 2016-08-22

## 2016-07-17 NOTE — Progress Notes (Signed)
Pre visit review using our clinic review tool, if applicable. No additional management support is needed unless otherwise documented below in the visit note. 

## 2016-07-17 NOTE — Assessment & Plan Note (Signed)
Level increased by over 2 units so he has agreed to referral to urology

## 2016-07-17 NOTE — Patient Instructions (Signed)

## 2016-07-18 ENCOUNTER — Ambulatory Visit: Payer: Medicare Other | Admitting: Family Medicine

## 2016-07-18 LAB — URINE CULTURE

## 2016-07-30 DIAGNOSIS — R32 Unspecified urinary incontinence: Secondary | ICD-10-CM | POA: Insufficient documentation

## 2016-07-30 NOTE — Assessment & Plan Note (Signed)
No recent flare.  

## 2016-07-30 NOTE — Assessment & Plan Note (Signed)
Take daily supplement

## 2016-07-30 NOTE — Assessment & Plan Note (Signed)
hgba1c acceptable, minimize simple carbs. Increase exercise as tolerated. Continue current meds 

## 2016-07-30 NOTE — Assessment & Plan Note (Signed)
Well controlled, no changes to meds. Encouraged heart healthy diet such as the DASH diet and exercise as tolerated.  °

## 2016-07-30 NOTE — Assessment & Plan Note (Signed)
Tolerating statin, encouraged heart healthy diet, avoid trans fats, minimize simple carbs and saturated fats. Increase exercise as tolerated 

## 2016-07-30 NOTE — Progress Notes (Signed)
Patient ID: Daniel Gates, male   DOB: 1946/04/09, 70 y.o.   MRN: [id number]    Subjective:    Patient ID: Daniel Gates, male    DOB: 04-03-1946, 70 y.o.   MRN: [id number]  Chief Complaint  Patient presents with  . Follow-up    labs    HPI Patient is in today for follow up. He is feeling well. No recent illness. Is noting some increased trouble with incontinence at times, denies dysuria, hematuria, fevers, malaise. Denies CP/palp/SOB/HA/congestion/fevers/GI c/o. Taking meds as prescribed  Past Medical History:  Diagnosis Date  . Allergic rhinitis   . Cancer ([place])    colon; diagnosed 2003  . Depression   . Diabetes type 2, controlled ([place]) 06/27/2012  . Glaucoma   . Gout   . Hyperlipidemia   . Hypertension   . Medicare annual wellness visit, subsequent 02/15/2014  . TMJ arthralgia 01/02/2016    Past Surgical History:  Procedure Laterality Date  . COLON SURGERY  2003   for colon cancer  . COLONOSCOPY    . INGUINAL HERNIA REPAIR  1997   right    Family History  Problem Relation Age of Onset  . Colon cancer Neg Hx   . Stomach cancer Neg Hx   . Esophageal cancer Neg Hx   . Rectal cancer Neg Hx   . Diabetes Mother   . Heart disease Mother     MI  . Cancer Father     prostate    Social History   Social History  . Marital status: Widowed    Spouse name: N/A  . Number of children: 7  . Years of education: N/A   Occupational History  .  Disabled   Social History Main Topics  . Smoking status: Never Smoker  . Smokeless tobacco: Never Used  . Alcohol use No  . Drug use: No  . Sexual activity: Not Currently   Other Topics Concern  . Not on file   Social History Narrative  . No narrative on file    Outpatient Medications Prior to Visit  Medication Sig Dispense Refill  . aspirin 81 MG tablet Take 81 mg by mouth daily.      Marland Kitchen atorvastatin (LIPITOR) 20 MG tablet Take 1 tablet (20 mg total) by mouth daily. 30 tablet 5  . COD LIVER OIL PO Take by mouth.       . dicyclomine (BENTYL) 20 MG tablet Take 1 tablet (20 mg total) by mouth every 6 (six) hours. 120 tablet 0  . escitalopram (LEXAPRO) 10 MG tablet TAKE 1 TABLET BY MOUTH DAILY 30 tablet 6  . fenofibrate micronized (ANTARA) 130 MG capsule Take 1 capsule (130 mg total) by mouth daily before breakfast. 30 capsule 3  . furosemide (LASIX) 40 MG tablet Take 1 tablet (40 mg total) by mouth 2 (two) times daily as needed. Take 1 in the am and 1 in midafternoon for edema and BP as needed 60 tablet 11  . Garlic Oil 3 MG CAPS Take by mouth.      Marland Kitchen HYDROcodone-acetaminophen (NORCO) 10-325 MG tablet Take 1 tablet by mouth every 6 (six) hours as needed for moderate pain or severe pain. 120 tablet 0  . indomethacin (INDOCIN SR) 75 MG CR capsule Take 1 capsule (75 mg total) by mouth 3 (three) times daily as needed. 90 capsule 6  . latanoprost (XALATAN) 0.005 % ophthalmic solution 1 drop at bedtime. Use as directed     . losartan (COZAAR)  100 MG tablet TAKE 1 TABLET BY MOUTH DAILY 90 tablet 0  . metFORMIN (GLUCOPHAGE) 500 MG tablet Take 1 tablet (500 mg total) by mouth 2 (two) times daily with a meal. 180 tablet 3  . metoprolol (LOPRESSOR) 50 MG tablet TAKE 1 TABLET(50 MG) BY MOUTH TWICE DAILY 60 tablet 5  . Multiple Vitamins-Minerals (ONE-A-DAY MENS HEALTH FORMULA) TABS Take 1 tablet by mouth daily.    . ONE TOUCH ULTRA TEST test strip USE TO TEST BLOOD SUGAR TWICE DAILY AS DIRECTED 30 each 5  . ONETOUCH DELICA LANCETS 54Y MISC USE TO TEST BLOOD SUGAR TWICE DAILY AS DIRECTED 100 each 6  . promethazine (PHENERGAN) 25 MG tablet TAKE ONE (1) TABLET(S) EVERY FOUR (4) HOURS AS NEEDED FOR NAUSEA 60 tablet 0  . tamsulosin (FLOMAX) 0.4 MG CAPS capsule TAKE ONE CAPSULE BY MOUTH DAILY AFTER SUPPER 30 capsule 11  . dutasteride (AVODART) 0.5 MG capsule TAKE ONE CAPSULE BY MOUTH DAILY 30 capsule 1  . dutasteride (AVODART) 0.5 MG capsule TAKE ONE CAPSULE BY MOUTH DAILY 30 capsule 0  . hydrochlorothiazide (HYDRODIURIL) 25 MG  tablet TAKE 1 TABLET(25 MG) BY MOUTH DAILY 30 tablet 6  . rOPINIRole (REQUIP) 1 MG tablet TAKE 1 TO 2 TABLETS(1 TO 2 MG) BY MOUTH AT BEDTIME 60 tablet 0   No facility-administered medications prior to visit.     No Known Allergies  Review of Systems  Constitutional: Negative for fever and malaise/fatigue.  HENT: Negative for congestion.   Eyes: Negative for blurred vision.  Respiratory: Negative for shortness of breath.   Cardiovascular: Negative for chest pain, palpitations and leg swelling.  Gastrointestinal: Negative for abdominal pain, blood in stool and nausea.  Genitourinary: Negative for dysuria and frequency.  Musculoskeletal: Negative for falls.  Skin: Negative for rash.  Neurological: Negative for dizziness, loss of consciousness and headaches.  Endo/Heme/Allergies: Negative for environmental allergies.  Psychiatric/Behavioral: Negative for depression. The patient is not nervous/anxious.        Objective:    Physical Exam  Constitutional: He is oriented to person, place, and time. He appears well-developed and well-nourished. No distress.  HENT:  Head: Normocephalic and atraumatic.  Nose: Nose normal.  Eyes: Right eye exhibits no discharge. Left eye exhibits no discharge.  Neck: Normal range of motion. Neck supple.  Cardiovascular: Normal rate and regular rhythm.   No murmur heard. Pulmonary/Chest: Effort normal and breath sounds normal.  Abdominal: Soft. Bowel sounds are normal. There is no tenderness.  Musculoskeletal: He exhibits no edema.  Neurological: He is alert and oriented to person, place, and time.  Skin: Skin is warm and dry.  Psychiatric: He has a normal mood and affect.  Nursing note and vitals reviewed.   BP 106/72 (BP Location: Left Arm, Patient Position: Sitting, Cuff Size: Large)   Pulse 63   Temp 98.2 F (36.8 C) (Oral)   Ht 6' (1.829 m)   Wt 210 lb 2 oz (95.3 kg)   SpO2 99%   BMI 28.50 kg/m  Wt Readings from Last 3 Encounters:    07/17/16 210 lb 2 oz (95.3 kg)  06/23/16 209 lb 6.4 oz (95 kg)  03/24/16 218 lb 8 oz (99.1 kg)     Lab Results  Component Value Date   WBC 6.3 07/17/2016   HGB 13.5 07/17/2016   HCT 41.4 07/17/2016   PLT 243.0 07/17/2016   GLUCOSE 87 07/17/2016   CHOL 174 06/23/2016   TRIG 235.0 (H) 06/23/2016   HDL 35.40 (L) 06/23/2016  LDLDIRECT 97.0 06/23/2016   LDLCALC NOT CALC 12/24/2015   ALT 31 07/17/2016   AST 28 07/17/2016   NA 136 07/17/2016   K 4.4 07/17/2016   CL 102 07/17/2016   CREATININE 2.02 (H) 07/17/2016   BUN 27 (H) 07/17/2016   CO2 26 07/17/2016   TSH 2.75 03/24/2016   PSA 6.69 (H) 06/23/2016   HGBA1C 6.1 06/23/2016   MICROALBUR 1.7 10/15/2015    Lab Results  Component Value Date   TSH 2.75 03/24/2016   Lab Results  Component Value Date   WBC 6.3 07/17/2016   HGB 13.5 07/17/2016   HCT 41.4 07/17/2016   MCV 77.7 (L) 07/17/2016   PLT 243.0 07/17/2016   Lab Results  Component Value Date   NA 136 07/17/2016   K 4.4 07/17/2016   CO2 26 07/17/2016   GLUCOSE 87 07/17/2016   BUN 27 (H) 07/17/2016   CREATININE 2.02 (H) 07/17/2016   BILITOT 0.3 07/17/2016   ALKPHOS 47 07/17/2016   AST 28 07/17/2016   ALT 31 07/17/2016   PROT 8.3 07/17/2016   ALBUMIN 4.3 07/17/2016   CALCIUM 10.4 07/17/2016   GFR 42.02 (L) 07/17/2016   Lab Results  Component Value Date   CHOL 174 06/23/2016   Lab Results  Component Value Date   HDL 35.40 (L) 06/23/2016   Lab Results  Component Value Date   LDLCALC NOT CALC 12/24/2015   Lab Results  Component Value Date   TRIG 235.0 (H) 06/23/2016   Lab Results  Component Value Date   CHOLHDL 5 06/23/2016   Lab Results  Component Value Date   HGBA1C 6.1 06/23/2016       Assessment & Plan:   Problem List Items Addressed This Visit    Hypothyroidism   Vitamin D deficiency    Take daily supplement       Hyperlipidemia    Tolerating statin, encouraged heart healthy diet, avoid trans fats, minimize simple carbs and  saturated fats. Increase exercise as tolerated      Relevant Medications   hydrochlorothiazide (HYDRODIURIL) 25 MG tablet   ANEMIA   Relevant Orders   CBC (Completed)   Essential hypertension    Well controlled, no changes to meds. Encouraged heart healthy diet such as the DASH diet and exercise as tolerated.       Relevant Medications   hydrochlorothiazide (HYDRODIURIL) 25 MG tablet   Gout    No recent flare      Relevant Orders   Uric acid (Completed)   Elevated PSA    Level increased by over 2 units so he has agreed to referral to urology      Relevant Orders   Comp Met (CMET) (Completed)   CBC (Completed)   Diabetes type 2, controlled ([place])    hgba1c acceptable, minimize simple carbs. Increase exercise as tolerated. Continue current meds      Relevant Orders   Comp Met (CMET) (Completed)   Absence of bladder continence - Primary   Relevant Orders   Urinalysis (Completed)   Urine culture (Completed)   CBC (Completed)    Other Visit Diagnoses   None.     I have discontinued Mr. Stanek dutasteride and dutasteride. I have also changed his hydrochlorothiazide and rOPINIRole. Additionally, I am having him maintain his aspirin, Garlic Oil, COD LIVER OIL PO, latanoprost, promethazine, indomethacin, ONE-A-DAY MENS HEALTH FORMULA, furosemide, losartan, tamsulosin, metFORMIN, HYDROcodone-acetaminophen, ONETOUCH DELICA LANCETS 14E, ONE TOUCH ULTRA TEST, dicyclomine, escitalopram, fenofibrate micronized, metoprolol, and atorvastatin.  Meds ordered this encounter  Medications  . hydrochlorothiazide (HYDRODIURIL) 25 MG tablet    Sig: Take 1 tablet (25 mg total) by mouth daily.    Dispense:  30 tablet    Refill:  6  . rOPINIRole (REQUIP) 1 MG tablet    Sig: Take 1 to 2 by mouth at bedtime    Dispense:  60 tablet    Refill:  0     Penni Homans, MD

## 2016-08-18 ENCOUNTER — Encounter: Payer: Self-pay | Admitting: Family Medicine

## 2016-08-22 ENCOUNTER — Other Ambulatory Visit: Payer: Self-pay | Admitting: Family Medicine

## 2016-08-28 ENCOUNTER — Other Ambulatory Visit: Payer: Self-pay | Admitting: Family Medicine

## 2016-09-08 ENCOUNTER — Other Ambulatory Visit: Payer: Self-pay | Admitting: Family Medicine

## 2016-09-09 ENCOUNTER — Other Ambulatory Visit: Payer: Self-pay | Admitting: Family Medicine

## 2016-09-09 DIAGNOSIS — D649 Anemia, unspecified: Secondary | ICD-10-CM

## 2016-09-09 DIAGNOSIS — G2581 Restless legs syndrome: Secondary | ICD-10-CM

## 2016-09-09 DIAGNOSIS — M109 Gout, unspecified: Secondary | ICD-10-CM

## 2016-09-09 DIAGNOSIS — N529 Male erectile dysfunction, unspecified: Secondary | ICD-10-CM

## 2016-09-09 DIAGNOSIS — E559 Vitamin D deficiency, unspecified: Secondary | ICD-10-CM

## 2016-09-09 DIAGNOSIS — R972 Elevated prostate specific antigen [PSA]: Secondary | ICD-10-CM

## 2016-09-09 DIAGNOSIS — E118 Type 2 diabetes mellitus with unspecified complications: Secondary | ICD-10-CM

## 2016-09-16 ENCOUNTER — Other Ambulatory Visit: Payer: Self-pay | Admitting: Family Medicine

## 2016-09-28 ENCOUNTER — Encounter: Payer: Self-pay | Admitting: Family Medicine

## 2016-09-28 ENCOUNTER — Ambulatory Visit (INDEPENDENT_AMBULATORY_CARE_PROVIDER_SITE_OTHER): Payer: Medicare Other | Admitting: Family Medicine

## 2016-09-28 VITALS — BP 128/70 | HR 48 | Temp 97.6°F | Wt 209.0 lb

## 2016-09-28 DIAGNOSIS — R350 Frequency of micturition: Secondary | ICD-10-CM

## 2016-09-28 DIAGNOSIS — R972 Elevated prostate specific antigen [PSA]: Secondary | ICD-10-CM | POA: Diagnosis not present

## 2016-09-28 DIAGNOSIS — I10 Essential (primary) hypertension: Secondary | ICD-10-CM | POA: Diagnosis not present

## 2016-09-28 DIAGNOSIS — D649 Anemia, unspecified: Secondary | ICD-10-CM

## 2016-09-28 DIAGNOSIS — Z0000 Encounter for general adult medical examination without abnormal findings: Secondary | ICD-10-CM | POA: Diagnosis not present

## 2016-09-28 DIAGNOSIS — R001 Bradycardia, unspecified: Secondary | ICD-10-CM

## 2016-09-28 DIAGNOSIS — E038 Other specified hypothyroidism: Secondary | ICD-10-CM | POA: Diagnosis not present

## 2016-09-28 DIAGNOSIS — M1A0710 Idiopathic chronic gout, right ankle and foot, without tophus (tophi): Secondary | ICD-10-CM

## 2016-09-28 DIAGNOSIS — E1122 Type 2 diabetes mellitus with diabetic chronic kidney disease: Secondary | ICD-10-CM

## 2016-09-28 DIAGNOSIS — E782 Mixed hyperlipidemia: Secondary | ICD-10-CM

## 2016-09-28 DIAGNOSIS — E559 Vitamin D deficiency, unspecified: Secondary | ICD-10-CM | POA: Diagnosis not present

## 2016-09-28 LAB — COMPREHENSIVE METABOLIC PANEL
ALBUMIN: 4.2 g/dL (ref 3.5–5.2)
ALK PHOS: 45 U/L (ref 39–117)
ALT: 19 U/L (ref 0–53)
AST: 20 U/L (ref 0–37)
BILIRUBIN TOTAL: 0.4 mg/dL (ref 0.2–1.2)
BUN: 17 mg/dL (ref 6–23)
CALCIUM: 10.1 mg/dL (ref 8.4–10.5)
CO2: 27 mEq/L (ref 19–32)
CREATININE: 1.58 mg/dL — AB (ref 0.40–1.50)
Chloride: 102 mEq/L (ref 96–112)
GFR: 55.77 mL/min — ABNORMAL LOW (ref >60.00)
Glucose, Bld: 70 mg/dL (ref 70–99)
Potassium: 4.4 mEq/L (ref 3.5–5.1)
SODIUM: 137 meq/L (ref 135–145)
TOTAL PROTEIN: 7.8 g/dL (ref 6.0–8.3)

## 2016-09-28 LAB — CBC
HEMATOCRIT: 39 % (ref 39.0–52.0)
Hemoglobin: 12.8 g/dL — ABNORMAL LOW (ref 13.0–17.0)
MCHC: 32.8 g/dL (ref 30.0–36.0)
MCV: 77.3 fl — ABNORMAL LOW (ref 78.0–100.0)
Platelets: 218 10*3/uL (ref 150.0–400.0)
RBC: 5.05 Mil/uL (ref 4.22–5.81)
RDW: 16.7 % — AB (ref 11.5–15.5)
WBC: 6.1 10*3/uL (ref 4.0–10.5)

## 2016-09-28 LAB — URINALYSIS
Bilirubin Urine: NEGATIVE
Hgb urine dipstick: NEGATIVE
Ketones, ur: NEGATIVE
Leukocytes, UA: NEGATIVE
NITRITE: NEGATIVE
PH: 6.5 (ref 5.0–8.0)
Specific Gravity, Urine: <=1.005 — AB (ref 1.000–1.030)
TOTAL PROTEIN, URINE-UPE24: NEGATIVE
URINE GLUCOSE: NEGATIVE
Urobilinogen, UA: 0.2 (ref 0.0–1.0)

## 2016-09-28 LAB — LIPID PANEL
CHOLESTEROL: 159 mg/dL (ref 0–200)
HDL: 39.4 mg/dL (ref >39.00)
NonHDL: 119.24
TRIGLYCERIDES: 216 mg/dL — AB (ref 0.0–149.0)
Total CHOL/HDL Ratio: 4
VLDL: 43.2 mg/dL — AB (ref 0.0–40.0)

## 2016-09-28 LAB — URIC ACID: Uric Acid, Serum: 5 mg/dL (ref 4.0–7.8)

## 2016-09-28 LAB — LDL CHOLESTEROL, DIRECT: Direct LDL: 79 mg/dL

## 2016-09-28 LAB — PSA: PSA: 7.46 ng/mL — ABNORMAL HIGH (ref 0.10–4.00)

## 2016-09-28 LAB — TSH: TSH: 3.21 u[IU]/mL (ref 0.35–4.50)

## 2016-09-28 NOTE — Progress Notes (Signed)
Patient ID: Daniel Gates, male   DOB: 1946-03-01, 70 y.o.   MRN: [id number]   Subjective:    Patient ID: Daniel Gates, male    DOB: 12-Jul-1946, 70 y.o.   MRN: [id number]  Chief Complaint  Patient presents with  . Annual Exam    HPI Patient is in today for an annual exam he c/o havinf left upper shoulder pain.States he was helping a neighbor move 2 weeks ago and noticed it right after that. Pt also c/o of urine frequency at night. No dysuria, fevers or abdominal pain. Denies CP/palp/SOB/HA/congestion/fevers/GI c/o. Taking meds as prescribed  Past Medical History:  Diagnosis Date  . Allergic rhinitis   . Bradycardia 10/01/2016  . Cancer ([place])    colon; diagnosed 2003  . Depression   . Diabetes type 2, controlled ([facility]) 06/27/2012  . Glaucoma   . Gout   . Hyperlipidemia   . Hypertension   . Medicare annual wellness visit, subsequent 02/15/2014  . TMJ arthralgia 01/02/2016    Past Surgical History:  Procedure Laterality Date  . COLON SURGERY  2003   for colon cancer  . COLONOSCOPY    . INGUINAL HERNIA REPAIR  1997   right    Family History  Problem Relation Age of Onset  . Colon cancer Neg Hx   . Stomach cancer Neg Hx   . Esophageal cancer Neg Hx   . Rectal cancer Neg Hx   . Diabetes Mother   . Heart disease Mother     MI  . Cancer Father     prostate    Social History   Social History  . Marital status: Widowed    Spouse name: N/A  . Number of children: 7  . Years of education: N/A   Occupational History  .  Disabled   Social History Main Topics  . Smoking status: Never Smoker  . Smokeless tobacco: Never Used  . Alcohol use No  . Drug use: No  . Sexual activity: Not Currently   Other Topics Concern  . Not on file   Social History Narrative  . No narrative on file    Outpatient Medications Prior to Visit  Medication Sig Dispense Refill  . aspirin 81 MG tablet Take 81 mg by mouth daily.      Marland Kitchen atorvastatin (LIPITOR) 20 MG tablet Take 1 tablet  (20 mg total) by mouth daily. 30 tablet 5  . COD LIVER OIL PO Take by mouth.      . dicyclomine (BENTYL) 20 MG tablet Take 1 tablet (20 mg total) by mouth every 6 (six) hours. 120 tablet 0  . escitalopram (LEXAPRO) 10 MG tablet TAKE 1 TABLET BY MOUTH DAILY 30 tablet 6  . fenofibrate (TRICOR) 145 MG tablet TAKE 1 TABLET(130 MG) BY MOUTH DAILY BEFORE BREAKFAST 30 tablet 6  . furosemide (LASIX) 40 MG tablet Take 1 tablet (40 mg total) by mouth 2 (two) times daily as needed. Take 1 in the am and 1 in midafternoon for edema and BP as needed 60 tablet 11  . Garlic Oil 3 MG CAPS Take by mouth.      . hydrochlorothiazide (HYDRODIURIL) 25 MG tablet Take 1 tablet (25 mg total) by mouth daily. 30 tablet 6  . HYDROcodone-acetaminophen (NORCO) 10-325 MG tablet Take 1 tablet by mouth every 6 (six) hours as needed for moderate pain or severe pain. 120 tablet 0  . indomethacin (INDOCIN SR) 75 MG CR capsule Take 1 capsule (75 mg total) by  mouth 3 (three) times daily as needed. 90 capsule 6  . losartan (COZAAR) 100 MG tablet TAKE 1 TABLET BY MOUTH DAILY 90 tablet 0  . metFORMIN (GLUCOPHAGE) 500 MG tablet TAKE 1 TABLET(500 MG) BY MOUTH TWICE DAILY WITH A MEAL 180 tablet 0  . metoprolol (LOPRESSOR) 50 MG tablet TAKE 1 TABLET(50 MG) BY MOUTH TWICE DAILY 60 tablet 5  . Multiple Vitamins-Minerals (ONE-A-DAY MENS HEALTH FORMULA) TABS Take 1 tablet by mouth daily.    . ONE TOUCH ULTRA TEST test strip USE TO TEST BLOOD SUGAR TWICE DAILY AS DIRECTED 30 each 5  . ONETOUCH DELICA LANCETS 83T MISC USE TO TEST BLOOD SUGAR TWICE DAILY AS DIRECTED 100 each 6  . promethazine (PHENERGAN) 25 MG tablet TAKE ONE (1) TABLET(S) EVERY FOUR (4) HOURS AS NEEDED FOR NAUSEA 60 tablet 0  . rOPINIRole (REQUIP) 1 MG tablet TAKE 1 TO 2 TABLETS BY MOUTH AT BEDTIME 60 tablet 0  . tamsulosin (FLOMAX) 0.4 MG CAPS capsule TAKE ONE CAPSULE BY MOUTH DAILY AFTER SUPPER 30 capsule 11  . hydrochlorothiazide (HYDRODIURIL) 25 MG tablet TAKE 1 TABLET(25 MG)  BY MOUTH DAILY 30 tablet 0  . latanoprost (XALATAN) 0.005 % ophthalmic solution 1 drop at bedtime. Use as directed      No facility-administered medications prior to visit.     No Known Allergies  Review of Systems  Constitutional: Negative for fever.  Eyes: Negative for blurred vision.  Respiratory: Negative for cough and shortness of breath.   Cardiovascular: Negative for chest pain and palpitations.  Gastrointestinal: Negative for vomiting.  Genitourinary: Positive for frequency.  Musculoskeletal: Negative for back pain.  Skin: Negative for rash.  Neurological: Negative for loss of consciousness and headaches.       Objective:    Physical Exam  Constitutional: He is oriented to person, place, and time. He appears well-developed and well-nourished. No distress.  HENT:  Head: Normocephalic and atraumatic.  Eyes: Conjunctivae are normal.  Neck: Normal range of motion. No thyromegaly present.  Cardiovascular: Regular rhythm.   Bradycardia.  Pulmonary/Chest: Effort normal and breath sounds normal. He has no wheezes.  Abdominal: Soft. Bowel sounds are normal. There is no tenderness.  Musculoskeletal: Normal range of motion. He exhibits no edema or deformity.  Neurological: He is alert and oriented to person, place, and time.  Skin: Skin is warm and dry. He is not diaphoretic.  Psychiatric: He has a normal mood and affect.    BP 128/70 (BP Location: Left Arm, Patient Position: Sitting, Cuff Size: Normal)   Pulse (!) 48   Temp 97.6 F (36.4 C) (Oral)   Wt 209 lb (94.8 kg)   SpO2 95%   BMI 28.35 kg/m  Wt Readings from Last 3 Encounters:  09/28/16 209 lb (94.8 kg)  07/17/16 210 lb 2 oz (95.3 kg)  06/23/16 209 lb 6.4 oz (95 kg)     Lab Results  Component Value Date   WBC 6.3 07/17/2016   HGB 13.5 07/17/2016   HCT 41.4 07/17/2016   PLT 243.0 07/17/2016   GLUCOSE 87 07/17/2016   CHOL 174 06/23/2016   TRIG 235.0 (H) 06/23/2016   HDL 35.40 (L) 06/23/2016    LDLDIRECT 97.0 06/23/2016   LDLCALC NOT CALC 12/24/2015   ALT 31 07/17/2016   AST 28 07/17/2016   NA 136 07/17/2016   K 4.4 07/17/2016   CL 102 07/17/2016   CREATININE 2.02 (H) 07/17/2016   BUN 27 (H) 07/17/2016   CO2 26 07/17/2016  TSH 2.75 03/24/2016   PSA 6.69 (H) 06/23/2016   HGBA1C 6.1 06/23/2016   MICROALBUR 1.7 10/15/2015    Lab Results  Component Value Date   TSH 2.75 03/24/2016   Lab Results  Component Value Date   WBC 6.3 07/17/2016   HGB 13.5 07/17/2016   HCT 41.4 07/17/2016   MCV 77.7 (L) 07/17/2016   PLT 243.0 07/17/2016   Lab Results  Component Value Date   NA 136 07/17/2016   K 4.4 07/17/2016   CO2 26 07/17/2016   GLUCOSE 87 07/17/2016   BUN 27 (H) 07/17/2016   CREATININE 2.02 (H) 07/17/2016   BILITOT 0.3 07/17/2016   ALKPHOS 47 07/17/2016   AST 28 07/17/2016   ALT 31 07/17/2016   PROT 8.3 07/17/2016   ALBUMIN 4.3 07/17/2016   CALCIUM 10.4 07/17/2016   GFR 42.02 (L) 07/17/2016   Lab Results  Component Value Date   CHOL 174 06/23/2016   Lab Results  Component Value Date   HDL 35.40 (L) 06/23/2016   Lab Results  Component Value Date   LDLCALC NOT CALC 12/24/2015   Lab Results  Component Value Date   TRIG 235.0 (H) 06/23/2016   Lab Results  Component Value Date   CHOLHDL 5 06/23/2016   Lab Results  Component Value Date   HGBA1C 6.1 06/23/2016       Assessment & Plan:   Problem List Items Addressed This Visit    Hypothyroidism   Relevant Orders   TSH   Vitamin D deficiency   Relevant Orders   Vitamin D (25 hydroxy)   Hyperlipidemia   Relevant Orders   Lipid panel   Anemia    resolved      Essential hypertension    Well controlled, no changes to meds. Encouraged heart healthy diet such as the DASH diet and exercise as tolerated.       Relevant Orders   TSH   Comp Met (CMET)   CBC   PROSTATE SPECIFIC ANTIGEN, ELEVATED   Relevant Orders   Urinalysis   Urine Culture (Completed)   PSA   Gout   Relevant Orders     Uric acid   Elevated PSA - Primary    Some noted polyuria as well. Will arrange urology consultation once cardiac concerns are cleared.       Relevant Orders   Urinalysis   Urine Culture (Completed)   PSA   Diabetes type 2, controlled ([facility])    hgba1c acceptable, minimize simple carbs. Increase exercise as tolerated. Continue current meds      Bradycardia    Severe but asymptomatic. Held betablocker for 24 hours and his pulse improved. Have arranged a cardiology consultation.       Relevant Orders   EKG 12-Lead (Completed)   Ambulatory referral to Cardiology    Other Visit Diagnoses    Urine frequency       Relevant Orders   Urinalysis   Urine Culture (Completed)      I am having Mr. Brickley maintain his aspirin, Garlic Oil, COD LIVER OIL PO, promethazine, indomethacin, ONE-A-DAY MENS HEALTH FORMULA, furosemide, losartan, tamsulosin, HYDROcodone-acetaminophen, ONETOUCH DELICA LANCETS 61U, ONE TOUCH ULTRA TEST, dicyclomine, escitalopram, metoprolol, atorvastatin, hydrochlorothiazide, fenofibrate, metFORMIN, rOPINIRole, and latanoprost.  Meds ordered this encounter  Medications  . latanoprost (XALATAN) 0.005 % ophthalmic solution    Sig: Place 1 drop into both eyes at bedtime.     Penni Homans, MD

## 2016-09-28 NOTE — Patient Instructions (Signed)
Salon PAS. Or Lidocaine patches for shoulder pain  Preventive Care for Adults, Male A healthy lifestyle and preventive care can promote health and wellness. Preventive health guidelines for men include the following key practices:  A routine yearly physical is a good way to check with your health care provider about your health and preventative screening. It is a chance to share any concerns and updates on your health and to receive a thorough exam.  Visit your dentist for a routine exam and preventative care every 6 months. Brush your teeth twice a day and floss once a day. Good oral hygiene prevents tooth decay and gum disease.  The frequency of eye exams is based on your age, health, family medical history, use of contact lenses, and other factors. Follow your health care provider's recommendations for frequency of eye exams.  Eat a healthy diet. Foods such as vegetables, fruits, whole grains, low-fat dairy products, and lean protein foods contain the nutrients you need without too many calories. Decrease your intake of foods high in solid fats, added sugars, and salt. Eat the right amount of calories for you.Get information about a proper diet from your health care provider, if necessary.  Regular physical exercise is one of the most important things you can do for your health. Most adults should get at least 150 minutes of moderate-intensity exercise (any activity that increases your heart rate and causes you to sweat) each week. In addition, most adults need muscle-strengthening exercises on 2 or more days a week.  Maintain a healthy weight. The body mass index (BMI) is a screening tool to identify possible weight problems. It provides an estimate of body fat based on height and weight. Your health care provider can find your BMI and can help you achieve or maintain a healthy weight.For adults 20 years and older:  A BMI below 18.5 is considered underweight.  A BMI of 18.5 to 24.9 is  normal.  A BMI of 25 to 29.9 is considered overweight.  A BMI of 30 and above is considered obese.  Maintain normal blood lipids and cholesterol levels by exercising and minimizing your intake of saturated fat. Eat a balanced diet with plenty of fruit and vegetables. Blood tests for lipids and cholesterol should begin at age 48 and be repeated every 5 years. If your lipid or cholesterol levels are high, you are over 50, or you are at high risk for heart disease, you may need your cholesterol levels checked more frequently.Ongoing high lipid and cholesterol levels should be treated with medicines if diet and exercise are not working.  If you smoke, find out from your health care provider how to quit. If you do not use tobacco, do not start.  Lung cancer screening is recommended for adults aged 34-80 years who are at high risk for developing lung cancer because of a history of smoking. A yearly low-dose CT scan of the lungs is recommended for people who have at least a 30-pack-year history of smoking and are a current smoker or have quit within the past 15 years. A pack year of smoking is smoking an average of 1 pack of cigarettes a day for 1 year (for example: 1 pack a day for 30 years or 2 packs a day for 15 years). Yearly screening should continue until the smoker has stopped smoking for at least 15 years. Yearly screening should be stopped for people who develop a health problem that would prevent them from having lung cancer treatment.  If  you choose to drink alcohol, do not have more than 2 drinks per day. One drink is considered to be 12 ounces (355 mL) of beer, 5 ounces (148 mL) of wine, or 1.5 ounces (44 mL) of liquor.  Avoid use of street drugs. Do not share needles with anyone. Ask for help if you need support or instructions about stopping the use of drugs.  High blood pressure causes heart disease and increases the risk of stroke. Your blood pressure should be checked at least every 1-2  years. Ongoing high blood pressure should be treated with medicines, if weight loss and exercise are not effective.  If you are 44-77 years old, ask your health care provider if you should take aspirin to prevent heart disease.  Diabetes screening is done by taking a blood sample to check your blood glucose level after you have not eaten for a certain period of time (fasting). If you are not overweight and you do not have risk factors for diabetes, you should be screened once every 3 years starting at age 2. If you are overweight or obese and you are 70-59 years of age, you should be screened for diabetes every year as part of your cardiovascular risk assessment.  Colorectal cancer can be detected and often prevented. Most routine colorectal cancer screening begins at the age of 57 and continues through age 55. However, your health care provider may recommend screening at an earlier age if you have risk factors for colon cancer. On a yearly basis, your health care provider may provide home test kits to check for hidden blood in the stool. Use of a small camera at the end of a tube to directly examine the colon (sigmoidoscopy or colonoscopy) can detect the earliest forms of colorectal cancer. Talk to your health care provider about this at age 33, when routine screening begins. Direct exam of the colon should be repeated every 5-10 years through age 59, unless early forms of precancerous polyps or small growths are found.  People who are at an increased risk for hepatitis B should be screened for this virus. You are considered at high risk for hepatitis B if:  You were born in a country where hepatitis B occurs often. Talk with your health care provider about which countries are considered high risk.  Your parents were born in a high-risk country and you have not received a shot to protect against hepatitis B (hepatitis B vaccine).  You have HIV or AIDS.  You use needles to inject street  drugs.  You live with, or have sex with, someone who has hepatitis B.  You are a man who has sex with other men (MSM).  You get hemodialysis treatment.  You take certain medicines for conditions such as cancer, organ transplantation, and autoimmune conditions.  Hepatitis C blood testing is recommended for all people born from [birth year] through 1965 and any individual with known risks for hepatitis C.  Practice safe sex. Use condoms and avoid high-risk sexual practices to reduce the spread of sexually transmitted infections (STIs). STIs include gonorrhea, chlamydia, syphilis, trichomonas, herpes, HPV, and human immunodeficiency virus (HIV). Herpes, HIV, and HPV are viral illnesses that have no cure. They can result in disability, cancer, and death.  If you are a man who has sex with other men, you should be screened at least once per year for:  HIV.  Urethral, rectal, and pharyngeal infection of gonorrhea, chlamydia, or both.  If you are at risk of being infected  with HIV, it is recommended that you take a prescription medicine daily to prevent HIV infection. This is called preexposure prophylaxis (PrEP). You are considered at risk if:  You are a man who has sex with other men (MSM) and have other risk factors.  You are a heterosexual man, are sexually active, and are at increased risk for HIV infection.  You take drugs by injection.  You are sexually active with a partner who has HIV.  Talk with your health care provider about whether you are at high risk of being infected with HIV. If you choose to begin PrEP, you should first be tested for HIV. You should then be tested every 3 months for as long as you are taking PrEP.  A one-time screening for abdominal aortic aneurysm (AAA) and surgical repair of large AAAs by ultrasound are recommended for men ages 75 to 38 years who are current or former smokers.  Healthy men should no longer receive prostate-specific antigen (PSA) blood tests as  part of routine cancer screening. Talk with your health care provider about prostate cancer screening.  Testicular cancer screening is not recommended for adult males who have no symptoms. Screening includes self-exam, a health care provider exam, and other screening tests. Consult with your health care provider about any symptoms you have or any concerns you have about testicular cancer.  Use sunscreen. Apply sunscreen liberally and repeatedly throughout the day. You should seek shade when your shadow is shorter than you. Protect yourself by wearing long sleeves, pants, a wide-brimmed hat, and sunglasses year round, whenever you are outdoors.  Once a month, do a whole-body skin exam, using a mirror to look at the skin on your back. Tell your health care provider about new moles, moles that have irregular borders, moles that are larger than a pencil eraser, or moles that have changed in shape or color.  Stay current with required vaccines (immunizations).  Influenza vaccine. All adults should be immunized every year.  Tetanus, diphtheria, and acellular pertussis (Td, Tdap) vaccine. An adult who has not previously received Tdap or who does not know his vaccine status should receive 1 dose of Tdap. This initial dose should be followed by tetanus and diphtheria toxoids (Td) booster doses every 10 years. Adults with an unknown or incomplete history of completing a 3-dose immunization series with Td-containing vaccines should begin or complete a primary immunization series including a Tdap dose. Adults should receive a Td booster every 10 years.  Varicella vaccine. An adult without evidence of immunity to varicella should receive 2 doses or a second dose if he has previously received 1 dose.  Human papillomavirus (HPV) vaccine. Males aged 11-21 years who have not received the vaccine previously should receive the 3-dose series. Males aged 22-26 years may be immunized. Immunization is recommended through  the age of 27 years for any male who has sex with males and did not get any or all doses earlier. Immunization is recommended for any person with an immunocompromised condition through the age of 21 years if he did not get any or all doses earlier. During the 3-dose series, the second dose should be obtained 4-8 weeks after the first dose. The third dose should be obtained 24 weeks after the first dose and 16 weeks after the second dose.  Zoster vaccine. One dose is recommended for adults aged 34 years or older unless certain conditions are present.  Measles, mumps, and rubella (MMR) vaccine. Adults born before [birth year] generally are considered  immune to measles and mumps. Adults born in [birth year] or later should have 1 or more doses of MMR vaccine unless there is a contraindication to the vaccine or there is laboratory evidence of immunity to each of the three diseases. A routine second dose of MMR vaccine should be obtained at least 28 days after the first dose for students attending postsecondary schools, health care workers, or international travelers. People who received inactivated measles vaccine or an unknown type of measles vaccine during 1963-1967 should receive 2 doses of MMR vaccine. People who received inactivated mumps vaccine or an unknown type of mumps vaccine before 1979 and are at high risk for mumps infection should consider immunization with 2 doses of MMR vaccine. Unvaccinated health care workers born before [birth year] who lack laboratory evidence of measles, mumps, or rubella immunity or laboratory confirmation of disease should consider measles and mumps immunization with 2 doses of MMR vaccine or rubella immunization with 1 dose of MMR vaccine.  Pneumococcal 13-valent conjugate (PCV13) vaccine. When indicated, a person who is uncertain of his immunization history and has no record of immunization should receive the PCV13 vaccine. All adults 74 years of age and older should receive this vaccine. An  adult aged 12 years or older who has certain medical conditions and has not been previously immunized should receive 1 dose of PCV13 vaccine. This PCV13 should be followed with a dose of pneumococcal polysaccharide (PPSV23) vaccine. Adults who are at high risk for pneumococcal disease should obtain the PPSV23 vaccine at least 8 weeks after the dose of PCV13 vaccine. Adults older than 71 years of age who have normal immune system function should obtain the PPSV23 vaccine dose at least 1 year after the dose of PCV13 vaccine.  Pneumococcal polysaccharide (PPSV23) vaccine. When PCV13 is also indicated, PCV13 should be obtained first. All adults aged 37 years and older should be immunized. An adult younger than age 57 years who has certain medical conditions should be immunized. Any person who resides in a nursing home or long-term care facility should be immunized. An adult smoker should be immunized. People with an immunocompromised condition and certain other conditions should receive both PCV13 and PPSV23 vaccines. People with human immunodeficiency virus (HIV) infection should be immunized as soon as possible after diagnosis. Immunization during chemotherapy or radiation therapy should be avoided. Routine use of PPSV23 vaccine is not recommended for American Indians, [place] Natives, or people younger than 65 years unless there are medical conditions that require PPSV23 vaccine. When indicated, people who have unknown immunization and have no record of immunization should receive PPSV23 vaccine. One-time revaccination 5 years after the first dose of PPSV23 is recommended for people aged 19-64 years who have chronic kidney failure, nephrotic syndrome, asplenia, or immunocompromised conditions. People who received 1-2 doses of PPSV23 before age 49 years should receive another dose of PPSV23 vaccine at age 49 years or later if at least 5 years have passed since the previous dose. Doses of PPSV23 are not needed for  people immunized with PPSV23 at or after age 32 years.  Meningococcal vaccine. Adults with asplenia or persistent complement component deficiencies should receive 2 doses of quadrivalent meningococcal conjugate (MenACWY-D) vaccine. The doses should be obtained at least 2 months apart. Microbiologists working with certain meningococcal bacteria, [place] recruits, people at risk during an outbreak, and people who travel to or live in countries with a high rate of meningitis should be immunized. A first-year college student up through age 21 years who  is living in a residence hall should receive a dose if he did not receive a dose on or after his 16th birthday. Adults who have certain high-risk conditions should receive one or more doses of vaccine.  Hepatitis A vaccine. Adults who wish to be protected from this disease, have chronic liver disease, work with hepatitis A-infected animals, work in hepatitis A research labs, or travel to or work in countries with a high rate of hepatitis A should be immunized. Adults who were previously unvaccinated and who anticipate close contact with an international adoptee during the first 60 days after arrival in the [place] from a country with a high rate of hepatitis A should be immunized.  Hepatitis B vaccine. Adults should be immunized if they wish to be protected from this disease, are under age 62 years and have diabetes, have chronic liver disease, have had more than one sex partner in the past 6 months, may be exposed to blood or other infectious body fluids, are household contacts or sex partners of hepatitis B positive people, are clients or workers in certain care facilities, or travel to or work in countries with a high rate of hepatitis B.  Haemophilus influenzae type b (Hib) vaccine. A previously unvaccinated person with asplenia or sickle cell disease or having a scheduled splenectomy should receive 1 dose of Hib vaccine. Regardless of previous  immunization, a recipient of a hematopoietic stem cell transplant should receive a 3-dose series 6-12 months after his successful transplant. Hib vaccine is not recommended for adults with HIV infection. Preventive Service / Frequency Ages 17 to 71  Blood pressure check.** / Every 3-5 years.  Lipid and cholesterol check.** / Every 5 years beginning at age 60.  Hepatitis C blood test.** / For any individual with known risks for hepatitis C.  Skin self-exam. / Monthly.  Influenza vaccine. / Every year.  Tetanus, diphtheria, and acellular pertussis (Tdap, Td) vaccine.** / Consult your health care provider. 1 dose of Td every 10 years.  Varicella vaccine.** / Consult your health care provider.  HPV vaccine. / 3 doses over 6 months, if 48 or younger.  Measles, mumps, rubella (MMR) vaccine.** / You need at least 1 dose of MMR if you were born in 1957 or later. You may also need a second dose.  Pneumococcal 13-valent conjugate (PCV13) vaccine.** / Consult your health care provider.  Pneumococcal polysaccharide (PPSV23) vaccine.** / 1 to 2 doses if you smoke cigarettes or if you have certain conditions.  Meningococcal vaccine.** / 1 dose if you are age 28 to 75 years and a Market researcher living in a residence hall, or have one of several medical conditions. You may also need additional booster doses.  Hepatitis A vaccine.** / Consult your health care provider.  Hepatitis B vaccine.** / Consult your health care provider.  Haemophilus influenzae type b (Hib) vaccine.** / Consult your health care provider. Ages 60 to 72  Blood pressure check.** / Every year.  Lipid and cholesterol check.** / Every 5 years beginning at age 62.  Lung cancer screening. / Every year if you are aged 87-80 years and have a 30-pack-year history of smoking and currently smoke or have quit within the past 15 years. Yearly screening is stopped once you have quit smoking for at least 15 years or develop  a health problem that would prevent you from having lung cancer treatment.  Fecal occult blood test (FOBT) of stool. / Every year beginning at age 25 and continuing  until age 49. You may not have to do this test if you get a colonoscopy every 10 years.  Flexible sigmoidoscopy** or colonoscopy.** / Every 5 years for a flexible sigmoidoscopy or every 10 years for a colonoscopy beginning at age 76 and continuing until age 97.  Hepatitis C blood test.** / For all people born from [birth year] through 1965 and any individual with known risks for hepatitis C.  Skin self-exam. / Monthly.  Influenza vaccine. / Every year.  Tetanus, diphtheria, and acellular pertussis (Tdap/Td) vaccine.** / Consult your health care provider. 1 dose of Td every 10 years.  Varicella vaccine.** / Consult your health care provider.  Zoster vaccine.** / 1 dose for adults aged 68 years or older.  Measles, mumps, rubella (MMR) vaccine.** / You need at least 1 dose of MMR if you were born in 1957 or later. You may also need a second dose.  Pneumococcal 13-valent conjugate (PCV13) vaccine.** / Consult your health care provider.  Pneumococcal polysaccharide (PPSV23) vaccine.** / 1 to 2 doses if you smoke cigarettes or if you have certain conditions.  Meningococcal vaccine.** / Consult your health care provider.  Hepatitis A vaccine.** / Consult your health care provider.  Hepatitis B vaccine.** / Consult your health care provider.  Haemophilus influenzae type b (Hib) vaccine.** / Consult your health care provider. Ages 13 and over  Blood pressure check.** / Every year.  Lipid and cholesterol check.**/ Every 5 years beginning at age 42.  Lung cancer screening. / Every year if you are aged 104-80 years and have a 30-pack-year history of smoking and currently smoke or have quit within the past 15 years. Yearly screening is stopped once you have quit smoking for at least 15 years or develop a health problem that would prevent  you from having lung cancer treatment.  Fecal occult blood test (FOBT) of stool. / Every year beginning at age 56 and continuing until age 36. You may not have to do this test if you get a colonoscopy every 10 years.  Flexible sigmoidoscopy** or colonoscopy.** / Every 5 years for a flexible sigmoidoscopy or every 10 years for a colonoscopy beginning at age 75 and continuing until age 12.  Hepatitis C blood test.** / For all people born from [birth year] through 1966 and any individual with known risks for hepatitis C.  Abdominal aortic aneurysm (AAA) screening.** / A one-time screening for ages 43 to 46 years who are current or former smokers.  Skin self-exam. / Monthly.  Influenza vaccine. / Every year.  Tetanus, diphtheria, and acellular pertussis (Tdap/Td) vaccine.** / 1 dose of Td every 10 years.  Varicella vaccine.** / Consult your health care provider.  Zoster vaccine.** / 1 dose for adults aged 54 years or older.  Pneumococcal 13-valent conjugate (PCV13) vaccine.** / 1 dose for all adults aged 34 years and older.  Pneumococcal polysaccharide (PPSV23) vaccine.** / 1 dose for all adults aged 74 years and older.  Meningococcal vaccine.** / Consult your health care provider.  Hepatitis A vaccine.** / Consult your health care provider.  Hepatitis B vaccine.** / Consult your health care provider.  Haemophilus influenzae type b (Hib) vaccine.** / Consult your health care provider. **Family history and personal history of risk and conditions may change your health care provider's recommendations.   This information is not intended to replace advice given to you by your health care provider. Make sure you discuss any questions you have with your health care provider.   Document Released: 01/16/2002 Document Revised:  12/11/2014 Document Reviewed: 04/17/2011 Elsevier Interactive Patient Education [employer].

## 2016-09-28 NOTE — Progress Notes (Signed)
Pre visit review using our clinic review tool, if applicable. No additional management support is needed unless otherwise documented below in the visit note. 

## 2016-09-29 ENCOUNTER — Ambulatory Visit (INDEPENDENT_AMBULATORY_CARE_PROVIDER_SITE_OTHER): Payer: Medicare Other | Admitting: Family Medicine

## 2016-09-29 VITALS — BP 114/89 | HR 63

## 2016-09-29 DIAGNOSIS — I10 Essential (primary) hypertension: Secondary | ICD-10-CM | POA: Diagnosis not present

## 2016-09-29 LAB — URINE CULTURE

## 2016-09-29 NOTE — Progress Notes (Signed)
Pre visit review using our clinic review tool, if applicable. No additional management support is needed unless otherwise documented below in the visit note.  Pt presented to clinic for BP/pulse check. Verbal order received per Dr.Blyth. BP 114/89. HR 63bpm. Dr.Blyth notified.   Per Dr.Blyth: Continue taking hydrochlorothiazide. Stop taking Metoprolol and Cozaar until seen by cardiology and further advised.   Pt instructed and verbalized understanding.   RN blood pressure check note reviewed. Agree with documention and plan. Patient denies any difficulties at home. No trouble with ADLs, depression or falls. See EMR for functional status screen and depression screen. No recent changes to vision or hearing. Is UTD with immunizations. Is UTD with screening. Discussed Advanced Directives. Encouraged heart healthy diet, exercise as tolerated and adequate sleep. See patient's problem list for health risk factors to monitor. See AVS for preventative healthcare recommendation schedule.

## 2016-09-29 NOTE — Patient Instructions (Signed)
Per Dr.Blyth: Continue taking hydrochlorothiazide. Stop taking Metoprolol and Cozaar until seen by cardiology and further advised.

## 2016-10-01 ENCOUNTER — Encounter: Payer: Self-pay | Admitting: Family Medicine

## 2016-10-01 DIAGNOSIS — Z0000 Encounter for general adult medical examination without abnormal findings: Secondary | ICD-10-CM | POA: Insufficient documentation

## 2016-10-01 DIAGNOSIS — R001 Bradycardia, unspecified: Secondary | ICD-10-CM

## 2016-10-01 HISTORY — DX: Bradycardia, unspecified: R00.1

## 2016-10-01 NOTE — Assessment & Plan Note (Signed)
Well controlled, no changes to meds. Encouraged heart healthy diet such as the DASH diet and exercise as tolerated.  °

## 2016-10-01 NOTE — Progress Notes (Signed)
RN blood pressure check note reviewed. Agree with documention and plan. 

## 2016-10-01 NOTE — Assessment & Plan Note (Signed)
Severe but asymptomatic. Held betablocker for 24 hours and his pulse improved. Have arranged a cardiology consultation.

## 2016-10-01 NOTE — Assessment & Plan Note (Signed)
Patient encouraged to maintain heart healthy diet, regular exercise, adequate sleep. Consider daily probiotics. Take medications as prescribed. Given and reviewed copy of ACP documents from [place] Secretary of State and encouraged to complete and return 

## 2016-10-01 NOTE — Assessment & Plan Note (Signed)
resolved 

## 2016-10-01 NOTE — Assessment & Plan Note (Addendum)
Some noted polyuria as well but no dysuria. Is following with urology

## 2016-10-01 NOTE — Assessment & Plan Note (Signed)
hgba1c acceptable, minimize simple carbs. Increase exercise as tolerated. Continue current meds 

## 2016-10-02 LAB — VITAMIN D 25 HYDROXY (VIT D DEFICIENCY, FRACTURES): VITD: 39.05 ng/mL (ref 30.00–100.00)

## 2016-10-05 ENCOUNTER — Ambulatory Visit (INDEPENDENT_AMBULATORY_CARE_PROVIDER_SITE_OTHER): Payer: Medicare Other | Admitting: Cardiology

## 2016-10-05 ENCOUNTER — Encounter: Payer: Self-pay | Admitting: Cardiology

## 2016-10-05 DIAGNOSIS — R001 Bradycardia, unspecified: Secondary | ICD-10-CM

## 2016-10-05 DIAGNOSIS — E784 Other hyperlipidemia: Secondary | ICD-10-CM

## 2016-10-05 DIAGNOSIS — R9431 Abnormal electrocardiogram [ECG] [EKG]: Secondary | ICD-10-CM | POA: Diagnosis not present

## 2016-10-05 DIAGNOSIS — E7849 Other hyperlipidemia: Secondary | ICD-10-CM

## 2016-10-05 MED ORDER — METOPROLOL SUCCINATE ER 25 MG PO TB24: 25.0000 mg | ORAL_TABLET | Freq: Every day | ORAL | 5 refills | Status: DC

## 2016-10-05 NOTE — Assessment & Plan Note (Signed)
On statin- followed by PCP 

## 2016-10-05 NOTE — Assessment & Plan Note (Signed)
HR noted to be in the high 30"s by PCP. The pt was asymptomatic. His beta blocker was stopped.

## 2016-10-05 NOTE — Progress Notes (Signed)
10/05/2016 Daniel Gates   [birth year]  [id number]  Primary Physician Penni Homans, MD Primary Cardiologist: Dr Stanford Breed  HPI:  70 y/o AA male seen by Dr Stanford Breed in 2015 for an abnormal EKG. Stress echo at that time was normal, we have not seen him since. He recently saw his PCP and his HR was noted to be 40. The pt was asymptomatic. He denies any history of syncope, near syncope, palpitations, or exertional chest pain. His PCP held his Metoprolol 50 mg BID and he is seen today in the office for follow up. He continues to be symptom free. His HR is 78.    Current Outpatient Prescriptions  Medication Sig Dispense Refill  . aspirin 81 MG tablet Take 81 mg by mouth daily.      Marland Kitchen atorvastatin (LIPITOR) 20 MG tablet Take 1 tablet (20 mg total) by mouth daily. 30 tablet 5  . COD LIVER OIL PO Take by mouth.      . dicyclomine (BENTYL) 20 MG tablet Take 1 tablet (20 mg total) by mouth every 6 (six) hours. 120 tablet 0  . escitalopram (LEXAPRO) 10 MG tablet TAKE 1 TABLET BY MOUTH DAILY 30 tablet 6  . fenofibrate (TRICOR) 145 MG tablet TAKE 1 TABLET(130 MG) BY MOUTH DAILY BEFORE BREAKFAST 30 tablet 6  . furosemide (LASIX) 40 MG tablet Take 1 tablet (40 mg total) by mouth 2 (two) times daily as needed. Take 1 in the am and 1 in midafternoon for edema and BP as needed 60 tablet 11  . Garlic Oil 3 MG CAPS Take by mouth.      . hydrochlorothiazide (HYDRODIURIL) 25 MG tablet Take 1 tablet (25 mg total) by mouth daily. 30 tablet 6  . HYDROcodone-acetaminophen (NORCO) 10-325 MG tablet Take 1 tablet by mouth every 6 (six) hours as needed for moderate pain or severe pain. 120 tablet 0  . indomethacin (INDOCIN SR) 75 MG CR capsule Take 1 capsule (75 mg total) by mouth 3 (three) times daily as needed. 90 capsule 6  . latanoprost (XALATAN) 0.005 % ophthalmic solution Place 1 drop into both eyes at bedtime.    Marland Kitchen losartan (COZAAR) 100 MG tablet TAKE 1 TABLET BY MOUTH DAILY 90 tablet 0  . metFORMIN  (GLUCOPHAGE) 500 MG tablet TAKE 1 TABLET(500 MG) BY MOUTH TWICE DAILY WITH A MEAL 180 tablet 0  . metoprolol (LOPRESSOR) 50 MG tablet TAKE 1 TABLET(50 MG) BY MOUTH TWICE DAILY 60 tablet 5  . Multiple Vitamins-Minerals (ONE-A-DAY MENS HEALTH FORMULA) TABS Take 1 tablet by mouth daily.    . ONE TOUCH ULTRA TEST test strip USE TO TEST BLOOD SUGAR TWICE DAILY AS DIRECTED 30 each 5  . ONETOUCH DELICA LANCETS 99991111 MISC USE TO TEST BLOOD SUGAR TWICE DAILY AS DIRECTED 100 each 6  . promethazine (PHENERGAN) 25 MG tablet TAKE ONE (1) TABLET(S) EVERY FOUR (4) HOURS AS NEEDED FOR NAUSEA 60 tablet 0  . rOPINIRole (REQUIP) 1 MG tablet TAKE 1 TO 2 TABLETS BY MOUTH AT BEDTIME 60 tablet 0  . tamsulosin (FLOMAX) 0.4 MG CAPS capsule TAKE ONE CAPSULE BY MOUTH DAILY AFTER SUPPER 30 capsule 11  . metoprolol succinate (TOPROL XL) 25 MG 24 hr tablet Take 1 tablet (25 mg total) by mouth daily. 30 tablet 5   No current facility-administered medications for this visit.     No Known Allergies  Social History   Social History  . Marital status: Widowed    Spouse name: N/A  .  Number of children: 7  . Years of education: N/A   Occupational History  .  Disabled   Social History Main Topics  . Smoking status: Never Smoker  . Smokeless tobacco: Never Used  . Alcohol use No  . Drug use: No  . Sexual activity: Not Currently   Other Topics Concern  . Not on file   Social History Narrative  . No narrative on file     Review of Systems: General: negative for chills, fever, night sweats or weight changes.  Cardiovascular: negative for chest pain, dyspnea on exertion, edema, orthopnea, palpitations, paroxysmal nocturnal dyspnea or shortness of breath Dermatological: negative for rash Respiratory: negative for cough or wheezing Urologic: negative for hematuria Abdominal: negative for nausea, vomiting, diarrhea, bright red blood per rectum, melena, or hematemesis Neurologic: negative for visual changes, syncope,  or dizziness All other systems reviewed and are otherwise negative except as noted above.    Blood pressure (!) 136/92, pulse 70, height 6' (1.829 m), weight 208 lb (94.3 kg).  General appearance: alert, cooperative and no distress Neck: no carotid bruit and no JVD Lungs: clear to auscultation bilaterally Heart: regular rate and rhythm Extremities: extremities normal, atraumatic, no cyanosis or edema Skin: Skin color, texture, turgor normal. No rashes or lesions Neurologic: Grossly normal   ASSESSMENT AND PLAN:   Bradycardia HR noted to be in the high 30"s by PCP. The pt was asymptomatic. His beta blocker was stopped.   Abnormal EKG Normal stress echo 2015 No history of CAD or angina  Non-insulin treated type 2 diabetes mellitus ([place]) Followed by PCP  Hyperlipidemia On statin followed by PCP   PLAN  I suggested we resume beta blocker at low dose. He seemed a little unsure of his current medications. For compliance reasons I suggested we try Toprol 25 mg daily. He'll return for an EKG and VS with an RN in two weeks.  Kerin Ransom PA-C 10/05/2016 4:03 PM

## 2016-10-05 NOTE — Assessment & Plan Note (Signed)
Normal stress echo 2015 No history of CAD or angina

## 2016-10-05 NOTE — Assessment & Plan Note (Signed)
Followed by PCP

## 2016-10-05 NOTE — Patient Instructions (Signed)
Medication Instructions:  START METOPROLOL 25MG  DAILY  Labwork: NONE  Follow-Up: Your physician recommends that you schedule a follow-up appointment in: 2 [place] BLOOD PRESSURE CHECK, EKG AND MEDICATION CHECK (PLEASE UPDATE EPIC LIST)    If you need a refill on your cardiac medications before your next appointment, please call your pharmacy.

## 2016-10-06 ENCOUNTER — Other Ambulatory Visit: Payer: Self-pay | Admitting: Family Medicine

## 2016-10-13 ENCOUNTER — Other Ambulatory Visit: Payer: Self-pay | Admitting: Family Medicine

## 2016-10-19 ENCOUNTER — Ambulatory Visit (INDEPENDENT_AMBULATORY_CARE_PROVIDER_SITE_OTHER): Payer: Medicare Other | Admitting: *Deleted

## 2016-10-19 DIAGNOSIS — Z79899 Other long term (current) drug therapy: Secondary | ICD-10-CM | POA: Insufficient documentation

## 2016-10-19 NOTE — Progress Notes (Signed)
Received note from cardiology. They saw some bradycardia but did not change meds. Please have him come in in next 2-4 weeks for med check, BP check with RNs or me to see what his pulse and bp continue to be and he should let us know if he has any concerning symptoms

## 2016-10-19 NOTE — Progress Notes (Signed)
Brought patient in for nurse visit, EKG check and BP check.  This was a follow up visit - patient seen by Lurena Joiner 2 weeks ago and metoprolol was added to regimen - He also needed to confirm medications as he is taking. Med list updated, as patient brought his medications in with him & confirmed his current dosages.

## 2016-10-19 NOTE — Progress Notes (Signed)
Pt seen previously for asymptomatic bradycardia on Lopressor 50 mg BID. I saw him in the office and his HR was 78 of Lopressor. I suggested we try Toprol XL 25 QD as I felt a once a day Rx would be easier for him to manage. Today he came back for an EKG which I reviewed- NSR 46, asymptomatic on Toprol XL 25 mg. I have reviewed his chart further and I'm not sure why he needs a beta blocker at all. He does have HTN but no documented CAD or arrhythmia. We saw him once for an abnormal EKG in May 2015. He had a negative stress echo then. He may do better on a different anti- hypertensive but will defer to Dr Randel Pigg.  Kerin Ransom PA-C 10/19/2016 5:05 PM

## 2016-10-19 NOTE — Progress Notes (Incomplete)
Pt seen previously for asymptomatic bradycardia on Lopressor 50 mg BID. I saw him in the office and his HR was 78. I suggested we try Toprol XL 25 QD as I felt a once a day Rx would be easier for him to manage. Today he came back for an EKG which I reviewed- NSR 46, asymptomatic. I have reviewed his chart further and I'm not sure why he needs a beta blocker at all. He does have HTN but no documented CAD or arrhythmia. He may do better on another anti hypertensive but will defer to Dr Randel Pigg.

## 2016-10-19 NOTE — Patient Instructions (Signed)
Continue your current medications as you have been taking them. There are no suggested changes today. Please obtain a home blood pressure cuff & continue to monitor your blood pressure from home, if you are able. If you need help with learning to use this type of equipment, we can arrange a visit with one of our pharmacists in the office. If you have any further needs, please feel free to call.

## 2016-10-23 ENCOUNTER — Other Ambulatory Visit: Payer: Self-pay | Admitting: Family Medicine

## 2016-10-30 ENCOUNTER — Other Ambulatory Visit: Payer: Self-pay | Admitting: Family Medicine

## 2016-11-10 ENCOUNTER — Other Ambulatory Visit: Payer: Self-pay | Admitting: Family Medicine

## 2016-11-13 ENCOUNTER — Other Ambulatory Visit: Payer: Self-pay | Admitting: Family Medicine

## 2016-11-13 DIAGNOSIS — M109 Gout, unspecified: Secondary | ICD-10-CM

## 2016-11-13 DIAGNOSIS — R972 Elevated prostate specific antigen [PSA]: Secondary | ICD-10-CM

## 2016-11-13 DIAGNOSIS — G2581 Restless legs syndrome: Secondary | ICD-10-CM

## 2016-11-13 DIAGNOSIS — D649 Anemia, unspecified: Secondary | ICD-10-CM

## 2016-11-13 DIAGNOSIS — E559 Vitamin D deficiency, unspecified: Secondary | ICD-10-CM

## 2016-11-13 DIAGNOSIS — N529 Male erectile dysfunction, unspecified: Secondary | ICD-10-CM

## 2016-11-13 DIAGNOSIS — E118 Type 2 diabetes mellitus with unspecified complications: Secondary | ICD-10-CM

## 2016-11-20 ENCOUNTER — Telehealth: Payer: Self-pay | Admitting: Family Medicine

## 2016-11-20 NOTE — Telephone Encounter (Signed)
Numbness and coldness in left arm is likely neurologic if it is really bothering him then he needs to see a neurologist for further evaluation

## 2016-11-20 NOTE — Telephone Encounter (Signed)
Caller name: Relationship to patient: Self Can be reached: [phone] Pharmacy:  Reason for call: Patient request call back to discuss numbness and coldness in left arm. States he has spoken to provider about it and needs advise on what to do.

## 2016-12-05 ENCOUNTER — Other Ambulatory Visit: Payer: Self-pay | Admitting: Family Medicine

## 2016-12-05 DIAGNOSIS — G2581 Restless legs syndrome: Secondary | ICD-10-CM

## 2016-12-05 DIAGNOSIS — N529 Male erectile dysfunction, unspecified: Secondary | ICD-10-CM

## 2016-12-05 DIAGNOSIS — D649 Anemia, unspecified: Secondary | ICD-10-CM

## 2016-12-05 DIAGNOSIS — E118 Type 2 diabetes mellitus with unspecified complications: Secondary | ICD-10-CM

## 2016-12-05 DIAGNOSIS — R972 Elevated prostate specific antigen [PSA]: Secondary | ICD-10-CM

## 2016-12-05 DIAGNOSIS — M109 Gout, unspecified: Secondary | ICD-10-CM

## 2016-12-05 DIAGNOSIS — E559 Vitamin D deficiency, unspecified: Secondary | ICD-10-CM

## 2016-12-08 ENCOUNTER — Other Ambulatory Visit: Payer: Self-pay | Admitting: Family Medicine

## 2016-12-29 ENCOUNTER — Encounter: Payer: Self-pay | Admitting: Family Medicine

## 2016-12-29 ENCOUNTER — Ambulatory Visit (INDEPENDENT_AMBULATORY_CARE_PROVIDER_SITE_OTHER): Payer: Medicare Other | Admitting: Family Medicine

## 2016-12-29 ENCOUNTER — Ambulatory Visit (HOSPITAL_BASED_OUTPATIENT_CLINIC_OR_DEPARTMENT_OTHER)
Admission: RE | Admit: 2016-12-29 | Discharge: 2016-12-29 | Disposition: A | Discharge status: Home / Self Care | Payer: Medicare Other | Source: Ambulatory Visit | Attending: Family Medicine | Admitting: Family Medicine

## 2016-12-29 VITALS — BP 130/74 | HR 63 | Temp 97.6°F | Wt 217.6 lb

## 2016-12-29 DIAGNOSIS — M542 Cervicalgia: Secondary | ICD-10-CM

## 2016-12-29 DIAGNOSIS — R350 Frequency of micturition: Secondary | ICD-10-CM | POA: Diagnosis not present

## 2016-12-29 DIAGNOSIS — I10 Essential (primary) hypertension: Secondary | ICD-10-CM

## 2016-12-29 DIAGNOSIS — H269 Unspecified cataract: Secondary | ICD-10-CM | POA: Insufficient documentation

## 2016-12-29 DIAGNOSIS — D649 Anemia, unspecified: Secondary | ICD-10-CM

## 2016-12-29 DIAGNOSIS — E784 Other hyperlipidemia: Secondary | ICD-10-CM | POA: Diagnosis not present

## 2016-12-29 DIAGNOSIS — E038 Other specified hypothyroidism: Secondary | ICD-10-CM | POA: Diagnosis not present

## 2016-12-29 DIAGNOSIS — E559 Vitamin D deficiency, unspecified: Secondary | ICD-10-CM

## 2016-12-29 DIAGNOSIS — E7849 Other hyperlipidemia: Secondary | ICD-10-CM

## 2016-12-29 DIAGNOSIS — M1A0710 Idiopathic chronic gout, right ankle and foot, without tophus (tophi): Secondary | ICD-10-CM

## 2016-12-29 DIAGNOSIS — H4010X0 Unspecified open-angle glaucoma, stage unspecified: Secondary | ICD-10-CM

## 2016-12-29 HISTORY — DX: Unspecified open-angle glaucoma, stage unspecified: H40.10X0

## 2016-12-29 HISTORY — DX: Unspecified cataract: H26.9

## 2016-12-29 LAB — CBC
HCT: 40 % (ref 39.0–52.0)
Hemoglobin: 13.1 g/dL (ref 13.0–17.0)
MCHC: 32.7 g/dL (ref 30.0–36.0)
MCV: 76.9 fl — ABNORMAL LOW (ref 78.0–100.0)
PLATELETS: 227 10*3/uL (ref 150.0–400.0)
RBC: 5.2 Mil/uL (ref 4.22–5.81)
RDW: 16.6 % — ABNORMAL HIGH (ref 11.5–15.5)
WBC: 5.7 10*3/uL (ref 4.0–10.5)

## 2016-12-29 LAB — COMPREHENSIVE METABOLIC PANEL
ALBUMIN: 3.9 g/dL (ref 3.5–5.2)
ALT: 18 U/L (ref 0–53)
AST: 19 U/L (ref 0–37)
Alkaline Phosphatase: 57 U/L (ref 39–117)
BILIRUBIN TOTAL: 0.2 mg/dL (ref 0.2–1.2)
BUN: 25 mg/dL — AB (ref 6–23)
CALCIUM: 9.5 mg/dL (ref 8.4–10.5)
CHLORIDE: 106 meq/L (ref 96–112)
CO2: 25 meq/L (ref 19–32)
CREATININE: 1.91 mg/dL — AB (ref 0.40–1.50)
GFR: 44.77 mL/min — ABNORMAL LOW (ref >60.00)
Glucose, Bld: 102 mg/dL — ABNORMAL HIGH (ref 70–99)
Potassium: 4.1 mEq/L (ref 3.5–5.1)
SODIUM: 139 meq/L (ref 135–145)
Total Protein: 7.8 g/dL (ref 6.0–8.3)

## 2016-12-29 LAB — LIPID PANEL
CHOLESTEROL: 159 mg/dL (ref 0–200)
HDL: 34.7 mg/dL — ABNORMAL LOW (ref >39.00)
NonHDL: 124.31
Total CHOL/HDL Ratio: 5
Triglycerides: 273 mg/dL — ABNORMAL HIGH (ref 0.0–149.0)
VLDL: 54.6 mg/dL — ABNORMAL HIGH (ref 0.0–40.0)

## 2016-12-29 LAB — URINALYSIS
Bilirubin Urine: NEGATIVE
HGB URINE DIPSTICK: NEGATIVE
KETONES UR: NEGATIVE
LEUKOCYTES UA: NEGATIVE
Nitrite: NEGATIVE
SPECIFIC GRAVITY, URINE: 1.01 (ref 1.000–1.030)
Total Protein, Urine: NEGATIVE
URINE GLUCOSE: NEGATIVE
UROBILINOGEN UA: 0.2 (ref 0.0–1.0)
pH: 6 (ref 5.0–8.0)

## 2016-12-29 LAB — TSH: TSH: 3.17 u[IU]/mL (ref 0.35–4.50)

## 2016-12-29 LAB — LDL CHOLESTEROL, DIRECT: LDL DIRECT: 84 mg/dL

## 2016-12-29 NOTE — Assessment & Plan Note (Signed)
Well controlled, no changes to meds. Encouraged heart healthy diet such as the DASH diet and exercise as tolerated.  °

## 2016-12-29 NOTE — Assessment & Plan Note (Signed)
Encouraged daily supplements and check level today

## 2016-12-29 NOTE — Progress Notes (Signed)
Pre visit review using our clinic review tool, if applicable. No additional management support is needed unless otherwise documented below in the visit note. 

## 2016-12-29 NOTE — Assessment & Plan Note (Signed)
On Levothyroxine, continue to monitor 

## 2016-12-29 NOTE — Progress Notes (Signed)
Patient ID: Daniel Gates, male   DOB: 06/29/1946, 71 y.o.   MRN: [id number]

## 2016-12-29 NOTE — Assessment & Plan Note (Signed)
Encouraged heart healthy diet, increase exercise, avoid trans fats, consider a krill oil cap daily 

## 2016-12-29 NOTE — Patient Instructions (Signed)
Carbohydrate Counting for Diabetes Mellitus, Adult Carbohydrate counting is a method for keeping track of how many carbohydrates you eat. Eating carbohydrates naturally increases the amount of sugar (glucose) in the blood. Counting how many carbohydrates you eat helps keep your blood glucose within normal limits, which helps you manage your diabetes (diabetes mellitus). It is important to know how many carbohydrates you can safely have in each meal. This is different for every person. A diet and nutrition specialist (registered dietitian) can help you make a meal plan and calculate how many carbohydrates you should have at each meal and snack. Carbohydrates are found in the following foods:  Grains, such as breads and cereals.  Dried beans and soy products.  Starchy vegetables, such as potatoes, peas, and corn.  Fruit and fruit juices.  Milk and yogurt.  Sweets and snack foods, such as cake, cookies, candy, chips, and soft drinks. How do I count carbohydrates? There are two ways to count carbohydrates in food. You can use either of the methods or a combination of both. Reading "Nutrition Facts" on packaged food  The "Nutrition Facts" list is included on the labels of almost all packaged foods and beverages in the [place] It includes:  The serving size.  Information about nutrients in each serving, including the grams (g) of carbohydrate per serving. To use the "Nutrition Facts":  Decide how many servings you will have.  Multiply the number of servings by the number of carbohydrates per serving.  The resulting number is the total amount of carbohydrates that you will be having. Learning standard serving sizes of other foods  When you eat foods containing carbohydrates that are not packaged or do not include "Nutrition Facts" on the label, you need to measure the servings in order to count the amount of carbohydrates:  Measure the foods that you will eat with a food scale or measuring  cup, if needed.  Decide how many standard-size servings you will eat.  Multiply the number of servings by 15. Most carbohydrate-rich foods have about 15 g of carbohydrates per serving.  For example, if you eat 8 oz (170 g) of strawberries, you will have eaten 2 servings and 30 g of carbohydrates (2 servings x 15 g = 30 g).  For foods that have more than one food mixed, such as soups and casseroles, you must count the carbohydrates in each food that is included. The following list contains standard serving sizes of common carbohydrate-rich foods. Each of these servings has about 15 g of carbohydrates:   hamburger bun or  English muffin.   oz (15 mL) syrup.   oz (14 g) jelly.  1 slice of bread.  1 six-inch tortilla.  3 oz (85 g) cooked rice or pasta.  4 oz (113 g) cooked dried beans.  4 oz (113 g) starchy vegetable, such as peas, corn, or potatoes.  4 oz (113 g) hot cereal.  4 oz (113 g) mashed potatoes or  of a large baked potato.  4 oz (113 g) canned or frozen fruit.  4 oz (120 mL) fruit juice.  4-6 crackers.  6 chicken nuggets.  6 oz (170 g) unsweetened dry cereal.  6 oz (170 g) plain fat-free yogurt or yogurt sweetened with artificial sweeteners.  8 oz (240 mL) milk.  8 oz (170 g) fresh fruit or one small piece of fruit.  24 oz (680 g) popped popcorn. Example of carbohydrate counting Sample meal  3 oz (85 g) chicken breast.  6 oz (  170 g) brown rice.  4 oz (113 g) corn.  8 oz (240 mL) milk.  8 oz (170 g) strawberries with sugar-free whipped topping. Carbohydrate calculation 1. Identify the foods that contain carbohydrates:  Rice.  Corn.  Milk.  Strawberries. 2. Calculate how many servings you have of each food:  2 servings rice.  1 serving corn.  1 serving milk.  1 serving strawberries. 3. Multiply each number of servings by 15 g:  2 servings rice x 15 g = 30 g.  1 serving corn x 15 g = 15 g.  1 serving milk x 15 g = 15  g.  1 serving strawberries x 15 g = 15 g. 4. Add together all of the amounts to find the total grams of carbohydrates eaten:  30 g + 15 g + 15 g + 15 g = 75 g of carbohydrates total. This information is not intended to replace advice given to you by your health care provider. Make sure you discuss any questions you have with your health care provider. Document Released: 11/20/2005 Document Revised: 06/09/2016 Document Reviewed: 05/03/2016 Elsevier Interactive Patient Education  2017 Elsevier Inc.  

## 2016-12-29 NOTE — Progress Notes (Signed)
Subjective:    Patient ID: Daniel Gates, male    DOB: August 10, 1946, 71 y.o.   MRN: [id number]  Chief Complaint  Patient presents with  . Follow-up    HTN. 29-month F/U.    HPI Patient is in today for a follow up on hypertension. Patient also has complaints of ongoing numbness and tingling in left arm. States that this has been going on for the past 3 months. No additional concerns noted. He is noting some urinary frequency recently. No dysuria, flank pain or hematuria. He endorses some recent flare in neck pain and tingling in left arm. He denies any falls or trauma. Denies CP/palp/SOB/HA/congestion/fevers/GI c/o. Taking meds as prescribed  I acted as a Education administrator for Dr. Charlett Blake. [name], [place]   Past Medical History:  Diagnosis Date  . Allergic rhinitis   . Bradycardia 10/01/2016  . Cancer ([place])    colon; diagnosed 2003  . Cataract 12/29/2016  . Depression   . Diabetes type 2, controlled ([place]) 06/27/2012  . Glaucoma   . Gout   . Hyperlipidemia   . Hypertension   . Medicare annual wellness visit, subsequent 02/15/2014  . Open-angle glaucoma 12/29/2016  . TMJ arthralgia 01/02/2016    Past Surgical History:  Procedure Laterality Date  . COLON SURGERY  2003   for colon cancer  . COLONOSCOPY    . INGUINAL HERNIA REPAIR  1997   right    Family History  Problem Relation Age of Onset  . Diabetes Mother   . Heart disease Mother     MI  . Cancer Father     prostate  . Colon cancer Neg Hx   . Stomach cancer Neg Hx   . Esophageal cancer Neg Hx   . Rectal cancer Neg Hx     Social History   Social History  . Marital status: Widowed    Spouse name: N/A  . Number of children: 7  . Years of education: N/A   Occupational History  .  Disabled   Social History Main Topics  . Smoking status: Never Smoker  . Smokeless tobacco: Never Used  . Alcohol use No  . Drug use: No  . Sexual activity: Not Currently   Other Topics Concern  . Not on file   Social History Narrative    . No narrative on file    Outpatient Medications Prior to Visit  Medication Sig Dispense Refill  . aspirin 325 MG tablet Take 325 mg by mouth daily.     Marland Kitchen atorvastatin (LIPITOR) 20 MG tablet Take 1 tablet (20 mg total) by mouth daily. 30 tablet 5  . COD LIVER OIL PO Take 1 tablet by mouth daily.     Marland Kitchen dicyclomine (BENTYL) 20 MG tablet Take 1 tablet (20 mg total) by mouth every 6 (six) hours. (Patient taking differently: Take 20 mg by mouth 2 (two) times daily. ) 120 tablet 0  . escitalopram (LEXAPRO) 10 MG tablet TAKE 1 TABLET BY MOUTH DAILY 30 tablet 4  . fenofibrate (TRICOR) 145 MG tablet TAKE 1 TABLET(130 MG) BY MOUTH DAILY BEFORE BREAKFAST 30 tablet 6  . furosemide (LASIX) 40 MG tablet Take 1 tablet (40 mg total) by mouth 2 (two) times daily as needed. Take 1 in the am and 1 in midafternoon for edema and BP as needed 60 tablet 11  . Garlic Oil 123XX123 MG CAPS Take 1 capsule by mouth daily.     . hydrochlorothiazide (HYDRODIURIL) 25 MG tablet Take 1 tablet (  25 mg total) by mouth daily. 30 tablet 6  . hydrochlorothiazide (HYDRODIURIL) 25 MG tablet TAKE 1 TABLET(25 MG) BY MOUTH DAILY 30 tablet 3  . HYDROcodone-acetaminophen (NORCO) 10-325 MG tablet Take 1 tablet by mouth every 6 (six) hours as needed for moderate pain or severe pain. 120 tablet 0  . indomethacin (INDOCIN SR) 75 MG CR capsule Take 1 capsule (75 mg total) by mouth 3 (three) times daily as needed. 90 capsule 6  . latanoprost (XALATAN) 0.005 % ophthalmic solution Place 1 drop into both eyes at bedtime.    Marland Kitchen losartan (COZAAR) 100 MG tablet TAKE 1 TABLET BY MOUTH DAILY 90 tablet 0  . metFORMIN (GLUCOPHAGE) 500 MG tablet TAKE 1 TABLET(500 MG) BY MOUTH TWICE DAILY WITH A MEAL 180 tablet 0  . metoprolol (LOPRESSOR) 50 MG tablet TAKE 1 TABLET(50 MG) BY MOUTH TWICE DAILY 60 tablet 6  . metoprolol succinate (TOPROL XL) 25 MG 24 hr tablet Take 1 tablet (25 mg total) by mouth daily. 30 tablet 5  . Multiple Vitamins-Minerals (ONE-A-DAY MENS  HEALTH FORMULA) TABS Take 1 tablet by mouth daily.    . ONE TOUCH ULTRA TEST test strip USE AS DIRECTED TO TEST BLOOD SUGAR TWICE DAILY AS DIRECTED 100 each 5  . ONETOUCH DELICA LANCETS 99991111 MISC USE TO TEST BLOOD SUGAR TWICE DAILY AS DIRECTED 100 each 6  . promethazine (PHENERGAN) 25 MG tablet TAKE ONE (1) TABLET(S) EVERY FOUR (4) HOURS AS NEEDED FOR NAUSEA 60 tablet 0  . rOPINIRole (REQUIP) 1 MG tablet TAKE 1 TO 2 TABLETS BY MOUTH AT BEDTIME 60 tablet 0  . tamsulosin (FLOMAX) 0.4 MG CAPS capsule TAKE 1 CAPSULE BY MOUTH DAILY AFTER DINNER 30 capsule 5   No facility-administered medications prior to visit.     No Known Allergies  Review of Systems  Constitutional: Negative for fever and malaise/fatigue.  HENT: Negative for congestion.   Eyes: Negative for blurred vision.  Respiratory: Negative for cough and shortness of breath.   Cardiovascular: Negative for chest pain, palpitations and leg swelling.  Gastrointestinal: Negative for vomiting.  Genitourinary: Positive for frequency. Negative for dysuria, flank pain and hematuria.  Musculoskeletal: Positive for neck pain. Negative for back pain.  Skin: Negative for rash.  Neurological: Positive for tingling. Negative for loss of consciousness and headaches.       Numbness in left arm.       Objective:    Physical Exam  Constitutional: He is oriented to person, place, and time. He appears well-developed and well-nourished. No distress.  HENT:  Head: Normocephalic and atraumatic.  Eyes: Conjunctivae are normal.  Neck: Normal range of motion. No thyromegaly present.  Cardiovascular: Normal rate and regular rhythm.   Pulmonary/Chest: Effort normal and breath sounds normal. He has no wheezes.  Abdominal: Soft. Bowel sounds are normal. He exhibits no mass. There is no tenderness. There is no rebound and no guarding.  Musculoskeletal: He exhibits no edema or deformity.  Neurological: He is alert and oriented to person, place, and time.    Skin: Skin is warm and dry. He is not diaphoretic.  Psychiatric: He has a normal mood and affect.    BP 130/74 (BP Location: Left Arm, Patient Position: Sitting, Cuff Size: Large)   Pulse 63   Temp 97.6 F (36.4 C) (Oral)   Wt 217 lb 9.6 oz (98.7 kg)   SpO2 96% Comment: RA  BMI 29.51 kg/m  Wt Readings from Last 3 Encounters:  12/29/16 217 lb 9.6 oz (98.7  kg)  10/05/16 208 lb (94.3 kg)  09/28/16 209 lb (94.8 kg)     Lab Results  Component Value Date   WBC 5.7 12/29/2016   HGB 13.1 12/29/2016   HCT 40.0 12/29/2016   PLT 227.0 12/29/2016   GLUCOSE 102 (H) 12/29/2016   CHOL 159 12/29/2016   TRIG 273.0 (H) 12/29/2016   HDL 34.70 (L) 12/29/2016   LDLDIRECT 84.0 12/29/2016   LDLCALC NOT CALC 12/24/2015   ALT 18 12/29/2016   AST 19 12/29/2016   NA 139 12/29/2016   K 4.1 12/29/2016   CL 106 12/29/2016   CREATININE 1.91 (H) 12/29/2016   BUN 25 (H) 12/29/2016   CO2 25 12/29/2016   TSH 3.17 12/29/2016   PSA 7.46 (H) 09/28/2016   HGBA1C 6.1 06/23/2016   MICROALBUR 1.7 10/15/2015    Lab Results  Component Value Date   TSH 3.17 12/29/2016   Lab Results  Component Value Date   WBC 5.7 12/29/2016   HGB 13.1 12/29/2016   HCT 40.0 12/29/2016   MCV 76.9 (L) 12/29/2016   PLT 227.0 12/29/2016   Lab Results  Component Value Date   NA 139 12/29/2016   K 4.1 12/29/2016   CO2 25 12/29/2016   GLUCOSE 102 (H) 12/29/2016   BUN 25 (H) 12/29/2016   CREATININE 1.91 (H) 12/29/2016   BILITOT 0.2 12/29/2016   ALKPHOS 57 12/29/2016   AST 19 12/29/2016   ALT 18 12/29/2016   PROT 7.8 12/29/2016   ALBUMIN 3.9 12/29/2016   CALCIUM 9.5 12/29/2016   GFR 44.77 (L) 12/29/2016   Lab Results  Component Value Date   CHOL 159 12/29/2016   Lab Results  Component Value Date   HDL 34.70 (L) 12/29/2016   Lab Results  Component Value Date   LDLCALC NOT CALC 12/24/2015   Lab Results  Component Value Date   TRIG 273.0 (H) 12/29/2016   Lab Results  Component Value Date   CHOLHDL  5 12/29/2016   Lab Results  Component Value Date   HGBA1C 6.1 06/23/2016       Assessment & Plan:   Problem List Items Addressed This Visit    Hypothyroidism    On Levothyroxine, continue to monitor      Relevant Orders   CBC (Completed)   Comprehensive metabolic panel (Completed)   TSH (Completed)   Vitamin D deficiency    Encouraged daily supplements and check level today      Hyperlipidemia    Encouraged heart healthy diet, increase exercise, avoid trans fats, consider a krill oil cap daily      Relevant Orders   Lipid panel (Completed)   Anemia   Essential hypertension    Well controlled, no changes to meds. Encouraged heart healthy diet such as the DASH diet and exercise as tolerated.       Urinary frequency - Primary    Is following with [facility] and he denies any dysuria. No concerns seen on lab work      Relevant Orders   Urinalysis (Completed)   Gout    No flares and uric acid wnl      Open-angle glaucoma   Cataract   Neck pain    With some  Numbness intermittently in LUE at times. He declines referral but will try some topical treatments. If it worsens he agrees to contact us for further referral      Relevant Orders   DG Neck Soft Tissue (Completed)      I  am having Mr. Magnon maintain his aspirin, Garlic Oil, COD LIVER OIL PO, promethazine, indomethacin, ONE-A-DAY MENS HEALTH FORMULA, furosemide, losartan, HYDROcodone-acetaminophen, ONETOUCH DELICA LANCETS 99991111, dicyclomine, atorvastatin, hydrochlorothiazide, fenofibrate, latanoprost, metoprolol succinate, metoprolol, hydrochlorothiazide, rOPINIRole, ONE TOUCH ULTRA TEST, tamsulosin, metFORMIN, and escitalopram.  No orders of the defined types were placed in this encounter.   CMA served as Education administrator during this visit. History, Physical and Plan performed by medical provider. Documentation and orders reviewed and attested to.  Penni Homans, MD

## 2016-12-29 NOTE — Assessment & Plan Note (Signed)
No flares and uric acid wnl

## 2016-12-31 DIAGNOSIS — M542 Cervicalgia: Secondary | ICD-10-CM | POA: Insufficient documentation

## 2016-12-31 NOTE — Assessment & Plan Note (Signed)
Is following with [facility] and he denies any dysuria. No concerns seen on lab work

## 2016-12-31 NOTE — Assessment & Plan Note (Signed)
With some  Numbness intermittently in LUE at times. He declines referral but will try some topical treatments. If it worsens he agrees to contact us for further referral

## 2017-01-01 ENCOUNTER — Other Ambulatory Visit: Payer: Self-pay | Admitting: Family Medicine

## 2017-01-15 ENCOUNTER — Other Ambulatory Visit: Payer: Self-pay | Admitting: Family Medicine

## 2017-01-30 ENCOUNTER — Other Ambulatory Visit: Payer: Self-pay | Admitting: Family Medicine

## 2017-02-13 ENCOUNTER — Other Ambulatory Visit: Payer: Self-pay | Admitting: Family Medicine

## 2017-03-04 ENCOUNTER — Other Ambulatory Visit: Payer: Self-pay | Admitting: Family Medicine

## 2017-03-04 DIAGNOSIS — E559 Vitamin D deficiency, unspecified: Secondary | ICD-10-CM

## 2017-03-04 DIAGNOSIS — N529 Male erectile dysfunction, unspecified: Secondary | ICD-10-CM

## 2017-03-04 DIAGNOSIS — D649 Anemia, unspecified: Secondary | ICD-10-CM

## 2017-03-04 DIAGNOSIS — M109 Gout, unspecified: Secondary | ICD-10-CM

## 2017-03-04 DIAGNOSIS — E118 Type 2 diabetes mellitus with unspecified complications: Secondary | ICD-10-CM

## 2017-03-04 DIAGNOSIS — G2581 Restless legs syndrome: Secondary | ICD-10-CM

## 2017-03-04 DIAGNOSIS — R972 Elevated prostate specific antigen [PSA]: Secondary | ICD-10-CM

## 2017-03-20 ENCOUNTER — Encounter: Payer: Self-pay | Admitting: Gastroenterology

## 2017-03-20 ENCOUNTER — Other Ambulatory Visit: Payer: Self-pay | Admitting: Family Medicine

## 2017-03-20 ENCOUNTER — Other Ambulatory Visit: Payer: Self-pay | Admitting: Cardiology

## 2017-03-21 NOTE — Telephone Encounter (Signed)
Rx(s) sent to pharmacy electronically.  

## 2017-04-03 ENCOUNTER — Encounter: Payer: Self-pay | Admitting: Gastroenterology

## 2017-04-25 ENCOUNTER — Other Ambulatory Visit: Payer: Self-pay | Admitting: Family Medicine

## 2017-05-01 ENCOUNTER — Ambulatory Visit: Payer: Medicare Other | Admitting: Family Medicine

## 2017-05-03 ENCOUNTER — Other Ambulatory Visit: Payer: Self-pay | Admitting: Family Medicine

## 2017-05-22 ENCOUNTER — Other Ambulatory Visit: Payer: Self-pay | Admitting: Cardiology

## 2017-05-23 NOTE — Telephone Encounter (Signed)
REFILL 

## 2017-05-29 ENCOUNTER — Encounter: Payer: Self-pay | Admitting: Gastroenterology

## 2017-05-29 ENCOUNTER — Ambulatory Visit (AMBULATORY_SURGERY_CENTER): Payer: Self-pay

## 2017-05-29 VITALS — Ht 72.0 in | Wt 211.4 lb

## 2017-05-29 DIAGNOSIS — Z85038 Personal history of other malignant neoplasm of large intestine: Secondary | ICD-10-CM

## 2017-05-29 MED ORDER — NA SULFATE-K SULFATE-MG SULF 17.5-3.13-1.6 GM/177ML PO SOLN: ORAL | 0 refills | Status: DC

## 2017-05-29 NOTE — Progress Notes (Signed)
Per pt, no allergies to soy or egg products.Pt not taking any weight loss meds or using  O2 at home.   Pt refused Emmi video. 

## 2017-06-03 ENCOUNTER — Other Ambulatory Visit: Payer: Self-pay | Admitting: Family Medicine

## 2017-06-07 ENCOUNTER — Other Ambulatory Visit: Payer: Self-pay | Admitting: Family Medicine

## 2017-06-07 ENCOUNTER — Ambulatory Visit (INDEPENDENT_AMBULATORY_CARE_PROVIDER_SITE_OTHER): Payer: Medicare Other | Admitting: Family Medicine

## 2017-06-07 ENCOUNTER — Encounter: Payer: Self-pay | Admitting: Family Medicine

## 2017-06-07 VITALS — BP 110/70 | HR 48 | Temp 98.2°F | Resp 16 | Ht 72.0 in | Wt 209.4 lb

## 2017-06-07 DIAGNOSIS — M1A0710 Idiopathic chronic gout, right ankle and foot, without tophus (tophi): Secondary | ICD-10-CM

## 2017-06-07 DIAGNOSIS — M542 Cervicalgia: Secondary | ICD-10-CM

## 2017-06-07 DIAGNOSIS — N189 Chronic kidney disease, unspecified: Secondary | ICD-10-CM

## 2017-06-07 DIAGNOSIS — G2581 Restless legs syndrome: Secondary | ICD-10-CM

## 2017-06-07 DIAGNOSIS — N289 Disorder of kidney and ureter, unspecified: Secondary | ICD-10-CM

## 2017-06-07 DIAGNOSIS — E784 Other hyperlipidemia: Secondary | ICD-10-CM | POA: Diagnosis not present

## 2017-06-07 DIAGNOSIS — R972 Elevated prostate specific antigen [PSA]: Secondary | ICD-10-CM

## 2017-06-07 DIAGNOSIS — E559 Vitamin D deficiency, unspecified: Secondary | ICD-10-CM | POA: Diagnosis not present

## 2017-06-07 DIAGNOSIS — E038 Other specified hypothyroidism: Secondary | ICD-10-CM

## 2017-06-07 DIAGNOSIS — I10 Essential (primary) hypertension: Secondary | ICD-10-CM

## 2017-06-07 DIAGNOSIS — M109 Gout, unspecified: Secondary | ICD-10-CM

## 2017-06-07 DIAGNOSIS — E118 Type 2 diabetes mellitus with unspecified complications: Secondary | ICD-10-CM

## 2017-06-07 DIAGNOSIS — D649 Anemia, unspecified: Secondary | ICD-10-CM

## 2017-06-07 DIAGNOSIS — E7849 Other hyperlipidemia: Secondary | ICD-10-CM

## 2017-06-07 DIAGNOSIS — Z0000 Encounter for general adult medical examination without abnormal findings: Secondary | ICD-10-CM

## 2017-06-07 DIAGNOSIS — N529 Male erectile dysfunction, unspecified: Secondary | ICD-10-CM

## 2017-06-07 HISTORY — DX: Chronic kidney disease, unspecified: N18.9

## 2017-06-07 NOTE — Progress Notes (Signed)
Patient ID: Daniel Gates, male   DOB: Feb 01, 1946, 71 y.o.   MRN: [id number]     Subjective:  I acted as a Education administrator for Dr. Charlett Blake.  Guerry Bruin, [place].   Patient ID: Daniel Gates, male    DOB: Apr 20, 1946, 71 y.o.   MRN: [id number]  Chief Complaint  Patient presents with  . Arm Pain    left    Arm Pain   Incident onset: november. Injury mechanism: was helping neighbor move. Pain location: whole left arm. The quality of the pain is described as aching. Associated symptoms include muscle weakness, numbness and tingling. Pertinent negatives include no chest pain. The symptoms are aggravated by lifting. Treatments tried: hydrocodone.    Patient is in today for left arm pain. Pain has been unremitting since his injury. No further injury or fall. Denies CP/palp/SOB/HA/congestion/fevers/GI or GU c/o. Taking meds as prescribed  Patient Care Team: [name], MD as PCP - General (Family Medicine) Linward Natal, MD as Consulting Physician (Ophthalmology)   Past Medical History:  Diagnosis Date  . Allergic rhinitis   . Bradycardia 10/01/2016  . Cancer ([place])    colon; diagnosed 2003  . Cataract 12/29/2016  . Chronic renal insufficiency 06/07/2017  . Depression   . Diabetes type 2, controlled ([place]) 06/27/2012  . Glaucoma   . Gout   . Hyperlipidemia   . Hypertension   . Medicare annual wellness visit, subsequent 02/15/2014  . Open-angle glaucoma 12/29/2016  . TMJ arthralgia 01/02/2016    Past Surgical History:  Procedure Laterality Date  . COLON SURGERY  2003   for colon cancer  . COLONOSCOPY    . INGUINAL HERNIA REPAIR  1997   right    Family History  Problem Relation Age of Onset  . Diabetes Mother   . Heart disease Mother        MI  . Cancer Father        prostate  . Prostate cancer Father   . Prostate cancer Brother   . Colon cancer Neg Hx   . Stomach cancer Neg Hx   . Esophageal cancer Neg Hx   . Rectal cancer Neg Hx     Social History   Social History  .  Marital status: Widowed    Spouse name: N/A  . Number of children: 7  . Years of education: N/A   Occupational History  .  Disabled   Social History Main Topics  . Smoking status: Never Smoker  . Smokeless tobacco: Never Used  . Alcohol use No  . Drug use: No  . Sexual activity: Not Currently   Other Topics Concern  . Not on file   Social History Narrative  . No narrative on file    Outpatient Medications Prior to Visit  Medication Sig Dispense Refill  . aspirin 325 MG tablet Take 325 mg by mouth daily.     Marland Kitchen atorvastatin (LIPITOR) 20 MG tablet Take 1 tablet (20 mg total) by mouth daily. 30 tablet 5  . COD LIVER OIL PO Take 1 tablet by mouth daily.     Marland Kitchen dicyclomine (BENTYL) 20 MG tablet Take 1 tablet (20 mg total) by mouth every 6 (six) hours. (Patient taking differently: Take 20 mg by mouth 2 (two) times daily. ) 120 tablet 0  . escitalopram (LEXAPRO) 10 MG tablet TAKE 1 TABLET BY MOUTH DAILY 30 tablet 0  . fenofibrate (TRICOR) 145 MG tablet TAKE 1 TABLET(130 MG) BY MOUTH DAILY BEFORE BREAKFAST 30  tablet 3  . furosemide (LASIX) 40 MG tablet Take 1 tablet (40 mg total) by mouth 2 (two) times daily as needed. Take 1 in the am and 1 in midafternoon for edema and BP as needed 60 tablet 11  . Garlic Oil 7616 MG CAPS Take 1 capsule by mouth daily.     . hydrochlorothiazide (HYDRODIURIL) 25 MG tablet TAKE 1 TABLET(25 MG) BY MOUTH DAILY 30 tablet 3  . HYDROcodone-acetaminophen (NORCO) 10-325 MG tablet Take 1 tablet by mouth every 6 (six) hours as needed for moderate pain or severe pain. 120 tablet 0  . indomethacin (INDOCIN SR) 75 MG CR capsule Take 1 capsule (75 mg total) by mouth 3 (three) times daily as needed. 90 capsule 6  . latanoprost (XALATAN) 0.005 % ophthalmic solution Place 1 drop into both eyes at bedtime.    . metFORMIN (GLUCOPHAGE) 500 MG tablet TAKE 1 TABLET(500 MG) BY MOUTH TWICE DAILY WITH A MEAL (Patient taking differently: TAKE 1 TABLET(500 MG) BY MOUTH DAILY WITH A  MEAL) 180 tablet 1  . Multiple Vitamins-Minerals (ONE-A-DAY MENS HEALTH FORMULA) TABS Take 1 tablet by mouth daily.    . Na Sulfate-K Sulfate-Mg Sulf (SUPREP BOWEL PREP KIT) 17.5-3.13-1.6 GM/180ML SOLN Suprep as directed / no substitutions 354 mL 0  . ONE TOUCH ULTRA TEST test strip USE AS DIRECTED TO TEST BLOOD SUGAR TWICE DAILY AS DIRECTED 100 each 5  . ONETOUCH DELICA LANCETS 07P MISC USE TO TEST BLOOD SUGAR TWICE DAILY AS DIRECTED 100 each 6  . promethazine (PHENERGAN) 25 MG tablet TAKE ONE (1) TABLET(S) EVERY FOUR (4) HOURS AS NEEDED FOR NAUSEA 60 tablet 0  . rOPINIRole (REQUIP) 1 MG tablet TAKE 1 TO 2 TABLETS BY MOUTH AT BEDTIME (Patient taking differently: TAKE 1 PILL TWICE A DAY) 60 tablet 2  . tamsulosin (FLOMAX) 0.4 MG CAPS capsule TAKE 1 CAPSULE BY MOUTH DAILY AFTER DINNER 30 capsule 0  . losartan (COZAAR) 100 MG tablet TAKE 1 TABLET BY MOUTH DAILY (Patient not taking: Reported on 05/29/2017) 90 tablet 0  . metoprolol (LOPRESSOR) 50 MG tablet TAKE 1 TABLET(50 MG) BY MOUTH TWICE DAILY (Patient taking differently: TAKE 1 TABLET(25 mg ) BY MOUTH DAILY) 60 tablet 6  . metoprolol succinate (TOPROL-XL) 25 MG 24 hr tablet TAKE 1 TABLET BY MOUTH EVERY DAY** CONTACT OFFICE FOR MORE REFILLS** 30 tablet 0   No facility-administered medications prior to visit.     No Known Allergies  Review of Systems  Constitutional: Negative for fever and malaise/fatigue.  HENT: Negative for congestion.   Eyes: Negative for blurred vision.  Respiratory: Negative for cough and shortness of breath.   Cardiovascular: Negative for chest pain, palpitations and leg swelling.  Gastrointestinal: Negative for vomiting.  Musculoskeletal: Positive for joint pain and neck pain. Negative for back pain.  Skin: Negative for rash.  Neurological: Positive for tingling and numbness. Negative for loss of consciousness and headaches.       Objective:    Physical Exam  Constitutional: He appears well-developed and  well-nourished. No distress.  HENT:  Head: Normocephalic and atraumatic.  Eyes: Conjunctivae are normal.  Neck: Normal range of motion. No thyromegaly present.  Cardiovascular: Normal rate and regular rhythm.   Pulmonary/Chest: Effort normal. He has no wheezes.  Abdominal: Soft. Bowel sounds are normal. There is no tenderness.  Musculoskeletal: Normal range of motion. He exhibits no edema or deformity.  Neurological: He is alert.  Pain with palpation over left sternocleidomastoid muscle.   Skin: Skin is  warm and dry. He is not diaphoretic.  Psychiatric: He has a normal mood and affect.    BP 110/70 (BP Location: Right Arm, Cuff Size: Normal)   Pulse (!) 48   Temp 98.2 F (36.8 C) (Oral)   Resp 16   Ht 6' (1.829 m)   Wt 209 lb 6.4 oz (95 kg)   SpO2 95%   BMI 28.40 kg/m  Wt Readings from Last 3 Encounters:  06/07/17 209 lb 6.4 oz (95 kg)  05/29/17 211 lb 6.4 oz (95.9 kg)  12/29/16 217 lb 9.6 oz (98.7 kg)   BP Readings from Last 3 Encounters:  06/07/17 110/70  12/29/16 130/74  10/19/16 134/86     Immunization History  Administered Date(s) Administered  . Td 12/04/1997  . Tdap 01/30/2012    Health Maintenance  Topic Date Due  . OPHTHALMOLOGY EXAM  10/01/2015  . URINE MICROALBUMIN  10/14/2016  . PNA vac Low Risk Adult (2 of 2 - PPSV23) 12/23/2016  . HEMOGLOBIN A1C  12/24/2016  . COLONOSCOPY  05/28/2017  . FOOT EXAM  05/18/2017  . INFLUENZA VACCINE  07/04/2017  . TETANUS/TDAP  01/29/2022  . Hepatitis C Screening  Completed    Lab Results  Component Value Date   WBC 6.6 06/07/2017   HGB 13.8 06/07/2017   HCT 42.2 06/07/2017   PLT 257.0 06/07/2017   GLUCOSE 84 06/07/2017   CHOL 170 06/07/2017   TRIG 216.0 (H) 06/07/2017   HDL 33.20 (L) 06/07/2017   LDLDIRECT 97.0 06/07/2017   LDLCALC NOT CALC 12/24/2015   ALT 22 06/07/2017   AST 25 06/07/2017   NA 137 06/07/2017   K 4.2 06/07/2017   CL 102 06/07/2017   CREATININE 1.84 (H) 06/07/2017   BUN 21 06/07/2017     CO2 29 06/07/2017   TSH 3.21 06/07/2017   PSA 7.46 (H) 09/28/2016   HGBA1C 6.1 06/23/2016   MICROALBUR 1.7 10/15/2015    Lab Results  Component Value Date   TSH 3.21 06/07/2017   Lab Results  Component Value Date   WBC 6.6 06/07/2017   HGB 13.8 06/07/2017   HCT 42.2 06/07/2017   MCV 77.6 (L) 06/07/2017   PLT 257.0 06/07/2017   Lab Results  Component Value Date   NA 137 06/07/2017   K 4.2 06/07/2017   CO2 29 06/07/2017   GLUCOSE 84 06/07/2017   BUN 21 06/07/2017   CREATININE 1.84 (H) 06/07/2017   BILITOT 0.3 06/07/2017   ALKPHOS 41 06/07/2017   AST 25 06/07/2017   ALT 22 06/07/2017   PROT 8.4 (H) 06/07/2017   ALBUMIN 4.0 06/07/2017   CALCIUM 10.2 06/07/2017   GFR 46.68 (L) 06/07/2017   Lab Results  Component Value Date   CHOL 170 06/07/2017   Lab Results  Component Value Date   HDL 33.20 (L) 06/07/2017   Lab Results  Component Value Date   LDLCALC NOT CALC 12/24/2015   Lab Results  Component Value Date   TRIG 216.0 (H) 06/07/2017   Lab Results  Component Value Date   CHOLHDL 5 06/07/2017   Lab Results  Component Value Date   HGBA1C 6.1 06/23/2016         Assessment & Plan:   Problem List Items Addressed This Visit    Hypothyroidism   Relevant Medications   metoprolol tartrate (LOPRESSOR) 25 MG tablet   Vitamin D deficiency - Primary   Relevant Orders   VITAMIN D 25 Hydroxy (Vit-D Deficiency, Fractures) (Completed)   Hyperlipidemia  Relevant Medications   metoprolol tartrate (LOPRESSOR) 25 MG tablet   Other Relevant Orders   Lipid panel (Completed)   Essential hypertension   Relevant Medications   metoprolol tartrate (LOPRESSOR) 25 MG tablet   Other Relevant Orders   CBC (Completed)   Comprehensive metabolic panel (Completed)   TSH (Completed)   Gout   Relevant Orders   Uric acid (Completed)   Preventative health care    Patient encouraged to maintain heart healthy diet, regular exercise, adequate sleep. Consider daily  probiotics. Take medications as prescribed      Neck pain    Left shoulder pain with LUE weakness. Referred to ortho for further consideration      Relevant Orders   Ambulatory referral to Orthopedic Surgery   Chronic renal insufficiency    Slightly improved. No changes to meds, maintain adequate hydration.       Relevant Orders   Comprehensive metabolic panel (Completed)      I have discontinued Mr. Lebeau losartan and metoprolol succinate. I am also having him maintain his aspirin, Garlic Oil, COD LIVER OIL PO, promethazine, indomethacin, ONE-A-DAY MENS HEALTH FORMULA, furosemide, HYDROcodone-acetaminophen, dicyclomine, atorvastatin, latanoprost, ONE TOUCH ULTRA TEST, hydrochlorothiazide, ONETOUCH DELICA LANCETS 00O, metFORMIN, rOPINIRole, fenofibrate, Na Sulfate-K Sulfate-Mg Sulf, escitalopram, tamsulosin, and metoprolol tartrate.  Meds ordered this encounter  Medications  . metoprolol tartrate (LOPRESSOR) 25 MG tablet    Sig: Take 25 mg by mouth 2 (two) times daily.    CMA served as Education administrator during this visit. History, Physical and Plan performed by medical provider. Documentation and orders reviewed and attested to.  Penni Homans, MD

## 2017-06-07 NOTE — Patient Instructions (Signed)

## 2017-06-08 LAB — COMPREHENSIVE METABOLIC PANEL
ALK PHOS: 41 U/L (ref 39–117)
ALT: 22 U/L (ref 0–53)
AST: 25 U/L (ref 0–37)
Albumin: 4 g/dL (ref 3.5–5.2)
BUN: 21 mg/dL (ref 6–23)
CO2: 29 meq/L (ref 19–32)
Calcium: 10.2 mg/dL (ref 8.4–10.5)
Chloride: 102 mEq/L (ref 96–112)
Creatinine, Ser: 1.84 mg/dL — ABNORMAL HIGH (ref 0.40–1.50)
GFR: 46.68 mL/min — AB (ref >60.00)
GLUCOSE: 84 mg/dL (ref 70–99)
POTASSIUM: 4.2 meq/L (ref 3.5–5.1)
Sodium: 137 mEq/L (ref 135–145)
Total Bilirubin: 0.3 mg/dL (ref 0.2–1.2)
Total Protein: 8.4 g/dL — ABNORMAL HIGH (ref 6.0–8.3)

## 2017-06-08 LAB — LIPID PANEL
Cholesterol: 170 mg/dL (ref 0–200)
HDL: 33.2 mg/dL — AB (ref >39.00)
NONHDL: 136.45
Total CHOL/HDL Ratio: 5
Triglycerides: 216 mg/dL — ABNORMAL HIGH (ref 0.0–149.0)
VLDL: 43.2 mg/dL — AB (ref 0.0–40.0)

## 2017-06-08 LAB — CBC
HEMATOCRIT: 42.2 % (ref 39.0–52.0)
HEMOGLOBIN: 13.8 g/dL (ref 13.0–17.0)
MCHC: 32.8 g/dL (ref 30.0–36.0)
MCV: 77.6 fl — ABNORMAL LOW (ref 78.0–100.0)
Platelets: 257 10*3/uL (ref 150.0–400.0)
RBC: 5.44 Mil/uL (ref 4.22–5.81)
RDW: 16.3 % — AB (ref 11.5–15.5)
WBC: 6.6 10*3/uL (ref 4.0–10.5)

## 2017-06-08 LAB — URIC ACID: Uric Acid, Serum: 6.3 mg/dL (ref 4.0–7.8)

## 2017-06-08 LAB — TSH: TSH: 3.21 u[IU]/mL (ref 0.35–4.50)

## 2017-06-08 LAB — VITAMIN D 25 HYDROXY (VIT D DEFICIENCY, FRACTURES): VITD: 55.63 ng/mL (ref 30.00–100.00)

## 2017-06-08 LAB — LDL CHOLESTEROL, DIRECT: LDL DIRECT: 97 mg/dL

## 2017-06-10 NOTE — Assessment & Plan Note (Signed)
Slightly improved. No changes to meds, maintain adequate hydration.

## 2017-06-10 NOTE — Assessment & Plan Note (Signed)
Patient encouraged to maintain heart healthy diet, regular exercise, adequate sleep. Consider daily probiotics. Take medications as prescribed 

## 2017-06-10 NOTE — Assessment & Plan Note (Signed)
Left shoulder pain with LUE weakness. Referred to ortho for further consideration

## 2017-06-12 ENCOUNTER — Encounter: Payer: Self-pay | Admitting: Gastroenterology

## 2017-06-12 ENCOUNTER — Ambulatory Visit (AMBULATORY_SURGERY_CENTER): Payer: Medicare Other | Admitting: Gastroenterology

## 2017-06-12 VITALS — BP 106/66 | HR 46 | Temp 96.9°F | Resp 15 | Ht 72.0 in | Wt 211.0 lb

## 2017-06-12 DIAGNOSIS — D571 Sickle-cell disease without crisis: Secondary | ICD-10-CM

## 2017-06-12 DIAGNOSIS — K649 Unspecified hemorrhoids: Secondary | ICD-10-CM

## 2017-06-12 DIAGNOSIS — Z85038 Personal history of other malignant neoplasm of large intestine: Secondary | ICD-10-CM | POA: Diagnosis not present

## 2017-06-12 DIAGNOSIS — D123 Benign neoplasm of transverse colon: Secondary | ICD-10-CM | POA: Diagnosis not present

## 2017-06-12 HISTORY — DX: Sickle-cell disease without crisis: D57.1

## 2017-06-12 MED ORDER — SODIUM CHLORIDE 0.9 % IV SOLN: 500.0000 mL | INTRAVENOUS | Status: DC

## 2017-06-12 NOTE — Progress Notes (Signed)
Spontaneous respirations throughout. VSS. Resting comfortably. To PACU on room air. Report to  [employer].

## 2017-06-12 NOTE — Progress Notes (Signed)
Called to room to assist during endoscopic procedure.  Patient ID and intended procedure confirmed with present staff. Received instructions for my participation in the procedure from the performing physician.  

## 2017-06-12 NOTE — Patient Instructions (Signed)
YOU HAD AN ENDOSCOPIC PROCEDURE TODAY AT THE [facility]:   Refer to the procedure report that was given to you for any specific questions about what was found during the examination.  If the procedure report does not answer your questions, please call your gastroenterologist to clarify.  If you requested that your care partner not be given the details of your procedure findings, then the procedure report has been included in a sealed envelope for you to review at your convenience later.  YOU SHOULD EXPECT: Some feelings of bloating in the abdomen. Passage of more gas than usual.  Walking can help get rid of the air that was put into your GI tract during the procedure and reduce the bloating. If you had a lower endoscopy (such as a colonoscopy or flexible sigmoidoscopy) you may notice spotting of blood in your stool or on the toilet paper. If you underwent a bowel prep for your procedure, you may not have a normal bowel movement for a few days.  Please Note:  You might notice some irritation and congestion in your nose or some drainage.  This is from the oxygen used during your procedure.  There is no need for concern and it should clear up in a day or so.  SYMPTOMS TO REPORT IMMEDIATELY:   Following lower endoscopy (colonoscopy or flexible sigmoidoscopy):  Excessive amounts of blood in the stool  Significant tenderness or worsening of abdominal pains  Swelling of the abdomen that is new, acute  Fever of 100F or higher   For urgent or emergent issues, a gastroenterologist can be reached at any hour by calling [phone].   DIET:  We do recommend a small meal at first, but then you may proceed to your regular diet.  Drink plenty of fluids but you should avoid alcoholic beverages for 24 hours.  Try to eat a high fiber diet, and drink plenty of water.  ACTIVITY:  You should plan to take it easy for the rest of today and you should NOT DRIVE or use heavy machinery until tomorrow  (because of the sedation medicines used during the test).    FOLLOW UP: Our staff will call the number listed on your records the next business day following your procedure to check on you and address any questions or concerns that you may have regarding the information given to you following your procedure. If we do not reach you, we will leave a message.  However, if you are feeling well and you are not experiencing any problems, there is no need to return our call.  We will assume that you have returned to your regular daily activities without incident.  If any biopsies were taken you will be contacted by phone or by letter within the next 1-3 weeks.  Please call us at [phone] if you have not heard about the biopsies in 3 weeks.    SIGNATURES/CONFIDENTIALITY: You and/or your care partner have signed paperwork which will be entered into your electronic medical record.  These signatures attest to the fact that that the information above on your After Visit Summary has been reviewed and is understood.  Full responsibility of the confidentiality of this discharge information lies with you and/or your care-partner. 

## 2017-06-12 NOTE — Op Note (Signed)
[place] Patient Name: Daniel Gates Procedure Date: 06/12/2017 8:22 AM MRN: [id number] Endoscopist: Milus Banister , MD Age: 71 Referring MD:  Date of Birth: 1946-06-04 Gender: Male Account #: [id number] Procedure:                Colonoscopy Indications:              High risk colon cancer surveillance: Personal                            history of colon cancer: 2003 transverse colon                            cancer, resected, adjuvant chemo; most recent                            colonsocopy Dr. Inocente Salles in 2008 found no polyps;                            Colonoscopy Dr. Ardis Hughs 2013 two subCM adenomas                            removed. Medicines:                Monitored Anesthesia Care Procedure:                Pre-Anesthesia Assessment:                           - Prior to the procedure, a History and Physical                            was performed, and patient medications and                            allergies were reviewed. The patient's tolerance of                            previous anesthesia was also reviewed. The risks                            and benefits of the procedure and the sedation                            options and risks were discussed with the patient.                            All questions were answered, and informed consent                            was obtained. Prior Anticoagulants: The patient has                            taken no previous anticoagulant or antiplatelet  agents. ASA Grade Assessment: II - A patient with                            mild systemic disease. After reviewing the risks                            and benefits, the patient was deemed in                            satisfactory condition to undergo the procedure.                           After obtaining informed consent, the colonoscope                            was passed under direct vision. Throughout the     procedure, the patient's blood pressure, pulse, and                            oxygen saturations were monitored continuously. The                            Colonoscope was introduced through the anus and                            advanced to the the cecum, identified by                            appendiceal orifice and ileocecal valve. The                            colonoscopy was performed without difficulty. The                            patient tolerated the procedure well. The quality                            of the bowel preparation was good. The ileocecal                            valve, appendiceal orifice, and rectum were                            photographed. Scope In: 8:29:26 AM Scope Out: 8:39:49 AM Scope Withdrawal Time: 0 hours 8 minutes 16 seconds  Total Procedure Duration: 0 hours 10 minutes 23 seconds  Findings:                 The transverse colocolonic anastomosis was normal                            appearing.                           Two sessile polyps were found in the transverse  colon. The polyps were 2 to 3 mm in size. These                            polyps were removed with a cold snare. Resection                            was complete, but the polyp tissue was only                            partially retrieved.                           Internal hemorrhoids were found. The hemorrhoids                            were small.                           The exam was otherwise without abnormality on                            direct and retroflexion views. Complications:            No immediate complications. Estimated blood loss:                            None. Estimated Blood Loss:     Estimated blood loss: none. Impression:               - Normal appearing end-to-end colo-colonic                            anastomosis.                           - Two 2 to 3 mm polyps in the transverse colon,                             removed with a cold snare. Complete resection.                            Partial retrieval.                           - Internal hemorrhoids.                           - The examination was otherwise normal on direct                            and retroflexion views. Recommendation:           - Patient has a contact number available for                            emergencies. The signs and symptoms of potential  delayed complications were discussed with the                            patient. Return to normal activities tomorrow.                            Written discharge instructions were provided to the                            patient.                           - Resume previous diet.                           - Continue present medications.                           You will receive a letter within 2-3 weeks with the                            pathology results and my final recommendations. If                            the polyp(s) is proven to be 'pre-cancerous' on                            pathology, you will need repeat colonoscopy in 5                            years. Milus Banister, MD 06/12/2017 8:46:19 AM This report has been signed electronically.

## 2017-06-13 ENCOUNTER — Telehealth: Payer: Self-pay | Admitting: *Deleted

## 2017-06-13 NOTE — Telephone Encounter (Signed)
  Follow up Call-  Call back number 06/12/2017  Post procedure Call Back phone  # [phone]  Permission to leave phone message Yes  Some recent data might be hidden     Patient questions:  Message left to call us if necessary.

## 2017-06-13 NOTE — Telephone Encounter (Signed)
No answer, left message to call if questions or concerns. 

## 2017-06-19 ENCOUNTER — Encounter: Payer: Self-pay | Admitting: Gastroenterology

## 2017-06-21 NOTE — Progress Notes (Signed)
Subjective:   Daniel Gates is a 71 y.o. male who presents for Medicare Annual/SubseqNuent preventive examination. He is very pleasant and energetic.  Review of Systems:  No ROS.  Medicare Wellness Visit. Additional risk factors are reflected in the social history.  Cardiac Risk Factors include: advanced age (>21men, >26 women);diabetes mellitus;dyslipidemia;male gender;hypertension;sedentary lifestyle Sleep patterns: Wake about 3 times to urinate. Sleeps from 11p-11a. Feels rested. Home Safety/Smoke Alarms: Feels safe in home. Smoke alarms in place.  Living environment; residence and Firearm Safety: Lives alone. No guns. No stairs. Seat Belt Safety/Bike Helmet: Wears seat belt.   Counseling:   Eye Exam-  Wears glasses. Eye doctor every 3 months to check on glaucoma. Dental- No dentist. Referral sheet given.  Male:   CCS- Last 06/12/17:  Pre-cancerous polyp removed. Recall 5 yrs PSA-  Lab Results  Component Value Date   PSA 7.46 (H) 09/28/2016   PSA 6.69 (H) 06/23/2016   PSA 4.54 (H) 10/15/2015       Objective:    Vitals: BP 136/78 (BP Location: Right Arm, Patient Position: Sitting, Cuff Size: Normal)   Pulse 66   Ht 6' (1.829 m)   Wt 212 lb 12.8 oz (96.5 kg)   SpO2 97%   BMI 28.86 kg/m   Body mass index is 28.86 kg/m.  Tobacco History  Smoking Status  . Never Smoker  Smokeless Tobacco  . Never Used     Counseling given: Not Answered   Past Medical History:  Diagnosis Date  . Allergic rhinitis   . Anxiety 2003   "after cancer surgery"  . Asthma    childhood per pt.  . Bradycardia 10/01/2016  . Cancer ([place])    colon; diagnosed 2003  . Cataract 12/29/2016   not sure which eye  . Chronic renal insufficiency 06/07/2017  . Depression   . Diabetes type 2, controlled ([place]) 06/27/2012  . Glaucoma   . Gout   . Hyperlipidemia   . Hypertension   . Medicare annual wellness visit, subsequent 02/15/2014  . Open-angle glaucoma 12/29/2016  . Sickle cell anemia  ([place]) 06/12/2017   "they say I have a trace"  . TMJ arthralgia 01/02/2016   Past Surgical History:  Procedure Laterality Date  . COLON SURGERY  2003   for colon cancer  . COLONOSCOPY    . INGUINAL HERNIA REPAIR  1997   right   Family History  Problem Relation Age of Onset  . Diabetes Mother   . Heart disease Mother        MI  . Cancer Father        prostate  . Prostate cancer Father   . Prostate cancer Brother   . Colon cancer Neg Hx   . Stomach cancer Neg Hx   . Esophageal cancer Neg Hx   . Rectal cancer Neg Hx    History  Sexual Activity  . Sexual activity: Not Currently    Outpatient Encounter Prescriptions as of 06/26/2017  Medication Sig  . aspirin 325 MG tablet Take 325 mg by mouth daily.   Marland Kitchen atorvastatin (LIPITOR) 20 MG tablet Take 1 tablet (20 mg total) by mouth daily.  . COD LIVER OIL PO Take 1 tablet by mouth daily.   Marland Kitchen dicyclomine (BENTYL) 20 MG tablet Take 1 tablet (20 mg total) by mouth every 6 (six) hours. (Patient taking differently: Take 20 mg by mouth 2 (two) times daily. )  . escitalopram (LEXAPRO) 10 MG tablet TAKE 1 TABLET BY MOUTH DAILY  .  fenofibrate (TRICOR) 145 MG tablet TAKE 1 TABLET(130 MG) BY MOUTH DAILY BEFORE BREAKFAST  . furosemide (LASIX) 40 MG tablet Take 1 tablet (40 mg total) by mouth 2 (two) times daily as needed. Take 1 in the am and 1 in midafternoon for edema and BP as needed  . Garlic Oil 1607 MG CAPS Take 1 capsule by mouth daily.   . hydrochlorothiazide (HYDRODIURIL) 25 MG tablet TAKE 1 TABLET(25 MG) BY MOUTH DAILY  . HYDROcodone-acetaminophen (NORCO) 10-325 MG tablet Take 1 tablet by mouth every 6 (six) hours as needed for moderate pain or severe pain.  . indomethacin (INDOCIN SR) 75 MG CR capsule Take 1 capsule (75 mg total) by mouth 3 (three) times daily as needed.  . latanoprost (XALATAN) 0.005 % ophthalmic solution Place 1 drop into both eyes at bedtime.  . metFORMIN (GLUCOPHAGE) 500 MG tablet TAKE 1 TABLET(500 MG) BY MOUTH  TWICE DAILY WITH A MEAL (Patient taking differently: TAKE 1 TABLET(500 MG) BY MOUTH DAILY WITH A MEAL)  . metoprolol tartrate (LOPRESSOR) 25 MG tablet Take 25 mg by mouth 2 (two) times daily.  . Multiple Vitamins-Minerals (ONE-A-DAY MENS HEALTH FORMULA) TABS Take 1 tablet by mouth daily.  . ONE TOUCH ULTRA TEST test strip USE AS DIRECTED TO TEST BLOOD SUGAR TWICE DAILY AS DIRECTED  . ONETOUCH DELICA LANCETS 37T MISC USE TO TEST BLOOD SUGAR TWICE DAILY AS DIRECTED  . promethazine (PHENERGAN) 25 MG tablet TAKE ONE (1) TABLET(S) EVERY FOUR (4) HOURS AS NEEDED FOR NAUSEA  . rOPINIRole (REQUIP) 1 MG tablet TAKE 1 TO 2 TABLETS BY MOUTH AT BEDTIME (Patient taking differently: TAKE 1 PILL TWICE A DAY)  . tamsulosin (FLOMAX) 0.4 MG CAPS capsule TAKE 1 CAPSULE BY MOUTH DAILY AFTER DINNER   Facility-Administered Encounter Medications as of 06/26/2017  Medication  . 0.9 %  sodium chloride infusion    Activities of Daily Living In your present state of health, do you have any difficulty performing the following activities: 06/26/2017  Hearing? N  Vision? N  Difficulty concentrating or making decisions? N  Walking or climbing stairs? N  Dressing or bathing? N  Doing errands, shopping? N  Preparing Food and eating ? N  Using the Toilet? N  In the past six months, have you accidently leaked urine? N  Do you have problems with loss of bowel control? N  Managing your Medications? N  Managing your Finances? N  Housekeeping or managing your Housekeeping? N  Some recent data might be hidden    Patient Care Team: [name], MD as PCP - General (Family Medicine) Linward Natal, MD as Consulting Physician (Ophthalmology)   Assessment:     Physical assessment deferred to PCP.  Exercise Activities and Dietary recommendations Current Exercise Habits: The patient does not participate in regular exercise at present (Likes to work in the garden), Exercise limited by: None identified   Diet (meal  preparation, eat out, water intake, caffeinated beverages, dairy products, fruits and vegetables): in general, an "unhealthy" diet Breakfast: treat meat sandwich. Coffee.  Lunch: Spaghetti and meatballs and soda Dinner:  Late lunch. Drinks about 2 cups of water per day.    Goals    . Decrease soda or juice intake    . Eat more fruits and vegetables      Fall Risk Fall Risk  06/26/2017 06/23/2016 12/24/2015 02/15/2014 11/07/2013  Falls in the past year? No No No No No   Depression Screen [facility] 2/9 Scores 06/26/2017 06/23/2016 12/24/2015 02/15/2014  PHQ - 2 Score 0 2 0 0  PHQ- 9 Score - 4 - -    Cognitive Function MMSE - Mini Mental State Exam 06/26/2017 06/23/2016  Orientation to time 5 5  Orientation to Place 5 5  Registration 3 3  Attention/ Calculation 5 5  Recall 2 2  Language- name 2 objects 2 2  Language- repeat 1 1  Language- follow 3 step command 3 3  Language- read & follow direction 1 1  Write a sentence 1 1  Copy design 1 1  Total score 29 29        Immunization History  Administered Date(s) Administered  . Td 12/04/1997  . Tdap 01/30/2012   Screening Tests Health Maintenance  Topic Date Due  . URINE MICROALBUMIN  10/14/2016  . HEMOGLOBIN A1C  12/24/2016  . FOOT EXAM  05/18/2017  . PNA vac Low Risk Adult (2 of 2 - PPSV23) 06/26/2018 (Originally 12/23/2016)  . INFLUENZA VACCINE  07/04/2017  . OPHTHALMOLOGY EXAM  03/27/2018  . TETANUS/TDAP  01/29/2022  . COLONOSCOPY  06/12/2022  . Hepatitis C Screening  Completed      Plan:   Follow up with Dr.Blyth as scheduled  Eat heart healthy diet (full of fruits, vegetables, whole grains, lean protein, water--limit salt, fat, and sugar intake) and increase physical activity as tolerated.  Continue doing brain stimulating activities (puzzles, reading, adult coloring books, staying active) to keep memory sharp.     I have personally reviewed and noted the following in the patient's chart:   . Medical and social  history . Use of alcohol, tobacco or illicit drugs  . Current medications and supplements . Functional ability and status . Nutritional status . Physical activity . Advanced directives . List of other physicians . Hospitalizations, surgeries, and ER visits in previous 12 months . Vitals . Screenings to include cognitive, depression, and falls . Referrals and appointments  In addition, I have reviewed and discussed with patient certain preventive protocols, quality metrics, and best practice recommendations. A written personalized care plan for preventive services as well as general preventive health recommendations were provided to patient.     Naaman Plummer [place], [place]  06/26/2017

## 2017-06-25 ENCOUNTER — Ambulatory Visit: Payer: Medicare Other | Admitting: *Deleted

## 2017-06-26 ENCOUNTER — Encounter: Payer: Self-pay | Admitting: *Deleted

## 2017-06-26 ENCOUNTER — Ambulatory Visit (INDEPENDENT_AMBULATORY_CARE_PROVIDER_SITE_OTHER): Payer: Medicare Other | Admitting: *Deleted

## 2017-06-26 VITALS — BP 136/78 | HR 66 | Ht 72.0 in | Wt 212.8 lb

## 2017-06-26 DIAGNOSIS — Z0000 Encounter for general adult medical examination without abnormal findings: Secondary | ICD-10-CM

## 2017-06-26 NOTE — Patient Instructions (Signed)
Daniel Gates , Thank you for taking time to come for your Medicare Wellness Visit. I appreciate your ongoing commitment to your health goals. Please review the following plan we discussed and let me know if I can assist you in the future.   These are the goals we discussed: Goals    . Decrease soda or juice intake    . Eat more fruits and vegetables       This is a list of the screening recommended for you and due dates:  Health Maintenance  Topic Date Due  . Urine Protein Check  10/14/2016  . Hemoglobin A1C  12/24/2016  . Complete foot exam   05/18/2017  . Pneumonia vaccines (2 of 2 - PPSV23) 06/26/2018*  . Flu Shot  07/04/2017  . Eye exam for diabetics  03/27/2018  . Tetanus Vaccine  01/29/2022  . Colon Cancer Screening  06/12/2022  .  Hepatitis C: One time screening is recommended by Center for Disease Control  (CDC) for  adults born from [birth year] through 1966.   Completed  *Topic was postponed. The date shown is not the original due date.    Follow up with Dr.Blyth as scheduled  Eat heart healthy diet (full of fruits, vegetables, whole grains, lean protein, water--limit salt, fat, and sugar intake) and increase physical activity as tolerated.  Continue doing brain stimulating activities (puzzles, reading, adult coloring books, staying active) to keep memory sharp.    DASH Eating Plan DASH stands for "Dietary Approaches to Stop Hypertension." The DASH eating plan is a healthy eating plan that has been shown to reduce high blood pressure (hypertension). It may also reduce your risk for type 2 diabetes, heart disease, and stroke. The DASH eating plan may also help with weight loss. What are tips for following this plan? General guidelines  Avoid eating more than 2,300 mg (milligrams) of salt (sodium) a day. If you have hypertension, you may need to reduce your sodium intake to 1,500 mg a day.  Limit alcohol intake to no more than 1 drink a day for nonpregnant women and 2 drinks a  day for men. One drink equals 12 oz of beer, 5 oz of wine, or 1 oz of hard liquor.  Work with your health care provider to maintain a healthy body weight or to lose weight. Ask what an ideal weight is for you.  Get at least 30 minutes of exercise that causes your heart to beat faster (aerobic exercise) most days of the week. Activities may include walking, swimming, or biking.  Work with your health care provider or diet and nutrition specialist (dietitian) to adjust your eating plan to your individual calorie needs. Reading food labels  Check food labels for the amount of sodium per serving. Choose foods with less than 5 percent of the Daily Value of sodium. Generally, foods with less than 300 mg of sodium per serving fit into this eating plan.  To find whole grains, look for the word "whole" as the first word in the ingredient list. Shopping  Buy products labeled as "low-sodium" or "no salt added."  Buy fresh foods. Avoid canned foods and premade or frozen meals. Cooking  Avoid adding salt when cooking. Use salt-free seasonings or herbs instead of table salt or sea salt. Check with your health care provider or pharmacist before using salt substitutes.  Do not fry foods. Cook foods using healthy methods such as baking, boiling, grilling, and broiling instead.  Cook with heart-healthy oils, such as olive,  canola, soybean, or sunflower oil. Meal planning   Eat a balanced diet that includes: ? 5 or more servings of fruits and vegetables each day. At each meal, try to fill half of your plate with fruits and vegetables. ? Up to 6-8 servings of whole grains each day. ? Less than 6 oz of lean meat, poultry, or fish each day. A 3-oz serving of meat is about the same size as a deck of cards. One egg equals 1 oz. ? 2 servings of low-fat dairy each day. ? A serving of nuts, seeds, or beans 5 times each week. ? Heart-healthy fats. Healthy fats called Omega-3 fatty acids are found in foods such  as flaxseeds and coldwater fish, like sardines, salmon, and mackerel.  Limit how much you eat of the following: ? Canned or prepackaged foods. ? Food that is high in trans fat, such as fried foods. ? Food that is high in saturated fat, such as fatty meat. ? Sweets, desserts, sugary drinks, and other foods with added sugar. ? Full-fat dairy products.  Do not salt foods before eating.  Try to eat at least 2 vegetarian meals each week.  Eat more home-cooked food and less restaurant, buffet, and fast food.  When eating at a restaurant, ask that your food be prepared with less salt or no salt, if possible. What foods are recommended? The items listed may not be a complete list. Talk with your dietitian about what dietary choices are best for you. Grains Whole-grain or whole-wheat bread. Whole-grain or whole-wheat pasta. Brown rice. [place]. Bulgur. Whole-grain and low-sodium cereals. Pita bread. Low-fat, low-sodium crackers. Whole-wheat flour tortillas. Vegetables Fresh or frozen vegetables (raw, steamed, roasted, or grilled). Low-sodium or reduced-sodium tomato and vegetable juice. Low-sodium or reduced-sodium tomato sauce and tomato paste. Low-sodium or reduced-sodium canned vegetables. Fruits All fresh, dried, or frozen fruit. Canned fruit in natural juice (without added sugar). Meat and other protein foods Skinless chicken or [place]. Ground chicken or [place]. Pork with fat trimmed off. Fish and seafood. Egg whites. Dried beans, peas, or lentils. Unsalted nuts, nut butters, and seeds. Unsalted canned beans. Lean cuts of beef with fat trimmed off. Low-sodium, lean deli meat. Dairy Low-fat (1%) or fat-free (skim) milk. Fat-free, low-fat, or reduced-fat cheeses. Nonfat, low-sodium ricotta or cottage cheese. Low-fat or nonfat yogurt. Low-fat, low-sodium cheese. Fats and oils Soft margarine without trans fats. Vegetable oil. Low-fat, reduced-fat, or light mayonnaise and salad dressings  (reduced-sodium). Canola, safflower, olive, soybean, and sunflower oils. Avocado. Seasoning and other foods Herbs. Spices. Seasoning mixes without salt. Unsalted popcorn and pretzels. Fat-free sweets. What foods are not recommended? The items listed may not be a complete list. Talk with your dietitian about what dietary choices are best for you. Grains Baked goods made with fat, such as croissants, muffins, or some breads. Dry pasta or rice meal packs. Vegetables Creamed or fried vegetables. Vegetables in a cheese sauce. Regular canned vegetables (not low-sodium or reduced-sodium). Regular canned tomato sauce and paste (not low-sodium or reduced-sodium). Regular tomato and vegetable juice (not low-sodium or reduced-sodium). Angie Fava. Olives. Fruits Canned fruit in a light or heavy syrup. Fried fruit. Fruit in cream or butter sauce. Meat and other protein foods Fatty cuts of meat. Ribs. Fried meat. Berniece Salines. Sausage. Bologna and other processed lunch meats. Salami. Fatback. Hotdogs. Bratwurst. Salted nuts and seeds. Canned beans with added salt. Canned or smoked fish. Whole eggs or egg yolks. Chicken or [place] with skin. Dairy Whole or 2% milk, cream, and half-and-half.  Whole or full-fat cream cheese. Whole-fat or sweetened yogurt. Full-fat cheese. Nondairy creamers. Whipped toppings. Processed cheese and cheese spreads. Fats and oils Butter. Stick margarine. Lard. Shortening. Ghee. Bacon fat. Tropical oils, such as coconut, palm kernel, or palm oil. Seasoning and other foods Salted popcorn and pretzels. Onion salt, garlic salt, seasoned salt, table salt, and sea salt. Worcestershire sauce. Tartar sauce. Barbecue sauce. Teriyaki sauce. Soy sauce, including reduced-sodium. Steak sauce. Canned and packaged gravies. Fish sauce. Oyster sauce. Cocktail sauce. Horseradish that you find on the shelf. Ketchup. Mustard. Meat flavorings and tenderizers. Bouillon cubes. Hot sauce and Tabasco sauce. Premade or  packaged marinades. Premade or packaged taco seasonings. Relishes. Regular salad dressings. Where to find more information:  National Heart, Lung, and [place]: https://wilson-eaton.com/  American Heart Association: www.heart.org Summary  The DASH eating plan is a healthy eating plan that has been shown to reduce high blood pressure (hypertension). It may also reduce your risk for type 2 diabetes, heart disease, and stroke.  With the DASH eating plan, you should limit salt (sodium) intake to 2,300 mg a day. If you have hypertension, you may need to reduce your sodium intake to 1,500 mg a day.  When on the DASH eating plan, aim to eat more fresh fruits and vegetables, whole grains, lean proteins, low-fat dairy, and heart-healthy fats.  Work with your health care provider or diet and nutrition specialist (dietitian) to adjust your eating plan to your individual calorie needs. This information is not intended to replace advice given to you by your health care provider. Make sure you discuss any questions you have with your health care provider. Document Released: 11/09/2011 Document Revised: 11/13/2016 Document Reviewed: 11/13/2016 Elsevier Interactive Patient Education  2017 [place] Maintenance, Male A healthy lifestyle and preventive care is important for your health and wellness. Ask your health care provider about what schedule of regular examinations is right for you. What should I know about weight and diet? Eat a Healthy Diet  Eat plenty of vegetables, fruits, whole grains, low-fat dairy products, and lean protein.  Do not eat a lot of foods high in solid fats, added sugars, or salt.  Maintain a Healthy Weight Regular exercise can help you achieve or maintain a healthy weight. You should:  Do at least 150 minutes of exercise each week. The exercise should increase your heart rate and make you sweat (moderate-intensity exercise).  Do strength-training exercises at  least twice a week.  Watch Your Levels of Cholesterol and Blood Lipids  Have your blood tested for lipids and cholesterol every 5 years starting at 72 years of age. If you are at high risk for heart disease, you should start having your blood tested when you are 72 years old. You may need to have your cholesterol levels checked more often if: ? Your lipid or cholesterol levels are high. ? You are older than 72 years of age. ? You are at high risk for heart disease.  What should I know about cancer screening? Many types of cancers can be detected early and may often be prevented. Lung Cancer  You should be screened every year for lung cancer if: ? You are a current smoker who has smoked for at least 30 years. ? You are a former smoker who has quit within the past 15 years.  Talk to your health care provider about your screening options, when you should start screening, and how often you should be screened.  Colorectal Cancer  Routine colorectal cancer screening usually begins at 72 years of age and should be repeated every 5-10 years until you are 72 years old. You may need to be screened more often if early forms of precancerous polyps or small growths are found. Your health care provider may recommend screening at an earlier age if you have risk factors for colon cancer.  Your health care provider may recommend using home test kits to check for hidden blood in the stool.  A small camera at the end of a tube can be used to examine your colon (sigmoidoscopy or colonoscopy). This checks for the earliest forms of colorectal cancer.  Prostate and Testicular Cancer  Depending on your age and overall health, your health care provider may do certain tests to screen for prostate and testicular cancer.  Talk to your health care provider about any symptoms or concerns you have about testicular or prostate cancer.  Skin Cancer  Check your skin from head to toe regularly.  Tell your health  care provider about any new moles or changes in moles, especially if: ? There is a change in a mole's size, shape, or color. ? You have a mole that is larger than a pencil eraser.  Always use sunscreen. Apply sunscreen liberally and repeat throughout the day.  Protect yourself by wearing long sleeves, pants, a wide-brimmed hat, and sunglasses when outside.  What should I know about heart disease, diabetes, and high blood pressure?  If you are 68-3 years of age, have your blood pressure checked every 3-5 years. If you are 54 years of age or older, have your blood pressure checked every year. You should have your blood pressure measured twice-once when you are at a hospital or clinic, and once when you are not at a hospital or clinic. Record the average of the two measurements. To check your blood pressure when you are not at a hospital or clinic, you can use: ? An automated blood pressure machine at a pharmacy. ? A home blood pressure monitor.  Talk to your health care provider about your target blood pressure.  If you are between 79-45 years old, ask your health care provider if you should take aspirin to prevent heart disease.  Have regular diabetes screenings by checking your fasting blood sugar level. ? If you are at a normal weight and have a low risk for diabetes, have this test once every three years after the age of 29. ? If you are overweight and have a high risk for diabetes, consider being tested at a younger age or more often.  A one-time screening for abdominal aortic aneurysm (AAA) by ultrasound is recommended for men aged 78-75 years who are current or former smokers. What should I know about preventing infection? Hepatitis B If you have a higher risk for hepatitis B, you should be screened for this virus. Talk with your health care provider to find out if you are at risk for hepatitis B infection. Hepatitis C Blood testing is recommended for:  Everyone born from [birth year]  through 1966.  Anyone with known risk factors for hepatitis C.  Sexually Transmitted Diseases (STDs)  You should be screened each year for STDs including gonorrhea and chlamydia if: ? You are sexually active and are younger than 71 years of age. ? You are older than 72 years of age and your health care provider tells you that you are at risk for this type of infection. ? Your sexual activity has changed since you  were last screened and you are at an increased risk for chlamydia or gonorrhea. Ask your health care provider if you are at risk.  Talk with your health care provider about whether you are at high risk of being infected with HIV. Your health care provider may recommend a prescription medicine to help prevent HIV infection.  What else can I do?  Schedule regular health, dental, and eye exams.  Stay current with your vaccines (immunizations).  Do not use any tobacco products, such as cigarettes, chewing tobacco, and e-cigarettes. If you need help quitting, ask your health care provider.  Limit alcohol intake to no more than 2 drinks per day. One drink equals 12 ounces of beer, 5 ounces of wine, or 1 ounces of hard liquor.  Do not use street drugs.  Do not share needles.  Ask your health care provider for help if you need support or information about quitting drugs.  Tell your health care provider if you often feel depressed.  Tell your health care provider if you have ever been abused or do not feel safe at home. This information is not intended to replace advice given to you by your health care provider. Make sure you discuss any questions you have with your health care provider. Document Released: 05/18/2008 Document Revised: 07/19/2016 Document Reviewed: 08/24/2015 Elsevier Interactive Patient Education  [employer].

## 2017-06-28 ENCOUNTER — Other Ambulatory Visit: Payer: Self-pay | Admitting: Family Medicine

## 2017-06-28 ENCOUNTER — Other Ambulatory Visit: Payer: Self-pay | Admitting: Cardiology

## 2017-07-04 ENCOUNTER — Other Ambulatory Visit: Payer: Self-pay | Admitting: Family Medicine

## 2017-07-05 ENCOUNTER — Other Ambulatory Visit: Payer: Self-pay | Admitting: Family Medicine

## 2017-07-05 DIAGNOSIS — E118 Type 2 diabetes mellitus with unspecified complications: Secondary | ICD-10-CM

## 2017-07-05 DIAGNOSIS — N529 Male erectile dysfunction, unspecified: Secondary | ICD-10-CM

## 2017-07-05 DIAGNOSIS — D649 Anemia, unspecified: Secondary | ICD-10-CM

## 2017-07-05 DIAGNOSIS — R972 Elevated prostate specific antigen [PSA]: Secondary | ICD-10-CM

## 2017-07-05 DIAGNOSIS — E559 Vitamin D deficiency, unspecified: Secondary | ICD-10-CM

## 2017-07-05 DIAGNOSIS — G2581 Restless legs syndrome: Secondary | ICD-10-CM

## 2017-07-05 DIAGNOSIS — M109 Gout, unspecified: Secondary | ICD-10-CM

## 2017-07-06 ENCOUNTER — Encounter (INDEPENDENT_AMBULATORY_CARE_PROVIDER_SITE_OTHER): Payer: Self-pay | Admitting: Orthopaedic Surgery

## 2017-07-06 ENCOUNTER — Ambulatory Visit (INDEPENDENT_AMBULATORY_CARE_PROVIDER_SITE_OTHER): Payer: Medicare Other | Admitting: Orthopaedic Surgery

## 2017-07-06 ENCOUNTER — Ambulatory Visit (INDEPENDENT_AMBULATORY_CARE_PROVIDER_SITE_OTHER): Payer: Self-pay

## 2017-07-06 ENCOUNTER — Ambulatory Visit (INDEPENDENT_AMBULATORY_CARE_PROVIDER_SITE_OTHER): Payer: Medicare Other

## 2017-07-06 VITALS — BP 110/69 | HR 62 | Resp 14 | Ht 72.0 in | Wt 200.0 lb

## 2017-07-06 DIAGNOSIS — M25512 Pain in left shoulder: Secondary | ICD-10-CM

## 2017-07-06 DIAGNOSIS — G8929 Other chronic pain: Secondary | ICD-10-CM | POA: Diagnosis not present

## 2017-07-06 DIAGNOSIS — M542 Cervicalgia: Secondary | ICD-10-CM | POA: Diagnosis not present

## 2017-07-06 DIAGNOSIS — M25511 Pain in right shoulder: Secondary | ICD-10-CM

## 2017-07-06 NOTE — Progress Notes (Signed)
Office Visit Note   Patient: Daniel Gates           Date of Birth: 09-14-1946           MRN: [id number] Visit Date: 07/06/2017              Requested by: [name], MD [address] [place], [place] [address] PCP: [name], MD   Assessment & Plan: Visit Diagnoses:  1. Chronic left shoulder pain   2. Neck pain   3. Chronic right shoulder pain   Impingement syndrome left shoulder with possibility of rotator cuff tear. Pain is been present since November 2017.  Plan: MRI scan left shoulder. Daniel Gates is diabetic and would prefer not to have cortisone. Has been trying exercises and over-the-counter medicines with persistent pain  Follow-Up Instructions: Return after MRI left shoulder.   Orders:  Orders Placed This Encounter  Procedures  . XR Shoulder Left  . MR Shoulder Left w/o contrast   No orders of the defined types were placed in this encounter.     Procedures: No procedures performed   Clinical Data: No additional findings.   Subjective: Chief Complaint  Patient presents with  . Neck - Pain    Daniel Gates is a 71 y o that present with Neck and Left sided shoulder pain with numbness in his left hand. X  8 months when he was helping a neighbor move. Diabetic  . Left Shoulder - Pain, Weakness, Numbness    Limited ROM with overhead movement  . Right Shoulder - Weakness, Numbness    Also relates the Right shoulder now exhibiting same symptoms as Left side  Daniel Gates relates a problem with his left shoulder since helping a friend move furniture in November 2017. He initially was having some neck discomfort. Films of his neck demonstrated some mild degenerative changes. He is really not had much issue with numbness and tingling except about his right shoulder. He's had some mild right shoulder pain but not to extend these having trouble on the left. He's had some difficulty sleeping and even raising his left arm over his head. He  is left hand nondominant. He is diabetic  HPI  Review of Systems   Objective: Vital Signs: BP 110/69   Pulse 62   Resp 14   Ht 6' (1.829 m)   Wt 200 lb (90.7 kg)   BMI 27.12 kg/m   Physical Exam  Ortho Exam left shoulder with positive impingement on the extreme of external rotation. Positive empty can testing. Able to raise his arm over his head with a circuitous motion. Biceps intact. Skin intact. Good strength with internal and external rotation. Some tenderness along the anterior lateral aspect of the subacromial region. No pain at the acromioclavicular joint. Good grip and good release. Mild stiffness of cervical spine but without referred pain. Able to touch his chin to his chest. Some loss of neck extension but with negative referred pain to either shoulder  Specialty Comments:  No specialty comments available.  Imaging: Xr Shoulder Left  Result Date: 07/06/2017 Films of the left shoulder obtained in several projections. Is no evidence of ectopic calcification. The humeral head is centered about the glenoid. No evidence of osteoarthritis. Normal space between the humeral head and acromion.. Mild degenerative changes at the acromioclavicular joint.    PMFS History: Patient Active Problem List   Diagnosis Date Noted  . Chronic renal insufficiency 06/07/2017  . Neck  pain 12/31/2016  . Cataract 12/29/2016  . Medication management 10/19/2016  . Bradycardia 10/01/2016  . Preventative health care 10/01/2016  . Absence of bladder continence 07/30/2016  . TMJ arthralgia 01/02/2016  . Edema 07/25/2014  . Left knee pain 07/25/2014  . Abnormal EKG 04/01/2014  . Medicare annual wellness visit, subsequent 02/15/2014  . Restless leg syndrome 11/21/2012  . Elevated serum creatinine 06/27/2012  . Non-insulin treated type 2 diabetes mellitus ([place]) 06/27/2012  . ED (erectile dysfunction) 02/03/2012  . Elevated PSA 12/15/2011  . IRRITABLE BOWEL SYNDROME 12/29/2010  . BEN LOC  HYPERPLASIA PROS W/O UR OBST & OTH LUTS 11/03/2009  . Urinary frequency 11/03/2009  . Gout 03/11/2009  . Vitamin D deficiency 10/27/2008  . Anemia 10/27/2008  . ATOPIC RHINITIS 10/27/2008  . LOW BACK PAIN, CHRONIC 06/25/2008  . Hypothyroidism 10/17/2007  . Hyperlipidemia 10/17/2007  . DEPRESSION 07/19/2007  . Essential hypertension 07/19/2007  . COLON CANCER, HX OF 07/19/2007   Past Medical History:  Diagnosis Date  . Allergic rhinitis   . Anxiety 2003   "after cancer surgery"  . Asthma    childhood per pt.  . Bradycardia 10/01/2016  . Cancer ([place])    colon; diagnosed 2003  . Cataract 12/29/2016   not sure which eye  . Chronic renal insufficiency 06/07/2017  . Depression   . Diabetes type 2, controlled ([place]) 06/27/2012  . Glaucoma   . Gout   . Hyperlipidemia   . Hypertension   . Medicare annual wellness visit, subsequent 02/15/2014  . Open-angle glaucoma 12/29/2016  . Sickle cell anemia ([place]) 06/12/2017   "they say I have a trace"  . TMJ arthralgia 01/02/2016    Family History  Problem Relation Age of Onset  . Diabetes Mother   . Heart disease Mother        MI  . Cancer Father        prostate  . Prostate cancer Father   . Prostate cancer Brother   . Colon cancer Neg Hx   . Stomach cancer Neg Hx   . Esophageal cancer Neg Hx   . Rectal cancer Neg Hx     Past Surgical History:  Procedure Laterality Date  . COLON SURGERY  2003   for colon cancer  . COLONOSCOPY    . INGUINAL HERNIA REPAIR  1997   right   Social History   Occupational History  .  Disabled   Social History Main Topics  . Smoking status: Never Smoker  . Smokeless tobacco: Never Used  . Alcohol use No  . Drug use: No  . Sexual activity: Not Currently     Garald Balding, MD   Note - This record has been created using [employer].  Chart creation errors have been sought, but may not always  have been located. Such creation errors do not reflect on  the standard of medical care.

## 2017-07-23 ENCOUNTER — Ambulatory Visit
Admission: RE | Admit: 2017-07-23 | Discharge: 2017-07-23 | Disposition: A | Discharge status: Home / Self Care | Payer: Medicare Other | Source: Ambulatory Visit | Attending: Orthopaedic Surgery | Admitting: Orthopaedic Surgery

## 2017-07-23 DIAGNOSIS — G8929 Other chronic pain: Secondary | ICD-10-CM

## 2017-07-23 DIAGNOSIS — M25512 Pain in left shoulder: Principal | ICD-10-CM

## 2017-07-31 ENCOUNTER — Ambulatory Visit (INDEPENDENT_AMBULATORY_CARE_PROVIDER_SITE_OTHER): Payer: Medicare Other | Admitting: Orthopaedic Surgery

## 2017-07-31 ENCOUNTER — Other Ambulatory Visit: Payer: Self-pay | Admitting: *Deleted

## 2017-07-31 ENCOUNTER — Encounter (INDEPENDENT_AMBULATORY_CARE_PROVIDER_SITE_OTHER): Payer: Self-pay | Admitting: Orthopaedic Surgery

## 2017-07-31 VITALS — BP 122/77 | HR 59 | Resp 14 | Ht 72.0 in | Wt 200.0 lb

## 2017-07-31 DIAGNOSIS — M25512 Pain in left shoulder: Principal | ICD-10-CM

## 2017-07-31 DIAGNOSIS — M79642 Pain in left hand: Secondary | ICD-10-CM

## 2017-07-31 DIAGNOSIS — G8929 Other chronic pain: Secondary | ICD-10-CM

## 2017-07-31 DIAGNOSIS — M25532 Pain in left wrist: Secondary | ICD-10-CM

## 2017-07-31 DIAGNOSIS — M25531 Pain in right wrist: Secondary | ICD-10-CM

## 2017-07-31 NOTE — Progress Notes (Signed)
Office Visit Note   Patient: Daniel Gates           Date of Birth: 03/02/1946           MRN: [id number] Visit Date: 07/31/2017              Requested by: [name], MD [address] [place], [place] [address] PCP: [name], MD   Assessment & Plan: Visit Diagnoses:  1. Chronic left shoulder pain   2. Pain of left hand   Impingement syndrome left shoulder with subacromial bursitis and degenerative changes at the acromioclavicular joint. Left hand pain I believe his carpal tunnel syndrome  Plan: Long discussion regarding findings by MRI scan. EMGs and nerve conduction studies left upper extremity to rule out carpal tunnel syndrome. Physical therapy for left shoulder impingement. Hold on cortisone injection. Did discuss possible surgery for distal clavicle resection and subacromial decompression. Would consider that if nothing else works. Office after EMGs and nerve conduction studies  Follow-Up Instructions: Return after EMG's.   Orders:  No orders of the defined types were placed in this encounter.  No orders of the defined types were placed in this encounter.     Procedures: No procedures performed   Clinical Data: No additional findings.   Subjective: Chief Complaint  Patient presents with  . Left Shoulder - Results    MRI review of Left shoulder  As previously outlined Mr. [name] has chronic left shoulder pain. He is diabetic and was concerned about receiving a cortisone injection. Accordingly I ordered an MRI scan. This scan demonstrated moderate to moderately severe degenerative changes at the acromioclavicular joint with a type I acromion. He does have an os acromion now. There was some fluid in the subacromial subdeltoid bursa. There was supra and infraspinatus tendinopathy without tear. Labrum was intact. Glenohumeral joint was negative. His symptoms are unchanged. In addition he's been experiencing numbness and tingling in the left  upper extremity. He's had some difficulty at night having to wake up and shake his hand  With some numbness of the radial 3 digits. He denies history of injury or trauma. He is not having any neck symptoms.  HPI  Review of Systems  Constitutional: Negative for fatigue.  HENT: Negative for hearing loss.   Respiratory: Negative for apnea, chest tightness and shortness of breath.   Cardiovascular: Negative for chest pain, palpitations and leg swelling.  Gastrointestinal: Negative for blood in stool, constipation and diarrhea.  Genitourinary: Negative for difficulty urinating.  Musculoskeletal: Negative for arthralgias, back pain, joint swelling, myalgias, neck pain and neck stiffness.  Neurological: Negative for weakness, numbness and headaches.  Hematological: Does not bruise/bleed easily.  Psychiatric/Behavioral: Negative for sleep disturbance. The patient is not nervous/anxious.      Objective: Vital Signs: BP 122/77   Pulse (!) 59   Resp 14   Ht 6' (1.829 m)   Wt 200 lb (90.7 kg)   BMI 27.12 kg/m   Physical Exam  Ortho Exam awake alert and oriented 3. Comfortable sitting position. Able to raise his left arm over his head but with a circuitous motion. Positive impingement testing. No pain at the acromioclavicular joint. Mild discomfort in the anterior subacromial region. Her pain with impingement and the lateral abduction position. Skin intact. Biceps intact.  Positive Tinel's left wrist. Skin intact. No swelling. Able to oppose thumb to little finger. Full range of motion of hand. No wrist pain.  Specialty Comments:  No  specialty comments available.  Imaging: No results found.   PMFS History: Patient Active Problem List   Diagnosis Date Noted  . Chronic renal insufficiency 06/07/2017  . Neck pain 12/31/2016  . Cataract 12/29/2016  . Medication management 10/19/2016  . Bradycardia 10/01/2016  . Preventative health care 10/01/2016  . Absence of bladder continence  07/30/2016  . TMJ arthralgia 01/02/2016  . Edema 07/25/2014  . Left knee pain 07/25/2014  . Abnormal EKG 04/01/2014  . Medicare annual wellness visit, subsequent 02/15/2014  . Restless leg syndrome 11/21/2012  . Elevated serum creatinine 06/27/2012  . Non-insulin treated type 2 diabetes mellitus ([place]) 06/27/2012  . ED (erectile dysfunction) 02/03/2012  . Elevated PSA 12/15/2011  . IRRITABLE BOWEL SYNDROME 12/29/2010  . BEN LOC HYPERPLASIA PROS W/O UR OBST & OTH LUTS 11/03/2009  . Urinary frequency 11/03/2009  . Gout 03/11/2009  . Vitamin D deficiency 10/27/2008  . Anemia 10/27/2008  . ATOPIC RHINITIS 10/27/2008  . LOW BACK PAIN, CHRONIC 06/25/2008  . Hypothyroidism 10/17/2007  . Hyperlipidemia 10/17/2007  . DEPRESSION 07/19/2007  . Essential hypertension 07/19/2007  . COLON CANCER, HX OF 07/19/2007   Past Medical History:  Diagnosis Date  . Allergic rhinitis   . Anxiety 2003   "after cancer surgery"  . Asthma    childhood per pt.  . Bradycardia 10/01/2016  . Cancer ([place])    colon; diagnosed 2003  . Cataract 12/29/2016   not sure which eye  . Chronic renal insufficiency 06/07/2017  . Depression   . Diabetes type 2, controlled ([place]) 06/27/2012  . Glaucoma   . Gout   . Hyperlipidemia   . Hypertension   . Medicare annual wellness visit, subsequent 02/15/2014  . Open-angle glaucoma 12/29/2016  . Sickle cell anemia ([place]) 06/12/2017   "they say I have a trace"  . TMJ arthralgia 01/02/2016    Family History  Problem Relation Age of Onset  . Diabetes Mother   . Heart disease Mother        MI  . Cancer Father        prostate  . Prostate cancer Father   . Prostate cancer Brother   . Colon cancer Neg Hx   . Stomach cancer Neg Hx   . Esophageal cancer Neg Hx   . Rectal cancer Neg Hx     Past Surgical History:  Procedure Laterality Date  . COLON SURGERY  2003   for colon cancer  . COLONOSCOPY    . INGUINAL HERNIA REPAIR  1997   right   Social History    Occupational History  .  Disabled   Social History Main Topics  . Smoking status: Never Smoker  . Smokeless tobacco: Never Used  . Alcohol use No  . Drug use: No  . Sexual activity: Not Currently

## 2017-08-07 ENCOUNTER — Ambulatory Visit: Payer: Medicare Other | Attending: Orthopaedic Surgery | Admitting: Physical Therapy

## 2017-08-07 DIAGNOSIS — R293 Abnormal posture: Secondary | ICD-10-CM | POA: Diagnosis present

## 2017-08-07 DIAGNOSIS — M6281 Muscle weakness (generalized): Secondary | ICD-10-CM | POA: Diagnosis present

## 2017-08-07 DIAGNOSIS — M25512 Pain in left shoulder: Secondary | ICD-10-CM | POA: Insufficient documentation

## 2017-08-07 DIAGNOSIS — G8929 Other chronic pain: Secondary | ICD-10-CM | POA: Diagnosis present

## 2017-08-07 NOTE — Patient Instructions (Signed)
Flexibility: Upper Trapezius Stretch   Gently grasp right side of head while reaching behind back with other hand. Tilt head away until a gentle stretch is felt. Hold __30__ seconds. Repeat __3__ times per set. Do __2__ sessions per day.  Scapular Retraction (Standing)   With arms at sides, pinch shoulder blades together. Repeat __15__ times per set. Do __2-3__ sessions per day.  Resistive Band Rowing   With resistive band anchored in door, grasp both ends. Keeping elbows bent, pull back, squeezing shoulder blades together. Hold __5__ seconds. Repeat _15___ times. Do __2__ sessions per day.  INTERNAL ROTATION: Standing - Stable: Exercise Band (Active)   Stand, right arm bent to 90, elbow against side, forearm out from body. Against yellow resistance band, rotate arm in to body, keeping elbow at side. Complete _2__ sets of _15__ repetitions.   EXTERNAL ROTATION: Standing - Stable: Exercise Band (Active)   Stand, right arm bent to 90, elbow against side, hand forward. Against yellow resistance band, rotate forearm outward, keeping elbow at side. Rotate forearm outward as far as possible. Complete _2__ sets of _15__ repetitions.

## 2017-08-07 NOTE — Therapy (Signed)
[facility] [address]  [place], [place], [address] Phone: [phone]   Fax:  [phone]  Physical Therapy Evaluation  Patient Details  Name: Daniel Gates MRN: [id number] Date of Birth: Apr 14, 1946 Referring Provider: Dr. Joni Fears  Encounter Date: 08/07/2017      PT End of Session - 08/07/17 1011    Visit Number 1   Number of Visits 12   Date for PT Re-Evaluation 09/18/17   Authorization Type [facility]   PT Start Time 0939   PT Stop Time 1012   PT Time Calculation (min) 33 min   Activity Tolerance Patient tolerated treatment well   Behavior During Therapy [facility] for tasks assessed/performed      Past Medical History:  Diagnosis Date  . Allergic rhinitis   . Anxiety 2003   "after cancer surgery"  . Asthma    childhood per pt.  . Bradycardia 10/01/2016  . Cancer ([place])    colon; diagnosed 2003  . Cataract 12/29/2016   not sure which eye  . Chronic renal insufficiency 06/07/2017  . Depression   . Diabetes type 2, controlled ([place]) 06/27/2012  . Glaucoma   . Gout   . Hyperlipidemia   . Hypertension   . Medicare annual wellness visit, subsequent 02/15/2014  . Open-angle glaucoma 12/29/2016  . Sickle cell anemia ([place]) 06/12/2017   "they say I have a trace"  . TMJ arthralgia 01/02/2016    Past Surgical History:  Procedure Laterality Date  . COLON SURGERY  2003   for colon cancer  . COLONOSCOPY    . [place]   right    There were no vitals filed for this visit.       Subjective Assessment - 08/07/17 0940    Subjective Patient reporting L arm numbness - started in December 2017, was helping neighbor furniture. Has noticed a loss of strength of L arm. Has pain at end ranges of available motion. Having nerve study on Friday   Diagnostic tests MRI: Rotator cuff tendinopathy without tear appears worst in the supraspinatus and infraspinatus. Moderate to moderately severe  acromioclavicular osteoarthritis. Os acromiale noted. Small volume of subacromial/subdeltoid fluid compatible with bursitis.   Patient Stated Goals "Get rid of this numbness in my hand"   Currently in Pain? No/denies   Pain Score --  2/10 with full flexion   Pain Orientation Left   Pain Descriptors / Indicators Sore;Numbness   Aggravating Factors  sleeping on L shoulder   Pain Relieving Factors medicine            [facility] PT Assessment - 08/07/17 0946      Assessment   Medical Diagnosis Chronic L shoulder pain   Referring Provider Dr. Joni Fears   Onset Date/Surgical Date --  11/2016   Hand Dominance Right   Next MD Visit prn   Prior Therapy yes - not for this issue     Precautions   Precautions None     Restrictions   Weight Bearing Restrictions No     Balance Screen   Has the patient fallen in the past 6 months No   Has the patient had a decrease in activity level because of a fear of falling?  No   Is the patient reluctant to leave their home because of a fear of falling?  No     Home Ec[employer] residence     Prior Function  Level of Independence Independent   Vocation Retired   Leisure fish     Cognition   Overall Cognitive Status Within Functional Limits for tasks assessed     Observation/Other Assessments   Focus on Therapeutic Outcomes (FOTO)  Shoulder: 49 (51% limited, predicted 34% limited)     Sensation   Light Touch Impaired Detail   Light Touch Impaired Details Impaired LUE   Additional Comments patient reporting "pins and needles" of entire L UE     Coordination   Gross Motor Movements are Fluid and Coordinated Yes     Posture/Postural Control   Posture/Postural Control Postural limitations   Postural Limitations Rounded Shoulders;Forward head     ROM / Strength   AROM / PROM / Strength AROM;Strength     AROM   Overall AROM Comments cervical AROM full in all planes - slight restrictions with R side bend  - patient reporitng tightness with all motions   AROM Assessment Site Shoulder;Cervical   Right/Left Shoulder Left   Left Shoulder Flexion 147 Degrees   Left Shoulder ABduction 163 Degrees   Left Shoulder Internal Rotation --  FIR to ~T10 with pain   Left Shoulder External Rotation --  FER to ~C4     Strength   Strength Assessment Site Shoulder;Hand   Right/Left Shoulder Right;Left   Right Shoulder Flexion 4/5   Right Shoulder ABduction 4/5   Right Shoulder Internal Rotation 4+/5   Right Shoulder External Rotation 4-/5   Left Shoulder Flexion 4-/5   Left Shoulder ABduction 4-/5   Left Shoulder Internal Rotation 4/5   Left Shoulder External Rotation 4-/5   Right/Left hand Right;Left   Right Hand Grip (lbs) 70   Left Hand Grip (lbs) 52     Palpation   Palpation comment TTP along anterior posterior shoulder complex, trigger points noted at infraspinatues and teres group that increases N&T into arm     Special Tests    Special Tests Rotator Cuff Impingement   Rotator Cuff Impingment tests Michel Bickers test;Empty Can test     Hawkins-Kennedy test   Findings Negative   Side Left     Empty Can test   Findings Negative   Side Left            Objective measurements completed on examination: See above findings.          [facility] Adult PT Treatment/Exercise - 08/07/17 0946      Exercises   Exercises Shoulder     Shoulder Exercises: Seated   Retraction Both;10 reps   Retraction Limitations 5 sec hold, VC to reduce UT involvement     Shoulder Exercises: Standing   External Rotation Strengthening;Left;15 reps;Theraband   Theraband Level (Shoulder External Rotation) Level 2 (Red)   Internal Rotation Strengthening;Left;15 reps;Theraband   Theraband Level (Shoulder Internal Rotation) Level 2 (Red)   Row Both;15 reps;Theraband   Theraband Level (Shoulder Row) Level 2 (Red)     Shoulder Exercises: Stretch   Other Shoulder Stretches L UT stretch - 3 x 30 seconds                 PT Education - 08/07/17 1009    Education provided Yes   Education Details exam findings, POC, HEP   Person(s) Educated Patient   Methods Explanation;Demonstration;Handout   Comprehension Verbalized understanding;Returned demonstration;Need further instruction          PT Short Term Goals - 08/07/17 1038      PT SHORT TERM GOAL #1  Title patient to be independent with initial HEP   Status New   Target Date 08/28/17           PT Long Term Goals - 08/07/17 1039      PT LONG TERM GOAL #1   Title patient to be independent with advanced HEP   Status New   Target Date 09/18/17     PT LONG TERM GOAL #2   Title patient to demonstrate full AROM of L shoulder without pain limiting   Status New   Target Date 09/18/17     PT LONG TERM GOAL #3   Title Patient to improve L shoulder strength to >/= 4+/5 without pain   Status New   Target Date 09/18/17     PT LONG TERM GOAL #4   Title patient to report reduction in L shoulder/arm pain/numbness by 50% for greater than 2 weeks   Status New   Target Date 09/18/17                Plan - 08/07/17 1022    Clinical Impression Statement Patient is a 71 y/o male presenting to [place] today for initial evaluation of L shoulder pain/L UE numbness. Patient with no known mechanism of injury other than heavy lifting approx 9 months ago. Patient today with slightly limited AROM of L shoulder with pain at end ranges of available motion, some strength deficits as compared to R UE, reduced grip strength, as well as poor postural awareness with activity. Patient with TTP along anterior and posterior shoulder ocmplex with trigger points noted in infraspinatus and teres group - would likely benefit from DN treatment to this area. Paitent given initial HEP for gentle stretching and strengthening with good carryover. Patient to benefit from PT to address functional limitations at L shoulder for improved QOL.    Clinical  Presentation Evolving   Clinical Presentation due to: history of colon cancer, Gout, HTN, radicular symptoms, no known mechanism of injury   Clinical Decision Making Moderate   Rehab Potential Good   PT Frequency 2x / week   PT Duration 6 weeks   PT Treatment/Interventions ADLs/Self Care Home Management;Cryotherapy;Electrical Stimulation;Iontophoresis 4mg /ml Dexamethasone;Moist Heat;Ultrasound;Neuromuscular re-education;Therapeutic exercise;Therapeutic activities;Patient/family education;Manual techniques;Passive range of motion;Vasopneumatic Device;Taping;Dry needling   Consulted and Agree with Plan of Care Patient      Patient will benefit from skilled therapeutic intervention in order to improve the following deficits and impairments:  Pain, Impaired UE functional use, Decreased strength, Decreased range of motion  Visit Diagnosis: Chronic left shoulder pain - Plan: PT plan of care cert/re-cert  Abnormal posture - Plan: PT plan of care cert/re-cert  Muscle weakness (generalized) - Plan: PT plan of care cert/re-cert      G-Codes - 59/56/38 1129    Functional Assessment Tool Used (Outpatient Only) FOTO: 49 (51% limited)    Functional Limitation Carrying, moving and handling objects   Carrying, Moving and Handling Objects Current Status (V5643) At least 40 percent but less than 60 percent impaired, limited or restricted   Carrying, Moving and Handling Objects Goal Status (P2951) At least 20 percent but less than 40 percent impaired, limited or restricted       Problem List Patient Active Problem List   Diagnosis Date Noted  . Chronic renal insufficiency 06/07/2017  . Neck pain 12/31/2016  . Cataract 12/29/2016  . Medication management 10/19/2016  . Bradycardia 10/01/2016  . Preventative health care 10/01/2016  . Absence of bladder continence 07/30/2016  . TMJ arthralgia 01/02/2016  .  Edema 07/25/2014  . Left knee pain 07/25/2014  . Abnormal EKG 04/01/2014  . Medicare  annual wellness visit, subsequent 02/15/2014  . Restless leg syndrome 11/21/2012  . Elevated serum creatinine 06/27/2012  . Non-insulin treated type 2 diabetes mellitus ([place]) 06/27/2012  . ED (erectile dysfunction) 02/03/2012  . Elevated PSA 12/15/2011  . IRRITABLE BOWEL SYNDROME 12/29/2010  . BEN LOC HYPERPLASIA PROS W/O UR OBST & OTH LUTS 11/03/2009  . Urinary frequency 11/03/2009  . Gout 03/11/2009  . Vitamin D deficiency 10/27/2008  . Anemia 10/27/2008  . ATOPIC RHINITIS 10/27/2008  . LOW BACK PAIN, CHRONIC 06/25/2008  . Hypothyroidism 10/17/2007  . Hyperlipidemia 10/17/2007  . DEPRESSION 07/19/2007  . Essential hypertension 07/19/2007  . COLON CANCER, HX OF 07/19/2007    Lanney Gins, PT, DPT 08/07/17 11:33 AM   [facility] [address]  [place], [place], [address] Phone: [phone]   Fax:  [phone]  Name: Daniel Gates MRN: [id number] Date of Birth: Jun 23, 1946

## 2017-08-09 ENCOUNTER — Ambulatory Visit: Payer: Medicare Other

## 2017-08-09 DIAGNOSIS — G8929 Other chronic pain: Secondary | ICD-10-CM

## 2017-08-09 DIAGNOSIS — M25512 Pain in left shoulder: Principal | ICD-10-CM

## 2017-08-09 DIAGNOSIS — R293 Abnormal posture: Secondary | ICD-10-CM

## 2017-08-09 DIAGNOSIS — M6281 Muscle weakness (generalized): Secondary | ICD-10-CM

## 2017-08-09 NOTE — Therapy (Signed)
[facility] [address]  [place], [place], [address] Phone: [phone]   Fax:  [phone]  Physical Therapy Treatment  Patient Details  Name: Daniel Gates MRN: [id number] Date of Birth: [birth year] Referring Provider: Dr. Joni Fears  Encounter Date: 08/09/2017      PT End of Session - 08/09/17 1455    Visit Number 2   Number of Visits 12   Date for PT Re-Evaluation 09/18/17   Authorization Type [facility] [employer]   PT Start Time 4580  pt. arrived late    PT Stop Time 1529   PT Time Calculation (min) 40 min   Activity Tolerance Patient tolerated treatment well   Behavior During Therapy [facility] for tasks assessed/performed      Past Medical History:  Diagnosis Date  . Allergic rhinitis   . Anxiety 2003   "after cancer surgery"  . Asthma    childhood per pt.  . Bradycardia 10/01/2016  . Cancer ([place])    colon; diagnosed 2003  . Cataract 12/29/2016   not sure which eye  . Chronic renal insufficiency 06/07/2017  . Depression   . Diabetes type 2, controlled ([place]) 06/27/2012  . Glaucoma   . Gout   . Hyperlipidemia   . Hypertension   . Medicare annual wellness visit, subsequent 02/15/2014  . Open-angle glaucoma 12/29/2016  . Sickle cell anemia ([place]) 06/12/2017   "they say I have a trace"  . TMJ arthralgia 01/02/2016    Past Surgical History:  Procedure Laterality Date  . COLON SURGERY  2003   for colon cancer  . COLONOSCOPY    . [place]   right    There were no vitals filed for this visit.      Subjective Assessment - 08/09/17 1452    Subjective Pt. doing well today noting pain free just numb in L arm.     Patient Stated Goals "Get rid of this numbness in my hand"   Currently in Pain? Yes   Pain Score 7    Pain Location Shoulder   Pain Orientation Left   Pain Descriptors / Indicators Numbness   Aggravating Factors  sleeping on L shoulder                          [facility] Adult PT Treatment/Exercise - 08/09/17 1501      Shoulder Exercises: Seated   Retraction Both;10 reps   Retraction Limitations VC to reduce UT involvement     Shoulder Exercises: Sidelying   External Rotation 15 reps   External Rotation Weight (lbs) 2   External Rotation Limitations towel under elbow    ABduction Left;15 reps   ABduction Limitations pain at ~ 95 dg abduction; pain free 0-90 dg      Shoulder Exercises: Standing   External Rotation Strengthening;Left;15 reps;Theraband   Theraband Level (Shoulder External Rotation) Level 2 (Red)   Internal Rotation Strengthening;Left;15 reps;Theraband   Theraband Level (Shoulder Internal Rotation) Level 2 (Red)   Row Both;15 reps;Theraband   Theraband Level (Shoulder Row) Level 2 (Red)     Shoulder Exercises: ROM/Strengthening   UBE (Upper Arm Bike) NuStep: lvl 1, 7 min   arm bike unavailable      Shoulder Exercises: Stretch   Other Shoulder Stretches L UT stretch x 30 seconds     Manual Therapy   Manual Therapy Soft tissue mobilization;Myofascial release   Manual therapy comments R  sidelying    Soft tissue mobilization STM to L posterior/inferior shoulder and L UT    Myofascial Release TPR to L inferior posterior shoulder and L UT over area of most tenderness; some improvement in tolerance as TPR progressed                   PT Short Term Goals - 08/09/17 1456      PT SHORT TERM GOAL #1   Title patient to be independent with initial HEP   Status On-going           PT Long Term Goals - 08/09/17 1456      PT LONG TERM GOAL #1   Title patient to be independent with advanced HEP   Status On-going     PT LONG TERM GOAL #2   Title patient to demonstrate full AROM of L shoulder without pain limiting   Status On-going     PT LONG TERM GOAL #3   Title Patient to improve L shoulder strength to >/= 4+/5 without pain   Status On-going     PT LONG TERM GOAL #4    Title patient to report reduction in L shoulder/arm pain/numbness by 50% for greater than 2 weeks   Status On-going               Plan - 08/09/17 1456    Clinical Impression Statement Pt. pain free following mowing yard earlier today.  Primary complaint is L arm numbness today.  Needed some cueing with HEP review today to decrease UT involvement and to prevent trunk rotation with shoulder ER, IR.  Tolerated all strengthening activities well today.  Still very tender in L infraspinatus and UT today however good overall tolerance for STM/TPR.  Will continue to progress per pt. tolerance in coming visits.     PT Treatment/Interventions ADLs/Self Care Home Management;Cryotherapy;Electrical Stimulation;Iontophoresis 4mg /ml Dexamethasone;Moist Heat;Ultrasound;Neuromuscular re-education;Therapeutic exercise;Therapeutic activities;Patient/family education;Manual techniques;Passive range of motion;Vasopneumatic Device;Taping;Dry needling      Patient will benefit from skilled therapeutic intervention in order to improve the following deficits and impairments:  Pain, Impaired UE functional use, Decreased strength, Decreased range of motion  Visit Diagnosis: Chronic left shoulder pain  Abnormal posture  Muscle weakness (generalized)     Problem List Patient Active Problem List   Diagnosis Date Noted  . Chronic renal insufficiency 06/07/2017  . Neck pain 12/31/2016  . Cataract 12/29/2016  . Medication management 10/19/2016  . Bradycardia 10/01/2016  . Preventative health care 10/01/2016  . Absence of bladder continence 07/30/2016  . TMJ arthralgia 01/02/2016  . Edema 07/25/2014  . Left knee pain 07/25/2014  . Abnormal EKG 04/01/2014  . Medicare annual wellness visit, subsequent 02/15/2014  . Restless leg syndrome 11/21/2012  . Elevated serum creatinine 06/27/2012  . Non-insulin treated type 2 diabetes mellitus ([place]) 06/27/2012  . ED (erectile dysfunction) 02/03/2012  . Elevated  PSA 12/15/2011  . IRRITABLE BOWEL SYNDROME 12/29/2010  . BEN LOC HYPERPLASIA PROS W/O UR OBST & OTH LUTS 11/03/2009  . Urinary frequency 11/03/2009  . Gout 03/11/2009  . Vitamin D deficiency 10/27/2008  . Anemia 10/27/2008  . ATOPIC RHINITIS 10/27/2008  . LOW BACK PAIN, CHRONIC 06/25/2008  . Hypothyroidism 10/17/2007  . Hyperlipidemia 10/17/2007  . DEPRESSION 07/19/2007  . Essential hypertension 07/19/2007  . COLON CANCER, HX OF 07/19/2007    Bess Harvest, PTA 08/09/17 4:11 PM  [facility] [place], [place], [address] Phone:  [phone]   Fax:  [phone]  Name: MIHCAEL LEDEE MRN: [id number] Date of Birth: 1946/04/08

## 2017-08-10 ENCOUNTER — Encounter (INDEPENDENT_AMBULATORY_CARE_PROVIDER_SITE_OTHER): Payer: Self-pay | Admitting: Physical Medicine and Rehabilitation

## 2017-08-10 ENCOUNTER — Ambulatory Visit (INDEPENDENT_AMBULATORY_CARE_PROVIDER_SITE_OTHER): Payer: Medicare Other | Admitting: Physical Medicine and Rehabilitation

## 2017-08-10 DIAGNOSIS — R202 Paresthesia of skin: Secondary | ICD-10-CM

## 2017-08-10 NOTE — Progress Notes (Deleted)
Numbness in left arm and hand. All fingers seem the same. Worse at night. Right hand dominant. Loss of grip in left hand.

## 2017-08-14 ENCOUNTER — Ambulatory Visit: Payer: Medicare Other | Admitting: Physical Therapy

## 2017-08-14 NOTE — Procedures (Signed)
EMG & NCV Findings: Evaluation of the left median (across palm) sensory nerve showed prolonged distal peak latency (Wrist, 4.0 ms) and prolonged distal peak latency (Palm, 2.3 ms).  The left ulnar sensory nerve showed prolonged distal peak latency (3.9 ms) and decreased conduction velocity (Wrist-5th Digit, 36 m/s).  All remaining nerves (as indicated in the following tables) were within normal limits.    All examined muscles (as indicated in the following table) showed no evidence of electrical instability.    Impression: The above electrodiagnostic study is ABNORMAL and reveals evidence of a very mild left median nerve slowing at the wrist  and there is borderline slowing of the ulnar sensory nerve at the wrist. This most likely represents a very early sensory predominant peripheral polyneuropathy neuropathy given the patient's diabetes history but this could be temperature/technical artifact in both cases. I cannot rule out an entrapment neuropathy of both the median and ulnar nerves at the wrist.   There is no significant electrodiagnostic evidence of any other focal nerve entrapment, brachial plexopathy or cervical radiculopathy.  As you know, this particular electrodiagnostic study cannot rule out chemical radiculitis or sensory only radiculopathy.  This electrodiagnostic study cannot rule out small fiber polyneuropathy and dysesthesias from central pain sensitization syndromes such as fibromyalgia.  Recommendations: 1.  Follow-up with referring physician. 2.  Continue current management of symptoms.    Nerve Conduction Studies Anti Sensory Summary Table   Stim Site NR Peak (ms) Norm Peak (ms) P-T Amp (V) Norm P-T Amp Site1 Site2 Delta-P (ms) Dist (cm) Vel (m/s) Norm Vel (m/s)  Left Median Acr Palm Anti Sensory (2nd Digit)  32.2C  Wrist    *4.0 <3.6 18.9 >10 Wrist Palm 1.7 0.0    Palm    *2.3 <2.0 8.9         Left Radial Anti Sensory (Base 1st Digit)  32.7C  Wrist    2.4 <3.1  21.5  Wrist Base 1st Digit 2.4 0.0    Left Ulnar Anti Sensory (5th Digit)  32.4C  Wrist    *3.9 <3.7 19.4 >15.0 Wrist 5th Digit 3.9 14.0 *36 >38   Motor Summary Table   Stim Site NR Onset (ms) Norm Onset (ms) O-P Amp (mV) Norm O-P Amp Site1 Site2 Delta-0 (ms) Dist (cm) Vel (m/s) Norm Vel (m/s)  Left Median Motor (Abd Poll Brev)  32.5C  Wrist    4.0 <4.2 9.1 >5 Elbow Wrist 4.8 24.0 50 >50  Elbow    8.8  5.6         Left Ulnar Motor (Abd Dig Min)  32.6C  Wrist    3.3 <4.2 10.5 >3 B Elbow Wrist 4.1 23.5 57 >53  B Elbow    7.4  10.4  A Elbow B Elbow 1.9 11.0 58 >53  A Elbow    9.3  10.3          EMG   Side Muscle Nerve Root Ins Act Fibs Psw Amp Dur Poly Recrt Int Fraser Din Comment  Left Abd Poll Brev Median C8-T1 Nml Nml Nml Nml Nml 0 Nml Nml   Left 1stDorInt Ulnar C8-T1 Nml Nml Nml Nml Nml 0 Nml Nml   Left PronatorTeres Median C6-7 Nml Nml Nml Nml Nml 0 Nml Nml   Left Biceps Musculocut C5-6 Nml Nml Nml Nml Nml 0 Nml Nml   Left Deltoid Axillary C5-6 Nml Nml Nml Nml Nml 0 Nml Nml     Nerve Conduction Studies Anti Sensory Left/Right Comparison   Stim  Site L Lat (ms) R Lat (ms) L-R Lat (ms) L Amp (V) R Amp (V) L-R Amp (%) Site1 Site2 L Vel (m/s) R Vel (m/s) L-R Vel (m/s)  Median Acr Palm Anti Sensory (2nd Digit)  32.2C  Wrist *4.0   18.9   Wrist Palm     Palm *2.3   8.9         Radial Anti Sensory (Base 1st Digit)  32.7C  Wrist 2.4   21.5   Wrist Base 1st Digit     Ulnar Anti Sensory (5th Digit)  32.4C  Wrist *3.9   19.4   Wrist 5th Digit *36     Motor Left/Right Comparison   Stim Site L Lat (ms) R Lat (ms) L-R Lat (ms) L Amp (mV) R Amp (mV) L-R Amp (%) Site1 Site2 L Vel (m/s) R Vel (m/s) L-R Vel (m/s)  Median Motor (Abd Poll Brev)  32.5C  Wrist 4.0   9.1   Elbow Wrist 50    Elbow 8.8   5.6         Ulnar Motor (Abd Dig Min)  32.6C  Wrist 3.3   10.5   B Elbow Wrist 57    B Elbow 7.4   10.4   A Elbow B Elbow 58    A Elbow 9.3   10.3            Waveforms:

## 2017-08-14 NOTE — Progress Notes (Signed)
Daniel Gates - 71 y.o. male MRN [id number]  Date of birth: 1946-09-07  Office Visit Note: Visit Date: 08/10/2017 PCP: [name], MD Referred by: [name], MD  Subjective: Chief Complaint  Patient presents with  . Left Arm - Numbness   HPI: Daniel Gates is a 71 year old right-hand dominant gentleman with history of worsening chronic shoulder pain as well as tingling in the left hand which is worse at night. He does have a positive flick sign. He reports all his fingers feel the same in terms of the tingling numbness feeling. He also reports loss of grip strength in the hand. Dr. Durward Fortes is been following with conservative care. There is an MRI of his shoulder but not of the cervical spine. He's had no prior cervical spine injury or MRI or surgery. He denies any right-sided symptoms. He has not had prior electrodiagnostic studies. His case is,. By diabetes, depression, irritable bowel syndrome and TMJ.    ROS Otherwise per HPI.  Assessment & Plan: Visit Diagnoses:  1. Paresthesia of skin     Plan: No additional findings.  Impression: The above electrodiagnostic study is ABNORMAL and reveals evidence of a very mild left median nerve slowing at the wrist  and there is borderline slowing of the ulnar sensory nerve at the wrist. This most likely represents a very early sensory predominant peripheral polyneuropathy neuropathy given the patient's diabetes history but this could be temperature/technical artifact in both cases. I cannot rule out an entrapment neuropathy of both the median and ulnar nerves at the wrist.  There is no significant electrodiagnostic evidence of any other focal nerve entrapment, brachial plexopathy or cervical radiculopathy.  As you know, this particular electrodiagnostic study cannot rule out chemical radiculitis or sensory only radiculopathy.  This electrodiagnostic study cannot rule out small fiber polyneuropathy and dysesthesias from central  pain sensitization syndromes such as fibromyalgia.  Recommendations: 1.  Follow-up with referring physician. 2.  Continue current management of symptoms.  Meds & Orders: No orders of the defined types were placed in this encounter.   Orders Placed This Encounter  Procedures  . NCV with EMG (electromyography)    Follow-up: Return for Dr. Durward Fortes.   Procedures: No procedures performed  EMG & NCV Findings: Evaluation of the left median (across palm) sensory nerve showed prolonged distal peak latency (Wrist, 4.0 ms) and prolonged distal peak latency (Palm, 2.3 ms).  The left ulnar sensory nerve showed prolonged distal peak latency (3.9 ms) and decreased conduction velocity (Wrist-5th Digit, 36 m/s).  All remaining nerves (as indicated in the following tables) were within normal limits.    All examined muscles (as indicated in the following table) showed no evidence of electrical instability.    Impression: The above electrodiagnostic study is ABNORMAL and reveals evidence of a very mild left median nerve slowing at the wrist  and there is borderline slowing of the ulnar sensory nerve at the wrist. This most likely represents a very early sensory predominant peripheral polyneuropathy neuropathy given the patient's diabetes history but this could be temperature/technical artifact in both cases. I cannot rule out an entrapment neuropathy of both the median and ulnar nerves at the wrist.   There is no significant electrodiagnostic evidence of any other focal nerve entrapment, brachial plexopathy or cervical radiculopathy.  As you know, this particular electrodiagnostic study cannot rule out chemical radiculitis or sensory only radiculopathy.  This electrodiagnostic study cannot rule out small fiber polyneuropathy and dysesthesias from central pain sensitization  syndromes such as fibromyalgia.  Recommendations: 1.  Follow-up with referring physician. 2.  Continue current management of  symptoms.    Nerve Conduction Studies Anti Sensory Summary Table   Stim Site NR Peak (ms) Norm Peak (ms) P-T Amp (V) Norm P-T Amp Site1 Site2 Delta-P (ms) Dist (cm) Vel (m/s) Norm Vel (m/s)  Left Median Acr Palm Anti Sensory (2nd Digit)  32.2C  Wrist    *4.0 <3.6 18.9 >10 Wrist Palm 1.7 0.0    Palm    *2.3 <2.0 8.9         Left Radial Anti Sensory (Base 1st Digit)  32.7C  Wrist    2.4 <3.1 21.5  Wrist Base 1st Digit 2.4 0.0    Left Ulnar Anti Sensory (5th Digit)  32.4C  Wrist    *3.9 <3.7 19.4 >15.0 Wrist 5th Digit 3.9 14.0 *36 >38   Motor Summary Table   Stim Site NR Onset (ms) Norm Onset (ms) O-P Amp (mV) Norm O-P Amp Site1 Site2 Delta-0 (ms) Dist (cm) Vel (m/s) Norm Vel (m/s)  Left Median Motor (Abd Poll Brev)  32.5C  Wrist    4.0 <4.2 9.1 >5 Elbow Wrist 4.8 24.0 50 >50  Elbow    8.8  5.6         Left Ulnar Motor (Abd Dig Min)  32.6C  Wrist    3.3 <4.2 10.5 >3 B Elbow Wrist 4.1 23.5 57 >53  B Elbow    7.4  10.4  A Elbow B Elbow 1.9 11.0 58 >53  A Elbow    9.3  10.3          EMG   Side Muscle Nerve Root Ins Act Fibs Psw Amp Dur Poly Recrt Int Fraser Din Comment  Left Abd Poll Brev Median C8-T1 Nml Nml Nml Nml Nml 0 Nml Nml   Left 1stDorInt Ulnar C8-T1 Nml Nml Nml Nml Nml 0 Nml Nml   Left PronatorTeres Median C6-7 Nml Nml Nml Nml Nml 0 Nml Nml   Left Biceps Musculocut C5-6 Nml Nml Nml Nml Nml 0 Nml Nml   Left Deltoid Axillary C5-6 Nml Nml Nml Nml Nml 0 Nml Nml     Nerve Conduction Studies Anti Sensory Left/Right Comparison   Stim Site L Lat (ms) R Lat (ms) L-R Lat (ms) L Amp (V) R Amp (V) L-R Amp (%) Site1 Site2 L Vel (m/s) R Vel (m/s) L-R Vel (m/s)  Median Acr Palm Anti Sensory (2nd Digit)  32.2C  Wrist *4.0   18.9   Wrist Palm     Palm *2.3   8.9         Radial Anti Sensory (Base 1st Digit)  32.7C  Wrist 2.4   21.5   Wrist Base 1st Digit     Ulnar Anti Sensory (5th Digit)  32.4C  Wrist *3.9   19.4   Wrist 5th Digit *36     Motor Left/Right Comparison   Stim  Site L Lat (ms) R Lat (ms) L-R Lat (ms) L Amp (mV) R Amp (mV) L-R Amp (%) Site1 Site2 L Vel (m/s) R Vel (m/s) L-R Vel (m/s)  Median Motor (Abd Poll Brev)  32.5C  Wrist 4.0   9.1   Elbow Wrist 50    Elbow 8.8   5.6         Ulnar Motor (Abd Dig Min)  32.6C  Wrist 3.3   10.5   B Elbow Wrist 57    B Elbow 7.4   10.4  A Elbow B Elbow 58    A Elbow 9.3   10.3            Waveforms:            Clinical History: No specialty comments available.  He reports that he has never smoked. He has never used smokeless tobacco.   Recent Labs  09/28/16 1217 06/07/17 1622  LABURIC 5.0 6.3    Objective:  VS:  HT:    WT:   BMI:     BP:   HR: bpm  TEMP: ( )  RESP:  Physical Exam  Musculoskeletal:  Inspection reveals no atrophy of the bilateral APB or FDI or hand intrinsics. There is no swelling, color changes, allodynia or dystrophic changes. There is 5 out of 5 strength in the bilateral wrist extension, finger abduction and long finger flexion. There is intact sensation to light touch in all dermatomal and peripheral nerve distributions. There is a negative Tinel's test at the bilateral wrist and elbow. There is a negative Hoffmann's test bilaterally.    Ortho Exam Imaging: No results found.  Past Medical/Family/Surgical/Social History: Medications & Allergies reviewed per EMR Patient Active Problem List   Diagnosis Date Noted  . Chronic renal insufficiency 06/07/2017  . Neck pain 12/31/2016  . Cataract 12/29/2016  . Medication management 10/19/2016  . Bradycardia 10/01/2016  . Preventative health care 10/01/2016  . Absence of bladder continence 07/30/2016  . TMJ arthralgia 01/02/2016  . Edema 07/25/2014  . Left knee pain 07/25/2014  . Abnormal EKG 04/01/2014  . Medicare annual wellness visit, subsequent 02/15/2014  . Restless leg syndrome 11/21/2012  . Elevated serum creatinine 06/27/2012  . Non-insulin treated type 2 diabetes mellitus ([place]) 06/27/2012  . ED (erectile  dysfunction) 02/03/2012  . Elevated PSA 12/15/2011  . IRRITABLE BOWEL SYNDROME 12/29/2010  . BEN LOC HYPERPLASIA PROS W/O UR OBST & OTH LUTS 11/03/2009  . Urinary frequency 11/03/2009  . Gout 03/11/2009  . Vitamin D deficiency 10/27/2008  . Anemia 10/27/2008  . ATOPIC RHINITIS 10/27/2008  . LOW BACK PAIN, CHRONIC 06/25/2008  . Hypothyroidism 10/17/2007  . Hyperlipidemia 10/17/2007  . DEPRESSION 07/19/2007  . Essential hypertension 07/19/2007  . COLON CANCER, HX OF 07/19/2007   Past Medical History:  Diagnosis Date  . Allergic rhinitis   . Anxiety 2003   "after cancer surgery"  . Asthma    childhood per pt.  . Bradycardia 10/01/2016  . Cancer ([place])    colon; diagnosed 2003  . Cataract 12/29/2016   not sure which eye  . Chronic renal insufficiency 06/07/2017  . Depression   . Diabetes type 2, controlled ([place]) 06/27/2012  . Glaucoma   . Gout   . Hyperlipidemia   . Hypertension   . Medicare annual wellness visit, subsequent 02/15/2014  . Open-angle glaucoma 12/29/2016  . Sickle cell anemia ([place]) 06/12/2017   "they say I have a trace"  . TMJ arthralgia 01/02/2016   Family History  Problem Relation Age of Onset  . Diabetes Mother   . Heart disease Mother        MI  . Cancer Father        prostate  . Prostate cancer Father   . Prostate cancer Brother   . Colon cancer Neg Hx   . Stomach cancer Neg Hx   . Esophageal cancer Neg Hx   . Rectal cancer Neg Hx    Past Surgical History:  Procedure Laterality Date  . COLON SURGERY  2003  for colon cancer  . COLONOSCOPY    . INGUINAL HERNIA REPAIR  1997   right   Social History   Occupational History  .  Disabled   Social History Main Topics  . Smoking status: Never Smoker  . Smokeless tobacco: Never Used  . Alcohol use No  . Drug use: No  . Sexual activity: Not Currently

## 2017-08-16 ENCOUNTER — Other Ambulatory Visit: Payer: Self-pay | Admitting: Family Medicine

## 2017-08-16 ENCOUNTER — Other Ambulatory Visit: Payer: Self-pay | Admitting: Cardiology

## 2017-08-16 DIAGNOSIS — E118 Type 2 diabetes mellitus with unspecified complications: Secondary | ICD-10-CM

## 2017-08-16 DIAGNOSIS — E559 Vitamin D deficiency, unspecified: Secondary | ICD-10-CM

## 2017-08-16 DIAGNOSIS — D649 Anemia, unspecified: Secondary | ICD-10-CM

## 2017-08-16 DIAGNOSIS — M109 Gout, unspecified: Secondary | ICD-10-CM

## 2017-08-16 DIAGNOSIS — N529 Male erectile dysfunction, unspecified: Secondary | ICD-10-CM

## 2017-08-16 DIAGNOSIS — R972 Elevated prostate specific antigen [PSA]: Secondary | ICD-10-CM

## 2017-08-16 DIAGNOSIS — G2581 Restless legs syndrome: Secondary | ICD-10-CM

## 2017-08-17 ENCOUNTER — Ambulatory Visit: Payer: Medicare Other

## 2017-08-20 ENCOUNTER — Other Ambulatory Visit: Payer: Self-pay | Admitting: Family Medicine

## 2017-08-21 ENCOUNTER — Ambulatory Visit: Payer: Medicare Other

## 2017-08-24 ENCOUNTER — Ambulatory Visit: Payer: Medicare Other | Admitting: Physical Therapy

## 2017-08-28 ENCOUNTER — Ambulatory Visit: Payer: Medicare Other | Admitting: Physical Therapy

## 2017-08-29 ENCOUNTER — Other Ambulatory Visit: Payer: Self-pay | Admitting: Family Medicine

## 2017-08-29 DIAGNOSIS — M109 Gout, unspecified: Secondary | ICD-10-CM

## 2017-08-29 DIAGNOSIS — D649 Anemia, unspecified: Secondary | ICD-10-CM

## 2017-08-29 DIAGNOSIS — E118 Type 2 diabetes mellitus with unspecified complications: Secondary | ICD-10-CM

## 2017-08-29 DIAGNOSIS — G2581 Restless legs syndrome: Secondary | ICD-10-CM

## 2017-08-29 DIAGNOSIS — R972 Elevated prostate specific antigen [PSA]: Secondary | ICD-10-CM

## 2017-08-29 DIAGNOSIS — N529 Male erectile dysfunction, unspecified: Secondary | ICD-10-CM

## 2017-08-29 DIAGNOSIS — E559 Vitamin D deficiency, unspecified: Secondary | ICD-10-CM

## 2017-08-31 ENCOUNTER — Ambulatory Visit: Payer: Medicare Other | Admitting: Physical Therapy

## 2017-08-31 DIAGNOSIS — M25512 Pain in left shoulder: Secondary | ICD-10-CM | POA: Diagnosis not present

## 2017-08-31 DIAGNOSIS — G8929 Other chronic pain: Secondary | ICD-10-CM

## 2017-08-31 DIAGNOSIS — R293 Abnormal posture: Secondary | ICD-10-CM

## 2017-08-31 DIAGNOSIS — M6281 Muscle weakness (generalized): Secondary | ICD-10-CM

## 2017-08-31 NOTE — Therapy (Addendum)
[facility] [address]  [place], [place], [address] Phone: [phone]   Fax:  [phone]  Physical Therapy Treatment  Patient Details  Name: Daniel Gates MRN: [id number] Date of Birth: [birth year] Referring Provider: Dr. Joni Fears  Encounter Date: 08/31/2017      PT End of Session - 08/31/17 1101    Visit Number 3   Number of Visits 12   Date for PT Re-Evaluation 09/18/17   Authorization Type [facility]   PT Start Time 5400   PT Stop Time 1137   PT Time Calculation (min) 39 min   Activity Tolerance Patient tolerated treatment well   Behavior During Therapy [facility] for tasks assessed/performed      Past Medical History:  Diagnosis Date  . Allergic rhinitis   . Anxiety 2003   "after cancer surgery"  . Asthma    childhood per pt.  . Bradycardia 10/01/2016  . Cancer ([place])    colon; diagnosed 2003  . Cataract 12/29/2016   not sure which eye  . Chronic renal insufficiency 06/07/2017  . Depression   . Diabetes type 2, controlled ([place]) 06/27/2012  . Glaucoma   . Gout   . Hyperlipidemia   . Hypertension   . Medicare annual wellness visit, subsequent 02/15/2014  . Open-angle glaucoma 12/29/2016  . Sickle cell anemia ([place]) 06/12/2017   "they say I have a trace"  . TMJ arthralgia 01/02/2016    Past Surgical History:  Procedure Laterality Date  . COLON SURGERY  2003   for colon cancer  . COLONOSCOPY    . [place]   right    There were no vitals filed for this visit.      Subjective Assessment - 08/31/17 1100    Subjective "I'm feeling about the same"   Diagnostic tests MRI: Rotator cuff tendinopathy without tear appears worst in the supraspinatus and infraspinatus. Moderate to moderately severe acromioclavicular osteoarthritis. Os acromiale noted. Small volume of subacromial/subdeltoid fluid compatible with bursitis.   Patient Stated Goals "Get rid of this numbness in my  hand"   Currently in Pain? Yes   Pain Score 3    Pain Location Arm   Pain Orientation Left   Pain Descriptors / Indicators Aching;Numbness                         [facility] Adult PT Treatment/Exercise - 08/31/17 1102      Shoulder Exercises: Standing   External Rotation Strengthening;Left;15 reps;Theraband   Theraband Level (Shoulder External Rotation) Level 2 (Red)   Internal Rotation Strengthening;Left;15 reps;Theraband   Theraband Level (Shoulder Internal Rotation) Level 2 (Red)   Flexion Strengthening;Both;15 reps;Weights   Shoulder Flexion Weight (lbs) 2   ABduction Strengthening;Both;15 reps;Weights   Shoulder ABduction Weight (lbs) 2   Extension Strengthening;Both;15 reps;Theraband   Theraband Level (Shoulder Extension) Level 2 (Red)   Extension Limitations with scap squeeze     Shoulder Exercises: ROM/Strengthening   UBE (Upper Arm Bike) L2 x 6 min (3/3)   Cybex Row 15 reps   Cybex Row Limitations 25# narrow grip   Wall Pushups 15 reps   Other ROM/Strengthening Exercises median nerve glide 2 x 10; ulnar nerve glide x 10   Other ROM/Strengthening Exercises BATCA pulldown 20# x 15; BATCA serratus punch 15# x 15                  PT Short Term  Goals - 08/09/17 1456      PT SHORT TERM GOAL #1   Title patient to be independent with initial HEP   Status On-going           PT Long Term Goals - 08/09/17 1456      PT LONG TERM GOAL #1   Title patient to be independent with advanced HEP   Status On-going     PT LONG TERM GOAL #2   Title patient to demonstrate full AROM of L shoulder without pain limiting   Status On-going     PT LONG TERM GOAL #3   Title Patient to improve L shoulder strength to >/= 4+/5 without pain   Status On-going     PT LONG TERM GOAL #4   Title patient to report reduction in L shoulder/arm pain/numbness by 50% for greater than 2 weeks   Status On-going               Plan - 08/31/17 1101    Clinical  Impression Statement Patient today with continued reports of N&T into hand. PT reviewing EMG studies with MD thinking median and ulnar nerve likely involved, therefore given ulnar and median nerve glides for HEP today with good carryover. patient doing well with all strengthening otherwise.    PT Treatment/Interventions ADLs/Self Care Home Management;Cryotherapy;Electrical Stimulation;Iontophoresis 36m/ml Dexamethasone;Moist Heat;Ultrasound;Neuromuscular re-education;Therapeutic exercise;Therapeutic activities;Patient/family education;Manual techniques;Passive range of motion;Vasopneumatic Device;Taping;Dry needling   Consulted and Agree with Plan of Care Patient      Patient will benefit from skilled therapeutic intervention in order to improve the following deficits and impairments:  Pain, Impaired UE functional use, Decreased strength, Decreased range of motion  Visit Diagnosis: Chronic left shoulder pain  Abnormal posture  Muscle weakness (generalized)     G-Codes   Functional Assessment Tool Used (Outpatient Only) Clinical judgement   Functional Limitation Carrying, moving and handling objects   Carrying, Moving and Handling Objects Goal Status (([id number] At least 20 percent but less than 40 percent impaired, limited or restricted   Carrying, Moving and Handling Objects Discharge Status [phone] At least 40 percent but less than 60 percent impaired, limited or restricted     Problem List Patient Active Problem List   Diagnosis Date Noted  . Chronic renal insufficiency 06/07/2017  . Neck pain 12/31/2016  . Cataract 12/29/2016  . Medication management 10/19/2016  . Bradycardia 10/01/2016  . Preventative health care 10/01/2016  . Absence of bladder continence 07/30/2016  . TMJ arthralgia 01/02/2016  . Edema 07/25/2014  . Left knee pain 07/25/2014  . Abnormal EKG 04/01/2014  . Medicare annual wellness visit, subsequent 02/15/2014  . Restless leg syndrome 11/21/2012  .  Elevated serum creatinine 06/27/2012  . Non-insulin treated type 2 diabetes mellitus ([place] 06/27/2012  . ED (erectile dysfunction) 02/03/2012  . Elevated PSA 12/15/2011  . IRRITABLE BOWEL SYNDROME 12/29/2010  . BEN LOC HYPERPLASIA PROS W/O UR OBST & OTH LUTS 11/03/2009  . Urinary frequency 11/03/2009  . Gout 03/11/2009  . Vitamin D deficiency 10/27/2008  . Anemia 10/27/2008  . ATOPIC RHINITIS 10/27/2008  . LOW BACK PAIN, CHRONIC 06/25/2008  . Hypothyroidism 10/17/2007  . Hyperlipidemia 10/17/2007  . DEPRESSION 07/19/2007  . Essential hypertension 07/19/2007  . COLON CANCER, HX OF 07/19/2007     SLanney Gins PT, DPT 08/31/17 11:42 AM   PHYSICAL THERAPY DISCHARGE SUMMARY  Visits from Start of Care: 3  Current functional level related to goals / functional outcomes: See above   Remaining deficits:  See above; continued N&T into hand   Education / Equipment: HEP  Plan: Patient agrees to discharge.  Patient goals were not met. Patient is being discharged due to financial reasons.  ?????    Patient requesting d/c due to financial constraints. Will gladly see patient in the future for any other needs.  Lanney Gins, PT, DPT 10/29/17 8:44 AM  [facility] [address]  [place], [place], [address] Phone: [phone]   Fax:  [phone]  Name: Daniel Gates MRN: [id number] Date of Birth: 09-05-1946

## 2017-09-05 ENCOUNTER — Ambulatory Visit: Payer: Medicare Other

## 2017-09-07 ENCOUNTER — Encounter (INDEPENDENT_AMBULATORY_CARE_PROVIDER_SITE_OTHER): Payer: Self-pay | Admitting: Orthopaedic Surgery

## 2017-09-07 ENCOUNTER — Ambulatory Visit (INDEPENDENT_AMBULATORY_CARE_PROVIDER_SITE_OTHER): Payer: Medicare Other | Admitting: Orthopaedic Surgery

## 2017-09-07 VITALS — BP 126/79 | HR 59 | Resp 14 | Ht 72.0 in | Wt 200.0 lb

## 2017-09-07 DIAGNOSIS — G5602 Carpal tunnel syndrome, left upper limb: Secondary | ICD-10-CM | POA: Diagnosis not present

## 2017-09-07 NOTE — Progress Notes (Signed)
Office Visit Note   Patient: Daniel Gates           Date of Birth: 10-Feb-1946           MRN: [id number] Visit Date: 09/07/2017              Requested by: [name], MD [address] [place], [place] [address] PCP: [name], MD   Assessment & Plan: Visit Diagnoses:  1. Carpal tunnel syndrome, left upper limb     Plan: EMGs and nerve conduction studies of the left upper extremity revealed a very mild left median nerve slowing at the wrist and borderline slowing of the ulnar sensory nerve at the wrist. This most likely represents a very early sensory predominantly peripheral polyneuropathy probably related to the patient's diabetes. There is certainly  a possibility of an entrapment neuropathy but mild. Long discussion regarding the possibilities of diagnosis and treatment options. Daniel Gates would like to try a volar wrist splint.  See back in a month if no improvement to consider cortisone injection of the median nerve  Follow-Up Instructions: Return if symptoms worsen or fail to improve.   Orders:  No orders of the defined types were placed in this encounter.  No orders of the defined types were placed in this encounter.     Procedures: No procedures performed   Clinical Data: No additional findings.   Subjective: Chief Complaint  Patient presents with  . Left Wrist - Results    Daniel Gates is a 71 y o here for Post ENG/[place] results  Has been experiencing some numbness tingling and a cool feeling of his left hand. He's have numbness in all of his fingers. Denies any shoulder pain elbow discomfort or discomfort with the cervical spine. EMGs and nerve conduction studies were performed and the results are as above. He has had some trouble sleeping at night related to his hand with numbness and tingling. Functionally he is not been compromised  HPI  Review of Systems  Constitutional: Negative for fatigue.  HENT: Negative for hearing loss.     Respiratory: Negative for apnea, chest tightness and shortness of breath.   Cardiovascular: Negative for chest pain, palpitations and leg swelling.  Gastrointestinal: Negative for blood in stool, constipation and diarrhea.  Genitourinary: Negative for difficulty urinating.  Musculoskeletal: Negative for arthralgias, back pain, joint swelling, myalgias, neck pain and neck stiffness.  Neurological: Negative for weakness, numbness and headaches.  Hematological: Does not bruise/bleed easily.  Psychiatric/Behavioral: Negative for sleep disturbance. The patient is not nervous/anxious.      Objective: Vital Signs: BP 126/79   Pulse (!) 59   Resp 14   Ht 6' (1.829 m)   Wt 200 lb (90.7 kg)   BMI 27.12 kg/m   Physical Exam  Ortho Exam awake alert and oriented 3. Comfortable sitting. Full range of motion of left hand and fingers. Positive Tinel's over the median nerve left wrist. No pain or Tinel's over the ulnar nerve. Normal abduction and abduction of fingers. Good opposition of thumb to little finger with good strength no pain with range of motion of cervical spine. No pain range of motion of left shoulder or elbow. Good sensibility to the tips of all the fingers  Specialty Comments:  No specialty comments available.  Imaging: No results found.   PMFS History: Patient Active Problem List   Diagnosis Date Noted  . Chronic renal insufficiency 06/07/2017  . Neck pain 12/31/2016  .  Cataract 12/29/2016  . Medication management 10/19/2016  . Bradycardia 10/01/2016  . Preventative health care 10/01/2016  . Absence of bladder continence 07/30/2016  . TMJ arthralgia 01/02/2016  . Edema 07/25/2014  . Left knee pain 07/25/2014  . Abnormal EKG 04/01/2014  . Medicare annual wellness visit, subsequent 02/15/2014  . Restless leg syndrome 11/21/2012  . Elevated serum creatinine 06/27/2012  . Non-insulin treated type 2 diabetes mellitus ([place]) 06/27/2012  . ED (erectile dysfunction)  02/03/2012  . Elevated PSA 12/15/2011  . IRRITABLE BOWEL SYNDROME 12/29/2010  . BEN LOC HYPERPLASIA PROS W/O UR OBST & OTH LUTS 11/03/2009  . Urinary frequency 11/03/2009  . Gout 03/11/2009  . Vitamin D deficiency 10/27/2008  . Anemia 10/27/2008  . ATOPIC RHINITIS 10/27/2008  . LOW BACK PAIN, CHRONIC 06/25/2008  . Hypothyroidism 10/17/2007  . Hyperlipidemia 10/17/2007  . DEPRESSION 07/19/2007  . Essential hypertension 07/19/2007  . COLON CANCER, HX OF 07/19/2007   Past Medical History:  Diagnosis Date  . Allergic rhinitis   . Anxiety 2003   "after cancer surgery"  . Asthma    childhood per pt.  . Bradycardia 10/01/2016  . Cancer ([place])    colon; diagnosed 2003  . Cataract 12/29/2016   not sure which eye  . Chronic renal insufficiency 06/07/2017  . Depression   . Diabetes type 2, controlled ([place]) 06/27/2012  . Glaucoma   . Gout   . Hyperlipidemia   . Hypertension   . Medicare annual wellness visit, subsequent 02/15/2014  . Open-angle glaucoma 12/29/2016  . Sickle cell anemia ([place]) 06/12/2017   "they say I have a trace"  . TMJ arthralgia 01/02/2016    Family History  Problem Relation Age of Onset  . Diabetes Mother   . Heart disease Mother        MI  . Cancer Father        prostate  . Prostate cancer Father   . Prostate cancer Brother   . Colon cancer Neg Hx   . Stomach cancer Neg Hx   . Esophageal cancer Neg Hx   . Rectal cancer Neg Hx     Past Surgical History:  Procedure Laterality Date  . COLON SURGERY  2003   for colon cancer  . COLONOSCOPY    . INGUINAL HERNIA REPAIR  1997   right   Social History   Occupational History  .  Disabled   Social History Main Topics  . Smoking status: Never Smoker  . Smokeless tobacco: Never Used  . Alcohol use No  . Drug use: No  . Sexual activity: Not Currently

## 2017-09-11 ENCOUNTER — Other Ambulatory Visit: Payer: Self-pay | Admitting: Family Medicine

## 2017-09-12 ENCOUNTER — Other Ambulatory Visit: Payer: Self-pay | Admitting: Family Medicine

## 2017-09-12 ENCOUNTER — Other Ambulatory Visit: Payer: Self-pay | Admitting: Cardiology

## 2017-09-12 DIAGNOSIS — G2581 Restless legs syndrome: Secondary | ICD-10-CM

## 2017-09-12 DIAGNOSIS — E559 Vitamin D deficiency, unspecified: Secondary | ICD-10-CM

## 2017-09-12 DIAGNOSIS — N529 Male erectile dysfunction, unspecified: Secondary | ICD-10-CM

## 2017-09-12 DIAGNOSIS — R972 Elevated prostate specific antigen [PSA]: Secondary | ICD-10-CM

## 2017-09-12 DIAGNOSIS — M109 Gout, unspecified: Secondary | ICD-10-CM

## 2017-09-12 DIAGNOSIS — E118 Type 2 diabetes mellitus with unspecified complications: Secondary | ICD-10-CM

## 2017-09-12 DIAGNOSIS — D649 Anemia, unspecified: Secondary | ICD-10-CM

## 2017-09-26 ENCOUNTER — Other Ambulatory Visit: Payer: Self-pay | Admitting: Family Medicine

## 2017-10-08 ENCOUNTER — Ambulatory Visit (INDEPENDENT_AMBULATORY_CARE_PROVIDER_SITE_OTHER): Payer: Medicare Other | Admitting: Orthopaedic Surgery

## 2017-10-08 ENCOUNTER — Encounter (INDEPENDENT_AMBULATORY_CARE_PROVIDER_SITE_OTHER): Payer: Self-pay | Admitting: Orthopaedic Surgery

## 2017-10-08 ENCOUNTER — Ambulatory Visit (INDEPENDENT_AMBULATORY_CARE_PROVIDER_SITE_OTHER): Payer: Medicare Other | Admitting: Family Medicine

## 2017-10-08 VITALS — BP 104/69 | HR 63 | Resp 12 | Ht 72.0 in | Wt 200.0 lb

## 2017-10-08 VITALS — BP 118/76 | HR 70 | Temp 98.3°F | Resp 18 | Wt 212.8 lb

## 2017-10-08 DIAGNOSIS — R972 Elevated prostate specific antigen [PSA]: Secondary | ICD-10-CM | POA: Diagnosis not present

## 2017-10-08 DIAGNOSIS — Z79899 Other long term (current) drug therapy: Secondary | ICD-10-CM

## 2017-10-08 DIAGNOSIS — N529 Male erectile dysfunction, unspecified: Secondary | ICD-10-CM | POA: Diagnosis not present

## 2017-10-08 DIAGNOSIS — D649 Anemia, unspecified: Secondary | ICD-10-CM | POA: Diagnosis not present

## 2017-10-08 DIAGNOSIS — E038 Other specified hypothyroidism: Secondary | ICD-10-CM

## 2017-10-08 DIAGNOSIS — G2581 Restless legs syndrome: Secondary | ICD-10-CM

## 2017-10-08 DIAGNOSIS — M25512 Pain in left shoulder: Secondary | ICD-10-CM

## 2017-10-08 DIAGNOSIS — E7849 Other hyperlipidemia: Secondary | ICD-10-CM

## 2017-10-08 DIAGNOSIS — E559 Vitamin D deficiency, unspecified: Secondary | ICD-10-CM

## 2017-10-08 DIAGNOSIS — E118 Type 2 diabetes mellitus with unspecified complications: Secondary | ICD-10-CM | POA: Diagnosis not present

## 2017-10-08 DIAGNOSIS — G5602 Carpal tunnel syndrome, left upper limb: Secondary | ICD-10-CM | POA: Diagnosis not present

## 2017-10-08 DIAGNOSIS — M1A0710 Idiopathic chronic gout, right ankle and foot, without tophus (tophi): Secondary | ICD-10-CM | POA: Diagnosis not present

## 2017-10-08 DIAGNOSIS — M109 Gout, unspecified: Secondary | ICD-10-CM

## 2017-10-08 DIAGNOSIS — E119 Type 2 diabetes mellitus without complications: Secondary | ICD-10-CM

## 2017-10-08 DIAGNOSIS — I10 Essential (primary) hypertension: Secondary | ICD-10-CM

## 2017-10-08 DIAGNOSIS — R7989 Other specified abnormal findings of blood chemistry: Secondary | ICD-10-CM

## 2017-10-08 DIAGNOSIS — G8929 Other chronic pain: Secondary | ICD-10-CM

## 2017-10-08 DIAGNOSIS — R001 Bradycardia, unspecified: Secondary | ICD-10-CM

## 2017-10-08 DIAGNOSIS — M25511 Pain in right shoulder: Secondary | ICD-10-CM

## 2017-10-08 LAB — CBC
HCT: 44 % (ref 39.0–52.0)
HEMOGLOBIN: 14.2 g/dL (ref 13.0–17.0)
MCHC: 32.4 g/dL (ref 30.0–36.0)
MCV: 79.3 fl (ref 78.0–100.0)
Platelets: 263 10*3/uL (ref 150.0–400.0)
RBC: 5.55 Mil/uL (ref 4.22–5.81)
RDW: 16.6 % — ABNORMAL HIGH (ref 11.5–15.5)
WBC: 5.6 10*3/uL (ref 4.0–10.5)

## 2017-10-08 LAB — COMPREHENSIVE METABOLIC PANEL
ALBUMIN: 4.1 g/dL (ref 3.5–5.2)
ALK PHOS: 50 U/L (ref 39–117)
ALT: 17 U/L (ref 0–53)
AST: 19 U/L (ref 0–37)
BILIRUBIN TOTAL: 0.3 mg/dL (ref 0.2–1.2)
BUN: 23 mg/dL (ref 6–23)
CALCIUM: 10.2 mg/dL (ref 8.4–10.5)
CO2: 26 mEq/L (ref 19–32)
Chloride: 102 mEq/L (ref 96–112)
Creatinine, Ser: 1.9 mg/dL — ABNORMAL HIGH (ref 0.40–1.50)
GFR: 44.94 mL/min — AB (ref >60.00)
Glucose, Bld: 106 mg/dL — ABNORMAL HIGH (ref 70–99)
POTASSIUM: 4 meq/L (ref 3.5–5.1)
Sodium: 137 mEq/L (ref 135–145)
TOTAL PROTEIN: 8.1 g/dL (ref 6.0–8.3)

## 2017-10-08 LAB — LIPID PANEL
CHOL/HDL RATIO: 5
CHOLESTEROL: 168 mg/dL (ref 0–200)
HDL: 36.6 mg/dL — AB (ref >39.00)
NONHDL: 130.94
TRIGLYCERIDES: 209 mg/dL — AB (ref 0.0–149.0)
VLDL: 41.8 mg/dL — ABNORMAL HIGH (ref 0.0–40.0)

## 2017-10-08 LAB — LDL CHOLESTEROL, DIRECT: Direct LDL: 96 mg/dL

## 2017-10-08 LAB — TSH: TSH: 3.4 u[IU]/mL (ref 0.35–4.50)

## 2017-10-08 LAB — URIC ACID: Uric Acid, Serum: 6.5 mg/dL (ref 4.0–7.8)

## 2017-10-08 LAB — HEMOGLOBIN A1C: Hgb A1c MFr Bld: 6 % (ref 4.6–6.5)

## 2017-10-08 LAB — VITAMIN D 25 HYDROXY (VIT D DEFICIENCY, FRACTURES): VITD: 45.41 ng/mL (ref 30.00–100.00)

## 2017-10-08 MED ORDER — HYDROCODONE-ACETAMINOPHEN 10-325 MG PO TABS: 1.0000 | ORAL_TABLET | Freq: Two times a day (BID) | ORAL | 0 refills | Status: DC | PRN

## 2017-10-08 NOTE — Progress Notes (Signed)
Office Visit Note   Patient: Daniel Gates           Date of Birth: 06-26-1946           MRN: [id number] Visit Date: 10/08/2017              Requested by: Daniel Lukes, MD [address] [place], [place] [address] PCP: Daniel Lukes, MD   Assessment & Plan: Visit Diagnoses:  1. Carpal tunnel syndrome, left upper limb     Plan: left hand and wrist symptoms are combination of entrapment neuropathy of the median ulnar nerves and diabetic peripheral neuropathy. EMGs and nerve conduction studies.[name] thinks that he is " a little better" with a volar wrist splint. He would like to give it a little bit more time before we conside other treatment options I.e.injection  Follow-Up Instructions: Return in about 1 month (around 11/07/2017).   Orders:  No orders of the defined types were placed in this encounter.  No orders of the defined types were placed in this encounter.     Procedures: No procedures performed   Clinical Data: No additional findings.   Subjective: Chief Complaint  Patient presents with  . Left Hand - Pain, Edema    Daniel Gates is a 71 y o that is here for chronic Left hand pain.  He relates the injection did not help very much. Numbness and tingling all the time  not having as much numbness in his left arm. Still having some numbness in his left hand but"less" since he's been wearing the splint.  HPI  Review of Systems  Constitutional: Negative for fatigue.  HENT: Negative for hearing loss.   Respiratory: Negative for apnea, chest tightness and shortness of breath.   Cardiovascular: Negative for chest pain, palpitations and leg swelling.  Gastrointestinal: Negative for blood in stool, constipation and diarrhea.  Genitourinary: Negative for difficulty urinating.  Musculoskeletal: Negative for arthralgias, back pain, joint swelling, myalgias, neck pain and neck stiffness.  Neurological: Positive for weakness and numbness.  Negative for headaches.  Hematological: Does not bruise/bleed easily.  Psychiatric/Behavioral: Negative for sleep disturbance. The patient is not nervous/anxious.      Objective: Vital Signs: BP 104/69   Pulse 63   Resp 12   Ht 6' (1.829 m)   Wt 200 lb (90.7 kg)   BMI 27.12 kg/m   Physical Exam  Ortho Exam good grip and release left hand. Good opposition of thumb to little finger. Good strength. Still has some tingling and numbness in tips of all of his fingers. Mild Tinel's over the median nerve and less over the ulnar nerve. No pain with range of motion of wrist and elbow or left shoulder Specialty Comments:  No specialty comments available.  Imaging: No results found.   PMFS History: Patient Active Problem List   Diagnosis Date Noted  . Chronic renal insufficiency 06/07/2017  . Neck pain 12/31/2016  . Cataract 12/29/2016  . Medication management 10/19/2016  . Bradycardia 10/01/2016  . Preventative health care 10/01/2016  . Absence of bladder continence 07/30/2016  . TMJ arthralgia 01/02/2016  . Edema 07/25/2014  . Left knee pain 07/25/2014  . Abnormal EKG 04/01/2014  . Medicare annual wellness visit, subsequent 02/15/2014  . Restless leg syndrome 11/21/2012  . Elevated serum creatinine 06/27/2012  . Non-insulin treated type 2 diabetes mellitus ([place]) 06/27/2012  . ED (erectile dysfunction) 02/03/2012  . Shoulder pain, bilateral 12/15/2011  . Elevated PSA 12/15/2011  .  IRRITABLE BOWEL SYNDROME 12/29/2010  . BEN LOC HYPERPLASIA PROS W/O UR OBST & OTH LUTS 11/03/2009  . Urinary frequency 11/03/2009  . Gout 03/11/2009  . Vitamin D deficiency 10/27/2008  . Anemia 10/27/2008  . ATOPIC RHINITIS 10/27/2008  . LOW BACK PAIN, CHRONIC 06/25/2008  . Hypothyroidism 10/17/2007  . Hyperlipidemia 10/17/2007  . DEPRESSION 07/19/2007  . Essential hypertension 07/19/2007  . COLON CANCER, HX OF 07/19/2007   Past Medical History:  Diagnosis Date  . Allergic rhinitis   .  Anxiety 2003   "after cancer surgery"  . Asthma    childhood per pt.  . Bradycardia 10/01/2016  . Cancer ([place])    colon; diagnosed 2003  . Cataract 12/29/2016   not sure which eye  . Chronic renal insufficiency 06/07/2017  . Depression   . Diabetes type 2, controlled ([place]) 06/27/2012  . Glaucoma   . Gout   . Hyperlipidemia   . Hypertension   . Medicare annual wellness visit, subsequent 02/15/2014  . Open-angle glaucoma 12/29/2016  . Sickle cell anemia ([place]) 06/12/2017   "they say I have a trace"  . TMJ arthralgia 01/02/2016    Family History  Problem Relation Age of Onset  . Diabetes Mother   . Heart disease Mother        MI  . Cancer Father        prostate  . Prostate cancer Father   . Prostate cancer Brother   . Colon cancer Neg Hx   . Stomach cancer Neg Hx   . Esophageal cancer Neg Hx   . Rectal cancer Neg Hx     Past Surgical History:  Procedure Laterality Date  . COLON SURGERY  2003   for colon cancer  . COLONOSCOPY    . INGUINAL HERNIA REPAIR  1997   right   Social History   Occupational History    Employer: DISABLED  Tobacco Use  . Smoking status: Never Smoker  . Smokeless tobacco: Never Used  Substance and Sexual Activity  . Alcohol use: No  . Drug use: No  . Sexual activity: Not Currently

## 2017-10-08 NOTE — Assessment & Plan Note (Signed)
RRR today 

## 2017-10-08 NOTE — Assessment & Plan Note (Signed)
Tolerating statin, encouraged heart healthy diet, avoid trans fats, minimize simple carbs and saturated fats. Increase exercise as tolerated 

## 2017-10-08 NOTE — Assessment & Plan Note (Signed)
Well controlled, no changes to meds. Encouraged heart healthy diet such as the DASH diet and exercise as tolerated.  °

## 2017-10-08 NOTE — Assessment & Plan Note (Signed)
On Levothyroxine, continue to monitor 

## 2017-10-08 NOTE — Assessment & Plan Note (Signed)
Stable encouraged to avoid OTC meds and stay well hydrated.

## 2017-10-08 NOTE — Patient Instructions (Signed)
64 oz of clear fluids daily  Low-Purine Diet Purines are compounds that affect the level of uric acid in your body. A low-purine diet is a diet that is low in purines. Eating a low-purine diet can prevent the level of uric acid in your body from getting too high and causing gout or kidney stones or both. What do I need to know about this diet?  Choose low-purine foods. Examples of low-purine foods are listed in the next section.  Drink plenty of fluids, especially water. Fluids can help remove uric acid from your body. Try to drink 8-16 cups (1.9-3.8 L) a day.  Limit foods high in fat, especially saturated fat, as fat makes it harder for the body to get rid of uric acid. Foods high in saturated fat include pizza, cheese, ice cream, whole milk, fried foods, and gravies. Choose foods that are lower in fat and lean sources of protein. Use olive oil when cooking as it contains healthy fats that are not high in saturated fat.  Limit alcohol. Alcohol interferes with the elimination of uric acid from your body. If you are having a gout attack, avoid all alcohol.  Keep in mind that different people's bodies react differently to different foods. You will probably learn over time which foods do or do not affect you. If you discover that a food tends to cause your gout to flare up, avoid eating that food. You can more freely enjoy foods that do not cause problems. If you have any questions about a food item, talk to your dietitian or health care provider. Which foods are low, moderate, and high in purines? The following is a list of foods that are low, moderate, and high in purines. You can eat any amount of the foods that are low in purines. You may be able to have small amounts of foods that are moderate in purines. Ask your health care provider how much of a food moderate in purines you can have. Avoid foods high in purines. Grains  Foods low in purines: Enriched white bread, pasta, rice, cake, cornbread,  popcorn.  Foods moderate in purines: Whole-grain breads and cereals, wheat germ, bran, oatmeal. Uncooked oatmeal. Dry wheat bran or wheat germ.  Foods high in purines: Pancakes, [place] toast, biscuits, muffins. Vegetables  Foods low in purines: All vegetables, except those that are moderate in purines.  Foods moderate in purines: Asparagus, cauliflower, spinach, mushrooms, green peas. Fruits  All fruits are low in purines. Meats and other Protein Foods  Foods low in purines: Eggs, nuts, peanut butter.  Foods moderate in purines: 80-90% lean beef, lamb, veal, pork, poultry, fish, eggs, peanut butter, nuts. Crab, lobster, oysters, and shrimp. Cooked dried beans, peas, and lentils.  Foods high in purines: Anchovies, sardines, herring, mussels, tuna, codfish, scallops, trout, and haddock. Daniel Gates. Organ meats (such as liver or kidney). Tripe. Game meat. Goose. Sweetbreads. Dairy  All dairy foods are low in purines. Low-fat and fat-free dairy products are best because they are low in saturated fat. Beverages  Drinks low in purines: Water, carbonated beverages, tea, coffee, cocoa.  Drinks moderate in purines: Soft drinks and other drinks sweetened with high-fructose corn syrup. Juices. To find whether a food or drink is sweetened with high-fructose corn syrup, look at the ingredients list.  Drinks high in purines: Alcoholic beverages (such as beer). Condiments  Foods low in purines: Salt, herbs, olives, pickles, relishes, vinegar.  Foods moderate in purines: Butter, margarine, oils, mayonnaise. Fats and Oils  Foods  low in purines: All types, except gravies and sauces made with meat.  Foods high in purines: Gravies and sauces made with meat. Other Foods  Foods low in purines: Sugars, sweets, gelatin. Cake. Soups made without meat.  Foods moderate in purines: Meat-based or fish-based soups, broths, or bouillons. Foods and drinks sweetened with high-fructose corn syrup.  Foods high  in purines: High-fat desserts (such as ice cream, cookies, cakes, pies, doughnuts, and chocolate). Contact your dietitian for more information on foods that are not listed here. This information is not intended to replace advice given to you by your health care provider. Make sure you discuss any questions you have with your health care provider. Document Released: 03/17/2011 Document Revised: 04/27/2016 Document Reviewed: 10/27/2013 Elsevier Interactive Patient Education  2017 [employer].

## 2017-10-08 NOTE — Progress Notes (Signed)
Subjective:  I acted as a Education administrator for Dr. Charlett Blake. [place], [place]  Patient ID: Daniel Gates, male    DOB: 30-Aug-1946, 71 y.o.   MRN: [id number]  No chief complaint on file.   HPI  Patient is in today for a 4 month follow up and he feels well. No recent febrile illness or acute hospitalizations. He is noting b/l shoulder pain and stiffness but he denies any trauma or injury. He has had a gout flare since stopping his medication for the gout. No toe pain today but did have some last week. No polyuria or polydipsia. Denies CP/palp/SOB/HA/congestion/fevers/GI or GU c/o. Taking meds as prescribed  Patient Care Team: [name], MD as PCP - General (Family Medicine) Linward Natal, MD as Consulting Physician (Ophthalmology)   Past Medical History:  Diagnosis Date  . Allergic rhinitis   . Anxiety 2003   "after cancer surgery"  . Asthma    childhood per pt.  . Bradycardia 10/01/2016  . Cancer ([facility])    colon; diagnosed 2003  . Cataract 12/29/2016   not sure which eye  . Chronic renal insufficiency 06/07/2017  . Depression   . Diabetes type 2, controlled ([facility]) 06/27/2012  . Glaucoma   . Gout   . Hyperlipidemia   . Hypertension   . Medicare annual wellness visit, subsequent 02/15/2014  . Open-angle glaucoma 12/29/2016  . Sickle cell anemia ([facility]) 06/12/2017   "they say I have a trace"  . TMJ arthralgia 01/02/2016    Past Surgical History:  Procedure Laterality Date  . COLON SURGERY  2003   for colon cancer  . COLONOSCOPY    . INGUINAL HERNIA REPAIR  1997   right    Family History  Problem Relation Age of Onset  . Diabetes Mother   . Heart disease Mother        MI  . Cancer Father        prostate  . Prostate cancer Father   . Prostate cancer Brother   . Colon cancer Neg Hx   . Stomach cancer Neg Hx   . Esophageal cancer Neg Hx   . Rectal cancer Neg Hx     Social History   Socioeconomic History  . Marital status: Widowed    Spouse name: Not on file  . Number  of children: 7  . Years of education: Not on file  . Highest education level: Not on file  Social Needs  . Financial resource strain: Not on file  . Food insecurity - worry: Not on file  . Food insecurity - inability: Not on file  . Transportation needs - medical: Not on file  . Transportation needs - non-medical: Not on file  Occupational History    Employer: DISABLED  Tobacco Use  . Smoking status: Never Smoker  . Smokeless tobacco: Never Used  Substance and Sexual Activity  . Alcohol use: No  . Drug use: No  . Sexual activity: Not Currently  Other Topics Concern  . Not on file  Social History Narrative  . Not on file    Outpatient Medications Prior to Visit  Medication Sig Dispense Refill  . aspirin 325 MG tablet Take 325 mg by mouth daily.     Marland Kitchen atorvastatin (LIPITOR) 20 MG tablet Take 1 tablet (20 mg total) by mouth daily. 30 tablet 5  . COD LIVER OIL PO Take 1 tablet by mouth daily.     Marland Kitchen dicyclomine (BENTYL) 20 MG tablet Take 1 tablet (  20 mg total) by mouth every 6 (six) hours. (Patient taking differently: Take 20 mg by mouth 2 (two) times daily. ) 120 tablet 0  . escitalopram (LEXAPRO) 10 MG tablet TAKE 1 TABLET BY MOUTH DAILY 30 tablet 5  . fenofibrate (TRICOR) 145 MG tablet TAKE 1 TABLET(130 MG) BY MOUTH DAILY BEFORE BREAKFAST 30 tablet 0  . furosemide (LASIX) 40 MG tablet Take 1 tablet (40 mg total) by mouth 2 (two) times daily as needed. Take 1 in the am and 1 in midafternoon for edema and BP as needed 60 tablet 11  . Garlic Oil 0277 MG CAPS Take 1 capsule by mouth daily.     . hydrochlorothiazide (HYDRODIURIL) 25 MG tablet TAKE 1 TABLET(25 MG) BY MOUTH DAILY 30 tablet 0  . indomethacin (INDOCIN SR) 75 MG CR capsule Take 1 capsule (75 mg total) by mouth 3 (three) times daily as needed. 90 capsule 6  . latanoprost (XALATAN) 0.005 % ophthalmic solution Place 1 drop into both eyes nightly.    . metFORMIN (GLUCOPHAGE) 500 MG tablet TAKE 1 TABLET(500 MG) BY MOUTH TWICE  DAILY WITH A MEAL 180 tablet 0  . metoprolol succinate (TOPROL-XL) 25 MG 24 hr tablet Take 1 tablet (25 mg total) by mouth daily. Please schedule an appointment for further refills 15 tablet 0  . metoprolol tartrate (LOPRESSOR) 25 MG tablet Take 25 mg by mouth 2 (two) times daily.    . Multiple Vitamins-Minerals (ONE-A-DAY MENS HEALTH FORMULA) TABS Take 1 tablet by mouth daily.    . ONE TOUCH ULTRA TEST test strip USE TO TEST BLOOD SUGAR TWICE DAILY AS DIRECTED 100 each 0  . ONETOUCH DELICA LANCETS 41O MISC USE TO TEST BLOOD SUGAR TWICE DAILY AS DIRECTED 100 each 6  . promethazine (PHENERGAN) 25 MG tablet TAKE ONE (1) TABLET(S) EVERY FOUR (4) HOURS AS NEEDED FOR NAUSEA 60 tablet 0  . rOPINIRole (REQUIP) 1 MG tablet TAKE 1 TO 2 TABLETS BY MOUTH AT BEDTIME 60 tablet 0  . tamsulosin (FLOMAX) 0.4 MG CAPS capsule TAKE 1 CAPSULE BY MOUTH DAILY AFTER DINNER 30 capsule 0  . HYDROcodone-acetaminophen (NORCO) 10-325 MG tablet Take 1 tablet by mouth every 6 (six) hours as needed for moderate pain or severe pain. 120 tablet 0  . 0.9 %  sodium chloride infusion      No facility-administered medications prior to visit.     No Known Allergies  Review of Systems  Constitutional: Negative for fever and malaise/fatigue.  HENT: Negative for congestion.   Eyes: Negative for blurred vision.  Respiratory: Negative for shortness of breath.   Cardiovascular: Negative for chest pain, palpitations and leg swelling.  Gastrointestinal: Negative for abdominal pain, blood in stool and nausea.  Genitourinary: Negative for dysuria and frequency.  Musculoskeletal: Positive for joint pain. Negative for falls.  Skin: Negative for rash.  Neurological: Negative for dizziness, loss of consciousness and headaches.  Endo/Heme/Allergies: Negative for environmental allergies.  Psychiatric/Behavioral: Negative for depression. The patient is not nervous/anxious.        Objective:    Physical Exam  Constitutional: He is  oriented to person, place, and time. He appears well-developed and well-nourished. No distress.  HENT:  Head: Normocephalic and atraumatic.  Nose: Nose normal.  Eyes: Right eye exhibits no discharge. Left eye exhibits no discharge.  Neck: Normal range of motion. Neck supple.  Cardiovascular: Normal rate and regular rhythm.  No murmur heard. Pulmonary/Chest: Effort normal and breath sounds normal.  Abdominal: Soft. Bowel sounds are normal.  There is no tenderness.  Musculoskeletal: He exhibits no edema.  Neurological: He is alert and oriented to person, place, and time.  Skin: Skin is warm and dry.  Psychiatric: He has a normal mood and affect.  Nursing note and vitals reviewed.   BP 118/76 (BP Location: Left Arm, Patient Position: Sitting, Cuff Size: Normal)   Pulse 70   Temp 98.3 F (36.8 C) (Oral)   Resp 18   Wt 212 lb 12.8 oz (96.5 kg)   SpO2 94%   BMI 28.86 kg/m  Wt Readings from Last 3 Encounters:  10/08/17 200 lb (90.7 kg)  10/08/17 212 lb 12.8 oz (96.5 kg)  09/07/17 200 lb (90.7 kg)   BP Readings from Last 3 Encounters:  10/08/17 104/69  10/08/17 118/76  09/07/17 126/79     Immunization History  Administered Date(s) Administered  . Td 12/04/1997  . Tdap 01/30/2012    Health Maintenance  Topic Date Due  . URINE MICROALBUMIN  10/14/2016  . HEMOGLOBIN A1C  12/24/2016  . FOOT EXAM  05/18/2017  . PNA vac Low Risk Adult (2 of 2 - PPSV23) 06/26/2018 (Originally 12/23/2016)  . OPHTHALMOLOGY EXAM  03/27/2018  . TETANUS/TDAP  01/29/2022  . COLONOSCOPY  06/12/2022  . Hepatitis C Screening  Completed  . INFLUENZA VACCINE  Discontinued    Lab Results  Component Value Date   WBC 5.6 10/08/2017   HGB 14.2 10/08/2017   HCT 44.0 10/08/2017   PLT 263.0 10/08/2017   GLUCOSE 106 (H) 10/08/2017   CHOL 168 10/08/2017   TRIG 209.0 (H) 10/08/2017   HDL 36.60 (L) 10/08/2017   LDLDIRECT 96.0 10/08/2017   LDLCALC NOT CALC 12/24/2015   ALT 17 10/08/2017   AST 19  10/08/2017   NA 137 10/08/2017   K 4.0 10/08/2017   CL 102 10/08/2017   CREATININE 1.90 (H) 10/08/2017   BUN 23 10/08/2017   CO2 26 10/08/2017   TSH 3.40 10/08/2017   PSA 7.46 (H) 09/28/2016   HGBA1C 6.0 10/08/2017   MICROALBUR 1.7 10/15/2015    Lab Results  Component Value Date   TSH 3.40 10/08/2017   Lab Results  Component Value Date   WBC 5.6 10/08/2017   HGB 14.2 10/08/2017   HCT 44.0 10/08/2017   MCV 79.3 10/08/2017   PLT 263.0 10/08/2017   Lab Results  Component Value Date   NA 137 10/08/2017   K 4.0 10/08/2017   CO2 26 10/08/2017   GLUCOSE 106 (H) 10/08/2017   BUN 23 10/08/2017   CREATININE 1.90 (H) 10/08/2017   BILITOT 0.3 10/08/2017   ALKPHOS 50 10/08/2017   AST 19 10/08/2017   ALT 17 10/08/2017   PROT 8.1 10/08/2017   ALBUMIN 4.1 10/08/2017   CALCIUM 10.2 10/08/2017   GFR 44.94 (L) 10/08/2017   Lab Results  Component Value Date   CHOL 168 10/08/2017   Lab Results  Component Value Date   HDL 36.60 (L) 10/08/2017   Lab Results  Component Value Date   LDLCALC NOT CALC 12/24/2015   Lab Results  Component Value Date   TRIG 209.0 (H) 10/08/2017   Lab Results  Component Value Date   CHOLHDL 5 10/08/2017   Lab Results  Component Value Date   HGBA1C 6.0 10/08/2017         Assessment & Plan:   Problem List Items Addressed This Visit    Hypothyroidism    On Levothyroxine, continue to monitor      Vitamin D deficiency  Check level today      Relevant Orders   VITAMIN D 25 Hydroxy (Vit-D Deficiency, Fractures) (Completed)   Hyperlipidemia    Tolerating statin, encouraged heart healthy diet, avoid trans fats, minimize simple carbs and saturated fats. Increase exercise as tolerated      Relevant Orders   Lipid panel (Completed)   Essential hypertension    Well controlled, no changes to meds. Encouraged heart healthy diet such as the DASH diet and exercise as tolerated.       Relevant Orders   Comprehensive metabolic panel  (Completed)   TSH (Completed)   Gout    Check uric acid level      Relevant Orders   Uric acid (Completed)   Elevated PSA   ED (erectile dysfunction)   Elevated serum creatinine    Stable encouraged to avoid OTC meds and stay well hydrated.       Non-insulin treated type 2 diabetes mellitus (HCC)    hgba1c acceptable, minimize simple carbs. Increase exercise as tolerated. Continue current meds      Restless leg syndrome   Bradycardia    RRR today       Other Visit Diagnoses    High risk medication use    -  Primary   Relevant Orders   Pain Mgmt, Profile 8 w/Conf, U   PROSTATE SPECIFIC ANTIGEN, ELEVATED       Controlled type 2 diabetes mellitus with complication, without long-term current use of insulin (HCC)       Relevant Orders   Hemoglobin A1c (Completed)   ANEMIA       Relevant Orders   CBC (Completed)   Acute gouty arthropathy          I have discontinued Tilford L. Patchen's HYDROcodone-acetaminophen. I am also having him start on HYDROcodone-acetaminophen. Additionally, I am having him maintain his aspirin, Garlic Oil, COD LIVER OIL PO, promethazine, indomethacin, ONE-A-DAY MENS HEALTH FORMULA, furosemide, dicyclomine, atorvastatin, ONETOUCH DELICA LANCETS 21F, metoprolol tartrate, escitalopram, metFORMIN, latanoprost, ONE TOUCH ULTRA TEST, hydrochlorothiazide, rOPINIRole, metoprolol succinate, tamsulosin, and fenofibrate. We will stop administering sodium chloride.  Meds ordered this encounter  Medications  . HYDROcodone-acetaminophen (NORCO) 10-325 MG tablet    Sig: Take 1 tablet 2 (two) times daily as needed by mouth.    Dispense:  30 tablet    Refill:  0    CMA served as scribe during this visit. History, Physical and Plan performed by medical provider. Documentation and orders reviewed and attested to.  Penni Homans, MD

## 2017-10-08 NOTE — Assessment & Plan Note (Signed)
Encouraged moist heat and gentle stretching as tolerated. May try NSAIDs and prescription meds as directed and report if symptoms worsen or seek immediate care. Try topical Lidocaine prn and if worsens consider referral to orthopaedics.

## 2017-10-08 NOTE — Assessment & Plan Note (Signed)
Check level today 

## 2017-10-08 NOTE — Assessment & Plan Note (Signed)
Check uric acid level 

## 2017-10-08 NOTE — Assessment & Plan Note (Signed)
hgba1c acceptable, minimize simple carbs. Increase exercise as tolerated. Continue current meds 

## 2017-10-10 LAB — PAIN MGMT, PROFILE 8 W/CONF, U
6 ACETYLMORPHINE: NEGATIVE ng/mL (ref <10)
AMPHETAMINES: NEGATIVE ng/mL (ref <500)
Alcohol Metabolites: NEGATIVE ng/mL (ref <500)
BENZODIAZEPINES: NEGATIVE ng/mL (ref <100)
BUPRENORPHINE, URINE: NEGATIVE ng/mL (ref <5)
CREATININE: 103.6 mg/dL
Cocaine Metabolite: NEGATIVE ng/mL (ref <150)
MDA: NEGATIVE ng/mL (ref <200)
MDMA: NEGATIVE ng/mL (ref <500)
MDMA: NEGATIVE ng/mL (ref <200)
Marijuana Metabolite: NEGATIVE ng/mL (ref <20)
OPIATES: NEGATIVE ng/mL (ref <100)
OXIDANT: NEGATIVE ug/mL (ref <200)
Oxycodone: NEGATIVE ng/mL (ref <100)
PH: 6.04 (ref 4.5–9.0)

## 2017-10-11 ENCOUNTER — Other Ambulatory Visit: Payer: Self-pay | Admitting: Cardiology

## 2017-10-11 ENCOUNTER — Other Ambulatory Visit: Payer: Self-pay | Admitting: Family Medicine

## 2017-10-11 DIAGNOSIS — E559 Vitamin D deficiency, unspecified: Secondary | ICD-10-CM

## 2017-10-11 DIAGNOSIS — M109 Gout, unspecified: Secondary | ICD-10-CM

## 2017-10-11 DIAGNOSIS — G2581 Restless legs syndrome: Secondary | ICD-10-CM

## 2017-10-11 DIAGNOSIS — D649 Anemia, unspecified: Secondary | ICD-10-CM

## 2017-10-11 DIAGNOSIS — N529 Male erectile dysfunction, unspecified: Secondary | ICD-10-CM

## 2017-10-11 DIAGNOSIS — R972 Elevated prostate specific antigen [PSA]: Secondary | ICD-10-CM

## 2017-10-11 DIAGNOSIS — E118 Type 2 diabetes mellitus with unspecified complications: Secondary | ICD-10-CM

## 2017-10-27 ENCOUNTER — Other Ambulatory Visit: Payer: Self-pay | Admitting: Family Medicine

## 2017-11-06 ENCOUNTER — Other Ambulatory Visit: Payer: Self-pay | Admitting: Family Medicine

## 2017-11-09 ENCOUNTER — Ambulatory Visit (INDEPENDENT_AMBULATORY_CARE_PROVIDER_SITE_OTHER): Payer: Medicare Other | Admitting: Orthopaedic Surgery

## 2017-11-09 ENCOUNTER — Encounter (INDEPENDENT_AMBULATORY_CARE_PROVIDER_SITE_OTHER): Payer: Self-pay | Admitting: Orthopaedic Surgery

## 2017-11-09 VITALS — BP 139/77 | HR 70 | Resp 12 | Ht 74.0 in | Wt 200.0 lb

## 2017-11-09 DIAGNOSIS — G5602 Carpal tunnel syndrome, left upper limb: Secondary | ICD-10-CM | POA: Insufficient documentation

## 2017-11-09 NOTE — Progress Notes (Signed)
Office Visit Note   Patient: Daniel Gates           Date of Birth: 07-Aug-1946           MRN: [id number] Visit Date: 11/09/2017              Requested by: [name], MD [address] [place], [place] [address] PCP: [name], MD   Assessment & Plan: Visit Diagnoses:  1. Carpal tunnel syndrome, left upper limb     Plan: Presently comfortable wearing the volar wrist splint. Aware of his diagnosis of carpal tunnel syndrome and diabetic neuropathy. He does not want to consider surgery or injection this point. Sleeping fine. We'll plan to see back as needed. Long discussion regarding the carpal tunnel and neuropathy and treatment options  Follow-Up Instructions: Return if symptoms worsen or fail to improve.   Orders:  No orders of the defined types were placed in this encounter.  No orders of the defined types were placed in this encounter.     Procedures: No procedures performed   Clinical Data: No additional findings.   Subjective: Chief Complaint  Patient presents with  . Left Wrist - Carpal Tunnel, Pain, Numbness    Daniel Gates is a 71 y o here for a F/U of left carpal tunnel. He relates it is getting better, no worse. Wears splint 50% of time.     HPI  Review of Systems  Constitutional: Negative for fatigue.  HENT: Negative for hearing loss.   Respiratory: Negative for apnea, chest tightness and shortness of breath.   Cardiovascular: Negative for chest pain, palpitations and leg swelling.  Gastrointestinal: Negative for blood in stool, constipation and diarrhea.  Genitourinary: Negative for difficulty urinating.  Musculoskeletal: Negative for arthralgias, back pain, joint swelling, myalgias, neck pain and neck stiffness.  Neurological: Positive for weakness. Negative for numbness and headaches.  Hematological: Does not bruise/bleed easily.  Psychiatric/Behavioral: Negative for sleep disturbance. The patient is not nervous/anxious.        Objective: Vital Signs: BP 139/77   Pulse 70   Resp 12   Ht 6\' 2"  (1.88 m)   Wt 200 lb (90.7 kg)   BMI 25.68 kg/m   Physical Exam  Ortho Exam awake alert and oriented 3. Comfortable sitting. Full range of motion left hand. Splint removed and hand evaluated. Good capillary refill to fingers. Has good sensibility. Minimal Tinel's over the median nerve at the wrist. Good grip good release.  Specialty Comments:  No specialty comments available.  Imaging: No results found.   PMFS History: Patient Active Problem List   Diagnosis Date Noted  . Carpal tunnel syndrome, left upper limb 11/09/2017  . Chronic renal insufficiency 06/07/2017  . Neck pain 12/31/2016  . Cataract 12/29/2016  . Medication management 10/19/2016  . Bradycardia 10/01/2016  . Preventative health care 10/01/2016  . Absence of bladder continence 07/30/2016  . TMJ arthralgia 01/02/2016  . Edema 07/25/2014  . Left knee pain 07/25/2014  . Abnormal EKG 04/01/2014  . Medicare annual wellness visit, subsequent 02/15/2014  . Restless leg syndrome 11/21/2012  . Elevated serum creatinine 06/27/2012  . Non-insulin treated type 2 diabetes mellitus ([facility]) 06/27/2012  . ED (erectile dysfunction) 02/03/2012  . Shoulder pain, bilateral 12/15/2011  . Elevated PSA 12/15/2011  . IRRITABLE BOWEL SYNDROME 12/29/2010  . BEN LOC HYPERPLASIA PROS W/O UR OBST & OTH LUTS 11/03/2009  . Urinary frequency 11/03/2009  . Gout 03/11/2009  . Vitamin  D deficiency 10/27/2008  . Anemia 10/27/2008  . ATOPIC RHINITIS 10/27/2008  . LOW BACK PAIN, CHRONIC 06/25/2008  . Hypothyroidism 10/17/2007  . Hyperlipidemia 10/17/2007  . DEPRESSION 07/19/2007  . Essential hypertension 07/19/2007  . COLON CANCER, HX OF 07/19/2007   Past Medical History:  Diagnosis Date  . Allergic rhinitis   . Anxiety 2003   "after cancer surgery"  . Asthma    childhood per pt.  . Bradycardia 10/01/2016  . Cancer ([facility])    colon; diagnosed 2003  .  Cataract 12/29/2016   not sure which eye  . Chronic renal insufficiency 06/07/2017  . Depression   . Diabetes type 2, controlled ([facility]) 06/27/2012  . Glaucoma   . Gout   . Hyperlipidemia   . Hypertension   . Medicare annual wellness visit, subsequent 02/15/2014  . Open-angle glaucoma 12/29/2016  . Sickle cell anemia ([facility]) 06/12/2017   "they say I have a trace"  . TMJ arthralgia 01/02/2016    Family History  Problem Relation Age of Onset  . Diabetes Mother   . Heart disease Mother        MI  . Cancer Father        prostate  . Prostate cancer Father   . Prostate cancer Brother   . Colon cancer Neg Hx   . Stomach cancer Neg Hx   . Esophageal cancer Neg Hx   . Rectal cancer Neg Hx     Past Surgical History:  Procedure Laterality Date  . COLON SURGERY  2003   for colon cancer  . COLONOSCOPY    . INGUINAL HERNIA REPAIR  1997   right   Social History   Occupational History    Employer: DISABLED  Tobacco Use  . Smoking status: Never Smoker  . Smokeless tobacco: Never Used  Substance and Sexual Activity  . Alcohol use: No  . Drug use: No  . Sexual activity: Not Currently

## 2017-11-16 ENCOUNTER — Other Ambulatory Visit: Payer: Self-pay | Admitting: Family Medicine

## 2017-11-16 ENCOUNTER — Other Ambulatory Visit: Payer: Self-pay | Admitting: Cardiology

## 2017-11-16 DIAGNOSIS — E118 Type 2 diabetes mellitus with unspecified complications: Secondary | ICD-10-CM

## 2017-11-16 DIAGNOSIS — D649 Anemia, unspecified: Secondary | ICD-10-CM

## 2017-11-16 DIAGNOSIS — E559 Vitamin D deficiency, unspecified: Secondary | ICD-10-CM

## 2017-11-16 DIAGNOSIS — R972 Elevated prostate specific antigen [PSA]: Secondary | ICD-10-CM

## 2017-11-16 DIAGNOSIS — N529 Male erectile dysfunction, unspecified: Secondary | ICD-10-CM

## 2017-11-16 DIAGNOSIS — G2581 Restless legs syndrome: Secondary | ICD-10-CM

## 2017-11-16 DIAGNOSIS — M109 Gout, unspecified: Secondary | ICD-10-CM

## 2017-11-16 NOTE — Telephone Encounter (Signed)
REFILL 

## 2017-11-25 ENCOUNTER — Other Ambulatory Visit: Payer: Self-pay | Admitting: Family Medicine

## 2017-11-25 DIAGNOSIS — N529 Male erectile dysfunction, unspecified: Secondary | ICD-10-CM

## 2017-11-25 DIAGNOSIS — E559 Vitamin D deficiency, unspecified: Secondary | ICD-10-CM

## 2017-11-25 DIAGNOSIS — D649 Anemia, unspecified: Secondary | ICD-10-CM

## 2017-11-25 DIAGNOSIS — M109 Gout, unspecified: Secondary | ICD-10-CM

## 2017-11-25 DIAGNOSIS — R972 Elevated prostate specific antigen [PSA]: Secondary | ICD-10-CM

## 2017-11-25 DIAGNOSIS — E118 Type 2 diabetes mellitus with unspecified complications: Secondary | ICD-10-CM

## 2017-11-25 DIAGNOSIS — G2581 Restless legs syndrome: Secondary | ICD-10-CM

## 2017-12-13 ENCOUNTER — Other Ambulatory Visit: Payer: Self-pay | Admitting: Cardiology

## 2017-12-13 ENCOUNTER — Other Ambulatory Visit: Payer: Self-pay | Admitting: Family Medicine

## 2017-12-13 DIAGNOSIS — M109 Gout, unspecified: Secondary | ICD-10-CM

## 2017-12-13 DIAGNOSIS — R972 Elevated prostate specific antigen [PSA]: Secondary | ICD-10-CM

## 2017-12-13 DIAGNOSIS — G2581 Restless legs syndrome: Secondary | ICD-10-CM

## 2017-12-13 DIAGNOSIS — E559 Vitamin D deficiency, unspecified: Secondary | ICD-10-CM

## 2017-12-13 DIAGNOSIS — N529 Male erectile dysfunction, unspecified: Secondary | ICD-10-CM

## 2017-12-13 DIAGNOSIS — D649 Anemia, unspecified: Secondary | ICD-10-CM

## 2017-12-13 DIAGNOSIS — E118 Type 2 diabetes mellitus with unspecified complications: Secondary | ICD-10-CM

## 2017-12-25 ENCOUNTER — Other Ambulatory Visit: Payer: Self-pay | Admitting: Family Medicine

## 2017-12-26 ENCOUNTER — Other Ambulatory Visit: Payer: Self-pay | Admitting: Family Medicine

## 2018-01-14 ENCOUNTER — Other Ambulatory Visit: Payer: Self-pay | Admitting: Family Medicine

## 2018-01-14 ENCOUNTER — Other Ambulatory Visit: Payer: Self-pay | Admitting: Cardiology

## 2018-01-14 DIAGNOSIS — G2581 Restless legs syndrome: Secondary | ICD-10-CM

## 2018-01-14 DIAGNOSIS — E118 Type 2 diabetes mellitus with unspecified complications: Secondary | ICD-10-CM

## 2018-01-14 DIAGNOSIS — R972 Elevated prostate specific antigen [PSA]: Secondary | ICD-10-CM

## 2018-01-14 DIAGNOSIS — M109 Gout, unspecified: Secondary | ICD-10-CM

## 2018-01-14 DIAGNOSIS — N529 Male erectile dysfunction, unspecified: Secondary | ICD-10-CM

## 2018-01-14 DIAGNOSIS — E559 Vitamin D deficiency, unspecified: Secondary | ICD-10-CM

## 2018-01-14 DIAGNOSIS — D649 Anemia, unspecified: Secondary | ICD-10-CM

## 2018-01-14 NOTE — Telephone Encounter (Signed)
REFILL 

## 2018-01-22 ENCOUNTER — Other Ambulatory Visit: Payer: Self-pay | Admitting: Family Medicine

## 2018-02-04 ENCOUNTER — Ambulatory Visit (INDEPENDENT_AMBULATORY_CARE_PROVIDER_SITE_OTHER): Payer: Medicare Other | Admitting: Family Medicine

## 2018-02-04 ENCOUNTER — Encounter: Payer: Self-pay | Admitting: Family Medicine

## 2018-02-04 VITALS — BP 118/70 | HR 59 | Temp 98.2°F | Resp 18 | Ht 74.0 in | Wt 214.8 lb

## 2018-02-04 DIAGNOSIS — G8929 Other chronic pain: Secondary | ICD-10-CM

## 2018-02-04 DIAGNOSIS — M25511 Pain in right shoulder: Secondary | ICD-10-CM | POA: Diagnosis not present

## 2018-02-04 DIAGNOSIS — I10 Essential (primary) hypertension: Secondary | ICD-10-CM

## 2018-02-04 DIAGNOSIS — N189 Chronic kidney disease, unspecified: Secondary | ICD-10-CM | POA: Diagnosis not present

## 2018-02-04 DIAGNOSIS — M1A0710 Idiopathic chronic gout, right ankle and foot, without tophus (tophi): Secondary | ICD-10-CM | POA: Diagnosis not present

## 2018-02-04 DIAGNOSIS — Z0000 Encounter for general adult medical examination without abnormal findings: Secondary | ICD-10-CM | POA: Diagnosis not present

## 2018-02-04 DIAGNOSIS — E559 Vitamin D deficiency, unspecified: Secondary | ICD-10-CM

## 2018-02-04 DIAGNOSIS — E7849 Other hyperlipidemia: Secondary | ICD-10-CM | POA: Diagnosis not present

## 2018-02-04 DIAGNOSIS — R350 Frequency of micturition: Secondary | ICD-10-CM

## 2018-02-04 DIAGNOSIS — M25512 Pain in left shoulder: Secondary | ICD-10-CM

## 2018-02-04 DIAGNOSIS — E119 Type 2 diabetes mellitus without complications: Secondary | ICD-10-CM

## 2018-02-04 MED ORDER — METOPROLOL SUCCINATE ER 25 MG PO TB24: 25.0000 mg | ORAL_TABLET | Freq: Every day | ORAL | 0 refills | Status: DC

## 2018-02-04 NOTE — Assessment & Plan Note (Signed)
Tolerating statin, encouraged heart healthy diet, avoid trans fats, minimize simple carbs and saturated fats. Increase exercise as tolerated 

## 2018-02-04 NOTE — Assessment & Plan Note (Signed)
Well controlled, no changes to meds. Encouraged heart healthy diet such as the DASH diet and exercise as tolerated.  °

## 2018-02-04 NOTE — Progress Notes (Signed)
Subjective:  I acted as a Education administrator for Dr. Charlett Blake. [place], [place]  Patient ID: Daniel Gates, male    DOB: 1946-05-29, 72 y.o.   MRN: [id number]  No chief complaint on file.   HPI  Patient is in today for a physical exam and follow up on chronic medical concerns including, gout, DM, hyperlipidemia and HTN. No recent hospitalizations or febrile illness. He had a recent flare of gout in his right great toe. Feels better now. He just rested and hydrated more and it resolved. Notes some recent increase in polyuria but no dysuria. No hesitancy but notes some recent episodes of incontinence. Doing well with ADLs although he acknowledges some trouble with decreased strength and ROM in right arm but no injury. His shoulder pain limits his activity. Denies CP/palp/SOB/HA/congestion/fevers/GI or GU c/o. Taking meds as prescribed  Patient Care Team: [name], MD as PCP - General (Family Medicine) Linward Natal, MD as Consulting Physician (Ophthalmology)   Past Medical History:  Diagnosis Date  . Allergic rhinitis   . Anxiety 2003   "after cancer surgery"  . Asthma    childhood per pt.  . Bradycardia 10/01/2016  . Cancer ([place])    colon; diagnosed 2003  . Cataract 12/29/2016   not sure which eye  . Chronic renal insufficiency 06/07/2017  . Depression   . Diabetes type 2, controlled ([place]) 06/27/2012  . Glaucoma   . Gout   . Hyperlipidemia   . Hypertension   . Medicare annual wellness visit, subsequent 02/15/2014  . Open-angle glaucoma 12/29/2016  . Sickle cell anemia ([place]) 06/12/2017   "they say I have a trace"  . TMJ arthralgia 01/02/2016    Past Surgical History:  Procedure Laterality Date  . COLON SURGERY  2003   for colon cancer  . COLONOSCOPY    . INGUINAL HERNIA REPAIR  1997   right    Family History  Problem Relation Age of Onset  . Diabetes Mother   . Heart disease Mother        MI  . Cancer Father        prostate  . Prostate cancer Father   . Prostate cancer  Brother   . Colon cancer Neg Hx   . Stomach cancer Neg Hx   . Esophageal cancer Neg Hx   . Rectal cancer Neg Hx     Social History   Socioeconomic History  . Marital status: Widowed    Spouse name: Not on file  . Number of children: 7  . Years of education: Not on file  . Highest education level: Not on file  Social Needs  . Financial resource strain: Not on file  . Food insecurity - worry: Not on file  . Food insecurity - inability: Not on file  . Transportation needs - medical: Not on file  . Transportation needs - non-medical: Not on file  Occupational History    Employer: DISABLED  Tobacco Use  . Smoking status: Never Smoker  . Smokeless tobacco: Never Used  Substance and Sexual Activity  . Alcohol use: No  . Drug use: No  . Sexual activity: Not Currently  Other Topics Concern  . Not on file  Social History Narrative  . Not on file    Outpatient Medications Prior to Visit  Medication Sig Dispense Refill  . aspirin 325 MG tablet Take 325 mg by mouth daily.     Marland Kitchen atorvastatin (LIPITOR) 20 MG tablet Take 1 tablet (20 mg  total) by mouth daily. 30 tablet 5  . COD LIVER OIL PO Take 1 tablet by mouth daily.     Marland Kitchen dicyclomine (BENTYL) 20 MG tablet Take 1 tablet (20 mg total) by mouth every 6 (six) hours. (Patient taking differently: Take 20 mg by mouth 2 (two) times daily. ) 120 tablet 0  . escitalopram (LEXAPRO) 10 MG tablet TAKE 1 TABLET BY MOUTH DAILY 30 tablet 0  . fenofibrate (TRICOR) 145 MG tablet TAKE 1 TABLET(130 MG) BY MOUTH DAILY BEFORE BREAKFAST 30 tablet 0  . furosemide (LASIX) 40 MG tablet Take 1 tablet (40 mg total) by mouth 2 (two) times daily as needed. Take 1 in the am and 1 in midafternoon for edema and BP as needed 60 tablet 11  . Garlic Oil 7124 MG CAPS Take 1 capsule by mouth daily.     . hydrochlorothiazide (HYDRODIURIL) 25 MG tablet TAKE 1 TABLET(25 MG) BY MOUTH DAILY 30 tablet 0  . HYDROcodone-acetaminophen (NORCO) 10-325 MG tablet Take 1 tablet 2  (two) times daily as needed by mouth. 30 tablet 0  . latanoprost (XALATAN) 0.005 % ophthalmic solution Place 1 drop into both eyes nightly.    . metFORMIN (GLUCOPHAGE) 500 MG tablet TAKE 1 TABLET(500 MG) BY MOUTH TWICE DAILY WITH A MEAL 180 tablet 0  . Multiple Vitamins-Minerals (ONE-A-DAY MENS HEALTH FORMULA) TABS Take 1 tablet by mouth daily.    . ONE TOUCH ULTRA TEST test strip USE AS DIRECTED TO CHECK BLOOD SUGAR TWICE DAILY 100 each 0  . ONETOUCH DELICA LANCETS 58K MISC USE TO TEST BLOOD SUGAR TWICE DAILY AS DIRECTED 100 each 6  . promethazine (PHENERGAN) 25 MG tablet TAKE ONE (1) TABLET(S) EVERY FOUR (4) HOURS AS NEEDED FOR NAUSEA 60 tablet 0  . rOPINIRole (REQUIP) 1 MG tablet TAKE 1 TO 2 TABLETS BY MOUTH AT BEDTIME 60 tablet 0  . tamsulosin (FLOMAX) 0.4 MG CAPS capsule TAKE 1 CAPSULE BY MOUTH DAILY AFTER DINNER 30 capsule 0  . indomethacin (INDOCIN SR) 75 MG CR capsule Take 1 capsule (75 mg total) by mouth 3 (three) times daily as needed. 90 capsule 6  . metoprolol succinate (TOPROL-XL) 25 MG 24 hr tablet Take 1 tablet (25 mg total) by mouth daily. NEED OV. 7 tablet 0  . metoprolol tartrate (LOPRESSOR) 25 MG tablet Take 25 mg by mouth 2 (two) times daily.     No facility-administered medications prior to visit.     No Known Allergies  Review of Systems  Constitutional: Negative for fever and malaise/fatigue.  HENT: Negative for congestion.   Eyes: Negative for blurred vision.  Respiratory: Negative for shortness of breath.   Cardiovascular: Negative for chest pain, palpitations and leg swelling.  Gastrointestinal: Negative for abdominal pain, blood in stool and nausea.  Genitourinary: Negative for dysuria and frequency.  Musculoskeletal: Negative for falls.  Skin: Negative for rash.  Neurological: Negative for dizziness, loss of consciousness and headaches.  Endo/Heme/Allergies: Negative for environmental allergies.  Psychiatric/Behavioral: Negative for depression. The patient is  not nervous/anxious.        Objective:    Physical Exam  Constitutional: He is oriented to person, place, and time. He appears well-developed and well-nourished. No distress.  HENT:  Head: Normocephalic and atraumatic.  Nose: Nose normal.  Eyes: Right eye exhibits no discharge. Left eye exhibits no discharge.  Neck: Normal range of motion. Neck supple.  Cardiovascular: Normal rate and regular rhythm.  No murmur heard. Pulmonary/Chest: Effort normal and breath sounds normal.  Abdominal: Soft. Bowel sounds are normal. There is no tenderness.  Musculoskeletal: He exhibits no edema.  Neurological: He is alert and oriented to person, place, and time.  Skin: Skin is warm and dry.  Psychiatric: He has a normal mood and affect.  Nursing note and vitals reviewed.   BP 118/70 (BP Location: Left Arm, Patient Position: Sitting, Cuff Size: Normal)   Pulse (!) 59   Temp 98.2 F (36.8 C) (Oral)   Resp 18   Ht 6\' 2"  (1.88 m)   Wt 214 lb 12.8 oz (97.4 kg)   SpO2 97%   BMI 27.58 kg/m  Wt Readings from Last 3 Encounters:  02/04/18 214 lb 12.8 oz (97.4 kg)  11/09/17 200 lb (90.7 kg)  10/08/17 200 lb (90.7 kg)   BP Readings from Last 3 Encounters:  02/04/18 118/70  11/09/17 139/77  10/08/17 104/69     Immunization History  Administered Date(s) Administered  . Td 12/04/1997  . Tdap 01/30/2012    Health Maintenance  Topic Date Due  . URINE MICROALBUMIN  10/14/2016  . FOOT EXAM  05/18/2017  . PNA vac Low Risk Adult (2 of 2 - PPSV23) 06/26/2018 (Originally 12/23/2016)  . OPHTHALMOLOGY EXAM  03/27/2018  . HEMOGLOBIN A1C  04/07/2018  . TETANUS/TDAP  01/29/2022  . COLONOSCOPY  06/12/2022  . Hepatitis C Screening  Completed  . INFLUENZA VACCINE  Discontinued    Lab Results  Component Value Date   WBC 5.6 10/08/2017   HGB 14.2 10/08/2017   HCT 44.0 10/08/2017   PLT 263.0 10/08/2017   GLUCOSE 106 (H) 10/08/2017   CHOL 168 10/08/2017   TRIG 209.0 (H) 10/08/2017   HDL 36.60 (L)  10/08/2017   LDLDIRECT 96.0 10/08/2017   LDLCALC NOT CALC 12/24/2015   ALT 17 10/08/2017   AST 19 10/08/2017   NA 137 10/08/2017   K 4.0 10/08/2017   CL 102 10/08/2017   CREATININE 1.90 (H) 10/08/2017   BUN 23 10/08/2017   CO2 26 10/08/2017   TSH 3.40 10/08/2017   PSA 7.46 (H) 09/28/2016   HGBA1C 6.0 10/08/2017   MICROALBUR 1.7 10/15/2015    Lab Results  Component Value Date   TSH 3.40 10/08/2017   Lab Results  Component Value Date   WBC 5.6 10/08/2017   HGB 14.2 10/08/2017   HCT 44.0 10/08/2017   MCV 79.3 10/08/2017   PLT 263.0 10/08/2017   Lab Results  Component Value Date   NA 137 10/08/2017   K 4.0 10/08/2017   CO2 26 10/08/2017   GLUCOSE 106 (H) 10/08/2017   BUN 23 10/08/2017   CREATININE 1.90 (H) 10/08/2017   BILITOT 0.3 10/08/2017   ALKPHOS 50 10/08/2017   AST 19 10/08/2017   ALT 17 10/08/2017   PROT 8.1 10/08/2017   ALBUMIN 4.1 10/08/2017   CALCIUM 10.2 10/08/2017   GFR 44.94 (L) 10/08/2017   Lab Results  Component Value Date   CHOL 168 10/08/2017   Lab Results  Component Value Date   HDL 36.60 (L) 10/08/2017   Lab Results  Component Value Date   LDLCALC NOT CALC 12/24/2015   Lab Results  Component Value Date   TRIG 209.0 (H) 10/08/2017   Lab Results  Component Value Date   CHOLHDL 5 10/08/2017   Lab Results  Component Value Date   HGBA1C 6.0 10/08/2017         Assessment & Plan:   Problem List Items Addressed This Visit    Vitamin D deficiency  Check level today.consider supplements      Hyperlipidemia    Tolerating statin, encouraged heart healthy diet, avoid trans fats, minimize simple carbs and saturated fats. Increase exercise as tolerated      Relevant Medications   metoprolol succinate (TOPROL-XL) 25 MG 24 hr tablet   Other Relevant Orders   Lipid panel   Essential hypertension    Well controlled, no changes to meds. Encouraged heart healthy diet such as the DASH diet and exercise as tolerated.        Relevant Medications   metoprolol succinate (TOPROL-XL) 25 MG 24 hr tablet   Other Relevant Orders   CBC   CBC   Comprehensive metabolic panel   TSH   Urinary frequency - Primary   Relevant Orders   Urinalysis   Urine Culture   PSA   Urine Microalbumin w/creat. ratio   Gout    Recent flare in right great toe but better now.       Relevant Orders   Uric acid   Shoulder pain, bilateral    Right shoulder bothering him enough to limit his ADLs at times. Has trouble picking things up, sleeping on that side and reaching over his head. Declines Ortho referral for now will call when he is ready      Non-insulin treated type 2 diabetes mellitus ([place])    hgba1c acceptable, minimize simple carbs. Increase exercise as tolerated. Continue current meds. Sugar this am was 100, numbers have generally they been between 90 to 110 fasting      Relevant Orders   Hemoglobin A1c   Preventative health care    Patient encouraged to maintain heart healthy diet, regular exercise, adequate sleep. Consider daily probiotics. Take medications as prescribed. Given and reviewed copy of ACP documents from [place] and encouraged to complete and return      Chronic renal insufficiency    Encouraged increased hydration and avoid NSAIDs         I have discontinued Issachar L. Sassaman's indomethacin and metoprolol tartrate. I have also changed his metoprolol succinate. Additionally, I am having him maintain his aspirin, Garlic Oil, COD LIVER OIL PO, promethazine, ONE-A-DAY MENS HEALTH FORMULA, furosemide, dicyclomine, atorvastatin, ONETOUCH DELICA LANCETS 86V, latanoprost, HYDROcodone-acetaminophen, metFORMIN, ONE TOUCH ULTRA TEST, hydrochlorothiazide, rOPINIRole, tamsulosin, fenofibrate, and escitalopram.  Meds ordered this encounter  Medications  . metoprolol succinate (TOPROL-XL) 25 MG 24 hr tablet    Sig: Take 1 tablet (25 mg total) by mouth daily.    Dispense:  30 tablet    Refill:  0     CMA served as scribe during this visit. History, Physical and Plan performed by medical provider. Documentation and orders reviewed and attested to.  Penni Homans, MD

## 2018-02-04 NOTE — Assessment & Plan Note (Signed)
Recent flare in right great toe but better now.

## 2018-02-04 NOTE — Assessment & Plan Note (Signed)
Patient encouraged to maintain heart healthy diet, regular exercise, adequate sleep. Consider daily probiotics. Take medications as prescribed. Given and reviewed copy of ACP documents from [place] Secretary of State and encouraged to complete and return 

## 2018-02-04 NOTE — Patient Instructions (Addendum)
Tylenol/Acetaminophen ES 500 mg tabs, up to 2 tabs three times daily is your best choice for pain  Pneumovax and Prevnar are the two pneumonia shots recommended take them a year apart Shingrix is the new shingles shot, 2 shots over 2-6 months, at [place] 65 Years and Older, Male Preventive care refers to lifestyle choices and visits with your health care provider that can promote health and wellness. What does preventive care include?  A yearly physical exam. This is also called an annual well check.  Dental exams once or twice a year.  Routine eye exams. Ask your health care provider how often you should have your eyes checked.  Personal lifestyle choices, including: ? Daily care of your teeth and gums. ? Regular physical activity. ? Eating a healthy diet. ? Avoiding tobacco and drug use. ? Limiting alcohol use. ? Practicing safe sex. ? Taking low doses of aspirin every day. ? Taking vitamin and mineral supplements as recommended by your health care provider. What happens during an annual well check? The services and screenings done by your health care provider during your annual well check will depend on your age, overall health, lifestyle risk factors, and family history of disease. Counseling Your health care provider may ask you questions about your:  Alcohol use.  Tobacco use.  Drug use.  Emotional well-being.  Home and relationship well-being.  Sexual activity.  Eating habits.  History of falls.  Memory and ability to understand (cognition).  Work and work Statistician.  Screening You may have the following tests or measurements:  Height, weight, and BMI.  Blood pressure.  Lipid and cholesterol levels. These may be checked every 5 years, or more frequently if you are over 68 years old.  Skin check.  Lung cancer screening. You may have this screening every year starting at age 42 if you have a 30-pack-year history of smoking and  currently smoke or have quit within the past 15 years.  Fecal occult blood test (FOBT) of the stool. You may have this test every year starting at age 66.  Flexible sigmoidoscopy or colonoscopy. You may have a sigmoidoscopy every 5 years or a colonoscopy every 10 years starting at age 69.  Prostate cancer screening. Recommendations will vary depending on your family history and other risks.  Hepatitis C blood test.  Hepatitis B blood test.  Sexually transmitted disease (STD) testing.  Diabetes screening. This is done by checking your blood sugar (glucose) after you have not eaten for a while (fasting). You may have this done every 1-3 years.  Abdominal aortic aneurysm (AAA) screening. You may need this if you are a current or former smoker.  Osteoporosis. You may be screened starting at age 59 if you are at high risk.  Talk with your health care provider about your test results, treatment options, and if necessary, the need for more tests. Vaccines Your health care provider may recommend certain vaccines, such as:  Influenza vaccine. This is recommended every year.  Tetanus, diphtheria, and acellular pertussis (Tdap, Td) vaccine. You may need a Td booster every 10 years.  Varicella vaccine. You may need this if you have not been vaccinated.  Zoster vaccine. You may need this after age 45.  Measles, mumps, and rubella (MMR) vaccine. You may need at least one dose of MMR if you were born in 1957 or later. You may also need a second dose.  Pneumococcal 13-valent conjugate (PCV13) vaccine. One dose is recommended after  age 8.  Pneumococcal polysaccharide (PPSV23) vaccine. One dose is recommended after age 31.  Meningococcal vaccine. You may need this if you have certain conditions.  Hepatitis A vaccine. You may need this if you have certain conditions or if you travel or work in places where you may be exposed to hepatitis A.  Hepatitis B vaccine. You may need this if you have  certain conditions or if you travel or work in places where you may be exposed to hepatitis B.  Haemophilus influenzae type b (Hib) vaccine. You may need this if you have certain risk factors.  Talk to your health care provider about which screenings and vaccines you need and how often you need them. This information is not intended to replace advice given to you by your health care provider. Make sure you discuss any questions you have with your health care provider. Document Released: 12/17/2015 Document Revised: 08/09/2016 Document Reviewed: 09/21/2015 Elsevier Interactive Patient Education  2018 [place] are compounds that affect the level of uric acid in your body. A low-purine diet is a diet that is low in purines. Eating a low-purine diet can prevent the level of uric acid in your body from getting too high and causing gout or kidney stones or both. What do I need to know about this diet?  Choose low-purine foods. Examples of low-purine foods are listed in the next section.  Drink plenty of fluids, especially water. Fluids can help remove uric acid from your body. Try to drink 8-16 cups (1.9-3.8 L) a day.  Limit foods high in fat, especially saturated fat, as fat makes it harder for the body to get rid of uric acid. Foods high in saturated fat include pizza, cheese, ice cream, whole milk, fried foods, and gravies. Choose foods that are lower in fat and lean sources of protein. Use olive oil when cooking as it contains healthy fats that are not high in saturated fat.  Limit alcohol. Alcohol interferes with the elimination of uric acid from your body. If you are having a gout attack, avoid all alcohol.  Keep in mind that different people's bodies react differently to different foods. You will probably learn over time which foods do or do not affect you. If you discover that a food tends to cause your gout to flare up, avoid eating that food. You can more freely  enjoy foods that do not cause problems. If you have any questions about a food item, talk to your dietitian or health care provider. Which foods are low, moderate, and high in purines? The following is a list of foods that are low, moderate, and high in purines. You can eat any amount of the foods that are low in purines. You may be able to have small amounts of foods that are moderate in purines. Ask your health care provider how much of a food moderate in purines you can have. Avoid foods high in purines. Grains  Foods low in purines: Enriched white bread, pasta, rice, cake, cornbread, popcorn.  Foods moderate in purines: Whole-grain breads and cereals, wheat germ, bran, oatmeal. Uncooked oatmeal. Dry wheat bran or wheat germ.  Foods high in purines: Pancakes, [place] toast, biscuits, muffins. Vegetables  Foods low in purines: All vegetables, except those that are moderate in purines.  Foods moderate in purines: Asparagus, cauliflower, spinach, mushrooms, green peas. Fruits  All fruits are low in purines. Meats and other Protein Foods  Foods low in purines: Eggs, nuts, peanut butter.  Foods moderate in purines: 80-90% lean beef, lamb, veal, pork, poultry, fish, eggs, peanut butter, nuts. Crab, lobster, oysters, and shrimp. Cooked dried beans, peas, and lentils.  Foods high in purines: Anchovies, sardines, herring, mussels, tuna, codfish, scallops, trout, and haddock. Berniece Salines. Organ meats (such as liver or kidney). Tripe. Game meat. Goose. Sweetbreads. Dairy  All dairy foods are low in purines. Low-fat and fat-free dairy products are best because they are low in saturated fat. Beverages  Drinks low in purines: Water, carbonated beverages, tea, coffee, cocoa.  Drinks moderate in purines: Soft drinks and other drinks sweetened with high-fructose corn syrup. Juices. To find whether a food or drink is sweetened with high-fructose corn syrup, look at the ingredients list.  Drinks high in  purines: Alcoholic beverages (such as beer). Condiments  Foods low in purines: Salt, herbs, olives, pickles, relishes, vinegar.  Foods moderate in purines: Butter, margarine, oils, mayonnaise. Fats and Oils  Foods low in purines: All types, except gravies and sauces made with meat.  Foods high in purines: Gravies and sauces made with meat. Other Foods  Foods low in purines: Sugars, sweets, gelatin. Cake. Soups made without meat.  Foods moderate in purines: Meat-based or fish-based soups, broths, or bouillons. Foods and drinks sweetened with high-fructose corn syrup.  Foods high in purines: High-fat desserts (such as ice cream, cookies, cakes, pies, doughnuts, and chocolate). Contact your dietitian for more information on foods that are not listed here. This information is not intended to replace advice given to you by your health care provider. Make sure you discuss any questions you have with your health care provider. Document Released: 03/17/2011 Document Revised: 04/27/2016 Document Reviewed: 10/27/2013 Elsevier Interactive Patient Education  2017 [employer].

## 2018-02-04 NOTE — Assessment & Plan Note (Signed)
Encouraged increased hydration and avoid NSAIDs

## 2018-02-04 NOTE — Assessment & Plan Note (Signed)
Check level today.consider supplements

## 2018-02-04 NOTE — Assessment & Plan Note (Signed)
Right shoulder bothering him enough to limit his ADLs at times. Has trouble picking things up, sleeping on that side and reaching over his head. Declines Ortho referral for now will call when he is ready

## 2018-02-04 NOTE — Assessment & Plan Note (Addendum)
hgba1c acceptable, minimize simple carbs. Increase exercise as tolerated. Continue current meds. Sugar this am was 100, numbers have generally they been between 90 to 110 fasting

## 2018-02-05 ENCOUNTER — Other Ambulatory Visit (INDEPENDENT_AMBULATORY_CARE_PROVIDER_SITE_OTHER): Payer: Medicare Other

## 2018-02-05 DIAGNOSIS — I10 Essential (primary) hypertension: Secondary | ICD-10-CM

## 2018-02-05 DIAGNOSIS — R350 Frequency of micturition: Secondary | ICD-10-CM | POA: Diagnosis not present

## 2018-02-05 DIAGNOSIS — E7849 Other hyperlipidemia: Secondary | ICD-10-CM | POA: Diagnosis not present

## 2018-02-05 DIAGNOSIS — M1A0710 Idiopathic chronic gout, right ankle and foot, without tophus (tophi): Secondary | ICD-10-CM | POA: Diagnosis not present

## 2018-02-05 DIAGNOSIS — E119 Type 2 diabetes mellitus without complications: Secondary | ICD-10-CM | POA: Diagnosis not present

## 2018-02-05 DIAGNOSIS — E559 Vitamin D deficiency, unspecified: Secondary | ICD-10-CM | POA: Diagnosis not present

## 2018-02-05 LAB — COMPREHENSIVE METABOLIC PANEL
ALBUMIN: 4 g/dL (ref 3.5–5.2)
ALT: 23 U/L (ref 0–53)
AST: 24 U/L (ref 0–37)
Alkaline Phosphatase: 50 U/L (ref 39–117)
BILIRUBIN TOTAL: 0.4 mg/dL (ref 0.2–1.2)
BUN: 22 mg/dL (ref 6–23)
CALCIUM: 10.4 mg/dL (ref 8.4–10.5)
CO2: 26 meq/L (ref 19–32)
CREATININE: 1.84 mg/dL — AB (ref 0.40–1.50)
Chloride: 102 mEq/L (ref 96–112)
GFR: 46.6 mL/min — AB (ref >60.00)
Glucose, Bld: 113 mg/dL — ABNORMAL HIGH (ref 70–99)
Potassium: 4.2 mEq/L (ref 3.5–5.1)
Sodium: 136 mEq/L (ref 135–145)
Total Protein: 7.9 g/dL (ref 6.0–8.3)

## 2018-02-05 LAB — CBC
HCT: 43.5 % (ref 39.0–52.0)
HEMOGLOBIN: 14.2 g/dL (ref 13.0–17.0)
MCHC: 32.7 g/dL (ref 30.0–36.0)
MCV: 77.6 fl — AB (ref 78.0–100.0)
PLATELETS: 244 10*3/uL (ref 150.0–400.0)
RBC: 5.6 Mil/uL (ref 4.22–5.81)
RDW: 16.6 % — ABNORMAL HIGH (ref 11.5–15.5)
WBC: 5.8 10*3/uL (ref 4.0–10.5)

## 2018-02-05 LAB — MICROALBUMIN / CREATININE URINE RATIO
Creatinine,U: 114.7 mg/dL
Microalb Creat Ratio: 0.6 mg/g (ref 0.0–30.0)

## 2018-02-05 LAB — LIPID PANEL
CHOLESTEROL: 166 mg/dL (ref 0–200)
HDL: 38.1 mg/dL — AB (ref >39.00)
LDL Cholesterol: 89 mg/dL (ref 0–99)
NonHDL: 128.37
TRIGLYCERIDES: 197 mg/dL — AB (ref 0.0–149.0)
Total CHOL/HDL Ratio: 4
VLDL: 39.4 mg/dL (ref 0.0–40.0)

## 2018-02-05 LAB — TSH: TSH: 3.2 u[IU]/mL (ref 0.35–4.50)

## 2018-02-05 LAB — HEMOGLOBIN A1C: Hgb A1c MFr Bld: 6.1 % (ref 4.6–6.5)

## 2018-02-05 LAB — VITAMIN D 25 HYDROXY (VIT D DEFICIENCY, FRACTURES): VITD: 45.07 ng/mL (ref 30.00–100.00)

## 2018-02-05 LAB — URIC ACID: URIC ACID, SERUM: 7.5 mg/dL (ref 4.0–7.8)

## 2018-02-05 LAB — PSA: PSA: 9.1 ng/mL — AB (ref 0.10–4.00)

## 2018-02-06 LAB — URINE CULTURE
MICRO NUMBER:: 90281455
SPECIMEN QUALITY:: ADEQUATE

## 2018-02-09 ENCOUNTER — Other Ambulatory Visit: Payer: Self-pay | Admitting: Family Medicine

## 2018-02-12 ENCOUNTER — Other Ambulatory Visit: Payer: Self-pay | Admitting: Family Medicine

## 2018-02-12 DIAGNOSIS — R972 Elevated prostate specific antigen [PSA]: Secondary | ICD-10-CM

## 2018-02-15 ENCOUNTER — Telehealth: Payer: Self-pay

## 2018-02-15 NOTE — Telephone Encounter (Signed)
Copied from [place]. Topic: Referral - Medical Records >> Feb 15, 2018 10:27 AM Hewitt Shorts wrote: Reason for CRM:  [facility] urology is calling stating that we had sent over to them a referral  and they still have not gotten the elevated PSA yet and they can not schedule the referral until they get that lab work   Best number to [facility] urology is [phone] and fax number is [phone]

## 2018-02-17 ENCOUNTER — Other Ambulatory Visit: Payer: Self-pay | Admitting: Family Medicine

## 2018-02-17 DIAGNOSIS — E559 Vitamin D deficiency, unspecified: Secondary | ICD-10-CM

## 2018-02-17 DIAGNOSIS — M109 Gout, unspecified: Secondary | ICD-10-CM

## 2018-02-17 DIAGNOSIS — G2581 Restless legs syndrome: Secondary | ICD-10-CM

## 2018-02-17 DIAGNOSIS — N529 Male erectile dysfunction, unspecified: Secondary | ICD-10-CM

## 2018-02-17 DIAGNOSIS — E118 Type 2 diabetes mellitus with unspecified complications: Secondary | ICD-10-CM

## 2018-02-17 DIAGNOSIS — R972 Elevated prostate specific antigen [PSA]: Secondary | ICD-10-CM

## 2018-02-17 DIAGNOSIS — D649 Anemia, unspecified: Secondary | ICD-10-CM

## 2018-02-18 ENCOUNTER — Other Ambulatory Visit: Payer: Self-pay | Admitting: Family Medicine

## 2018-02-19 ENCOUNTER — Telehealth: Payer: Self-pay | Admitting: Family Medicine

## 2018-02-19 NOTE — Telephone Encounter (Signed)
Copied from [place]. Topic: Quick Communication - See Telephone Encounter >> Feb 19, 2018 12:37 PM Rosalin Hawking wrote: CRM for notification. See Telephone encounter for:  02/19/18.   Pt dropped off document to be filled out by provider (8 pages of [place] paperwork- document in a small white envelope) Pt would like to be called at [phone] when document ready to pick up. Document put at front office tray under providers name.

## 2018-02-20 IMAGING — DX DG NECK SOFT TISSUE
2 series · 2 of 2 positions shown · non-contrast
Comparison: None.

CLINICAL DATA: Bilateral arm tingling and numbness for several
months, initial encounter

EXAM:
NECK SOFT TISSUES - 1+ VIEW

[neck lat]
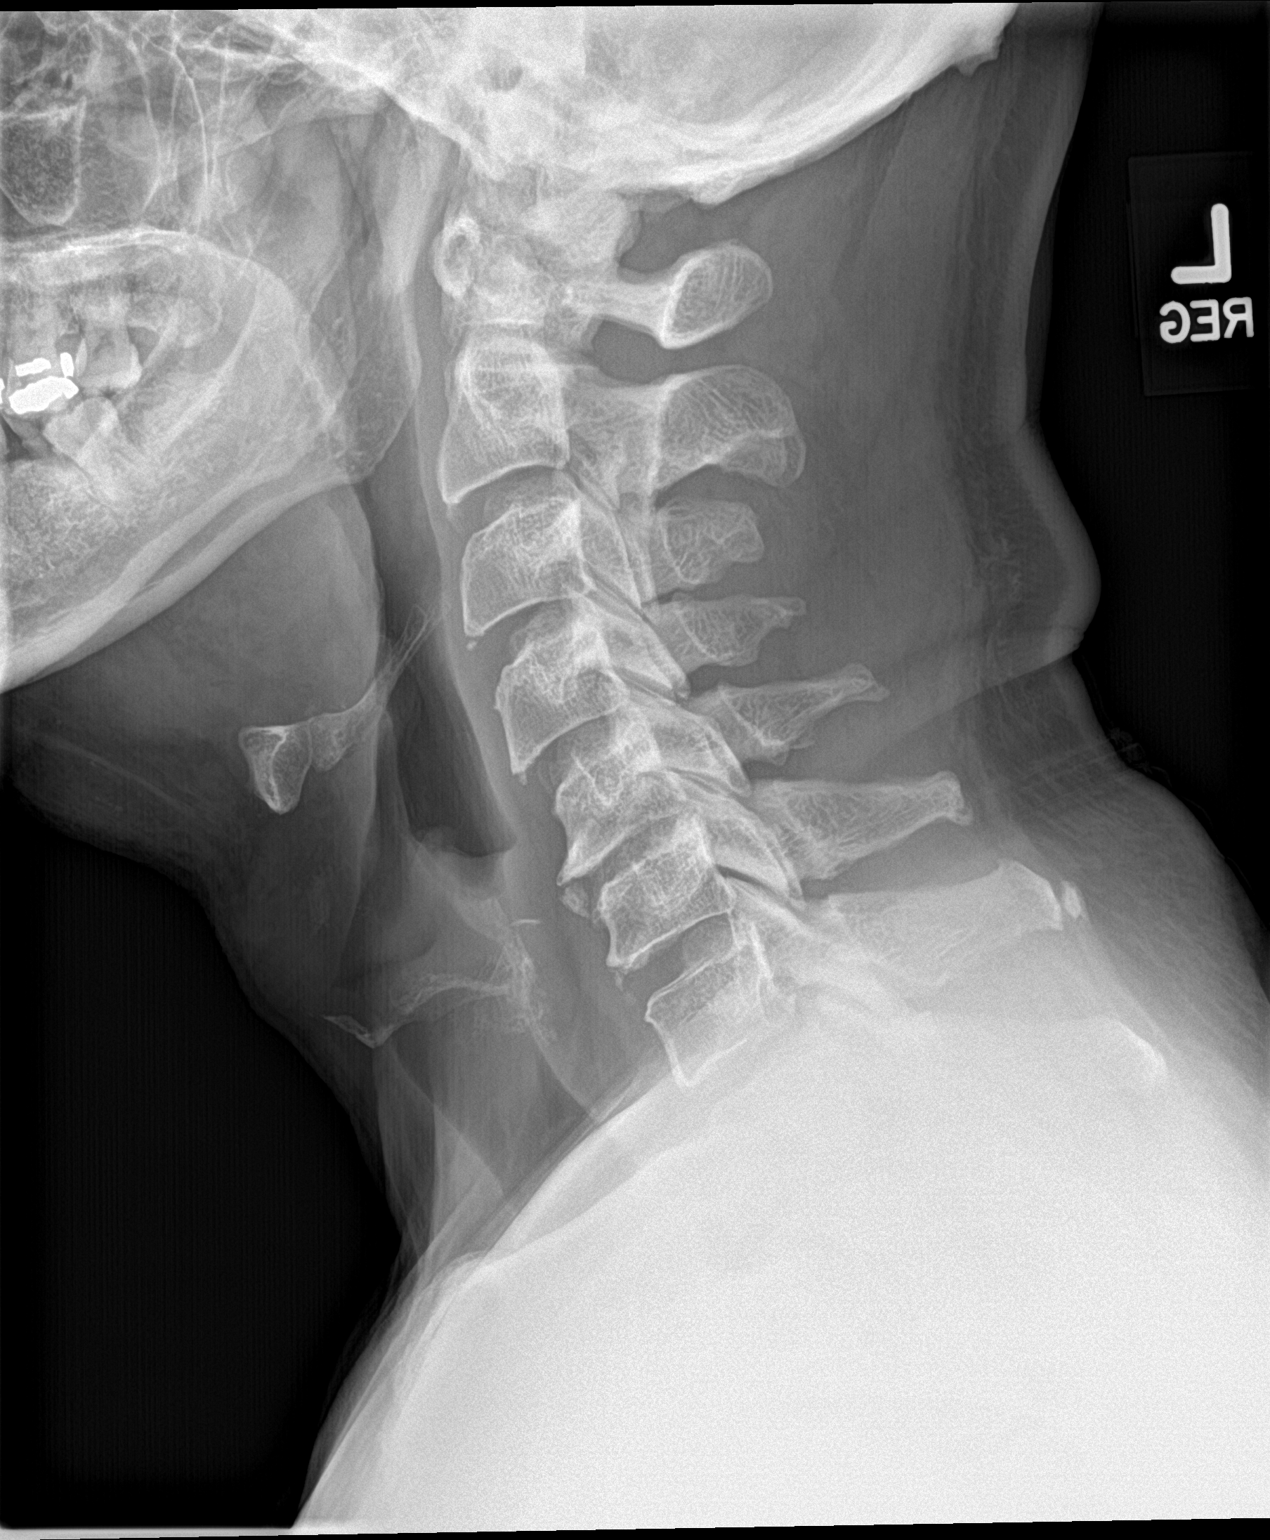

[neck ap]
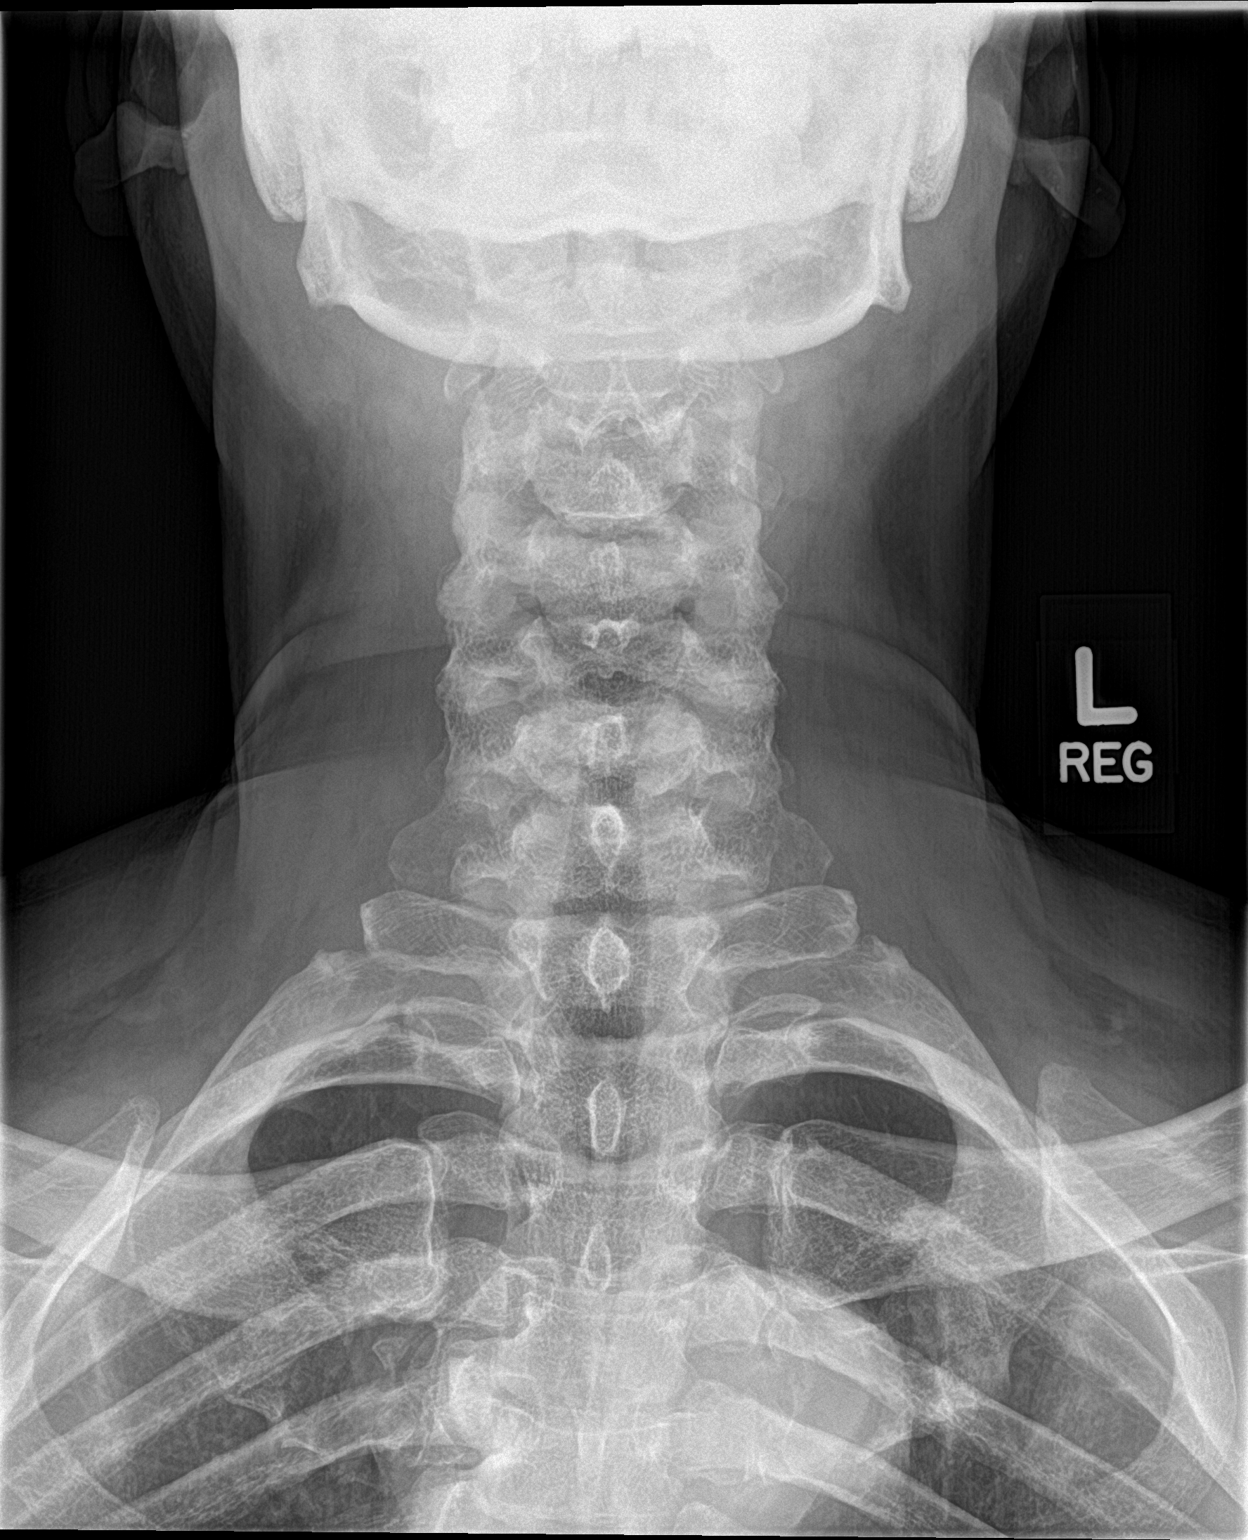

[2 of 2 positions shown; findings below may reference images not displayed]

FINDINGS: Seven cervical segments are well visualized. Mild osteophytic
changes are seen. No soft tissue abnormality is seen.
IMPRESSION: Mild degenerative change of the cervical spine. No acute soft tissue
abnormality is noted.

## 2018-02-21 ENCOUNTER — Other Ambulatory Visit: Payer: Self-pay | Admitting: Family Medicine

## 2018-02-23 ENCOUNTER — Other Ambulatory Visit: Payer: Self-pay | Admitting: Family Medicine

## 2018-02-23 DIAGNOSIS — R972 Elevated prostate specific antigen [PSA]: Secondary | ICD-10-CM

## 2018-02-23 DIAGNOSIS — E559 Vitamin D deficiency, unspecified: Secondary | ICD-10-CM

## 2018-02-23 DIAGNOSIS — N529 Male erectile dysfunction, unspecified: Secondary | ICD-10-CM

## 2018-02-23 DIAGNOSIS — G2581 Restless legs syndrome: Secondary | ICD-10-CM

## 2018-02-23 DIAGNOSIS — D649 Anemia, unspecified: Secondary | ICD-10-CM

## 2018-02-23 DIAGNOSIS — M109 Gout, unspecified: Secondary | ICD-10-CM

## 2018-02-23 DIAGNOSIS — E118 Type 2 diabetes mellitus with unspecified complications: Secondary | ICD-10-CM

## 2018-02-27 NOTE — Telephone Encounter (Signed)
Patient called to see if the paperwork he dropped off are ready for him to pick up from Dr. Charlett Blake? Please call patient when this is ready for pick-up, thanks.

## 2018-02-27 NOTE — Telephone Encounter (Signed)
I do not have any paperwork for anyone. May have already been done but do so many forms cannot say for sure. Please check office.

## 2018-02-27 NOTE — Telephone Encounter (Signed)
Patient called today wanting to know when his paperwork will be ready for pick up. Patient dropped off forms on 02/19/18.

## 2018-02-28 NOTE — Telephone Encounter (Signed)
Forms have not been completed as of yet. Daniel Gates did express delay in turn around in paperwork at this time.  Patient notified

## 2018-02-28 NOTE — Telephone Encounter (Signed)
LMOM with contact name and number for return call, if needed RE: provider out of office until Monday, 03/04/18 and also, if he could please come by the office to complete applicant portion of paperwork tomorrow [needs to answer questions and sign & date Sections #1,2,& 3]; will place at front in case pt comes in when I am not here/SLS 03/28

## 2018-03-15 ENCOUNTER — Ambulatory Visit (INDEPENDENT_AMBULATORY_CARE_PROVIDER_SITE_OTHER): Payer: Medicare Other | Admitting: Family Medicine

## 2018-03-15 DIAGNOSIS — Z85038 Personal history of other malignant neoplasm of large intestine: Secondary | ICD-10-CM

## 2018-03-15 DIAGNOSIS — R739 Hyperglycemia, unspecified: Secondary | ICD-10-CM

## 2018-03-15 DIAGNOSIS — M545 Low back pain: Secondary | ICD-10-CM | POA: Diagnosis not present

## 2018-03-15 DIAGNOSIS — I10 Essential (primary) hypertension: Secondary | ICD-10-CM

## 2018-03-15 DIAGNOSIS — N189 Chronic kidney disease, unspecified: Secondary | ICD-10-CM | POA: Diagnosis not present

## 2018-03-15 NOTE — Progress Notes (Signed)
Subjective:  I acted as a Education administrator for Dr. Charlett Blake. [place], [place]  Patient ID: Daniel Gates, male    DOB: 06-29-1946, 72 y.o.   MRN: [id number]  No chief complaint on file.   HPI  Patient is in today to fill paperwork out for disability. He feels well today. No recent febrile illness or hospitalizations. He has daily pain and debility most notably in his low back. He was diagnosed with colon cancer and underwent surgery and chemotherapy at that time. He has been fully disabled from work since that time. His bowels are moving well and his appetite is good. Denies CP/palp/SOB/HA/congestion/fevers/GI or GU c/o. Taking meds as prescribed  Patient Care Team: [name], MD as PCP - General (Family Medicine) Linward Natal, MD as Consulting Physician (Ophthalmology)   Past Medical History:  Diagnosis Date  . Allergic rhinitis   . Anxiety 2003   "after cancer surgery"  . Asthma    childhood per pt.  . Bradycardia 10/01/2016  . Cancer ([place])    colon; diagnosed 2003  . Cataract 12/29/2016   not sure which eye  . Chronic renal insufficiency 06/07/2017  . Depression   . Diabetes type 2, controlled ([place]) 06/27/2012  . Glaucoma   . Gout   . Hyperlipidemia   . Hypertension   . Medicare annual wellness visit, subsequent 02/15/2014  . Open-angle glaucoma 12/29/2016  . Sickle cell anemia ([place]) 06/12/2017   "they say I have a trace"  . TMJ arthralgia 01/02/2016    Past Surgical History:  Procedure Laterality Date  . COLON SURGERY  2003   for colon cancer  . COLONOSCOPY    . INGUINAL HERNIA REPAIR  1997   right    Family History  Problem Relation Age of Onset  . Diabetes Mother   . Heart disease Mother        MI  . Cancer Father        prostate  . Prostate cancer Father   . Prostate cancer Brother   . Colon cancer Neg Hx   . Stomach cancer Neg Hx   . Esophageal cancer Neg Hx   . Rectal cancer Neg Hx     Social History   Socioeconomic History  . Marital status:  Widowed    Spouse name: Not on file  . Number of children: 7  . Years of education: Not on file  . Highest education level: Not on file  Occupational History    Employer: DISABLED  Social Needs  . Financial resource strain: Not on file  . Food insecurity:    Worry: Not on file    Inability: Not on file  . Transportation needs:    Medical: Not on file    Non-medical: Not on file  Tobacco Use  . Smoking status: Never Smoker  . Smokeless tobacco: Never Used  Substance and Sexual Activity  . Alcohol use: No  . Drug use: No  . Sexual activity: Not Currently  Lifestyle  . Physical activity:    Days per week: Not on file    Minutes per session: Not on file  . Stress: Not on file  Relationships  . Social connections:    Talks on phone: Not on file    Gets together: Not on file    Attends religious service: Not on file    Active member of club or organization: Not on file    Attends meetings of clubs or organizations: Not on file  Relationship status: Not on file  . Intimate partner violence:    Fear of current or ex partner: Not on file    Emotionally abused: Not on file    Physically abused: Not on file    Forced sexual activity: Not on file  Other Topics Concern  . Not on file  Social History Narrative  . Not on file    Outpatient Medications Prior to Visit  Medication Sig Dispense Refill  . aspirin 325 MG tablet Take 325 mg by mouth daily.     Marland Kitchen atorvastatin (LIPITOR) 20 MG tablet Take 1 tablet (20 mg total) by mouth daily. 30 tablet 5  . COD LIVER OIL PO Take 1 tablet by mouth daily.     Marland Kitchen dicyclomine (BENTYL) 20 MG tablet Take 1 tablet (20 mg total) by mouth every 6 (six) hours. (Patient taking differently: Take 20 mg by mouth 2 (two) times daily. ) 120 tablet 0  . escitalopram (LEXAPRO) 10 MG tablet TAKE 1 TABLET BY MOUTH DAILY 30 tablet 0  . fenofibrate (TRICOR) 145 MG tablet TAKE 1 TABLET(130 MG) BY MOUTH DAILY BEFORE BREAKFAST 30 tablet 0  . furosemide (LASIX)  40 MG tablet Take 1 tablet (40 mg total) by mouth 2 (two) times daily as needed. Take 1 in the am and 1 in midafternoon for edema and BP as needed 60 tablet 11  . Garlic Oil 3716 MG CAPS Take 1 capsule by mouth daily.     . hydrochlorothiazide (HYDRODIURIL) 25 MG tablet TAKE 1 TABLET(25 MG) BY MOUTH DAILY 30 tablet 0  . HYDROcodone-acetaminophen (NORCO) 10-325 MG tablet Take 1 tablet 2 (two) times daily as needed by mouth. 30 tablet 0  . latanoprost (XALATAN) 0.005 % ophthalmic solution Place 1 drop into both eyes nightly.    . metFORMIN (GLUCOPHAGE) 500 MG tablet TAKE 1 TABLET(500 MG) BY MOUTH TWICE DAILY WITH A MEAL 180 tablet 0  . metoprolol succinate (TOPROL-XL) 25 MG 24 hr tablet Take 1 tablet (25 mg total) by mouth daily. 30 tablet 0  . Multiple Vitamins-Minerals (ONE-A-DAY MENS HEALTH FORMULA) TABS Take 1 tablet by mouth daily.    . ONE TOUCH ULTRA TEST test strip USE AS DIRECTED TO CHECK BLOOD SUGAR TWICE DAILY 100 each 1  . ONETOUCH DELICA LANCETS 96V MISC USE TO TEST BLOOD SUGAR TWICE DAILY AS DIRECTED 100 each 1  . promethazine (PHENERGAN) 25 MG tablet TAKE ONE (1) TABLET(S) EVERY FOUR (4) HOURS AS NEEDED FOR NAUSEA 60 tablet 0  . rOPINIRole (REQUIP) 1 MG tablet TAKE 1 TO 2 TABLETS BY MOUTH AT BEDTIME 60 tablet 0  . tamsulosin (FLOMAX) 0.4 MG CAPS capsule TAKE 1 CAPSULE BY MOUTH DAILY AFTER DINNER 30 capsule 0   No facility-administered medications prior to visit.     No Known Allergies  Review of Systems  Constitutional: Positive for malaise/fatigue. Negative for fever.  HENT: Negative for congestion.   Eyes: Negative for blurred vision.  Respiratory: Negative for shortness of breath.   Cardiovascular: Negative for chest pain, palpitations and leg swelling.  Gastrointestinal: Negative for abdominal pain, blood in stool and nausea.  Genitourinary: Negative for dysuria and frequency.  Musculoskeletal: Positive for back pain. Negative for falls.  Skin: Negative for rash.    Neurological: Negative for dizziness, loss of consciousness and headaches.  Endo/Heme/Allergies: Negative for environmental allergies.  Psychiatric/Behavioral: Negative for depression. The patient is not nervous/anxious.        Objective:    Physical Exam  Constitutional: He  is oriented to person, place, and time. He appears well-developed and well-nourished. No distress.  HENT:  Head: Normocephalic and atraumatic.  Nose: Nose normal.  Eyes: Right eye exhibits no discharge. Left eye exhibits no discharge.  Neck: Normal range of motion. Neck supple.  Cardiovascular: Normal rate and regular rhythm.  No murmur heard. Pulmonary/Chest: Effort normal and breath sounds normal.  Abdominal: Soft. Bowel sounds are normal. There is no tenderness.  Musculoskeletal: He exhibits no edema.  Neurological: He is alert and oriented to person, place, and time.  Skin: Skin is warm and dry.  Psychiatric: He has a normal mood and affect.  Nursing note and vitals reviewed.   There were no vitals taken for this visit. Wt Readings from Last 3 Encounters:  02/04/18 214 lb 12.8 oz (97.4 kg)  11/09/17 200 lb (90.7 kg)  10/08/17 200 lb (90.7 kg)   BP Readings from Last 3 Encounters:  02/04/18 118/70  11/09/17 139/77  10/08/17 104/69     Immunization History  Administered Date(s) Administered  . Td 12/04/1997  . Tdap 01/30/2012    Health Maintenance  Topic Date Due  . FOOT EXAM  05/18/2017  . PNA vac Low Risk Adult (2 of 2 - PPSV23) 06/26/2018 (Originally 12/23/2016)  . OPHTHALMOLOGY EXAM  03/27/2018  . HEMOGLOBIN A1C  08/08/2018  . URINE MICROALBUMIN  02/06/2019  . TETANUS/TDAP  01/29/2022  . COLONOSCOPY  06/12/2022  . Hepatitis C Screening  Completed  . INFLUENZA VACCINE  Discontinued    Lab Results  Component Value Date   WBC 5.8 02/05/2018   HGB 14.2 02/05/2018   HCT 43.5 02/05/2018   PLT 244.0 02/05/2018   GLUCOSE 113 (H) 02/05/2018   CHOL 166 02/05/2018   TRIG 197.0 (H)  02/05/2018   HDL 38.10 (L) 02/05/2018   LDLDIRECT 96.0 10/08/2017   LDLCALC 89 02/05/2018   ALT 23 02/05/2018   AST 24 02/05/2018   NA 136 02/05/2018   K 4.2 02/05/2018   CL 102 02/05/2018   CREATININE 1.84 (H) 02/05/2018   BUN 22 02/05/2018   CO2 26 02/05/2018   TSH 3.20 02/05/2018   PSA 9.10 (H) 02/05/2018   HGBA1C 6.1 02/05/2018   MICROALBUR <0.7 02/05/2018    Lab Results  Component Value Date   TSH 3.20 02/05/2018   Lab Results  Component Value Date   WBC 5.8 02/05/2018   HGB 14.2 02/05/2018   HCT 43.5 02/05/2018   MCV 77.6 (L) 02/05/2018   PLT 244.0 02/05/2018   Lab Results  Component Value Date   NA 136 02/05/2018   K 4.2 02/05/2018   CO2 26 02/05/2018   GLUCOSE 113 (H) 02/05/2018   BUN 22 02/05/2018   CREATININE 1.84 (H) 02/05/2018   BILITOT 0.4 02/05/2018   ALKPHOS 50 02/05/2018   AST 24 02/05/2018   ALT 23 02/05/2018   PROT 7.9 02/05/2018   ALBUMIN 4.0 02/05/2018   CALCIUM 10.4 02/05/2018   GFR 46.60 (L) 02/05/2018   Lab Results  Component Value Date   CHOL 166 02/05/2018   Lab Results  Component Value Date   HDL 38.10 (L) 02/05/2018   Lab Results  Component Value Date   LDLCALC 89 02/05/2018   Lab Results  Component Value Date   TRIG 197.0 (H) 02/05/2018   Lab Results  Component Value Date   CHOLHDL 4 02/05/2018   Lab Results  Component Value Date   HGBA1C 6.1 02/05/2018         Assessment &  Plan:   Problem List Items Addressed This Visit    Essential hypertension    Well controlled, no changes to meds. Encouraged heart healthy diet such as the DASH diet and exercise as tolerated.       LOW BACK PAIN, CHRONIC    Struggles with daily pain but tries to stay as active as tolerated. Unable to do heavy lifting, repetitive bending or activities. Encouraged moist heat and gentle stretching as tolerated. May try NSAIDs and prescription meds as directed and report if symptoms worsen or seek immediate care      History of malignant  neoplasm of large intestine    He has been totally disabled since his diagnosis of colon cancer dating back to 2003. He has developed more and more medical condition since then and is unable to perform physical, strenuous or stressful labor. He is in today to have his disability paperwork updated. Spent 35 minutes in visit to review and document disability      Chronic renal insufficiency    Maintain hydration      Hyperglycemia    hgba1c acceptable, minimize simple carbs. Increase exercise as tolerated.          I am having Asahel L. Johnnye Sima maintain his aspirin, Garlic Oil, COD LIVER OIL PO, promethazine, ONE-A-DAY MENS HEALTH FORMULA, furosemide, dicyclomine, atorvastatin, latanoprost, HYDROcodone-acetaminophen, metoprolol succinate, ONETOUCH DELICA LANCETS 72Z, tamsulosin, rOPINIRole, hydrochlorothiazide, ONE TOUCH ULTRA TEST, escitalopram, fenofibrate, and metFORMIN.  No orders of the defined types were placed in this encounter.   CMA served as Education administrator during this visit. History, Physical and Plan performed by medical provider. Documentation and orders reviewed and attested to.  Penni Homans, MD

## 2018-03-15 NOTE — Patient Instructions (Signed)
Colon Mass, Adult °A colon mass is a growth in your colon. The colon is your large intestine. °What are the causes? °Many things can cause a colon mass, including: °· Cancer. °· Blood vessel problems. °· Infection. °· Inflammatory bowel disease (IBS). °· Diverticulitis. This is inflammation or infection of small pouches in your colon. °· Endometriosis. This is a condition in which the tissue that lines the uterus grows outside of its normal location. °· Volvulus. This is a condition in which the colon twists on the organ or tissue that supports the colon (mesentery). ° °What are the signs or symptoms? °Symptoms of a colon mass include: °· Cramping. °· Nausea. °· Diarrhea. °· Fever. °· Vomiting. °· Feeling weak. °· Pain in the abdomen, side, or back. °· Weight loss. °· Constipation. °· Bleeding from your rectum. °· Changes in bowel habits. °· Feeling the need for bowel a movement despite having had one recently. ° °In some cases, no symptoms are present. °How is this diagnosed? °To make a diagnosis, your health care provider will need to learn more about the mass. You may have tests or procedures done, such as: °· Blood tests. °· X-rays. °· An ultrasound. °· A CT scan. °· An MRI. °· A colonoscopy. °· A biopsy of the mass. ° °In some cases, what seemed like a colon mass may actually be something else, such as scarring that formed after a surgery or an ulcer. °How is this treated? °Treatment will depend on the cause of the mass. Your health care provider will discuss your test results with you, the meaning of the tests, and the recommended steps for starting treatment. In serious cases, emergency surgery may be recommended immediately. °Follow these instructions at home: °What you need to do at home will depend on the cause of the mass. Follow the instructions that your health care provider gives to you. In general: °· Keep all follow-up visits as directed by your health care provider. This is important. °· Take  medicines only as directed by your health care provider. ° °Contact a health care provider if: °· You develop new symptoms. °· You have a fever. °· Your pain does not get better with medicine. °· You feel weaker. °· You develop easy bruising or bleeding. °Get help right away if: °· You vomit bright red blood or material that looks like coffee grounds. °· You have blood in your stools, or your stools turn black and tarry. °· You faint. °· You feel that the mass has suddenly gotten larger. °· You develop severe bloating in your abdomen. °This information is not intended to replace advice given to you by your health care provider. Make sure you discuss any questions you have with your health care provider. °Document Released: 02/27/2007 Document Revised: 04/27/2016 Document Reviewed: 06/21/2014 °Elsevier Interactive Patient Education © 2018 [employer] ° °

## 2018-03-17 DIAGNOSIS — R739 Hyperglycemia, unspecified: Secondary | ICD-10-CM | POA: Insufficient documentation

## 2018-03-17 NOTE — Assessment & Plan Note (Signed)
Well controlled, no changes to meds. Encouraged heart healthy diet such as the DASH diet and exercise as tolerated.  °

## 2018-03-17 NOTE — Assessment & Plan Note (Signed)
He has been totally disabled since his diagnosis of colon cancer dating back to 2003. He has developed more and more medical condition since then and is unable to perform physical, strenuous or stressful labor. He is in today to have his disability paperwork updated. Spent 35 minutes in visit to review and document disability

## 2018-03-17 NOTE — Assessment & Plan Note (Signed)
Struggles with daily pain but tries to stay as active as tolerated. Unable to do heavy lifting, repetitive bending or activities. Encouraged moist heat and gentle stretching as tolerated. May try NSAIDs and prescription meds as directed and report if symptoms worsen or seek immediate care

## 2018-03-17 NOTE — Assessment & Plan Note (Signed)
Maintain hydration 

## 2018-03-17 NOTE — Assessment & Plan Note (Signed)
hgba1c acceptable, minimize simple carbs. Increase exercise as tolerated.  

## 2018-03-30 ENCOUNTER — Other Ambulatory Visit: Payer: Self-pay | Admitting: Family Medicine

## 2018-03-30 DIAGNOSIS — M109 Gout, unspecified: Secondary | ICD-10-CM

## 2018-03-30 DIAGNOSIS — N529 Male erectile dysfunction, unspecified: Secondary | ICD-10-CM

## 2018-03-30 DIAGNOSIS — E118 Type 2 diabetes mellitus with unspecified complications: Secondary | ICD-10-CM

## 2018-03-30 DIAGNOSIS — E559 Vitamin D deficiency, unspecified: Secondary | ICD-10-CM

## 2018-03-30 DIAGNOSIS — R972 Elevated prostate specific antigen [PSA]: Secondary | ICD-10-CM

## 2018-03-30 DIAGNOSIS — D649 Anemia, unspecified: Secondary | ICD-10-CM

## 2018-03-30 DIAGNOSIS — G2581 Restless legs syndrome: Secondary | ICD-10-CM

## 2018-04-05 ENCOUNTER — Telehealth: Payer: Self-pay | Admitting: *Deleted

## 2018-04-05 NOTE — Telephone Encounter (Signed)
Opened in Error/SLS

## 2018-04-20 ENCOUNTER — Other Ambulatory Visit: Payer: Self-pay | Admitting: Family Medicine

## 2018-05-06 ENCOUNTER — Other Ambulatory Visit: Payer: Self-pay | Admitting: Family Medicine

## 2018-05-06 DIAGNOSIS — M109 Gout, unspecified: Secondary | ICD-10-CM

## 2018-05-06 DIAGNOSIS — D649 Anemia, unspecified: Secondary | ICD-10-CM

## 2018-05-06 DIAGNOSIS — E118 Type 2 diabetes mellitus with unspecified complications: Secondary | ICD-10-CM

## 2018-05-06 DIAGNOSIS — R972 Elevated prostate specific antigen [PSA]: Secondary | ICD-10-CM

## 2018-05-06 DIAGNOSIS — N529 Male erectile dysfunction, unspecified: Secondary | ICD-10-CM

## 2018-05-06 DIAGNOSIS — E559 Vitamin D deficiency, unspecified: Secondary | ICD-10-CM

## 2018-05-06 DIAGNOSIS — G2581 Restless legs syndrome: Secondary | ICD-10-CM

## 2018-05-16 ENCOUNTER — Other Ambulatory Visit: Payer: Self-pay | Admitting: Family Medicine

## 2018-05-24 ENCOUNTER — Other Ambulatory Visit: Payer: Self-pay | Admitting: Family Medicine

## 2018-05-24 DIAGNOSIS — G2581 Restless legs syndrome: Secondary | ICD-10-CM

## 2018-05-24 DIAGNOSIS — M109 Gout, unspecified: Secondary | ICD-10-CM

## 2018-05-24 DIAGNOSIS — D649 Anemia, unspecified: Secondary | ICD-10-CM

## 2018-05-24 DIAGNOSIS — N529 Male erectile dysfunction, unspecified: Secondary | ICD-10-CM

## 2018-05-24 DIAGNOSIS — E118 Type 2 diabetes mellitus with unspecified complications: Secondary | ICD-10-CM

## 2018-05-24 DIAGNOSIS — E559 Vitamin D deficiency, unspecified: Secondary | ICD-10-CM

## 2018-05-24 DIAGNOSIS — R972 Elevated prostate specific antigen [PSA]: Secondary | ICD-10-CM

## 2018-05-25 ENCOUNTER — Other Ambulatory Visit: Payer: Self-pay | Admitting: Family Medicine

## 2018-06-08 ENCOUNTER — Other Ambulatory Visit: Payer: Self-pay | Admitting: Family Medicine

## 2018-06-08 DIAGNOSIS — E559 Vitamin D deficiency, unspecified: Secondary | ICD-10-CM

## 2018-06-08 DIAGNOSIS — R972 Elevated prostate specific antigen [PSA]: Secondary | ICD-10-CM

## 2018-06-08 DIAGNOSIS — E118 Type 2 diabetes mellitus with unspecified complications: Secondary | ICD-10-CM

## 2018-06-08 DIAGNOSIS — M109 Gout, unspecified: Secondary | ICD-10-CM

## 2018-06-08 DIAGNOSIS — G2581 Restless legs syndrome: Secondary | ICD-10-CM

## 2018-06-08 DIAGNOSIS — D649 Anemia, unspecified: Secondary | ICD-10-CM

## 2018-06-08 DIAGNOSIS — N529 Male erectile dysfunction, unspecified: Secondary | ICD-10-CM

## 2018-06-17 ENCOUNTER — Other Ambulatory Visit: Payer: Self-pay | Admitting: Family Medicine

## 2018-06-17 NOTE — Telephone Encounter (Signed)
Copied from [place] [id number]. Topic: Quick Communication - Rx Refill/Question >> Jun 17, 2018 12:00 PM Judyann Munson wrote: Medication: dicyclomine (BENTYL) 20 MG tablet, HYDROcodone-acetaminophen (NORCO) 10-325 MG tablet   Has the patient contacted their pharmacy? No   Preferred Pharmacy (with phone number or street name):   [facility] [place], [address] [phone] (Phone) [phone] (Fax)      Agent: Please be advised that RX refills may take up to 3 business days. We ask that you follow-up with your pharmacy.

## 2018-06-18 MED ORDER — HYDROCODONE-ACETAMINOPHEN 10-325 MG PO TABS: 1.0000 | ORAL_TABLET | Freq: Two times a day (BID) | ORAL | 0 refills | Status: DC | PRN

## 2018-06-18 NOTE — Telephone Encounter (Signed)
I will refill but warn him he will need a UDS and a contract prior to any further refills

## 2018-06-18 NOTE — Telephone Encounter (Signed)
Requesting:[place] Contract:needs one  UDS:low risk next screen 04/07/18 Last OV:03/15/18 Next OV:not scheduled  Last Refill:10/08/18  30-0rf Database:   Please advise

## 2018-06-19 ENCOUNTER — Telehealth: Payer: Self-pay

## 2018-06-19 NOTE — Telephone Encounter (Signed)
PA approved.   Request Reference Number: [id number]. HYDROCO/APAP TAB 10-325MG  is approved through 12/03/2018. For further questions, call [phone].

## 2018-06-19 NOTE — Telephone Encounter (Signed)
PA initiated via Covermymeds; KEY: [id number]. Awaiting determination.

## 2018-06-19 NOTE — Progress Notes (Addendum)
Subjective:   Daniel Gates is a 72 y.o. male who presents for Medicare Annual/Subsequent preventive examination.  Review of Systems: No ROS.  Medicare Wellness Visit. Additional risk factors are reflected in the social history. Cardiac Risk Factors include: advanced age (>65men, >87 women);diabetes mellitus;dyslipidemia;hypertension;male gender;obesity (BMI >30kg/m2);sedentary lifestyle Sleep patterns: Sleeps well. Naps during the day  Home Safety/Smoke Alarms: Feels safe in home. Smoke alarms in place.  Living environment; residence and Firearm Safety: one story home. Lives with son.   Eye-Dr.Devanzio every 3 months.    Male:   CCS-due 06/2022     PSA-  Lab Results  Component Value Date   PSA 9.10 (H) 02/05/2018   PSA 7.46 (H) 09/28/2016   PSA 6.69 (H) 06/23/2016       Objective:    Vitals: BP 130/72 (BP Location: Left Arm, Patient Position: Sitting, Cuff Size: Normal)   Pulse 60   Ht 6\' 2"  (1.88 m)   Wt 218 lb 3.2 oz (99 kg)   SpO2 96%   BMI 28.02 kg/m   Body mass index is 28.02 kg/m.  Advanced Directives 06/27/2018 08/07/2017 06/26/2017 06/12/2017 06/23/2016  Does Patient Have a Medical Advance Directive? No No No No No  Does patient want to make changes to medical advance directive? Yes (MAU/Ambulatory/Procedural Areas - Information given) - - - -  Would patient like information on creating a medical advance directive? - No - Patient declined Yes (MAU/Ambulatory/Procedural Areas - Information given) - Yes - Educational materials given    Tobacco Social History   Tobacco Use  Smoking Status Never Smoker  Smokeless Tobacco Never Used     Counseling given: Not Answered   Clinical Intake: Pain : No/denies pain   Past Medical History:  Diagnosis Date  . Allergic rhinitis   . Anxiety 2003   "after cancer surgery"  . Asthma    childhood per pt.  . Bradycardia 10/01/2016  . Cancer ([place])    colon; diagnosed 2003  . Cataract 12/29/2016   not sure which eye    . Chronic renal insufficiency 06/07/2017  . Depression   . Diabetes type 2, controlled ([place]) 06/27/2012  . Glaucoma   . Gout   . Hyperlipidemia   . Hypertension   . Medicare annual wellness visit, subsequent 02/15/2014  . Open-angle glaucoma 12/29/2016  . Sickle cell anemia ([place]) 06/12/2017   "they say I have a trace"  . TMJ arthralgia 01/02/2016   Past Surgical History:  Procedure Laterality Date  . COLON SURGERY  2003   for colon cancer  . COLONOSCOPY    . INGUINAL HERNIA REPAIR  1997   right   Family History  Problem Relation Age of Onset  . Diabetes Mother   . Heart disease Mother        MI  . Cancer Father        prostate  . Prostate cancer Father   . Prostate cancer Brother   . Colon cancer Neg Hx   . Stomach cancer Neg Hx   . Esophageal cancer Neg Hx   . Rectal cancer Neg Hx    Social History   Socioeconomic History  . Marital status: Widowed    Spouse name: Not on file  . Number of children: 7  . Years of education: Not on file  . Highest education level: Not on file  Occupational History    Employer: DISABLED  Social Needs  . Financial resource strain: Not on file  . Food insecurity:  Worry: Not on file    Inability: Not on file  . Transportation needs:    Medical: Not on file    Non-medical: Not on file  Tobacco Use  . Smoking status: Never Smoker  . Smokeless tobacco: Never Used  Substance and Sexual Activity  . Alcohol use: No  . Drug use: No  . Sexual activity: Yes  Lifestyle  . Physical activity:    Days per week: Not on file    Minutes per session: Not on file  . Stress: Not on file  Relationships  . Social connections:    Talks on phone: Not on file    Gets together: Not on file    Attends religious service: Not on file    Active member of club or organization: Not on file    Attends meetings of clubs or organizations: Not on file    Relationship status: Not on file  Other Topics Concern  . Not on file  Social History Narrative   . Not on file    Outpatient Encounter Medications as of 06/27/2018  Medication Sig  . aspirin 325 MG tablet Take 325 mg by mouth daily.   Marland Kitchen atorvastatin (LIPITOR) 20 MG tablet Take 1 tablet (20 mg total) by mouth daily.  . COD LIVER OIL PO Take 1 tablet by mouth daily.   Marland Kitchen dicyclomine (BENTYL) 20 MG tablet Take 1 tablet (20 mg total) by mouth 2 (two) times daily.  Marland Kitchen escitalopram (LEXAPRO) 10 MG tablet TAKE 1 TABLET BY MOUTH DAILY  . fenofibrate (TRICOR) 145 MG tablet TAKE 1 TABLET(130 MG) BY MOUTH DAILY BEFORE BREAKFAST  . Garlic Oil 4967 MG CAPS Take 1 capsule by mouth daily.   . hydrochlorothiazide (HYDRODIURIL) 25 MG tablet TAKE 1 TABLET(25 MG) BY MOUTH DAILY  . HYDROcodone-acetaminophen (NORCO) 10-325 MG tablet Take 1 tablet by mouth 2 (two) times daily as needed.  . latanoprost (XALATAN) 0.005 % ophthalmic solution Place 1 drop into both eyes nightly.  . metFORMIN (GLUCOPHAGE) 500 MG tablet TAKE 1 TABLET(500 MG) BY MOUTH TWICE DAILY WITH A MEAL  . metoprolol succinate (TOPROL-XL) 25 MG 24 hr tablet TAKE 1 TABLET(25 MG) BY MOUTH DAILY  . Multiple Vitamins-Minerals (ONE-A-DAY MENS HEALTH FORMULA) TABS Take 1 tablet by mouth daily.  . ONE TOUCH ULTRA TEST test strip USE AS DIRECTED TO CHECK BLOOD SUGAR TWICE DAILY  . ONETOUCH DELICA LANCETS 59F MISC USE TO TEST BLOOD SUGAR TWICE DAILY AS DIRECTED  . promethazine (PHENERGAN) 25 MG tablet TAKE ONE (1) TABLET(S) EVERY FOUR (4) HOURS AS NEEDED FOR NAUSEA  . rOPINIRole (REQUIP) 1 MG tablet TAKE 1 TO 2 TABLETS BY MOUTH AT BEDTIME  . tamsulosin (FLOMAX) 0.4 MG CAPS capsule TAKE 1 CAPSULE BY MOUTH DAILY AFTER DINNER  . furosemide (LASIX) 40 MG tablet Take 1 tablet (40 mg total) by mouth 2 (two) times daily as needed. Take 1 in the am and 1 in midafternoon for edema and BP as needed (Patient not taking: Reported on 06/27/2018)  . [DISCONTINUED] dicyclomine (BENTYL) 20 MG tablet Take 1 tablet (20 mg total) by mouth every 6 (six) hours. (Patient  taking differently: Take 20 mg by mouth 2 (two) times daily. )  . [DISCONTINUED] escitalopram (LEXAPRO) 10 MG tablet TAKE 1 TABLET BY MOUTH DAILY   No facility-administered encounter medications on file as of 06/27/2018.     Activities of Daily Living In your present state of health, do you have any difficulty performing the following activities: 06/27/2018  Hearing? N  Vision? N  Difficulty concentrating or making decisions? N  Walking or climbing stairs? N  Dressing or bathing? N  Doing errands, shopping? N  Preparing Food and eating ? N  Using the Toilet? N  In the past six months, have you accidently leaked urine? N  Do you have problems with loss of bowel control? N  Managing your Medications? N  Managing your Finances? N  Housekeeping or managing your Housekeeping? N  Some recent data might be hidden    Patient Care Team: [name], MD as PCP - General (Family Medicine) Linward Natal, MD as Consulting Physician (Ophthalmology)   Assessment:   This is a routine wellness examination for Daniel Gates. Physical assessment deferred to PCP.  Exercise Activities and Dietary recommendations Current Exercise Habits: The patient does not participate in regular exercise at present, Exercise limited by: None identified Diet (meal preparation, eat out, water intake, caffeinated beverages, dairy products, fruits and vegetables):  Breakfast: coffee, apple fritter Lunch: skipped Dinner:  Biscuit, sausage, ham. Needs to drink water  Goals    . Increase physical activity     Walk at least 3 times per week        Fall Risk Fall Risk  06/27/2018 06/26/2017 06/23/2016 12/24/2015 02/15/2014  Falls in the past year? No No No No No    Depression Screen PHQ 2/9 Scores 06/27/2018 06/26/2017 06/23/2016 12/24/2015  PHQ - 2 Score 0 0 2 0  PHQ- 9 Score - - 4 -    Cognitive Function MMSE - Mini Mental State Exam 06/27/2018 06/26/2017 06/23/2016  Orientation to time 5 5 5   Orientation to Place  5 5 5   Registration 3 3 3   Attention/ Calculation 5 5 5   Recall 2 2 2   Language- name 2 objects 2 2 2   Language- repeat 1 1 1   Language- follow 3 step command 3 3 3   Language- read & follow direction 1 1 1   Write a sentence 1 1 1   Copy design 1 1 1   Total score 29 29 29         Immunization History  Administered Date(s) Administered  . Td 12/04/1997  . Tdap 01/30/2012    Screening Tests Health Maintenance  Topic Date Due  . PNA vac Low Risk Adult (2 of 2 - PPSV23) 12/23/2016  . FOOT EXAM  05/18/2017  . OPHTHALMOLOGY EXAM  03/27/2018  . HEMOGLOBIN A1C  08/08/2018  . URINE MICROALBUMIN  02/06/2019  . TETANUS/TDAP  01/29/2022  . COLONOSCOPY  06/12/2022  . Hepatitis C Screening  Completed  . INFLUENZA VACCINE  Discontinued      Plan:    Please schedule your next medicare wellness visit with me in 1 yr.  Continue to eat heart healthy diet (full of fruits, vegetables, whole grains, lean protein, water--limit salt, fat, and sugar intake) and increase physical activity as tolerated.  Continue doing brain stimulating activities (puzzles, reading, adult coloring books, staying active) to keep memory sharp.   Bring a copy of your living will and/or healthcare power of attorney to your next office visit.    I have personally reviewed and noted the following in the patient's chart:   . Medical and social history . Use of alcohol, tobacco or illicit drugs  . Current medications and supplements . Functional ability and status . Nutritional status . Physical activity . Advanced directives . List of other physicians . Hospitalizations, surgeries, and ER visits in previous 12 months . Vitals .  Screenings to include cognitive, depression, and falls . Referrals and appointments  In addition, I have reviewed and discussed with patient certain preventive protocols, quality metrics, and best practice recommendations. A written personalized care plan for preventive services as  well as general preventive health recommendations were provided to patient.     Shela Nevin, [place]  06/27/2018  Reviewed and agree with assessment & plan of RN.  Mackie Pai, PA-C

## 2018-06-20 ENCOUNTER — Other Ambulatory Visit: Payer: Self-pay | Admitting: Family Medicine

## 2018-06-20 MED ORDER — DICYCLOMINE HCL 20 MG PO TABS: 20.0000 mg | ORAL_TABLET | Freq: Two times a day (BID) | ORAL | 0 refills | Status: DC

## 2018-06-20 NOTE — Addendum Note (Signed)
Addended by: Magdalene Molly A on: 06/20/2018 10:13 AM   Modules accepted: Orders

## 2018-06-27 ENCOUNTER — Ambulatory Visit (INDEPENDENT_AMBULATORY_CARE_PROVIDER_SITE_OTHER): Payer: Medicare Other | Admitting: *Deleted

## 2018-06-27 ENCOUNTER — Encounter: Payer: Self-pay | Admitting: *Deleted

## 2018-06-27 VITALS — BP 130/72 | HR 60 | Ht 74.0 in | Wt 218.2 lb

## 2018-06-27 DIAGNOSIS — Z0000 Encounter for general adult medical examination without abnormal findings: Secondary | ICD-10-CM

## 2018-06-27 NOTE — Patient Instructions (Signed)
Please schedule your next medicare wellness visit with me in 1 yr.  Continue to eat heart healthy diet (full of fruits, vegetables, whole grains, lean protein, water--limit salt, fat, and sugar intake) and increase physical activity as tolerated.  Continue doing brain stimulating activities (puzzles, reading, adult coloring books, staying active) to keep memory sharp.   Bring a copy of your living will and/or healthcare power of attorney to your next office visit.   [name] , Thank you for taking time to come for your Medicare Wellness Visit. I appreciate your ongoing commitment to your health goals. Please review the following plan we discussed and let me know if I can assist you in the future.   These are the goals we discussed: Goals    . Increase physical activity     Walk at least 3 times per week        This is a list of the screening recommended for you and due dates:  Health Maintenance  Topic Date Due  . Pneumonia vaccines (2 of 2 - PPSV23) 12/23/2016  . Complete foot exam   05/18/2017  . Eye exam for diabetics  03/27/2018  . Hemoglobin A1C  08/08/2018  . Urine Protein Check  02/06/2019  . Tetanus Vaccine  01/29/2022  . Colon Cancer Screening  06/12/2022  .  Hepatitis C: One time screening is recommended by Center for Disease Control  (CDC) for  adults born from [birth year] through 1966.   Completed  . Flu Shot  Discontinued    Health Maintenance, Male A healthy lifestyle and preventive care is important for your health and wellness. Ask your health care provider about what schedule of regular examinations is right for you. What should I know about weight and diet? Eat a Healthy Diet  Eat plenty of vegetables, fruits, whole grains, low-fat dairy products, and lean protein.  Do not eat a lot of foods high in solid fats, added sugars, or salt.  Maintain a Healthy Weight Regular exercise can help you achieve or maintain a healthy weight. You should:  Do at least  150 minutes of exercise each week. The exercise should increase your heart rate and make you sweat (moderate-intensity exercise).  Do strength-training exercises at least twice a week.  Watch Your Levels of Cholesterol and Blood Lipids  Have your blood tested for lipids and cholesterol every 5 years starting at 73 years of age. If you are at high risk for heart disease, you should start having your blood tested when you are 73 years old. You may need to have your cholesterol levels checked more often if: ? Your lipid or cholesterol levels are high. ? You are older than 73 years of age. ? You are at high risk for heart disease.  What should I know about cancer screening? Many types of cancers can be detected early and may often be prevented. Lung Cancer  You should be screened every year for lung cancer if: ? You are a current smoker who has smoked for at least 30 years. ? You are a former smoker who has quit within the past 15 years.  Talk to your health care provider about your screening options, when you should start screening, and how often you should be screened.  Colorectal Cancer  Routine colorectal cancer screening usually begins at 73 years of age and should be repeated every 5-10 years until you are 73 years old. You may need to be screened more often if early forms of precancerous  polyps or small growths are found. Your health care provider may recommend screening at an earlier age if you have risk factors for colon cancer.  Your health care provider may recommend using home test kits to check for hidden blood in the stool.  A small camera at the end of a tube can be used to examine your colon (sigmoidoscopy or colonoscopy). This checks for the earliest forms of colorectal cancer.  Prostate and Testicular Cancer  Depending on your age and overall health, your health care provider may do certain tests to screen for prostate and testicular cancer.  Talk to your health care  provider about any symptoms or concerns you have about testicular or prostate cancer.  Skin Cancer  Check your skin from head to toe regularly.  Tell your health care provider about any new moles or changes in moles, especially if: ? There is a change in a mole's size, shape, or color. ? You have a mole that is larger than a pencil eraser.  Always use sunscreen. Apply sunscreen liberally and repeat throughout the day.  Protect yourself by wearing long sleeves, pants, a wide-brimmed hat, and sunglasses when outside.  What should I know about heart disease, diabetes, and high blood pressure?  If you are 62-14 years of age, have your blood pressure checked every 3-5 years. If you are 81 years of age or older, have your blood pressure checked every year. You should have your blood pressure measured twice-once when you are at a hospital or clinic, and once when you are not at a hospital or clinic. Record the average of the two measurements. To check your blood pressure when you are not at a hospital or clinic, you can use: ? An automated blood pressure machine at a pharmacy. ? A home blood pressure monitor.  Talk to your health care provider about your target blood pressure.  If you are between 68-36 years old, ask your health care provider if you should take aspirin to prevent heart disease.  Have regular diabetes screenings by checking your fasting blood sugar level. ? If you are at a normal weight and have a low risk for diabetes, have this test once every three years after the age of 49. ? If you are overweight and have a high risk for diabetes, consider being tested at a younger age or more often.  A one-time screening for abdominal aortic aneurysm (AAA) by ultrasound is recommended for men aged 60-75 years who are current or former smokers. What should I know about preventing infection? Hepatitis B If you have a higher risk for hepatitis B, you should be screened for this virus. Talk  with your health care provider to find out if you are at risk for hepatitis B infection. Hepatitis C Blood testing is recommended for:  Everyone born from [birth year] through 1966.  Anyone with known risk factors for hepatitis C.  Sexually Transmitted Diseases (STDs)  You should be screened each year for STDs including gonorrhea and chlamydia if: ? You are sexually active and are younger than 73 years of age. ? You are older than 73 years of age and your health care provider tells you that you are at risk for this type of infection. ? Your sexual activity has changed since you were last screened and you are at an increased risk for chlamydia or gonorrhea. Ask your health care provider if you are at risk.  Talk with your health care provider about whether you are at high  risk of being infected with HIV. Your health care provider may recommend a prescription medicine to help prevent HIV infection.  What else can I do?  Schedule regular health, dental, and eye exams.  Stay current with your vaccines (immunizations).  Do not use any tobacco products, such as cigarettes, chewing tobacco, and e-cigarettes. If you need help quitting, ask your health care provider.  Limit alcohol intake to no more than 2 drinks per day. One drink equals 12 ounces of beer, 5 ounces of wine, or 1 ounces of hard liquor.  Do not use street drugs.  Do not share needles.  Ask your health care provider for help if you need support or information about quitting drugs.  Tell your health care provider if you often feel depressed.  Tell your health care provider if you have ever been abused or do not feel safe at home. This information is not intended to replace advice given to you by your health care provider. Make sure you discuss any questions you have with your health care provider. Document Released: 05/18/2008 Document Revised: 07/19/2016 Document Reviewed: 08/24/2015 Elsevier Interactive Patient Education  [employer].

## 2018-07-02 ENCOUNTER — Other Ambulatory Visit: Payer: Self-pay | Admitting: Family Medicine

## 2018-07-14 ENCOUNTER — Other Ambulatory Visit: Payer: Self-pay | Admitting: Family Medicine

## 2018-07-14 DIAGNOSIS — R972 Elevated prostate specific antigen [PSA]: Secondary | ICD-10-CM

## 2018-07-14 DIAGNOSIS — N529 Male erectile dysfunction, unspecified: Secondary | ICD-10-CM

## 2018-07-14 DIAGNOSIS — D649 Anemia, unspecified: Secondary | ICD-10-CM

## 2018-07-14 DIAGNOSIS — G2581 Restless legs syndrome: Secondary | ICD-10-CM

## 2018-07-14 DIAGNOSIS — E118 Type 2 diabetes mellitus with unspecified complications: Secondary | ICD-10-CM

## 2018-07-14 DIAGNOSIS — M109 Gout, unspecified: Secondary | ICD-10-CM

## 2018-07-14 DIAGNOSIS — E559 Vitamin D deficiency, unspecified: Secondary | ICD-10-CM

## 2018-07-15 DIAGNOSIS — R972 Elevated prostate specific antigen [PSA]: Secondary | ICD-10-CM | POA: Diagnosis not present

## 2018-07-19 ENCOUNTER — Other Ambulatory Visit: Payer: Self-pay | Admitting: Family Medicine

## 2018-07-22 DIAGNOSIS — N401 Enlarged prostate with lower urinary tract symptoms: Secondary | ICD-10-CM | POA: Diagnosis not present

## 2018-07-22 DIAGNOSIS — R972 Elevated prostate specific antigen [PSA]: Secondary | ICD-10-CM | POA: Diagnosis not present

## 2018-07-22 DIAGNOSIS — N138 Other obstructive and reflux uropathy: Secondary | ICD-10-CM | POA: Diagnosis not present

## 2018-08-04 ENCOUNTER — Other Ambulatory Visit: Payer: Self-pay | Admitting: Family Medicine

## 2018-08-06 DIAGNOSIS — H0288B Meibomian gland dysfunction left eye, upper and lower eyelids: Secondary | ICD-10-CM | POA: Diagnosis not present

## 2018-08-06 DIAGNOSIS — H2513 Age-related nuclear cataract, bilateral: Secondary | ICD-10-CM | POA: Diagnosis not present

## 2018-08-06 DIAGNOSIS — H0288A Meibomian gland dysfunction right eye, upper and lower eyelids: Secondary | ICD-10-CM | POA: Diagnosis not present

## 2018-08-06 DIAGNOSIS — H401132 Primary open-angle glaucoma, bilateral, moderate stage: Secondary | ICD-10-CM | POA: Diagnosis not present

## 2018-08-06 DIAGNOSIS — H35033 Hypertensive retinopathy, bilateral: Secondary | ICD-10-CM | POA: Diagnosis not present

## 2018-08-12 ENCOUNTER — Other Ambulatory Visit: Payer: Self-pay | Admitting: Family Medicine

## 2018-08-20 ENCOUNTER — Other Ambulatory Visit: Payer: Self-pay | Admitting: Family Medicine

## 2018-08-20 DIAGNOSIS — G2581 Restless legs syndrome: Secondary | ICD-10-CM

## 2018-08-20 DIAGNOSIS — R972 Elevated prostate specific antigen [PSA]: Secondary | ICD-10-CM

## 2018-08-20 DIAGNOSIS — D649 Anemia, unspecified: Secondary | ICD-10-CM

## 2018-08-20 DIAGNOSIS — N529 Male erectile dysfunction, unspecified: Secondary | ICD-10-CM

## 2018-08-20 DIAGNOSIS — E559 Vitamin D deficiency, unspecified: Secondary | ICD-10-CM

## 2018-08-20 DIAGNOSIS — M109 Gout, unspecified: Secondary | ICD-10-CM

## 2018-08-20 DIAGNOSIS — E118 Type 2 diabetes mellitus with unspecified complications: Secondary | ICD-10-CM

## 2018-09-14 IMAGING — MR MR SHOULDER*L* W/O CM
5 series · 40 of 40 positions shown · non-contrast
Comparison: None.

CLINICAL DATA: Left shoulder pain and weakness for 9 months.

EXAM:
MRI OF THE LEFT SHOULDER WITHOUT CONTRAST
TECHNIQUE: Multiplanar, multisequence MR imaging of the shoulder was performed.
No intravenous contrast was administered.

[Series 5: T2 fat-sat · axial · 4.0mm · 0.44mm/px · z∈[-27,+45]mm · 8 of 16 slices shown]
[im 1/16]
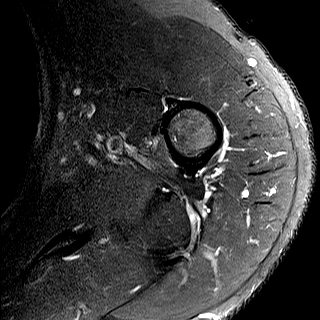
[im 3/16]
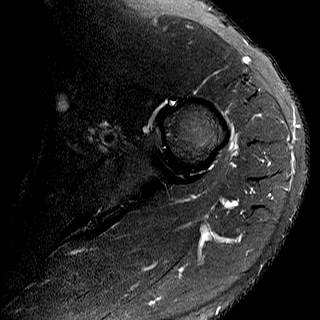
[im 5/16]
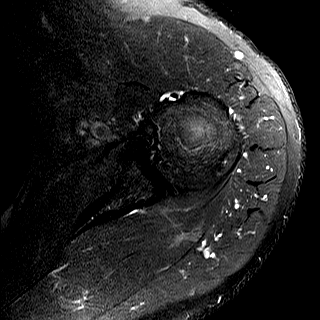
[im 7/16]
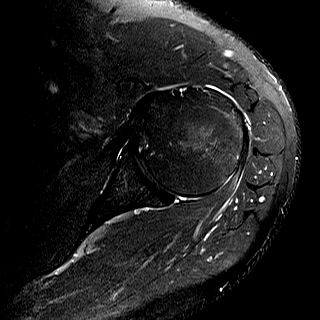
[im 9/16]
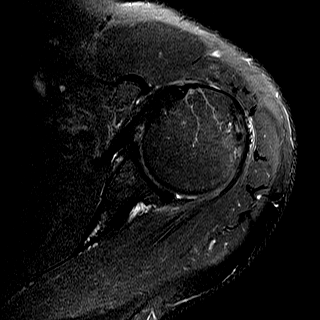
[im 11/16]
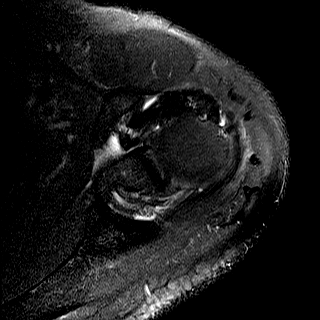
[im 13/16]
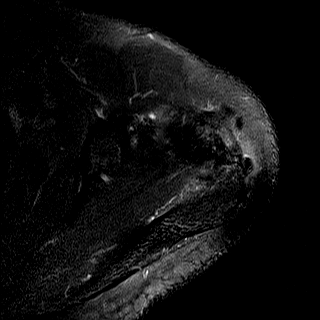
[im 16/16]
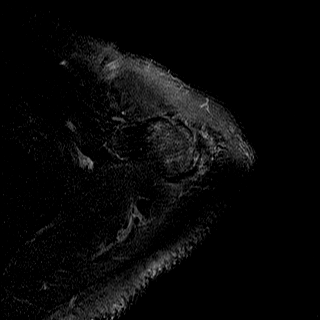

[Series 6: T2 · oblique · 4.0mm · 0.50mm/px · 8 of 16 slices shown (1 of 2)]
[im 1/16]
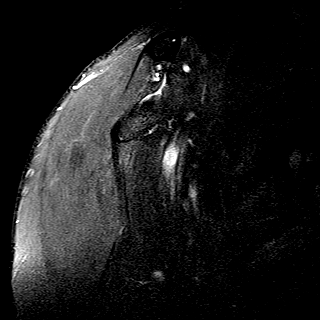
[im 3/16]
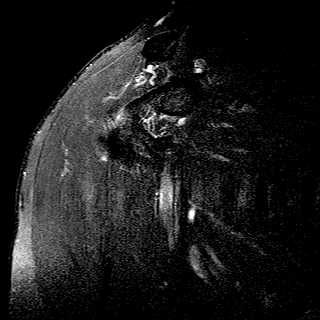
[im 5/16]
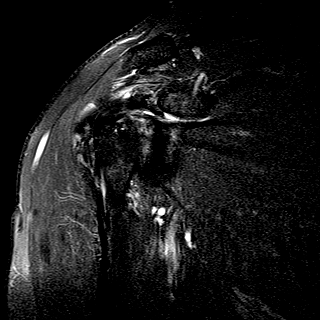
[im 7/16]
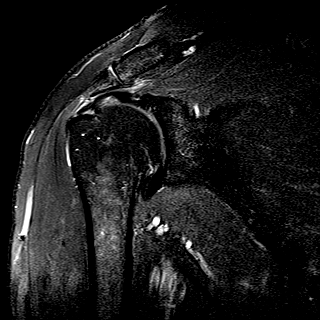
[im 9/16]
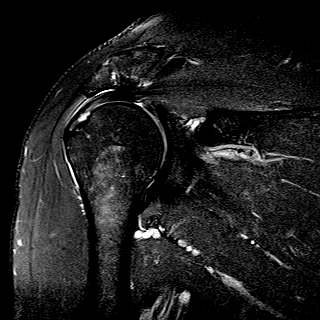
[im 11/16]
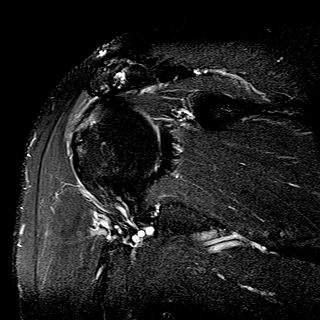
[im 13/16]
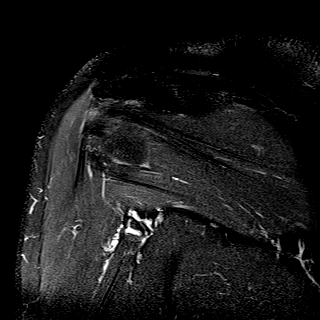
[im 16/16]
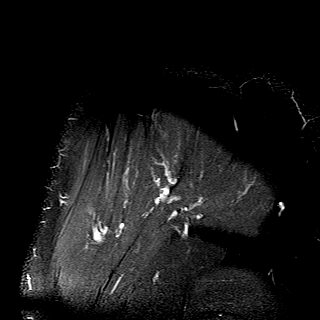

[Series 7: PD · oblique · 4.0mm · 0.50mm/px · 8 of 16 slices shown]
[im 1/16]
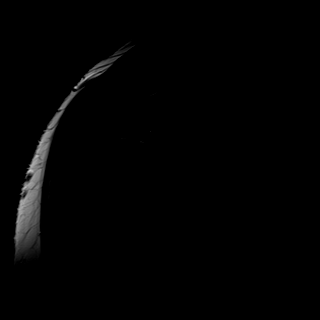
[im 3/16]
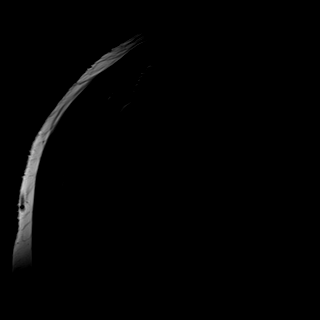
[im 5/16]
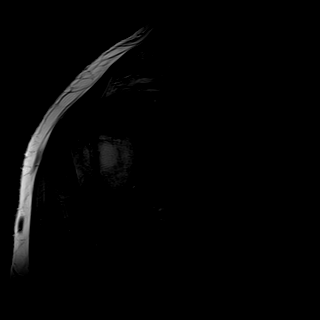
[im 7/16]
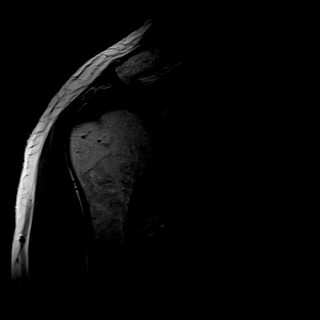
[im 9/16]
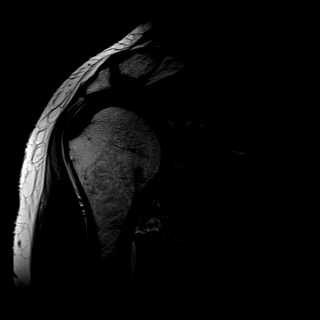
[im 11/16]
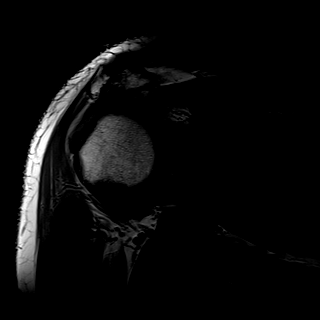
[im 13/16]
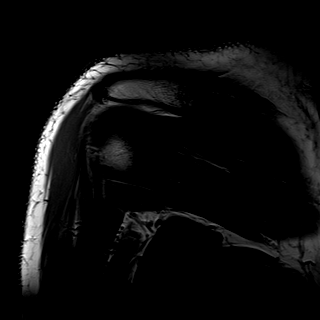
[im 16/16]
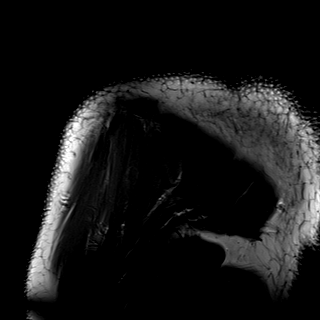

[Series 8: T1 · oblique · 4.0mm · 0.50mm/px · 8 of 16 slices shown]
[im 1/16]
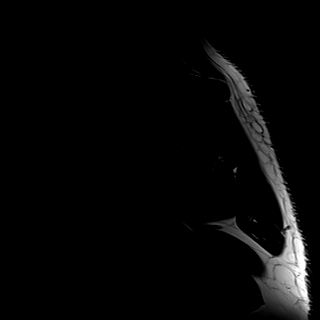
[im 3/16]
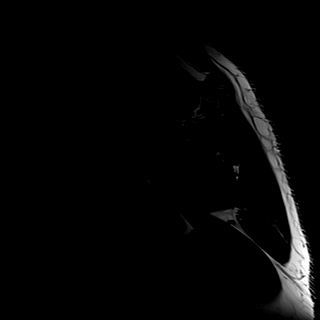
[im 5/16]
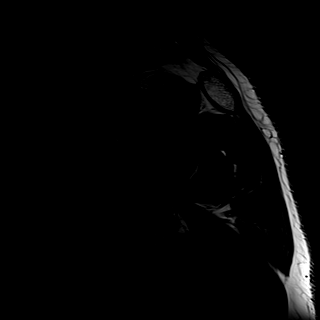
[im 7/16]
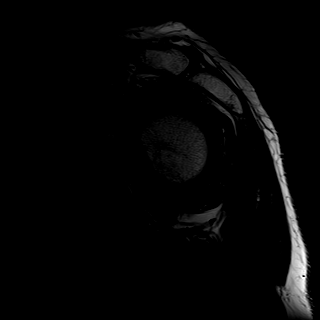
[im 9/16]
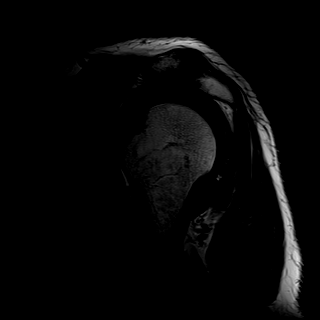
[im 11/16]
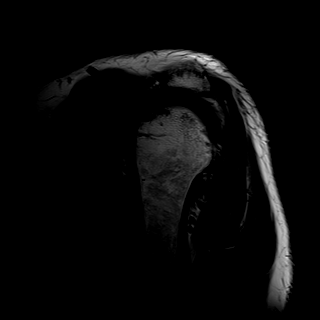
[im 13/16]
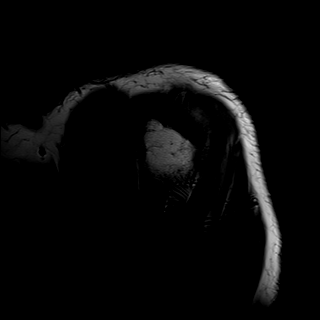
[im 16/16]
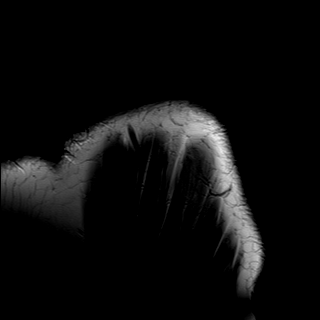

[Series 9: T2 · oblique · 4.0mm · 0.50mm/px · 8 of 16 slices shown (2 of 2)]
[im 1/16]
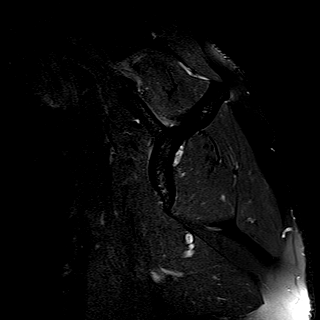
[im 3/16]
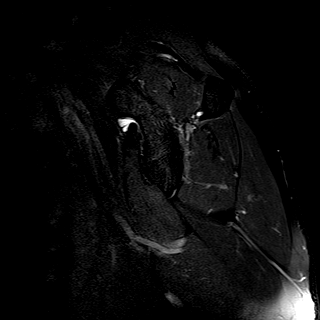
[im 5/16]
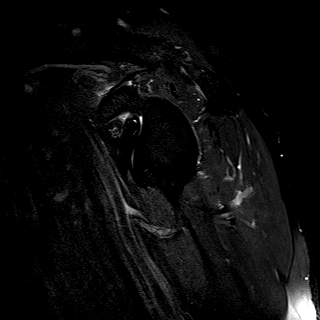
[im 7/16]
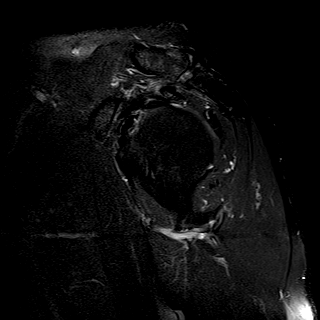
[im 9/16]
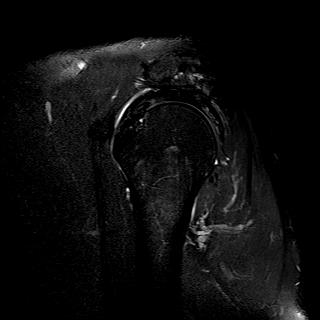
[im 11/16]
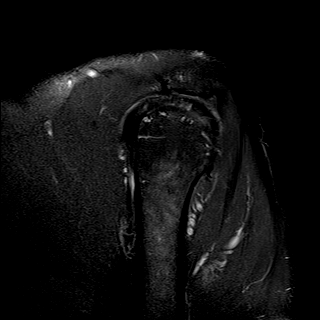
[im 13/16]
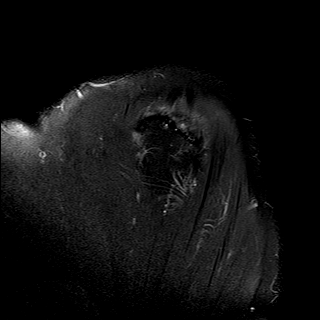
[im 16/16]
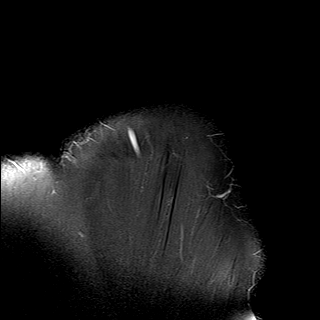

[40 of 40 positions shown; findings below may reference images not displayed]

FINDINGS: Rotator cuff: There is supraspinatus and infraspinatus worse than
subscapularis tendinopathy without tear.

Muscles:  No atrophy or focal lesion.

Biceps long head:  Intact.

Acromioclavicular Joint: Moderate to moderately severe degenerative
change is present. Type 1 acromion. Os acromiale is noted. There is
some fluid in the subacromial/subdeltoid bursa.

Glenohumeral Joint: Negative.

Labrum:  Intact.

Bones:  No fracture or worrisome lesion.

Other: None.
IMPRESSION: Rotator cuff tendinopathy without tear appears worst in the
supraspinatus and infraspinatus.

Moderate to moderately severe acromioclavicular osteoarthritis. Os
acromiale noted.

Small volume of subacromial/subdeltoid fluid compatible with
bursitis.

## 2018-09-23 ENCOUNTER — Other Ambulatory Visit: Payer: Self-pay | Admitting: Family Medicine

## 2018-12-03 ENCOUNTER — Other Ambulatory Visit: Payer: Self-pay | Admitting: Family Medicine

## 2018-12-05 NOTE — Telephone Encounter (Signed)
Yes he needs a follow up appointment some time in the next couple of months.

## 2018-12-05 NOTE — Telephone Encounter (Signed)
Dr Charlett Blake -- refilled lancets and see that pt was last seen by you 03/2018 and he does not have any future appts scheduled. When would you like to see him in the office for follow up?

## 2018-12-06 NOTE — Telephone Encounter (Signed)
appt scheduled for 01/16/19

## 2018-12-06 NOTE — Telephone Encounter (Signed)
Please call pt to schedule appointment as indicated below. Thank you!

## 2018-12-13 ENCOUNTER — Other Ambulatory Visit: Payer: Self-pay | Admitting: Family Medicine

## 2018-12-24 ENCOUNTER — Other Ambulatory Visit: Payer: Self-pay | Admitting: Family Medicine

## 2019-01-07 ENCOUNTER — Other Ambulatory Visit: Payer: Self-pay | Admitting: Family Medicine

## 2019-01-07 DIAGNOSIS — D649 Anemia, unspecified: Secondary | ICD-10-CM

## 2019-01-07 DIAGNOSIS — M109 Gout, unspecified: Secondary | ICD-10-CM

## 2019-01-07 DIAGNOSIS — N529 Male erectile dysfunction, unspecified: Secondary | ICD-10-CM

## 2019-01-07 DIAGNOSIS — R972 Elevated prostate specific antigen [PSA]: Secondary | ICD-10-CM

## 2019-01-07 DIAGNOSIS — E559 Vitamin D deficiency, unspecified: Secondary | ICD-10-CM

## 2019-01-07 DIAGNOSIS — E118 Type 2 diabetes mellitus with unspecified complications: Secondary | ICD-10-CM

## 2019-01-07 DIAGNOSIS — G2581 Restless legs syndrome: Secondary | ICD-10-CM

## 2019-01-16 ENCOUNTER — Ambulatory Visit: Payer: Medicare Other | Admitting: Family Medicine

## 2019-01-27 ENCOUNTER — Ambulatory Visit (INDEPENDENT_AMBULATORY_CARE_PROVIDER_SITE_OTHER): Payer: Medicare Other | Admitting: Family Medicine

## 2019-01-27 DIAGNOSIS — E559 Vitamin D deficiency, unspecified: Secondary | ICD-10-CM | POA: Diagnosis not present

## 2019-01-27 DIAGNOSIS — G8929 Other chronic pain: Secondary | ICD-10-CM

## 2019-01-27 DIAGNOSIS — I10 Essential (primary) hypertension: Secondary | ICD-10-CM

## 2019-01-27 DIAGNOSIS — R350 Frequency of micturition: Secondary | ICD-10-CM

## 2019-01-27 DIAGNOSIS — E7849 Other hyperlipidemia: Secondary | ICD-10-CM

## 2019-01-27 DIAGNOSIS — M25512 Pain in left shoulder: Secondary | ICD-10-CM

## 2019-01-27 DIAGNOSIS — N189 Chronic kidney disease, unspecified: Secondary | ICD-10-CM

## 2019-01-27 DIAGNOSIS — R739 Hyperglycemia, unspecified: Secondary | ICD-10-CM | POA: Diagnosis not present

## 2019-01-27 DIAGNOSIS — M1A0710 Idiopathic chronic gout, right ankle and foot, without tophus (tophi): Secondary | ICD-10-CM

## 2019-01-27 MED ORDER — HYDROCODONE-ACETAMINOPHEN 10-325 MG PO TABS: 1.0000 | ORAL_TABLET | Freq: Two times a day (BID) | ORAL | 0 refills | Status: DC | PRN

## 2019-01-27 NOTE — Assessment & Plan Note (Signed)
Encouraged heart healthy diet, increase exercise, avoid trans fats, consider a krill oil cap daily 

## 2019-01-27 NOTE — Assessment & Plan Note (Signed)
Check UA with culture. 

## 2019-01-27 NOTE — Progress Notes (Signed)
Subjective:    Patient ID: Daniel Gates, male    DOB: 1946-08-12, 73 y.o.   MRN: [id number]  No chief complaint on file.   HPI Patient is in today for follow up. He feels well.no recent febrile illness or hospitalizations. He has been staying active and trying to minimize his carbohydrates. Notes some polyuria and urinary urgency. Denies CP/palp/SOB/HA/congestion/fevers/GI c/o. Taking meds as prescribed  Past Medical History:  Diagnosis Date  . Allergic rhinitis   . Anxiety 2003   "after cancer surgery"  . Asthma    childhood per pt.  . Bradycardia 10/01/2016  . Cancer ([place])    colon; diagnosed 2003  . Cataract 12/29/2016   not sure which eye  . Chronic renal insufficiency 06/07/2017  . Depression   . Diabetes type 2, controlled ([place]) 06/27/2012  . Glaucoma   . Gout   . Hyperlipidemia   . Hypertension   . Medicare annual wellness visit, subsequent 02/15/2014  . Open-angle glaucoma 12/29/2016  . Sickle cell anemia ([place]) 06/12/2017   "they say I have a trace"  . TMJ arthralgia 01/02/2016    Past Surgical History:  Procedure Laterality Date  . COLON SURGERY  2003   for colon cancer  . COLONOSCOPY    . INGUINAL HERNIA REPAIR  1997   right    Family History  Problem Relation Age of Onset  . Diabetes Mother   . Heart disease Mother        MI  . Cancer Father        prostate  . Prostate cancer Father   . Prostate cancer Brother   . Colon cancer Neg Hx   . Stomach cancer Neg Hx   . Esophageal cancer Neg Hx   . Rectal cancer Neg Hx     Social History   Socioeconomic History  . Marital status: Widowed    Spouse name: Not on file  . Number of children: 7  . Years of education: Not on file  . Highest education level: Not on file  Occupational History    Employer: DISABLED  Social Needs  . Financial resource strain: Not on file  . Food insecurity:    Worry: Not on file    Inability: Not on file  . Transportation needs:    Medical: Not on file   Non-medical: Not on file  Tobacco Use  . Smoking status: Never Smoker  . Smokeless tobacco: Never Used  Substance and Sexual Activity  . Alcohol use: No  . Drug use: No  . Sexual activity: Yes  Lifestyle  . Physical activity:    Days per week: Not on file    Minutes per session: Not on file  . Stress: Not on file  Relationships  . Social connections:    Talks on phone: Not on file    Gets together: Not on file    Attends religious service: Not on file    Active member of club or organization: Not on file    Attends meetings of clubs or organizations: Not on file    Relationship status: Not on file  . Intimate partner violence:    Fear of current or ex partner: Not on file    Emotionally abused: Not on file    Physically abused: Not on file    Forced sexual activity: Not on file  Other Topics Concern  . Not on file  Social History Narrative  . Not on file    Outpatient Medications  Prior to Visit  Medication Sig Dispense Refill  . aspirin 325 MG tablet Take 325 mg by mouth daily.     Marland Kitchen atorvastatin (LIPITOR) 20 MG tablet Take 1 tablet (20 mg total) by mouth daily. 30 tablet 5  . COD LIVER OIL PO Take 1 tablet by mouth daily.     Marland Kitchen dicyclomine (BENTYL) 20 MG tablet Take 1 tablet (20 mg total) by mouth 2 (two) times daily. 180 tablet 2  . escitalopram (LEXAPRO) 10 MG tablet TAKE 1 TABLET BY MOUTH DAILY 30 tablet 5  . fenofibrate (TRICOR) 145 MG tablet TAKE 1 TABLET(130 MG) BY MOUTH DAILY BEFORE BREAKFAST 30 tablet 0  . Garlic Oil 1610 MG CAPS Take 1 capsule by mouth daily.     . hydrochlorothiazide (HYDRODIURIL) 25 MG tablet TAKE 1 TABLET(25 MG) BY MOUTH DAILY 90 tablet 1  . Lancets (ONETOUCH DELICA PLUS [id number]) MISC USE TO TEST BLOOD SUGAR TWICE DAILY AS DIRECTED 100 each 1  . latanoprost (XALATAN) 0.005 % ophthalmic solution Place 1 drop into both eyes nightly.    . metFORMIN (GLUCOPHAGE) 500 MG tablet TAKE 1 TABLET(500 MG) BY MOUTH TWICE DAILY WITH A MEAL 180 tablet 0    . metoprolol succinate (TOPROL-XL) 25 MG 24 hr tablet Take 1 tablet (25 mg total) by mouth daily. 90 tablet 2  . Multiple Vitamins-Minerals (ONE-A-DAY MENS HEALTH FORMULA) TABS Take 1 tablet by mouth daily.    . ONE TOUCH ULTRA TEST test strip USE AS DIRECTED TO CHECK BLOOD SUGAR TWICE DAILY 100 each 3  . promethazine (PHENERGAN) 25 MG tablet TAKE ONE (1) TABLET(S) EVERY FOUR (4) HOURS AS NEEDED FOR NAUSEA 60 tablet 0  . rOPINIRole (REQUIP) 1 MG tablet TAKE 1 TO 2 TABLETS BY MOUTH EVERY NIGHT AT BEDTIME 60 tablet 0  . tamsulosin (FLOMAX) 0.4 MG CAPS capsule TAKE 1 CAPSULE(0.4 MG) BY MOUTH DAILY AFTER SUPPER 90 capsule 1  . HYDROcodone-acetaminophen (NORCO) 10-325 MG tablet Take 1 tablet by mouth 2 (two) times daily as needed. 30 tablet 0  . furosemide (LASIX) 40 MG tablet Take 1 tablet (40 mg total) by mouth 2 (two) times daily as needed. Take 1 in the am and 1 in midafternoon for edema and BP as needed (Patient not taking: Reported on 06/27/2018) 60 tablet 11   No facility-administered medications prior to visit.     No Known Allergies  Review of Systems  Constitutional: Negative for fever and malaise/fatigue.  HENT: Negative for congestion.   Eyes: Negative for blurred vision.  Respiratory: Negative for shortness of breath.   Cardiovascular: Negative for chest pain, palpitations and leg swelling.  Gastrointestinal: Negative for abdominal pain, blood in stool and nausea.  Genitourinary: Negative for dysuria and frequency.  Musculoskeletal: Negative for falls.  Skin: Negative for rash.  Neurological: Negative for dizziness, loss of consciousness and headaches.  Endo/Heme/Allergies: Negative for environmental allergies.  Psychiatric/Behavioral: Negative for depression. The patient is not nervous/anxious.        Objective:    Physical Exam Vitals signs and nursing note reviewed.  Constitutional:      General: He is not in acute distress.    Appearance: He is well-developed.  HENT:      Head: Normocephalic and atraumatic.     Nose: Nose normal.  Eyes:     General:        Right eye: No discharge.        Left eye: No discharge.  Neck:     Musculoskeletal: Normal  range of motion and neck supple.  Cardiovascular:     Rate and Rhythm: Normal rate and regular rhythm.     Heart sounds: No murmur.  Pulmonary:     Effort: Pulmonary effort is normal.     Breath sounds: Normal breath sounds.  Abdominal:     General: Bowel sounds are normal.     Palpations: Abdomen is soft.     Tenderness: There is no abdominal tenderness.  Skin:    General: Skin is warm and dry.  Neurological:     Mental Status: He is alert and oriented to person, place, and time.     BP 112/70 (BP Location: Left Arm, Patient Position: Sitting, Cuff Size: Normal)   Pulse (!) 58   Temp 98.1 F (36.7 C) (Oral)   Resp 18   Wt 223 lb 3.2 oz (101.2 kg)   SpO2 96%   BMI 28.66 kg/m  Wt Readings from Last 3 Encounters:  01/27/19 223 lb 3.2 oz (101.2 kg)  06/27/18 218 lb 3.2 oz (99 kg)  02/04/18 214 lb 12.8 oz (97.4 kg)     Lab Results  Component Value Date   WBC 5.8 02/05/2018   HGB 14.2 02/05/2018   HCT 43.5 02/05/2018   PLT 244.0 02/05/2018   GLUCOSE 113 (H) 02/05/2018   CHOL 166 02/05/2018   TRIG 197.0 (H) 02/05/2018   HDL 38.10 (L) 02/05/2018   LDLDIRECT 96.0 10/08/2017   LDLCALC 89 02/05/2018   ALT 23 02/05/2018   AST 24 02/05/2018   NA 136 02/05/2018   K 4.2 02/05/2018   CL 102 02/05/2018   CREATININE 1.84 (H) 02/05/2018   BUN 22 02/05/2018   CO2 26 02/05/2018   TSH 3.20 02/05/2018   PSA 9.10 (H) 02/05/2018   HGBA1C 6.1 02/05/2018   MICROALBUR <0.7 02/05/2018    Lab Results  Component Value Date   TSH 3.20 02/05/2018   Lab Results  Component Value Date   WBC 5.8 02/05/2018   HGB 14.2 02/05/2018   HCT 43.5 02/05/2018   MCV 77.6 (L) 02/05/2018   PLT 244.0 02/05/2018   Lab Results  Component Value Date   NA 136 02/05/2018   K 4.2 02/05/2018   CO2 26 02/05/2018    GLUCOSE 113 (H) 02/05/2018   BUN 22 02/05/2018   CREATININE 1.84 (H) 02/05/2018   BILITOT 0.4 02/05/2018   ALKPHOS 50 02/05/2018   AST 24 02/05/2018   ALT 23 02/05/2018   PROT 7.9 02/05/2018   ALBUMIN 4.0 02/05/2018   CALCIUM 10.4 02/05/2018   GFR 46.60 (L) 02/05/2018   Lab Results  Component Value Date   CHOL 166 02/05/2018   Lab Results  Component Value Date   HDL 38.10 (L) 02/05/2018   Lab Results  Component Value Date   LDLCALC 89 02/05/2018   Lab Results  Component Value Date   TRIG 197.0 (H) 02/05/2018   Lab Results  Component Value Date   CHOLHDL 4 02/05/2018   Lab Results  Component Value Date   HGBA1C 6.1 02/05/2018       Assessment & Plan:   Problem List Items Addressed This Visit    Vitamin D deficiency    Monitor and supplement      Hyperlipidemia    Encouraged heart healthy diet, increase exercise, avoid trans fats, consider a krill oil cap daily      Relevant Orders   Lipid panel   Essential hypertension    Well controlled, no changes to meds.  Encouraged heart healthy diet such as the DASH diet and exercise as tolerated.       Relevant Orders   CBC with Differential/Platelet   Comprehensive metabolic panel   TSH   Urinary frequency    Check UA with culture      Gout    Monitor and hydrate      Left shoulder pain    Persistent left shoulder pain declined shots in past. Does not keep him awake. Has tried to see a sports medicine but was not helpful. He declines orthopaedics appt. For now. Call if worsens      Chronic renal insufficiency    Monitor and hydrate      Relevant Orders   Comprehensive metabolic panel   Hyperglycemia    hgba1c acceptable, minimize simple carbs. Increase exercise as tolerated. Continue current meds      Relevant Orders   Hemoglobin A1c      I have discontinued Breland L. Mendibles's furosemide. I am also having him maintain his aspirin, Garlic Oil, COD LIVER OIL PO, promethazine, ONE-A-DAY MENS  HEALTH FORMULA, atorvastatin, latanoprost, fenofibrate, escitalopram, metFORMIN, metoprolol succinate, dicyclomine, ONETOUCH DELICA PLUS [id number], rOPINIRole, ONE TOUCH ULTRA TEST, tamsulosin, hydrochlorothiazide, and HYDROcodone-acetaminophen.  Meds ordered this encounter  Medications  . HYDROcodone-acetaminophen (NORCO) 10-325 MG tablet    Sig: Take 1 tablet by mouth 2 (two) times daily as needed.    Dispense:  30 tablet    Refill:  0     Penni Homans, MD

## 2019-01-27 NOTE — Assessment & Plan Note (Signed)
Monitor and hydrate 

## 2019-01-27 NOTE — Assessment & Plan Note (Signed)
Well controlled, no changes to meds. Encouraged heart healthy diet such as the DASH diet and exercise as tolerated.  °

## 2019-01-27 NOTE — Assessment & Plan Note (Signed)
hgba1c acceptable, minimize simple carbs. Increase exercise as tolerated. Continue current meds 

## 2019-01-27 NOTE — Patient Instructions (Signed)

## 2019-01-27 NOTE — Assessment & Plan Note (Signed)
Persistent left shoulder pain declined shots in past. Does not keep him awake. Has tried to see a sports medicine but was not helpful. He declines orthopaedics appt. For now. Call if worsens

## 2019-01-27 NOTE — Assessment & Plan Note (Signed)
Monitor and supplement 

## 2019-01-28 ENCOUNTER — Other Ambulatory Visit (INDEPENDENT_AMBULATORY_CARE_PROVIDER_SITE_OTHER): Payer: Medicare Other

## 2019-01-28 ENCOUNTER — Telehealth: Payer: Self-pay

## 2019-01-28 DIAGNOSIS — N189 Chronic kidney disease, unspecified: Secondary | ICD-10-CM

## 2019-01-28 DIAGNOSIS — I10 Essential (primary) hypertension: Secondary | ICD-10-CM

## 2019-01-28 DIAGNOSIS — E7849 Other hyperlipidemia: Secondary | ICD-10-CM | POA: Diagnosis not present

## 2019-01-28 DIAGNOSIS — R739 Hyperglycemia, unspecified: Secondary | ICD-10-CM | POA: Diagnosis not present

## 2019-01-28 DIAGNOSIS — N289 Disorder of kidney and ureter, unspecified: Secondary | ICD-10-CM

## 2019-01-28 DIAGNOSIS — R350 Frequency of micturition: Secondary | ICD-10-CM | POA: Diagnosis not present

## 2019-01-28 LAB — CBC WITH DIFFERENTIAL/PLATELET
Basophils Absolute: 0 10*3/uL (ref 0.0–0.1)
Basophils Relative: 0.8 % (ref 0.0–3.0)
Eosinophils Absolute: 0.1 10*3/uL (ref 0.0–0.7)
Eosinophils Relative: 2.3 % (ref 0.0–5.0)
HCT: 45.9 % (ref 39.0–52.0)
HEMOGLOBIN: 15 g/dL (ref 13.0–17.0)
Lymphocytes Relative: 38.6 % (ref 12.0–46.0)
Lymphs Abs: 2 10*3/uL (ref 0.7–4.0)
MCHC: 32.7 g/dL (ref 30.0–36.0)
MCV: 77.3 fl — ABNORMAL LOW (ref 78.0–100.0)
Monocytes Absolute: 0.5 10*3/uL (ref 0.1–1.0)
Monocytes Relative: 10.5 % (ref 3.0–12.0)
Neutro Abs: 2.4 10*3/uL (ref 1.4–7.7)
Neutrophils Relative %: 47.8 % (ref 43.0–77.0)
Platelets: 195 10*3/uL (ref 150.0–400.0)
RBC: 5.94 Mil/uL — ABNORMAL HIGH (ref 4.22–5.81)
RDW: 17.3 % — ABNORMAL HIGH (ref 11.5–15.5)
WBC: 5.1 10*3/uL (ref 4.0–10.5)

## 2019-01-28 LAB — COMPREHENSIVE METABOLIC PANEL
ALT: 18 U/L (ref 0–53)
AST: 18 U/L (ref 0–37)
Albumin: 4 g/dL (ref 3.5–5.2)
Alkaline Phosphatase: 87 U/L (ref 39–117)
BUN: 24 mg/dL — ABNORMAL HIGH (ref 6–23)
CO2: 29 mEq/L (ref 19–32)
Calcium: 9.7 mg/dL (ref 8.4–10.5)
Chloride: 98 mEq/L (ref 96–112)
Creatinine, Ser: 1.41 mg/dL (ref 0.40–1.50)
GFR: 59.45 mL/min — ABNORMAL LOW (ref >60.00)
Glucose, Bld: 151 mg/dL — ABNORMAL HIGH (ref 70–99)
Potassium: 4.6 mEq/L (ref 3.5–5.1)
Sodium: 136 mEq/L (ref 135–145)
Total Bilirubin: 0.3 mg/dL (ref 0.2–1.2)
Total Protein: 8 g/dL (ref 6.0–8.3)

## 2019-01-28 LAB — LIPID PANEL
Cholesterol: 214 mg/dL — ABNORMAL HIGH (ref 0–200)
HDL: 36.5 mg/dL — ABNORMAL LOW (ref >39.00)
Total CHOL/HDL Ratio: 6
Triglycerides: 499 mg/dL — ABNORMAL HIGH (ref 0.0–149.0)

## 2019-01-28 LAB — LDL CHOLESTEROL, DIRECT: Direct LDL: 93 mg/dL

## 2019-01-28 LAB — HEMOGLOBIN A1C: Hgb A1c MFr Bld: 6.4 % (ref 4.6–6.5)

## 2019-01-28 LAB — TSH: TSH: 4.11 u[IU]/mL (ref 0.35–4.50)

## 2019-01-28 NOTE — Addendum Note (Signed)
Addended by: Kelle Darting A on: 01/28/2019 10:38 AM   Modules accepted: Orders

## 2019-01-28 NOTE — Telephone Encounter (Signed)
PA initiated via Covermymeds; KEY: [id number]. Awaiting determination.

## 2019-01-28 NOTE — Telephone Encounter (Signed)
PA approved.  Request Reference Number: [id number]. HYDROCO/APAP TAB 10-325MG  is approved through 12/04/2019. For further questions, call [phone].

## 2019-01-28 NOTE — Addendum Note (Signed)
Addended by: Magdalene Molly A on: 01/28/2019 10:52 AM   Modules accepted: Orders

## 2019-01-28 NOTE — Addendum Note (Signed)
Addended by: Kelle Darting A on: 01/28/2019 10:59 AM   Modules accepted: Orders

## 2019-01-29 LAB — URINE CULTURE
MICRO NUMBER:: 239606
SPECIMEN QUALITY:: ADEQUATE

## 2019-02-12 DIAGNOSIS — H5203 Hypermetropia, bilateral: Secondary | ICD-10-CM | POA: Diagnosis not present

## 2019-02-12 DIAGNOSIS — H2513 Age-related nuclear cataract, bilateral: Secondary | ICD-10-CM | POA: Diagnosis not present

## 2019-02-12 DIAGNOSIS — H35033 Hypertensive retinopathy, bilateral: Secondary | ICD-10-CM | POA: Diagnosis not present

## 2019-02-12 DIAGNOSIS — H52203 Unspecified astigmatism, bilateral: Secondary | ICD-10-CM | POA: Diagnosis not present

## 2019-02-12 DIAGNOSIS — H524 Presbyopia: Secondary | ICD-10-CM | POA: Diagnosis not present

## 2019-02-12 DIAGNOSIS — Z7984 Long term (current) use of oral hypoglycemic drugs: Secondary | ICD-10-CM | POA: Diagnosis not present

## 2019-02-12 DIAGNOSIS — E119 Type 2 diabetes mellitus without complications: Secondary | ICD-10-CM | POA: Diagnosis not present

## 2019-02-12 DIAGNOSIS — H401132 Primary open-angle glaucoma, bilateral, moderate stage: Secondary | ICD-10-CM | POA: Diagnosis not present

## 2019-02-18 ENCOUNTER — Other Ambulatory Visit: Payer: Self-pay | Admitting: Family Medicine

## 2019-03-13 ENCOUNTER — Other Ambulatory Visit: Payer: Self-pay | Admitting: Family Medicine

## 2019-03-15 ENCOUNTER — Other Ambulatory Visit: Payer: Self-pay | Admitting: Family Medicine

## 2019-05-26 ENCOUNTER — Other Ambulatory Visit: Payer: Self-pay | Admitting: Family Medicine

## 2019-06-26 NOTE — Progress Notes (Signed)
Virtual Visit via Video Note  I connected with patient on 06/30/19 at 10:00 AM EDT by audio enabled telemedicine application and verified that I am speaking with the correct person using two identifiers.   THIS ENCOUNTER IS A VIRTUAL VISIT DUE TO COVID-19 - PATIENT WAS NOT SEEN IN THE OFFICE. PATIENT HAS CONSENTED TO VIRTUAL VISIT / TELEMEDICINE VISIT   Location of patient: home  Location of provider: office  I discussed the limitations of evaluation and management by telemedicine and the availability of in person appointments. The patient expressed understanding and agreed to proceed.   Subjective:   Daniel Gates is a 73 y.o. male who presents for Medicare Annual/Subsequent preventive examination.  Review of Systems: No ROS.  Medicare Wellness Virtual Visit.  Visual/audio telehealth visit, UTA vital signs.   See social history for additional risk factors.   Sleep patterns: Wakes a lot to urinate. Home Safety/Smoke Alarms: Feels safe in home. Smoke alarms in place.  Lives with son in 1 story home.   Male:   CCS-   Due 06/2022  PSA-  Lab Results  Component Value Date   PSA 9.10 (H) 02/05/2018   PSA 7.46 (H) 09/28/2016   PSA 6.69 (H) 06/23/2016       Objective:     Advanced Directives 06/30/2019 06/27/2018 08/07/2017 06/26/2017 06/12/2017 06/23/2016  Does Patient Have a Medical Advance Directive? No No No No No No  Does patient want to make changes to medical advance directive? - Yes (MAU/Ambulatory/Procedural Areas - Information given) - - - -  Would patient like information on creating a medical advance directive? No - Patient declined - No - Patient declined Yes (MAU/Ambulatory/Procedural Areas - Information given) - Yes - Educational materials given    Tobacco Social History   Tobacco Use  Smoking Status Never Smoker  Smokeless Tobacco Never Used     Counseling given: Not Answered   Clinical Intake:  Pain : No/denies pain    Past Medical History:   Diagnosis Date  . Allergic rhinitis   . Anxiety 2003   "after cancer surgery"  . Asthma    childhood per pt.  . Bradycardia 10/01/2016  . Cancer ([place])    colon; diagnosed 2003  . Cataract 12/29/2016   not sure which eye  . Chronic renal insufficiency 06/07/2017  . Depression   . Diabetes type 2, controlled ([place]) 06/27/2012  . Glaucoma   . Gout   . Hyperlipidemia   . Hypertension   . Medicare annual wellness visit, subsequent 02/15/2014  . Open-angle glaucoma 12/29/2016  . Sickle cell anemia ([place]) 06/12/2017   "they say I have a trace"  . TMJ arthralgia 01/02/2016   Past Surgical History:  Procedure Laterality Date  . COLON SURGERY  2003   for colon cancer  . COLONOSCOPY    . INGUINAL HERNIA REPAIR  1997   right   Family History  Problem Relation Age of Onset  . Diabetes Mother   . Heart disease Mother        MI  . Cancer Father        prostate  . Prostate cancer Father   . Prostate cancer Brother   . Colon cancer Neg Hx   . Stomach cancer Neg Hx   . Esophageal cancer Neg Hx   . Rectal cancer Neg Hx    Social History   Socioeconomic History  . Marital status: Widowed    Spouse name: Not on file  . Number of children:  7  . Years of education: Not on file  . Highest education level: Not on file  Occupational History    Employer: DISABLED  Social Needs  . Financial resource strain: Not on file  . Food insecurity    Worry: Not on file    Inability: Not on file  . Transportation needs    Medical: Not on file    Non-medical: Not on file  Tobacco Use  . Smoking status: Never Smoker  . Smokeless tobacco: Never Used  Substance and Sexual Activity  . Alcohol use: No  . Drug use: No  . Sexual activity: Yes  Lifestyle  . Physical activity    Days per week: Not on file    Minutes per session: Not on file  . Stress: Not on file  Relationships  . Social Herbalist on phone: Not on file    Gets together: Not on file    Attends religious service: Not  on file    Active member of club or organization: Not on file    Attends meetings of clubs or organizations: Not on file    Relationship status: Not on file  Other Topics Concern  . Not on file  Social History Narrative  . Not on file    Outpatient Encounter Medications as of 06/30/2019  Medication Sig  . aspirin 325 MG tablet Take 325 mg by mouth daily.   Marland Kitchen atorvastatin (LIPITOR) 20 MG tablet Take 1 tablet (20 mg total) by mouth daily.  . COD LIVER OIL PO Take 1 tablet by mouth daily.   Marland Kitchen dicyclomine (BENTYL) 20 MG tablet Take 1 tablet (20 mg total) by mouth 2 (two) times daily.  Marland Kitchen escitalopram (LEXAPRO) 10 MG tablet TAKE 1 TABLET BY MOUTH DAILY  . fenofibrate (TRICOR) 145 MG tablet TAKE 1 TABLET(130 MG) BY MOUTH DAILY BEFORE BREAKFAST  . Garlic Oil 3953 MG CAPS Take 1 capsule by mouth daily.   . hydrochlorothiazide (HYDRODIURIL) 25 MG tablet TAKE 1 TABLET(25 MG) BY MOUTH DAILY  . HYDROcodone-acetaminophen (NORCO) 10-325 MG tablet Take 1 tablet by mouth 2 (two) times daily as needed.  . Lancets (ONETOUCH DELICA PLUS [id number]) MISC USE TO TEST BLOOD SUGAR TWICE DAILY AS DIRECTED  . latanoprost (XALATAN) 0.005 % ophthalmic solution Place 1 drop into both eyes nightly.  . metFORMIN (GLUCOPHAGE) 500 MG tablet TAKE 1 TABLET(500 MG) BY MOUTH TWICE DAILY WITH A MEAL  . metoprolol succinate (TOPROL-XL) 25 MG 24 hr tablet Take 1 tablet (25 mg total) by mouth daily.  . Multiple Vitamins-Minerals (ONE-A-DAY MENS HEALTH FORMULA) TABS Take 1 tablet by mouth daily.  . ONE TOUCH ULTRA TEST test strip USE AS DIRECTED TO CHECK BLOOD SUGAR TWICE DAILY  . promethazine (PHENERGAN) 25 MG tablet TAKE ONE (1) TABLET(S) EVERY FOUR (4) HOURS AS NEEDED FOR NAUSEA  . rOPINIRole (REQUIP) 1 MG tablet TAKE 1 TO 2 TABLETS BY MOUTH EVERY NIGHT AT BEDTIME  . tamsulosin (FLOMAX) 0.4 MG CAPS capsule TAKE 1 CAPSULE(0.4 MG) BY MOUTH DAILY AFTER SUPPER   No facility-administered encounter medications on file as of  06/30/2019.     Activities of Daily Living No flowsheet data found.  Patient Care Team: [name], MD as PCP - General (Family Medicine) Linward Natal, MD as Consulting Physician (Ophthalmology)   Assessment:   This is a routine wellness examination for Daniel Gates. Physical assessment deferred to PCP.  Exercise Activities and Dietary recommendations   Diet (meal preparation, eat out,  water intake, caffeinated beverages, dairy products, fruits and vegetables): in general, an "unhealthy" diet   Goals    . Decrease soda or juice intake    . Eat more fruits and vegetables    . Increase physical activity     Walk at least 3 times per week        Fall Risk Fall Risk  06/30/2019 06/27/2018 06/26/2017 06/23/2016 12/24/2015  Falls in the past year? 0 No No No No    Depression Screen PHQ 2/9 Scores 06/30/2019 06/27/2018 06/26/2017 06/23/2016  PHQ - 2 Score 0 0 0 2  PHQ- 9 Score - - - 4    Cognitive Function Ad8 score reviewed for issues:  Issues making decisions:no  Less interest in hobbies / activities:no  Repeats questions, stories (family complaining):no  Trouble using ordinary gadgets (microwave, computer, phone):no  Forgets the month or year: no  Mismanaging finances: no  Remembering appts:no  Daily problems with thinking and/or memory:no Ad8 score is=0     MMSE - Mini Mental State Exam 06/27/2018 06/26/2017 06/23/2016  Orientation to time 5 5 5   Orientation to Place 5 5 5   Registration 3 3 3   Attention/ Calculation 5 5 5   Recall 2 2 2   Language- name 2 objects 2 2 2   Language- repeat 1 1 1   Language- follow 3 step command 3 3 3   Language- read & follow direction 1 1 1   Write a sentence 1 1 1   Copy design 1 1 1   Total score 29 29 29         Immunization History  Administered Date(s) Administered  . Td 12/04/1997  . Tdap 01/30/2012   Screening Tests Health Maintenance  Topic Date Due  . PNA vac Low Risk Adult (2 of 2 - PPSV23) 12/23/2016  . FOOT  EXAM  05/18/2017  . OPHTHALMOLOGY EXAM  03/27/2018  . URINE MICROALBUMIN  02/06/2019  . HEMOGLOBIN A1C  07/29/2019  . TETANUS/TDAP  01/29/2022  . COLONOSCOPY  06/12/2022  . Hepatitis C Screening  Completed  . INFLUENZA VACCINE  Discontinued     Plan:   See you next year!  Continue to eat heart healthy diet (full of fruits, vegetables, whole grains, lean protein, water--limit salt, fat, and sugar intake) and increase physical activity as tolerated.  Continue doing brain stimulating activities (puzzles, reading, adult coloring books, staying active) to keep memory sharp.    I have personally reviewed and noted the following in the patient's chart:   . Medical and social history . Use of alcohol, tobacco or illicit drugs  . Current medications and supplements . Functional ability and status . Nutritional status . Physical activity . Advanced directives . List of other physicians . Hospitalizations, surgeries, and ER visits in previous 12 months . Vitals . Screenings to include cognitive, depression, and falls . Referrals and appointments  In addition, I have reviewed and discussed with patient certain preventive protocols, quality metrics, and best practice recommendations. A written personalized care plan for preventive services as well as general preventive health recommendations were provided to patient.     Naaman Plummer [place], [place]  06/30/2019

## 2019-06-30 ENCOUNTER — Other Ambulatory Visit: Payer: Self-pay | Admitting: Family Medicine

## 2019-06-30 ENCOUNTER — Encounter: Payer: Self-pay | Admitting: *Deleted

## 2019-06-30 ENCOUNTER — Ambulatory Visit (INDEPENDENT_AMBULATORY_CARE_PROVIDER_SITE_OTHER): Payer: Medicare Other | Admitting: *Deleted

## 2019-06-30 ENCOUNTER — Other Ambulatory Visit: Payer: Self-pay

## 2019-06-30 DIAGNOSIS — Z0000 Encounter for general adult medical examination without abnormal findings: Secondary | ICD-10-CM

## 2019-06-30 NOTE — Patient Instructions (Signed)
See you next year!  Continue to eat heart healthy diet (full of fruits, vegetables, whole grains, lean protein, water--limit salt, fat, and sugar intake) and increase physical activity as tolerated.  Continue doing brain stimulating activities (puzzles, reading, adult coloring books, staying active) to keep memory sharp.     Daniel Gates , Thank you for taking time to come for your Medicare Wellness Visit. I appreciate your ongoing commitment to your health goals. Please review the following plan we discussed and let me know if I can assist you in the future.   These are the goals we discussed: Goals    . Decrease soda or juice intake    . Eat more fruits and vegetables    . Increase physical activity     Walk at least 3 times per week        This is a list of the screening recommended for you and due dates:  Health Maintenance  Topic Date Due  . Pneumonia vaccines (2 of 2 - PPSV23) 12/23/2016  . Complete foot exam   05/18/2017  . Eye exam for diabetics  03/27/2018  . Urine Protein Check  02/06/2019  . Hemoglobin A1C  07/29/2019  . Tetanus Vaccine  01/29/2022  . Colon Cancer Screening  06/12/2022  .  Hepatitis C: One time screening is recommended by Center for Disease Control  (CDC) for  adults born from [birth year] through 1966.   Completed  . Flu Shot  Discontinued    Health Maintenance After Age 33 After age 35, you are at a higher risk for certain long-term diseases and infections as well as injuries from falls. Falls are a major cause of broken bones and head injuries in people who are older than age 36. Getting regular preventive care can help to keep you healthy and well. Preventive care includes getting regular testing and making lifestyle changes as recommended by your health care provider. Talk with your health care provider about:  Which screenings and tests you should have. A screening is a test that checks for a disease when you have no symptoms.  A diet and exercise  plan that is right for you. What should I know about screenings and tests to prevent falls? Screening and testing are the best ways to find a health problem early. Early diagnosis and treatment give you the best chance of managing medical conditions that are common after age 7. Certain conditions and lifestyle choices may make you more likely to have a fall. Your health care provider may recommend:  Regular vision checks. Poor vision and conditions such as cataracts can make you more likely to have a fall. If you wear glasses, make sure to get your prescription updated if your vision changes.  Medicine review. Work with your health care provider to regularly review all of the medicines you are taking, including over-the-counter medicines. Ask your health care provider about any side effects that may make you more likely to have a fall. Tell your health care provider if any medicines that you take make you feel dizzy or sleepy.  Osteoporosis screening. Osteoporosis is a condition that causes the bones to get weaker. This can make the bones weak and cause them to break more easily.  Blood pressure screening. Blood pressure changes and medicines to control blood pressure can make you feel dizzy.  Strength and balance checks. Your health care provider may recommend certain tests to check your strength and balance while standing, walking, or changing positions.  Foot  health exam. Foot pain and numbness, as well as not wearing proper footwear, can make you more likely to have a fall.  Depression screening. You may be more likely to have a fall if you have a fear of falling, feel emotionally low, or feel unable to do activities that you used to do.  Alcohol use screening. Using too much alcohol can affect your balance and may make you more likely to have a fall. What actions can I take to lower my risk of falls? General instructions  Talk with your health care provider about your risks for falling.  Tell your health care provider if: ? You fall. Be sure to tell your health care provider about all falls, even ones that seem minor. ? You feel dizzy, sleepy, or off-balance.  Take over-the-counter and prescription medicines only as told by your health care provider. These include any supplements.  Eat a healthy diet and maintain a healthy weight. A healthy diet includes low-fat dairy products, low-fat (lean) meats, and fiber from whole grains, beans, and lots of fruits and vegetables. Home safety  Remove any tripping hazards, such as rugs, cords, and clutter.  Install safety equipment such as grab bars in bathrooms and safety rails on stairs.  Keep rooms and walkways well-lit. Activity   Follow a regular exercise program to stay fit. This will help you maintain your balance. Ask your health care provider what types of exercise are appropriate for you.  If you need a cane or walker, use it as recommended by your health care provider.  Wear supportive shoes that have nonskid soles. Lifestyle  Do not drink alcohol if your health care provider tells you not to drink.  If you drink alcohol, limit how much you have: ? 0-1 drink a day for women. ? 0-2 drinks a day for men.  Be aware of how much alcohol is in your drink. In the [place] one drink equals one typical bottle of beer (12 oz), one-half glass of wine (5 oz), or one shot of hard liquor (1 oz).  Do not use any products that contain nicotine or tobacco, such as cigarettes and e-cigarettes. If you need help quitting, ask your health care provider. Summary  Having a healthy lifestyle and getting preventive care can help to protect your health and wellness after age 24.  Screening and testing are the best way to find a health problem early and help you avoid having a fall. Early diagnosis and treatment give you the best chance for managing medical conditions that are more common for people who are older than age 7.  Falls are a  major cause of broken bones and head injuries in people who are older than age 78. Take precautions to prevent a fall at home.  Work with your health care provider to learn what changes you can make to improve your health and wellness and to prevent falls. This information is not intended to replace advice given to you by your health care provider. Make sure you discuss any questions you have with your health care provider. Document Released: 10/03/2017 Document Revised: 03/13/2019 Document Reviewed: 10/03/2017 Elsevier Patient Education  2020 [employer].

## 2019-07-01 DIAGNOSIS — N138 Other obstructive and reflux uropathy: Secondary | ICD-10-CM | POA: Diagnosis not present

## 2019-07-04 ENCOUNTER — Other Ambulatory Visit: Payer: Self-pay | Admitting: Family Medicine

## 2019-07-06 ENCOUNTER — Other Ambulatory Visit: Payer: Self-pay | Admitting: Family Medicine

## 2019-07-14 ENCOUNTER — Ambulatory Visit (INDEPENDENT_AMBULATORY_CARE_PROVIDER_SITE_OTHER): Payer: Medicare Other | Admitting: Family Medicine

## 2019-07-14 ENCOUNTER — Other Ambulatory Visit: Payer: Self-pay

## 2019-07-14 ENCOUNTER — Encounter: Payer: Self-pay | Admitting: Family Medicine

## 2019-07-14 VITALS — BP 140/87 | HR 52 | Temp 97.1°F | Resp 16 | Ht 74.0 in | Wt 216.0 lb

## 2019-07-14 DIAGNOSIS — Z23 Encounter for immunization: Secondary | ICD-10-CM

## 2019-07-14 DIAGNOSIS — S00462A Insect bite (nonvenomous) of left ear, initial encounter: Secondary | ICD-10-CM | POA: Diagnosis not present

## 2019-07-14 DIAGNOSIS — H6012 Cellulitis of left external ear: Secondary | ICD-10-CM

## 2019-07-14 DIAGNOSIS — W57XXXA Bitten or stung by nonvenomous insect and other nonvenomous arthropods, initial encounter: Secondary | ICD-10-CM

## 2019-07-14 MED ORDER — TRIAMCINOLONE ACETONIDE 0.1 % EX CREA: 1.0000 "application " | TOPICAL_CREAM | Freq: Two times a day (BID) | CUTANEOUS | 0 refills | Status: DC

## 2019-07-14 MED ORDER — DOXYCYCLINE HYCLATE 100 MG PO TABS: 100.0000 mg | ORAL_TABLET | Freq: Two times a day (BID) | ORAL | 0 refills | Status: DC

## 2019-07-14 NOTE — Progress Notes (Signed)
Pre visit review using our clinic review tool, if applicable. No additional management support is needed unless otherwise documented below in the visit note. 

## 2019-07-14 NOTE — Progress Notes (Signed)
Patient ID: Daniel Gates, male    DOB: 1946-07-10  Age: 73 y.o. MRN: [id number]    Subjective:  Subjective  HPI Daniel Gates presents for sore in L ear.  He thinks he was bit but something while working in the yard last weekend.  No fevers, no bodyaches.  L ear is swollen and tender.   Review of Systems  Constitutional: Negative for appetite change, diaphoresis, fatigue and unexpected weight change.  HENT: Positive for ear pain.   Eyes: Negative for pain, redness and visual disturbance.  Respiratory: Negative for cough, chest tightness, shortness of breath and wheezing.   Cardiovascular: Negative for chest pain, palpitations and leg swelling.  Endocrine: Negative for cold intolerance, heat intolerance, polydipsia, polyphagia and polyuria.  Genitourinary: Negative for difficulty urinating, dysuria and frequency.  Skin: Positive for color change and wound.  Neurological: Negative for dizziness, light-headedness, numbness and headaches.    History Past Medical History:  Diagnosis Date  . Allergic rhinitis   . Anxiety 2003   "after cancer surgery"  . Asthma    childhood per pt.  . Bradycardia 10/01/2016  . Cancer ([place])    colon; diagnosed 2003  . Cataract 12/29/2016   not sure which eye  . Chronic renal insufficiency 06/07/2017  . Depression   . Diabetes type 2, controlled ([employer]) 06/27/2012  . Glaucoma   . Gout   . Hyperlipidemia   . Hypertension   . Medicare annual wellness visit, subsequent 02/15/2014  . Open-angle glaucoma 12/29/2016  . Sickle cell anemia ([place]) 06/12/2017   "they say I have a trace"  . TMJ arthralgia 01/02/2016    He has a past surgical history that includes Inguinal hernia repair (1997); Colonoscopy; and Colon surgery (2003).   His family history includes Cancer in his father; Diabetes in his mother; Heart disease in his mother; Prostate cancer in his brother and father.He reports that he has never smoked. He has never used smokeless tobacco. He reports  that he does not drink alcohol or use drugs.  Current Outpatient Medications on File Prior to Visit  Medication Sig Dispense Refill  . aspirin 325 MG tablet Take 325 mg by mouth daily.     Marland Kitchen atorvastatin (LIPITOR) 20 MG tablet Take 1 tablet (20 mg total) by mouth daily. 30 tablet 5  . COD LIVER OIL PO Take 1 tablet by mouth daily.     Marland Kitchen dicyclomine (BENTYL) 20 MG tablet Take 1 tablet (20 mg total) by mouth 2 (two) times daily. 180 tablet 2  . escitalopram (LEXAPRO) 10 MG tablet TAKE 1 TABLET BY MOUTH DAILY 30 tablet 5  . fenofibrate (TRICOR) 145 MG tablet TAKE 1 TABLET(130 MG) BY MOUTH DAILY BEFORE BREAKFAST 30 tablet 0  . Garlic Oil 4010 MG CAPS Take 1 capsule by mouth daily.     . hydrochlorothiazide (HYDRODIURIL) 25 MG tablet TAKE 1 TABLET(25 MG) BY MOUTH DAILY 90 tablet 1  . HYDROcodone-acetaminophen (NORCO) 10-325 MG tablet Take 1 tablet by mouth 2 (two) times daily as needed. 30 tablet 0  . Lancets (ONETOUCH DELICA PLUS [id number]) MISC USE TO TEST BLOOD SUGAR TWICE DAILY AS DIRECTED 100 each 1  . latanoprost (XALATAN) 0.005 % ophthalmic solution Place 1 drop into both eyes nightly.    . metFORMIN (GLUCOPHAGE) 500 MG tablet TAKE 1 TABLET(500 MG) BY MOUTH TWICE DAILY WITH A MEAL 180 tablet 0  . metoprolol succinate (TOPROL-XL) 25 MG 24 hr tablet Take 1 tablet (25 mg total) by mouth daily.  90 tablet 2  . Multiple Vitamins-Minerals (ONE-A-DAY MENS HEALTH FORMULA) TABS Take 1 tablet by mouth daily.    Glory Rosebush ULTRA test strip USE AS DIRECTED TO CHECK BLOOD SUGAR TWICE DAILY 100 strip 12  . promethazine (PHENERGAN) 25 MG tablet TAKE ONE (1) TABLET(S) EVERY FOUR (4) HOURS AS NEEDED FOR NAUSEA 60 tablet 0  . rOPINIRole (REQUIP) 1 MG tablet TAKE 1 TO 2 TABLETS BY MOUTH EVERY NIGHT AT BEDTIME 60 tablet 0  . tamsulosin (FLOMAX) 0.4 MG CAPS capsule TAKE 1 CAPSULE(0.4 MG) BY MOUTH DAILY AFTER SUPPER 90 capsule 1   No current facility-administered medications on file prior to visit.       Objective:  Objective  Physical Exam Vitals signs and nursing note reviewed.  Constitutional:      General: He is sleeping.     Appearance: He is well-developed.  HENT:     Head: Normocephalic and atraumatic.     Left Ear: Swelling and tenderness present.     Ears:   Eyes:     Pupils: Pupils are equal, round, and reactive to light.  Neck:     Musculoskeletal: Normal range of motion and neck supple.     Thyroid: No thyromegaly.  Cardiovascular:     Rate and Rhythm: Normal rate and regular rhythm.     Heart sounds: No murmur.  Pulmonary:     Effort: Pulmonary effort is normal. No respiratory distress.     Breath sounds: Normal breath sounds. No wheezing or rales.  Chest:     Chest wall: No tenderness.  Musculoskeletal:        General: No tenderness.  Skin:    General: Skin is warm and dry.  Neurological:     Mental Status: He is oriented to person, place, and time.  Psychiatric:        Behavior: Behavior normal.        Thought Content: Thought content normal.        Judgment: Judgment normal.    BP 140/87 (BP Location: Left Arm, Patient Position: Sitting, Cuff Size: Small)   Pulse (!) 52   Temp (!) 97.1 F (36.2 C) (Temporal)   Resp 16   Ht 6\' 2"  (1.88 m)   Wt 216 lb (98 kg)   SpO2 100%   BMI 27.73 kg/m  Wt Readings from Last 3 Encounters:  07/14/19 216 lb (98 kg)  01/27/19 223 lb 3.2 oz (101.2 kg)  06/27/18 218 lb 3.2 oz (99 kg)     Lab Results  Component Value Date   WBC 5.1 01/28/2019   HGB 15.0 01/28/2019   HCT 45.9 01/28/2019   PLT 195.0 01/28/2019   GLUCOSE 151 (H) 01/28/2019   CHOL 214 (H) 01/28/2019   TRIG (H) 01/28/2019    499.0 Triglyceride is over 400; calculations on Lipids are invalid.   HDL 36.50 (L) 01/28/2019   LDLDIRECT 93.0 01/28/2019   LDLCALC 89 02/05/2018   ALT 18 01/28/2019   AST 18 01/28/2019   NA 136 01/28/2019   K 4.6 01/28/2019   CL 98 01/28/2019   CREATININE 1.41 01/28/2019   BUN 24 (H) 01/28/2019   CO2 29 01/28/2019    TSH 4.11 01/28/2019   PSA 9.10 (H) 02/05/2018   HGBA1C 6.4 01/28/2019   MICROALBUR <0.7 02/05/2018    Mr Shoulder Left W/o Contrast  Result Date: 07/23/2017 CLINICAL DATA:  Left shoulder pain and weakness for 9 months. EXAM: MRI OF THE LEFT SHOULDER WITHOUT CONTRAST TECHNIQUE: Multiplanar,  multisequence MR imaging of the shoulder was performed. No intravenous contrast was administered. COMPARISON:  None. FINDINGS: Rotator cuff: There is supraspinatus and infraspinatus worse than subscapularis tendinopathy without tear. Muscles:  No atrophy or focal lesion. Biceps long head:  Intact. Acromioclavicular Joint: Moderate to moderately severe degenerative change is present. Type 1 acromion. Os acromiale is noted. There is some fluid in the subacromial/subdeltoid bursa. Glenohumeral Joint: Negative. Labrum:  Intact. Bones:  No fracture or worrisome lesion. Other: None. IMPRESSION: Rotator cuff tendinopathy without tear appears worst in the supraspinatus and infraspinatus. Moderate to moderately severe acromioclavicular osteoarthritis. Os acromiale noted. Small volume of subacromial/subdeltoid fluid compatible with bursitis. Electronically Signed   By: Inge Rise M.D.   On: 07/23/2017 08:07     Assessment & Plan:  Plan  I am having Mahad L. Johnnye Sima start on doxycycline and triamcinolone cream. I am also having him maintain his aspirin, Garlic Oil, COD LIVER OIL PO, promethazine, One-A-Day Mens Health Formula, atorvastatin, latanoprost, fenofibrate, metFORMIN, metoprolol succinate, dicyclomine, tamsulosin, HYDROcodone-acetaminophen, escitalopram, rOPINIRole, OneTouch Delica Plus [id number], OneTouch Ultra, and hydrochlorothiazide.  Meds ordered this encounter  Medications  . doxycycline (VIBRA-TABS) 100 MG tablet    Sig: Take 1 tablet (100 mg total) by mouth 2 (two) times daily.    Dispense:  20 tablet    Refill:  0  . triamcinolone cream (KENALOG) 0.1 %    Sig: Apply 1 application topically 2  (two) times daily.    Dispense:  30 g    Refill:  0    Problem List Items Addressed This Visit    None    Visit Diagnoses    Cellulitis of left pinna    -  Primary   Relevant Medications   doxycycline (VIBRA-TABS) 100 MG tablet   triamcinolone cream (KENALOG) 0.1 %   Insect bite of left ear, initial encounter       Relevant Orders   Td vaccine greater than or equal to 7yo preservative free IM (Completed)    rto to see pcp next week or sooner prn   Follow-up: Return in about 1 week (around 07/21/2019) for f/u pcp .  Ann Held, DO

## 2019-07-22 ENCOUNTER — Other Ambulatory Visit: Payer: Self-pay | Admitting: Family Medicine

## 2019-07-24 ENCOUNTER — Other Ambulatory Visit: Payer: Self-pay

## 2019-07-24 ENCOUNTER — Ambulatory Visit (INDEPENDENT_AMBULATORY_CARE_PROVIDER_SITE_OTHER): Payer: Medicare Other | Admitting: Family Medicine

## 2019-07-24 ENCOUNTER — Encounter: Payer: Self-pay | Admitting: Family Medicine

## 2019-07-24 VITALS — BP 122/82 | HR 52 | Temp 97.0°F | Resp 18 | Wt 215.0 lb

## 2019-07-24 DIAGNOSIS — M109 Gout, unspecified: Secondary | ICD-10-CM

## 2019-07-24 DIAGNOSIS — E038 Other specified hypothyroidism: Secondary | ICD-10-CM

## 2019-07-24 DIAGNOSIS — N189 Chronic kidney disease, unspecified: Secondary | ICD-10-CM

## 2019-07-24 DIAGNOSIS — R739 Hyperglycemia, unspecified: Secondary | ICD-10-CM | POA: Diagnosis not present

## 2019-07-24 DIAGNOSIS — I10 Essential (primary) hypertension: Secondary | ICD-10-CM | POA: Diagnosis not present

## 2019-07-24 DIAGNOSIS — E7849 Other hyperlipidemia: Secondary | ICD-10-CM | POA: Diagnosis not present

## 2019-07-24 DIAGNOSIS — E559 Vitamin D deficiency, unspecified: Secondary | ICD-10-CM

## 2019-07-24 DIAGNOSIS — L03818 Cellulitis of other sites: Secondary | ICD-10-CM

## 2019-07-24 DIAGNOSIS — E119 Type 2 diabetes mellitus without complications: Secondary | ICD-10-CM

## 2019-07-24 LAB — LDL CHOLESTEROL, DIRECT: Direct LDL: 86 mg/dL

## 2019-07-24 LAB — LIPID PANEL
Cholesterol: 198 mg/dL (ref 0–200)
HDL: 34.8 mg/dL — ABNORMAL LOW (ref >39.00)
NonHDL: 162.82
Total CHOL/HDL Ratio: 6
Triglycerides: 344 mg/dL — ABNORMAL HIGH (ref 0.0–149.0)
VLDL: 68.8 mg/dL — ABNORMAL HIGH (ref 0.0–40.0)

## 2019-07-24 LAB — COMPREHENSIVE METABOLIC PANEL
ALT: 14 U/L (ref 0–53)
AST: 16 U/L (ref 0–37)
Albumin: 4.1 g/dL (ref 3.5–5.2)
Alkaline Phosphatase: 81 U/L (ref 39–117)
BUN: 22 mg/dL (ref 6–23)
CO2: 26 mEq/L (ref 19–32)
Calcium: 9.8 mg/dL (ref 8.4–10.5)
Chloride: 102 mEq/L (ref 96–112)
Creatinine, Ser: 1.23 mg/dL (ref 0.40–1.50)
GFR: 69.5 mL/min (ref >60.00)
Glucose, Bld: 86 mg/dL (ref 70–99)
Potassium: 4.2 mEq/L (ref 3.5–5.1)
Sodium: 136 mEq/L (ref 135–145)
Total Bilirubin: 0.3 mg/dL (ref 0.2–1.2)
Total Protein: 8 g/dL (ref 6.0–8.3)

## 2019-07-24 LAB — CBC
HCT: 42.2 % (ref 39.0–52.0)
Hemoglobin: 13.7 g/dL (ref 13.0–17.0)
MCHC: 32.4 g/dL (ref 30.0–36.0)
MCV: 78.8 fl (ref 78.0–100.0)
Platelets: 241 10*3/uL (ref 150.0–400.0)
RBC: 5.35 Mil/uL (ref 4.22–5.81)
RDW: 17.1 % — ABNORMAL HIGH (ref 11.5–15.5)
WBC: 7.4 10*3/uL (ref 4.0–10.5)

## 2019-07-24 LAB — HEMOGLOBIN A1C: Hgb A1c MFr Bld: 6.4 % (ref 4.6–6.5)

## 2019-07-24 LAB — TSH: TSH: 3.52 u[IU]/mL (ref 0.35–4.50)

## 2019-07-24 LAB — URIC ACID: Uric Acid, Serum: 8.8 mg/dL — ABNORMAL HIGH (ref 4.0–7.8)

## 2019-07-24 MED ORDER — COLCHICINE 0.6 MG PO TABS: ORAL_TABLET | ORAL | 1 refills | Status: DC

## 2019-07-24 NOTE — Patient Instructions (Signed)
Gout  Gout is painful swelling of your joints. Gout is a type of arthritis. It is caused by having too much uric acid in your body. Uric acid is a chemical that is made when your body breaks down substances called purines. If your body has too much uric acid, sharp crystals can form and build up in your joints. This causes pain and swelling. Gout attacks can happen quickly and be very painful (acute gout). Over time, the attacks can affect more joints and happen more often (chronic gout). What are the causes?  Too much uric acid in your blood. This can happen because: ? Your kidneys do not remove enough uric acid from your blood. ? Your body makes too much uric acid. ? You eat too many foods that are high in purines. These foods include organ meats, some seafood, and beer.  Trauma or stress. What increases the risk?  Having a family history of gout.  Being male and middle-aged.  Being male and having gone through menopause.  Being very overweight (obese).  Drinking alcohol, especially beer.  Not having enough water in the body (being dehydrated).  Losing weight too quickly.  Having an organ transplant.  Having lead poisoning.  Taking certain medicines.  Having kidney disease.  Having a skin condition called psoriasis. What are the signs or symptoms? An attack of acute gout usually happens in just one joint. The most common place is the big toe. Attacks often start at night. Other joints that may be affected include joints of the feet, ankle, knee, fingers, wrist, or elbow. Symptoms of an attack may include:  Very bad pain.  Warmth.  Swelling.  Stiffness.  Shiny, red, or purple skin.  Tenderness. The affected joint may be very painful to touch.  Chills and fever. Chronic gout may cause symptoms more often. More joints may be involved. You may also have white or yellow lumps (tophi) on your hands or feet or in other areas near your joints. How is this  treated?  Treatment for this condition has two phases: treating an acute attack and preventing future attacks.  Acute gout treatment may include: ? NSAIDs. ? Steroids. These are taken by mouth or injected into a joint. ? Colchicine. This medicine relieves pain and swelling. It can be given by mouth or through an IV tube.  Preventive treatment may include: ? Taking small doses of NSAIDs or colchicine daily. ? Using a medicine that reduces uric acid levels in your blood. ? Making changes to your diet. You may need to see a food expert (dietitian) about what to eat and drink to prevent gout. Follow these instructions at home: During a gout attack   If told, put ice on the painful area: ? Put ice in a plastic bag. ? Place a towel between your skin and the bag. ? Leave the ice on for 20 minutes, 2-3 times a day.  Raise (elevate) the painful joint above the level of your heart as often as you can.  Rest the joint as much as possible. If the joint is in your leg, you may be given crutches.  Follow instructions from your doctor about what you cannot eat or drink. Avoiding future gout attacks  Eat a low-purine diet. Avoid foods and drinks such as: ? Liver. ? Kidney. ? Anchovies. ? Asparagus. ? Herring. ? Mushrooms. ? Mussels. ? Beer.  Stay at a healthy weight. If you want to lose weight, talk with your doctor. Do not lose weight   too fast.  Start or continue an exercise plan as told by your doctor. Eating and drinking  Drink enough fluids to keep your pee (urine) pale yellow.  If you drink alcohol: ? Limit how much you use to:  0-1 drink a day for women.  0-2 drinks a day for men. ? Be aware of how much alcohol is in your drink. In the [place] one drink equals one 12 oz bottle of beer (355 mL), one 5 oz glass of wine (148 mL), or one 1 oz glass of hard liquor (44 mL). General instructions  Take over-the-counter and prescription medicines only as told by your doctor.  Do  not drive or use heavy machinery while taking prescription pain medicine.  Return to your normal activities as told by your doctor. Ask your doctor what activities are safe for you.  Keep all follow-up visits as told by your doctor. This is important. Contact a doctor if:  You have another gout attack.  You still have symptoms of a gout attack after 10 days of treatment.  You have problems (side effects) because of your medicines.  You have chills or a fever.  You have burning pain when you pee (urinate).  You have pain in your lower back or belly. Get help right away if:  You have very bad pain.  Your pain cannot be controlled.  You cannot pee. Summary  Gout is painful swelling of the joints.  The most common site of pain is the big toe, but it can affect other joints.  Medicines and avoiding some foods can help to prevent and treat gout attacks. This information is not intended to replace advice given to you by your health care provider. Make sure you discuss any questions you have with your health care provider. Document Released: 08/29/2008 Document Revised: 06/12/2018 Document Reviewed: 06/12/2018 Elsevier Patient Education  2020 [employer]  

## 2019-07-25 ENCOUNTER — Other Ambulatory Visit: Payer: Self-pay | Admitting: *Deleted

## 2019-07-25 MED ORDER — ATORVASTATIN CALCIUM 40 MG PO TABS: 40.0000 mg | ORAL_TABLET | Freq: Every day | ORAL | 3 refills | Status: DC

## 2019-07-25 MED ORDER — ALLOPURINOL 100 MG PO TABS: 100.0000 mg | ORAL_TABLET | Freq: Every day | ORAL | 3 refills | Status: DC

## 2019-07-27 ENCOUNTER — Other Ambulatory Visit: Payer: Self-pay | Admitting: Family Medicine

## 2019-07-27 DIAGNOSIS — L0390 Cellulitis, unspecified: Secondary | ICD-10-CM | POA: Insufficient documentation

## 2019-07-27 DIAGNOSIS — H6012 Cellulitis of left external ear: Secondary | ICD-10-CM

## 2019-07-27 MED ORDER — DOXYCYCLINE HYCLATE 100 MG PO TABS: 100.0000 mg | ORAL_TABLET | Freq: Two times a day (BID) | ORAL | 0 refills | Status: DC

## 2019-07-27 NOTE — Assessment & Plan Note (Signed)
On Levothyroxine, continue to monitor 

## 2019-07-27 NOTE — Progress Notes (Signed)
Subjective:    Patient ID: Daniel Gates, male    DOB: [birth year], 73 y.o.   MRN: [id number]  No chief complaint on file.   HPI Patient is in today for follow up after coming in last week with an infection on  His left ear. He notes a good improvement on Doxycycline but not completely resolved. No fevers or chills. No malaise or signs of systemic illness. His left great toe has been painful for quite some time and he thinks he is having a gouty flare. No injury. Denies CP/palp/SOB/HA/congestion/fevers/GI or GU c/o. Taking meds as prescribed  Past Medical History:  Diagnosis Date  . Allergic rhinitis   . Anxiety 2003   "after cancer surgery"  . Asthma    childhood per pt.  . Bradycardia 10/01/2016  . Cancer ([place])    colon; diagnosed 2003  . Cataract 12/29/2016   not sure which eye  . Chronic renal insufficiency 06/07/2017  . Depression   . Diabetes type 2, controlled ([place]) 06/27/2012  . Glaucoma   . Gout   . Hyperlipidemia   . Hypertension   . Medicare annual wellness visit, subsequent 02/15/2014  . Open-angle glaucoma 12/29/2016  . Sickle cell anemia ([place]) 06/12/2017   "they say I have a trace"  . TMJ arthralgia 01/02/2016    Past Surgical History:  Procedure Laterality Date  . COLON SURGERY  2003   for colon cancer  . COLONOSCOPY    . INGUINAL HERNIA REPAIR  1997   right    Family History  Problem Relation Age of Onset  . Diabetes Mother   . Heart disease Mother        MI  . Cancer Father        prostate  . Prostate cancer Father   . Prostate cancer Brother   . Colon cancer Neg Hx   . Stomach cancer Neg Hx   . Esophageal cancer Neg Hx   . Rectal cancer Neg Hx     Social History   Socioeconomic History  . Marital status: Widowed    Spouse name: Not on file  . Number of children: 7  . Years of education: Not on file  . Highest education level: Not on file  Occupational History    Employer: DISABLED  Social Needs  . Financial resource strain: Not on  file  . Food insecurity    Worry: Not on file    Inability: Not on file  . Transportation needs    Medical: Not on file    Non-medical: Not on file  Tobacco Use  . Smoking status: Never Smoker  . Smokeless tobacco: Never Used  Substance and Sexual Activity  . Alcohol use: No  . Drug use: No  . Sexual activity: Yes  Lifestyle  . Physical activity    Days per week: Not on file    Minutes per session: Not on file  . Stress: Not on file  Relationships  . Social Herbalist on phone: Not on file    Gets together: Not on file    Attends religious service: Not on file    Active member of club or organization: Not on file    Attends meetings of clubs or organizations: Not on file    Relationship status: Not on file  . Intimate partner violence    Fear of current or ex partner: Not on file    Emotionally abused: Not on file    Physically  abused: Not on file    Forced sexual activity: Not on file  Other Topics Concern  . Not on file  Social History Narrative  . Not on file    Outpatient Medications Prior to Visit  Medication Sig Dispense Refill  . aspirin 325 MG tablet Take 325 mg by mouth daily.     . COD LIVER OIL PO Take 1 tablet by mouth daily.     Marland Kitchen dicyclomine (BENTYL) 20 MG tablet TAKE 1 TABLET(20 MG) BY MOUTH TWICE DAILY 180 tablet 2  . escitalopram (LEXAPRO) 10 MG tablet TAKE 1 TABLET BY MOUTH DAILY 30 tablet 5  . fenofibrate (TRICOR) 145 MG tablet TAKE 1 TABLET(130 MG) BY MOUTH DAILY BEFORE BREAKFAST 30 tablet 0  . Garlic Oil 123XX123 MG CAPS Take 1 capsule by mouth daily.     . hydrochlorothiazide (HYDRODIURIL) 25 MG tablet TAKE 1 TABLET(25 MG) BY MOUTH DAILY 90 tablet 1  . HYDROcodone-acetaminophen (NORCO) 10-325 MG tablet Take 1 tablet by mouth 2 (two) times daily as needed. 30 tablet 0  . Lancets (ONETOUCH DELICA PLUS 123XX123) MISC USE TO TEST BLOOD SUGAR TWICE DAILY AS DIRECTED 100 each 1  . latanoprost (XALATAN) 0.005 % ophthalmic solution Place 1 drop  into both eyes nightly.    . metFORMIN (GLUCOPHAGE) 500 MG tablet TAKE 1 TABLET(500 MG) BY MOUTH TWICE DAILY WITH A MEAL 180 tablet 0  . metoprolol succinate (TOPROL-XL) 25 MG 24 hr tablet Take 1 tablet (25 mg total) by mouth daily. 90 tablet 2  . Multiple Vitamins-Minerals (ONE-A-DAY MENS HEALTH FORMULA) TABS Take 1 tablet by mouth daily.    Glory Rosebush ULTRA test strip USE AS DIRECTED TO CHECK BLOOD SUGAR TWICE DAILY 100 strip 12  . promethazine (PHENERGAN) 25 MG tablet TAKE ONE (1) TABLET(S) EVERY FOUR (4) HOURS AS NEEDED FOR NAUSEA 60 tablet 0  . rOPINIRole (REQUIP) 1 MG tablet TAKE 1 TO 2 TABLETS BY MOUTH EVERY NIGHT AT BEDTIME 60 tablet 0  . tamsulosin (FLOMAX) 0.4 MG CAPS capsule TAKE 1 CAPSULE(0.4 MG) BY MOUTH DAILY AFTER SUPPER 90 capsule 1  . triamcinolone cream (KENALOG) 0.1 % Apply 1 application topically 2 (two) times daily. 30 g 0  . atorvastatin (LIPITOR) 20 MG tablet Take 1 tablet (20 mg total) by mouth daily. 30 tablet 5  . doxycycline (VIBRA-TABS) 100 MG tablet Take 1 tablet (100 mg total) by mouth 2 (two) times daily. 20 tablet 0   No facility-administered medications prior to visit.     No Known Allergies  Review of Systems  Constitutional: Negative for fever and malaise/fatigue.  HENT: Negative for congestion.   Eyes: Negative for blurred vision.  Respiratory: Negative for shortness of breath.   Cardiovascular: Negative for chest pain, palpitations and leg swelling.  Gastrointestinal: Negative for abdominal pain, blood in stool and nausea.  Genitourinary: Negative for dysuria and frequency.  Musculoskeletal: Positive for joint pain. Negative for falls.  Skin: Positive for rash.  Neurological: Negative for dizziness, loss of consciousness and headaches.  Endo/Heme/Allergies: Negative for environmental allergies.  Psychiatric/Behavioral: Negative for depression. The patient is not nervous/anxious.        Objective:    Physical Exam Vitals signs and nursing note  reviewed.  Constitutional:      General: He is not in acute distress.    Appearance: He is well-developed.  HENT:     Head: Normocephalic and atraumatic.     Ears:     Comments: Scab on left anterior, upper pinna.  No redness, warmth or discharge    Nose: Nose normal.  Eyes:     General:        Right eye: No discharge.        Left eye: No discharge.  Neck:     Musculoskeletal: Normal range of motion and neck supple.  Cardiovascular:     Rate and Rhythm: Normal rate and regular rhythm.     Heart sounds: No murmur.  Pulmonary:     Effort: Pulmonary effort is normal.     Breath sounds: Normal breath sounds.  Abdominal:     General: Bowel sounds are normal.     Palpations: Abdomen is soft.     Tenderness: There is no abdominal tenderness.  Skin:    General: Skin is warm and dry.  Neurological:     Mental Status: He is alert and oriented to person, place, and time.     BP 122/82 (BP Location: Left Arm, Patient Position: Sitting, Cuff Size: Normal)   Pulse (!) 52   Temp (!) 97 F (36.1 C) (Oral)   Resp 18   Wt 215 lb (97.5 kg)   SpO2 97%   BMI 27.60 kg/m  Wt Readings from Last 3 Encounters:  07/24/19 215 lb (97.5 kg)  07/14/19 216 lb (98 kg)  01/27/19 223 lb 3.2 oz (101.2 kg)    Diabetic Foot Exam - Simple   No data filed     Lab Results  Component Value Date   WBC 7.4 07/24/2019   HGB 13.7 07/24/2019   HCT 42.2 07/24/2019   PLT 241.0 07/24/2019   GLUCOSE 86 07/24/2019   CHOL 198 07/24/2019   TRIG 344.0 (H) 07/24/2019   HDL 34.80 (L) 07/24/2019   LDLDIRECT 86.0 07/24/2019   LDLCALC 89 02/05/2018   ALT 14 07/24/2019   AST 16 07/24/2019   NA 136 07/24/2019   K 4.2 07/24/2019   CL 102 07/24/2019   CREATININE 1.23 07/24/2019   BUN 22 07/24/2019   CO2 26 07/24/2019   TSH 3.52 07/24/2019   PSA 9.10 (H) 02/05/2018   HGBA1C 6.4 07/24/2019   MICROALBUR <0.7 02/05/2018    Lab Results  Component Value Date   TSH 3.52 07/24/2019   Lab Results  Component  Value Date   WBC 7.4 07/24/2019   HGB 13.7 07/24/2019   HCT 42.2 07/24/2019   MCV 78.8 07/24/2019   PLT 241.0 07/24/2019   Lab Results  Component Value Date   NA 136 07/24/2019   K 4.2 07/24/2019   CO2 26 07/24/2019   GLUCOSE 86 07/24/2019   BUN 22 07/24/2019   CREATININE 1.23 07/24/2019   BILITOT 0.3 07/24/2019   ALKPHOS 81 07/24/2019   AST 16 07/24/2019   ALT 14 07/24/2019   PROT 8.0 07/24/2019   ALBUMIN 4.1 07/24/2019   CALCIUM 9.8 07/24/2019   GFR 69.50 07/24/2019   Lab Results  Component Value Date   CHOL 198 07/24/2019   Lab Results  Component Value Date   HDL 34.80 (L) 07/24/2019   Lab Results  Component Value Date   LDLCALC 89 02/05/2018   Lab Results  Component Value Date   TRIG 344.0 (H) 07/24/2019   Lab Results  Component Value Date   CHOLHDL 6 07/24/2019   Lab Results  Component Value Date   HGBA1C 6.4 07/24/2019       Assessment & Plan:   Problem List Items Addressed This Visit    Hypothyroidism    On Levothyroxine, continue  to monitor      Vitamin D deficiency - Primary    Supplement and monitor      Hyperlipidemia    Tolerating statin, encouraged heart healthy diet, avoid trans fats, minimize simple carbs and saturated fats. Increase exercise as tolerated      Relevant Orders   Lipid panel (Completed)   Essential hypertension   Relevant Orders   CBC (Completed)   Comprehensive metabolic panel (Completed)   TSH (Completed)   Gout    Has been acting up in his left big toe for some time now. Uric acid is up. Given a prn course of Colchicine and started on Allopurinol 100 mg po daily      Relevant Orders   Uric acid (Completed)   Non-insulin treated type 2 diabetes mellitus (HCC)    hgba1c acceptable, minimize simple carbs. Increase exercise as tolerated. Continue current meds      Chronic renal insufficiency    Hydrate and monitor      RESOLVED: Hyperglycemia   Relevant Orders   Hemoglobin A1c (Completed)    Cellulitis    Left ear. Has responded well but not completely to one week of Doxycycline. Will proceed with a second week of treatment. If persists will need referral to dermatology         I am having Jersey L. Johnnye Sima start on colchicine. I am also having him maintain his aspirin, Garlic Oil, COD LIVER OIL PO, promethazine, One-A-Day Mens Health Formula, latanoprost, fenofibrate, metFORMIN, metoprolol succinate, tamsulosin, HYDROcodone-acetaminophen, escitalopram, rOPINIRole, OneTouch Delica Plus 0000000, OneTouch Ultra, hydrochlorothiazide, triamcinolone cream, and dicyclomine.  Meds ordered this encounter  Medications  . colchicine 0.6 MG tablet    Sig: 2 tabs po once then 1 tab po q 2 hours prn pain til pain gone, max of 6 tabs in 24 hours or intolerable diarrhea    Dispense:  6 tablet    Refill:  1     Penni Homans, MD

## 2019-07-27 NOTE — Assessment & Plan Note (Signed)
hgba1c acceptable, minimize simple carbs. Increase exercise as tolerated. Continue current meds 

## 2019-07-27 NOTE — Assessment & Plan Note (Signed)
Hydrate and monitor 

## 2019-07-27 NOTE — Assessment & Plan Note (Signed)
Tolerating statin, encouraged heart healthy diet, avoid trans fats, minimize simple carbs and saturated fats. Increase exercise as tolerated 

## 2019-07-27 NOTE — Assessment & Plan Note (Signed)
Left ear. Has responded well but not completely to one week of Doxycycline. Will proceed with a second week of treatment. If persists will need referral to dermatology

## 2019-07-27 NOTE — Assessment & Plan Note (Signed)
Supplement and monitor 

## 2019-07-27 NOTE — Assessment & Plan Note (Signed)
Has been acting up in his left big toe for some time now. Uric acid is up. Given a prn course of Colchicine and started on Allopurinol 100 mg po daily

## 2019-08-01 ENCOUNTER — Encounter: Payer: Medicare Other | Admitting: Family Medicine

## 2019-08-07 ENCOUNTER — Other Ambulatory Visit: Payer: Self-pay | Admitting: Family Medicine

## 2019-08-15 DIAGNOSIS — H401132 Primary open-angle glaucoma, bilateral, moderate stage: Secondary | ICD-10-CM | POA: Diagnosis not present

## 2019-09-05 ENCOUNTER — Telehealth: Payer: Self-pay

## 2019-09-05 NOTE — Telephone Encounter (Signed)
If he is willing we can refer to vascular surgeon for further testing of peripheral vascular disease

## 2019-09-05 NOTE — Telephone Encounter (Signed)
Left detailed message about the referral

## 2019-09-05 NOTE — Telephone Encounter (Signed)
RN- Starling Manns, [facility]  home health [phone]. . Right leg showed moderate reading for PAD.  Please advise

## 2019-09-11 ENCOUNTER — Encounter: Payer: Self-pay | Admitting: Family Medicine

## 2019-09-11 ENCOUNTER — Ambulatory Visit (INDEPENDENT_AMBULATORY_CARE_PROVIDER_SITE_OTHER): Payer: Medicare Other | Admitting: Family Medicine

## 2019-09-11 ENCOUNTER — Other Ambulatory Visit: Payer: Self-pay

## 2019-09-11 VITALS — BP 132/78 | HR 59 | Temp 97.1°F | Resp 18 | Wt 216.6 lb

## 2019-09-11 DIAGNOSIS — I739 Peripheral vascular disease, unspecified: Secondary | ICD-10-CM | POA: Diagnosis not present

## 2019-09-11 DIAGNOSIS — M1A0710 Idiopathic chronic gout, right ankle and foot, without tophus (tophi): Secondary | ICD-10-CM

## 2019-09-11 DIAGNOSIS — E119 Type 2 diabetes mellitus without complications: Secondary | ICD-10-CM | POA: Diagnosis not present

## 2019-09-11 DIAGNOSIS — Z0000 Encounter for general adult medical examination without abnormal findings: Secondary | ICD-10-CM

## 2019-09-11 DIAGNOSIS — E559 Vitamin D deficiency, unspecified: Secondary | ICD-10-CM

## 2019-09-11 DIAGNOSIS — E7849 Other hyperlipidemia: Secondary | ICD-10-CM

## 2019-09-11 DIAGNOSIS — I10 Essential (primary) hypertension: Secondary | ICD-10-CM

## 2019-09-11 LAB — URIC ACID: Uric Acid, Serum: 6.1 mg/dL (ref 4.0–7.8)

## 2019-09-11 NOTE — Patient Instructions (Signed)
Omron blood pressure cuff upper arm Pulse oximeter, want numbers in 90s   Weekly vitals   Multivitamin with minerals, selenium zinc, vitamin c and d   Preventive Care 32 Years and Older, Male Preventive care refers to lifestyle choices and visits with your health care provider that can promote health and wellness. This includes:  A yearly physical exam. This is also called an annual well check.  Regular dental and eye exams.  Immunizations.  Screening for certain conditions.  Healthy lifestyle choices, such as diet and exercise. What can I expect for my preventive care visit? Physical exam Your health care provider will check:  Height and weight. These may be used to calculate body mass index (BMI), which is a measurement that tells if you are at a healthy weight.  Heart rate and blood pressure.  Your skin for abnormal spots. Counseling Your health care provider may ask you questions about:  Alcohol, tobacco, and drug use.  Emotional well-being.  Home and relationship well-being.  Sexual activity.  Eating habits.  History of falls.  Memory and ability to understand (cognition).  Work and work Statistician. What immunizations do I need?  Influenza (flu) vaccine  This is recommended every year. Tetanus, diphtheria, and pertussis (Tdap) vaccine  You may need a Td booster every 10 years. Varicella (chickenpox) vaccine  You may need this vaccine if you have not already been vaccinated. Zoster (shingles) vaccine  You may need this after age 64. Pneumococcal conjugate (PCV13) vaccine  One dose is recommended after age 49. Pneumococcal polysaccharide (PPSV23) vaccine  One dose is recommended after age 73. Measles, mumps, and rubella (MMR) vaccine  You may need at least one dose of MMR if you were born in 1957 or later. You may also need a second dose. Meningococcal conjugate (MenACWY) vaccine  You may need this if you have certain conditions. Hepatitis  A vaccine  You may need this if you have certain conditions or if you travel or work in places where you may be exposed to hepatitis A. Hepatitis B vaccine  You may need this if you have certain conditions or if you travel or work in places where you may be exposed to hepatitis B. Haemophilus influenzae type b (Hib) vaccine  You may need this if you have certain conditions. You may receive vaccines as individual doses or as more than one vaccine together in one shot (combination vaccines). Talk with your health care provider about the risks and benefits of combination vaccines. What tests do I need? Blood tests  Lipid and cholesterol levels. These may be checked every 5 years, or more frequently depending on your overall health.  Hepatitis C test.  Hepatitis B test. Screening  Lung cancer screening. You may have this screening every year starting at age 45 if you have a 30-pack-year history of smoking and currently smoke or have quit within the past 15 years.  Colorectal cancer screening. All adults should have this screening starting at age 76 and continuing until age 45. Your health care provider may recommend screening at age 70 if you are at increased risk. You will have tests every 1-10 years, depending on your results and the type of screening test.  Prostate cancer screening. Recommendations will vary depending on your family history and other risks.  Diabetes screening. This is done by checking your blood sugar (glucose) after you have not eaten for a while (fasting). You may have this done every 1-3 years.  Abdominal aortic aneurysm (AAA)  screening. You may need this if you are a current or former smoker.  Sexually transmitted disease (STD) testing. Follow these instructions at home: Eating and drinking  Eat a diet that includes fresh fruits and vegetables, whole grains, lean protein, and low-fat dairy products. Limit your intake of foods with high amounts of sugar,  saturated fats, and salt.  Take vitamin and mineral supplements as recommended by your health care provider.  Do not drink alcohol if your health care provider tells you not to drink.  If you drink alcohol: ? Limit how much you have to 0-2 drinks a day. ? Be aware of how much alcohol is in your drink. In the [place] one drink equals one 12 oz bottle of beer (355 mL), one 5 oz glass of wine (148 mL), or one 1 oz glass of hard liquor (44 mL). Lifestyle  Take daily care of your teeth and gums.  Stay active. Exercise for at least 30 minutes on 5 or more days each week.  Do not use any products that contain nicotine or tobacco, such as cigarettes, e-cigarettes, and chewing tobacco. If you need help quitting, ask your health care provider.  If you are sexually active, practice safe sex. Use a condom or other form of protection to prevent STIs (sexually transmitted infections).  Talk with your health care provider about taking a low-dose aspirin or statin. What's next?  Visit your health care provider once a year for a well check visit.  Ask your health care provider how often you should have your eyes and teeth checked.  Stay up to date on all vaccines. This information is not intended to replace advice given to you by your health care provider. Make sure you discuss any questions you have with your health care provider. Document Released: 12/17/2015 Document Revised: 11/14/2018 Document Reviewed: 11/14/2018 Elsevier Patient Education  2020 [employer].

## 2019-09-11 NOTE — Assessment & Plan Note (Signed)
hgba1c acceptable, minimize simple carbs. Increase exercise as tolerated. Continue current meds 

## 2019-09-11 NOTE — Assessment & Plan Note (Addendum)
Patient encouraged to maintain heart healthy diet, regular exercise, adequate sleep. Consider daily probiotics. Take medications as prescribed. Labs reviewed 

## 2019-09-11 NOTE — Assessment & Plan Note (Signed)
Supplement and monitor 

## 2019-09-12 ENCOUNTER — Other Ambulatory Visit: Payer: Self-pay | Admitting: Family Medicine

## 2019-09-14 DIAGNOSIS — I739 Peripheral vascular disease, unspecified: Secondary | ICD-10-CM | POA: Insufficient documentation

## 2019-09-14 NOTE — Assessment & Plan Note (Signed)
Identified on home ABI screen in Left leg. Due to patients numerous risk factors he is referred to vascular surgery for further work up.

## 2019-09-14 NOTE — Assessment & Plan Note (Signed)
Well controlled, no changes to meds. Encouraged heart healthy diet such as the DASH diet and exercise as tolerated.  °

## 2019-09-14 NOTE — Assessment & Plan Note (Signed)
Uric acid much better, still mild pain in left great toe. Continue Allopurinol at 100 mg daily and hydrate well. Avoid offending foods.

## 2019-09-14 NOTE — Assessment & Plan Note (Signed)
Tolerating statin, encouraged heart healthy diet, avoid trans fats, minimize simple carbs and saturated fats. Increase exercise as tolerated 

## 2019-09-14 NOTE — Progress Notes (Signed)
.   Subjective:    Patient ID: Daniel Gates, male    DOB: 19-May-1946, 73 y.o.   MRN: [id number]  No chief complaint on file.   HPI Patient is in today for annual preventative exam and follow up on chronic medical concerns including gout, diabetes, hypertension and more. His gout in his left great toe is much better but the pain is not completely resolved. He never took the Colchicine but he feels the Allopurinol has been helpful. He has been hydrating well. No recent febrile illness or hospitalizations. He does note he has been maintaining quarantine well. Trying to maintain a heart healthy diet, stays active. No polyuria or polydipsia. Denies CP/palp/SOB/HA/congestion/fevers/GI or GU c/o. Taking meds as prescribed  Past Medical History:  Diagnosis Date  . Allergic rhinitis   . Anxiety 2003   "after cancer surgery"  . Asthma    childhood per pt.  . Bradycardia 10/01/2016  . Cancer ([place])    colon; diagnosed 2003  . Cataract 12/29/2016   not sure which eye  . Chronic renal insufficiency 06/07/2017  . Depression   . Diabetes type 2, controlled ([place]) 06/27/2012  . Glaucoma   . Gout   . Hyperlipidemia   . Hypertension   . Medicare annual wellness visit, subsequent 02/15/2014  . Open-angle glaucoma 12/29/2016  . Sickle cell anemia ([place]) 06/12/2017   "they say I have a trace"  . TMJ arthralgia 01/02/2016    Past Surgical History:  Procedure Laterality Date  . COLON SURGERY  2003   for colon cancer  . COLONOSCOPY    . INGUINAL HERNIA REPAIR  1997   right    Family History  Problem Relation Age of Onset  . Diabetes Mother   . Heart disease Mother        MI  . Cancer Father        prostate  . Prostate cancer Father   . Prostate cancer Brother   . Colon cancer Neg Hx   . Stomach cancer Neg Hx   . Esophageal cancer Neg Hx   . Rectal cancer Neg Hx     Social History   Socioeconomic History  . Marital status: Widowed    Spouse name: Not on file  . Number of children: 7   . Years of education: Not on file  . Highest education level: Not on file  Occupational History    Employer: DISABLED  Social Needs  . Financial resource strain: Not on file  . Food insecurity    Worry: Not on file    Inability: Not on file  . Transportation needs    Medical: Not on file    Non-medical: Not on file  Tobacco Use  . Smoking status: Never Smoker  . Smokeless tobacco: Never Used  Substance and Sexual Activity  . Alcohol use: No  . Drug use: No  . Sexual activity: Yes  Lifestyle  . Physical activity    Days per week: Not on file    Minutes per session: Not on file  . Stress: Not on file  Relationships  . Social Herbalist on phone: Not on file    Gets together: Not on file    Attends religious service: Not on file    Active member of club or organization: Not on file    Attends meetings of clubs or organizations: Not on file    Relationship status: Not on file  . Intimate partner violence  Fear of current or ex partner: Not on file    Emotionally abused: Not on file    Physically abused: Not on file    Forced sexual activity: Not on file  Other Topics Concern  . Not on file  Social History Narrative  . Not on file    Outpatient Medications Prior to Visit  Medication Sig Dispense Refill  . allopurinol (ZYLOPRIM) 100 MG tablet Take 1 tablet (100 mg total) by mouth daily. 30 tablet 3  . aspirin 325 MG tablet Take 325 mg by mouth daily.     Marland Kitchen atorvastatin (LIPITOR) 40 MG tablet Take 1 tablet (40 mg total) by mouth at bedtime. 30 tablet 3  . COD LIVER OIL PO Take 1 tablet by mouth daily.     . colchicine 0.6 MG tablet 2 tabs po once then 1 tab po q 2 hours prn pain til pain gone, max of 6 tabs in 24 hours or intolerable diarrhea 6 tablet 1  . dicyclomine (BENTYL) 20 MG tablet TAKE 1 TABLET(20 MG) BY MOUTH TWICE DAILY 180 tablet 2  . fenofibrate (TRICOR) 145 MG tablet TAKE 1 TABLET(130 MG) BY MOUTH DAILY BEFORE BREAKFAST 30 tablet 0  . Garlic  Oil 123XX123 MG CAPS Take 1 capsule by mouth daily.     . hydrochlorothiazide (HYDRODIURIL) 25 MG tablet TAKE 1 TABLET(25 MG) BY MOUTH DAILY 90 tablet 1  . HYDROcodone-acetaminophen (NORCO) 10-325 MG tablet Take 1 tablet by mouth 2 (two) times daily as needed. 30 tablet 0  . Lancets (ONETOUCH DELICA PLUS 123XX123) MISC USE TO TEST BLOOD SUGAR TWICE DAILY AS DIRECTED 100 each 1  . latanoprost (XALATAN) 0.005 % ophthalmic solution Place 1 drop into both eyes nightly.    . metFORMIN (GLUCOPHAGE) 500 MG tablet TAKE 1 TABLET(500 MG) BY MOUTH TWICE DAILY WITH A MEAL 180 tablet 0  . metoprolol succinate (TOPROL-XL) 25 MG 24 hr tablet TAKE 1 TABLET(25 MG) BY MOUTH DAILY 90 tablet 2  . Multiple Vitamins-Minerals (ONE-A-DAY MENS HEALTH FORMULA) TABS Take 1 tablet by mouth daily.    Glory Rosebush ULTRA test strip USE AS DIRECTED TO CHECK BLOOD SUGAR TWICE DAILY 100 strip 12  . promethazine (PHENERGAN) 25 MG tablet TAKE ONE (1) TABLET(S) EVERY FOUR (4) HOURS AS NEEDED FOR NAUSEA 60 tablet 0  . rOPINIRole (REQUIP) 1 MG tablet TAKE 1 TO 2 TABLETS BY MOUTH EVERY NIGHT AT BEDTIME 60 tablet 0  . tamsulosin (FLOMAX) 0.4 MG CAPS capsule TAKE 1 CAPSULE(0.4 MG) BY MOUTH DAILY AFTER SUPPER 90 capsule 1  . triamcinolone cream (KENALOG) 0.1 % Apply 1 application topically 2 (two) times daily. 30 g 0  . doxycycline (VIBRA-TABS) 100 MG tablet Take 1 tablet (100 mg total) by mouth 2 (two) times daily. 20 tablet 0  . escitalopram (LEXAPRO) 10 MG tablet TAKE 1 TABLET BY MOUTH DAILY 30 tablet 5   No facility-administered medications prior to visit.     No Known Allergies  Review of Systems  Constitutional: Positive for malaise/fatigue. Negative for chills and fever.  HENT: Negative for congestion and hearing loss.   Eyes: Negative for discharge.  Respiratory: Negative for cough, sputum production and shortness of breath.   Cardiovascular: Negative for chest pain, palpitations and leg swelling.  Gastrointestinal: Negative for  abdominal pain, blood in stool, constipation, diarrhea, heartburn, nausea and vomiting.  Genitourinary: Negative for dysuria, frequency, hematuria and urgency.  Musculoskeletal: Positive for joint pain. Negative for back pain, falls and myalgias.  Skin: Negative for  rash.  Neurological: Negative for dizziness, sensory change, loss of consciousness, weakness and headaches.  Endo/Heme/Allergies: Negative for environmental allergies. Does not bruise/bleed easily.  Psychiatric/Behavioral: Negative for depression and suicidal ideas. The patient is not nervous/anxious and does not have insomnia.        Objective:    Physical Exam Vitals signs and nursing note reviewed.  Constitutional:      General: He is not in acute distress.    Appearance: He is well-developed.  HENT:     Head: Normocephalic and atraumatic.     Right Ear: Tympanic membrane and external ear normal.     Left Ear: Tympanic membrane and external ear normal.     Nose: Nose normal.  Eyes:     General:        Right eye: No discharge.        Left eye: No discharge.  Neck:     Musculoskeletal: Normal range of motion and neck supple.  Cardiovascular:     Rate and Rhythm: Normal rate and regular rhythm.     Heart sounds: No murmur.  Pulmonary:     Effort: Pulmonary effort is normal.     Breath sounds: Normal breath sounds. No wheezing.  Abdominal:     General: Bowel sounds are normal.     Palpations: Abdomen is soft. There is no mass.     Tenderness: There is no abdominal tenderness.  Musculoskeletal:     Right lower leg: No edema.     Left lower leg: No edema.  Skin:    General: Skin is warm and dry.  Neurological:     Mental Status: He is alert and oriented to person, place, and time.     Cranial Nerves: No cranial nerve deficit.     Deep Tendon Reflexes: Reflexes normal.  Psychiatric:        Mood and Affect: Mood normal.        Behavior: Behavior normal.     BP 132/78 (BP Location: Left Arm, Patient  Position: Sitting, Cuff Size: Normal)   Pulse (!) 59   Temp (!) 97.1 F (36.2 C) (Temporal)   Resp 18   Wt 216 lb 9.6 oz (98.2 kg)   SpO2 97%   BMI 27.81 kg/m  Wt Readings from Last 3 Encounters:  09/11/19 216 lb 9.6 oz (98.2 kg)  07/24/19 215 lb (97.5 kg)  07/14/19 216 lb (98 kg)    Diabetic Foot Exam - Simple   No data filed     Lab Results  Component Value Date   WBC 7.4 07/24/2019   HGB 13.7 07/24/2019   HCT 42.2 07/24/2019   PLT 241.0 07/24/2019   GLUCOSE 86 07/24/2019   CHOL 198 07/24/2019   TRIG 344.0 (H) 07/24/2019   HDL 34.80 (L) 07/24/2019   LDLDIRECT 86.0 07/24/2019   LDLCALC 89 02/05/2018   ALT 14 07/24/2019   AST 16 07/24/2019   NA 136 07/24/2019   K 4.2 07/24/2019   CL 102 07/24/2019   CREATININE 1.23 07/24/2019   BUN 22 07/24/2019   CO2 26 07/24/2019   TSH 3.52 07/24/2019   PSA 9.10 (H) 02/05/2018   HGBA1C 6.4 07/24/2019   MICROALBUR <0.7 02/05/2018    Lab Results  Component Value Date   TSH 3.52 07/24/2019   Lab Results  Component Value Date   WBC 7.4 07/24/2019   HGB 13.7 07/24/2019   HCT 42.2 07/24/2019   MCV 78.8 07/24/2019   PLT 241.0 07/24/2019  Lab Results  Component Value Date   NA 136 07/24/2019   K 4.2 07/24/2019   CO2 26 07/24/2019   GLUCOSE 86 07/24/2019   BUN 22 07/24/2019   CREATININE 1.23 07/24/2019   BILITOT 0.3 07/24/2019   ALKPHOS 81 07/24/2019   AST 16 07/24/2019   ALT 14 07/24/2019   PROT 8.0 07/24/2019   ALBUMIN 4.1 07/24/2019   CALCIUM 9.8 07/24/2019   GFR 69.50 07/24/2019   Lab Results  Component Value Date   CHOL 198 07/24/2019   Lab Results  Component Value Date   HDL 34.80 (L) 07/24/2019   Lab Results  Component Value Date   LDLCALC 89 02/05/2018   Lab Results  Component Value Date   TRIG 344.0 (H) 07/24/2019   Lab Results  Component Value Date   CHOLHDL 6 07/24/2019   Lab Results  Component Value Date   HGBA1C 6.4 07/24/2019       Assessment & Plan:   Problem List Items  Addressed This Visit    Vitamin D deficiency    Supplement and monitor       Hyperlipidemia    Tolerating statin, encouraged heart healthy diet, avoid trans fats, minimize simple carbs and saturated fats. Increase exercise as tolerated      Essential hypertension    Well controlled, no changes to meds. Encouraged heart healthy diet such as the DASH diet and exercise as tolerated.       Gout - Primary    Uric acid much better, still mild pain in left great toe. Continue Allopurinol at 100 mg daily and hydrate well. Avoid offending foods.       Relevant Orders   Uric acid (Completed)   Non-insulin treated type 2 diabetes mellitus (HCC)    hgba1c acceptable, minimize simple carbs. Increase exercise as tolerated. Continue current meds      Preventative health care    Patient encouraged to maintain heart healthy diet, regular exercise, adequate sleep. Consider daily probiotics. Take medications as prescribed. Labs reviewed      Peripheral vascular disease ([place])    Identified on home ABI screen in Left leg. Due to patients numerous risk factors he is referred to vascular surgery for further work up.       Relevant Orders   Ambulatory referral to Vascular Surgery      I have discontinued Jamion L. Shorkey's doxycycline. I am also having him maintain his aspirin, Garlic Oil, COD LIVER OIL PO, promethazine, One-A-Day Mens Health Formula, latanoprost, fenofibrate, metFORMIN, tamsulosin, HYDROcodone-acetaminophen, OneTouch Delica Plus 0000000, OneTouch Ultra, hydrochlorothiazide, triamcinolone cream, dicyclomine, colchicine, allopurinol, atorvastatin, metoprolol succinate, and rOPINIRole.  No orders of the defined types were placed in this encounter.    Penni Homans, MD

## 2019-10-10 ENCOUNTER — Other Ambulatory Visit: Payer: Self-pay | Admitting: Family Medicine

## 2019-11-01 ENCOUNTER — Other Ambulatory Visit: Payer: Self-pay | Admitting: Family Medicine

## 2019-11-05 ENCOUNTER — Other Ambulatory Visit: Payer: Self-pay

## 2019-11-05 DIAGNOSIS — I739 Peripheral vascular disease, unspecified: Secondary | ICD-10-CM

## 2019-11-06 ENCOUNTER — Encounter: Payer: Medicare Other | Admitting: Vascular Surgery

## 2019-11-06 ENCOUNTER — Ambulatory Visit (HOSPITAL_COMMUNITY)
Admission: RE | Admit: 2019-11-06 | Discharge: 2019-11-06 | Disposition: A | Discharge status: Home / Self Care | Payer: Medicare Other | Source: Ambulatory Visit | Attending: Family | Admitting: Family

## 2019-11-06 ENCOUNTER — Other Ambulatory Visit: Payer: Self-pay

## 2019-11-06 DIAGNOSIS — I739 Peripheral vascular disease, unspecified: Secondary | ICD-10-CM

## 2019-12-23 ENCOUNTER — Other Ambulatory Visit: Payer: Self-pay | Admitting: Family Medicine

## 2020-01-06 ENCOUNTER — Other Ambulatory Visit: Payer: Self-pay | Admitting: Family Medicine

## 2020-01-12 ENCOUNTER — Other Ambulatory Visit: Payer: Self-pay

## 2020-01-13 ENCOUNTER — Ambulatory Visit (INDEPENDENT_AMBULATORY_CARE_PROVIDER_SITE_OTHER): Payer: Medicare Other | Admitting: Family Medicine

## 2020-01-13 ENCOUNTER — Encounter: Payer: Self-pay | Admitting: Family Medicine

## 2020-01-13 VITALS — BP 128/82 | HR 82 | Temp 96.8°F | Ht 72.0 in | Wt 224.4 lb

## 2020-01-13 DIAGNOSIS — E559 Vitamin D deficiency, unspecified: Secondary | ICD-10-CM

## 2020-01-13 DIAGNOSIS — M1A0710 Idiopathic chronic gout, right ankle and foot, without tophus (tophi): Secondary | ICD-10-CM | POA: Diagnosis not present

## 2020-01-13 DIAGNOSIS — E038 Other specified hypothyroidism: Secondary | ICD-10-CM | POA: Diagnosis not present

## 2020-01-13 DIAGNOSIS — E7849 Other hyperlipidemia: Secondary | ICD-10-CM

## 2020-01-13 DIAGNOSIS — E119 Type 2 diabetes mellitus without complications: Secondary | ICD-10-CM

## 2020-01-13 DIAGNOSIS — I10 Essential (primary) hypertension: Secondary | ICD-10-CM

## 2020-01-13 LAB — CBC
HCT: 44.6 % (ref 39.0–52.0)
Hemoglobin: 14.4 g/dL (ref 13.0–17.0)
MCHC: 32.2 g/dL (ref 30.0–36.0)
MCV: 79 fl (ref 78.0–100.0)
Platelets: 186 10*3/uL (ref 150.0–400.0)
RBC: 5.65 Mil/uL (ref 4.22–5.81)
RDW: 17.2 % — ABNORMAL HIGH (ref 11.5–15.5)
WBC: 5.9 10*3/uL (ref 4.0–10.5)

## 2020-01-13 LAB — COMPREHENSIVE METABOLIC PANEL
ALT: 26 U/L (ref 0–53)
AST: 24 U/L (ref 0–37)
Albumin: 4 g/dL (ref 3.5–5.2)
Alkaline Phosphatase: 90 U/L (ref 39–117)
BUN: 15 mg/dL (ref 6–23)
CO2: 29 mEq/L (ref 19–32)
Calcium: 9.9 mg/dL (ref 8.4–10.5)
Chloride: 98 mEq/L (ref 96–112)
Creatinine, Ser: 1.32 mg/dL (ref 0.40–1.50)
GFR: 63.98 mL/min (ref >60.00)
Glucose, Bld: 165 mg/dL — ABNORMAL HIGH (ref 70–99)
Potassium: 4.6 mEq/L (ref 3.5–5.1)
Sodium: 136 mEq/L (ref 135–145)
Total Bilirubin: 0.3 mg/dL (ref 0.2–1.2)
Total Protein: 7.8 g/dL (ref 6.0–8.3)

## 2020-01-13 LAB — LDL CHOLESTEROL, DIRECT: Direct LDL: 80 mg/dL

## 2020-01-13 LAB — LIPID PANEL
Cholesterol: 195 mg/dL (ref 0–200)
HDL: 34.5 mg/dL — ABNORMAL LOW (ref >39.00)
Total CHOL/HDL Ratio: 6
Triglycerides: 433 mg/dL — ABNORMAL HIGH (ref 0.0–149.0)

## 2020-01-13 LAB — TSH: TSH: 3.95 u[IU]/mL (ref 0.35–4.50)

## 2020-01-13 LAB — HEMOGLOBIN A1C: Hgb A1c MFr Bld: 6.9 % — ABNORMAL HIGH (ref 4.6–6.5)

## 2020-01-13 LAB — URIC ACID: Uric Acid, Serum: 6.6 mg/dL (ref 4.0–7.8)

## 2020-01-13 NOTE — Assessment & Plan Note (Signed)
Has noted some increased right knee pain over past week or so. Denies remembering any injury. Will proceed with labwork to rule out gout. Patient reminded to ice, hydrate and use topical rubs to knee

## 2020-01-13 NOTE — Assessment & Plan Note (Signed)
On Levothyroxine, continue to monitor 

## 2020-01-13 NOTE — Assessment & Plan Note (Addendum)
hgba1c increasing, minimize simple carbs. Increase exercise as tolerated. Continue current meds but increase Metformin to bid

## 2020-01-13 NOTE — Progress Notes (Signed)
Subjective:    Patient ID: Daniel Gates, male    DOB: Oct 04, 1946, 74 y.o.   MRN: [id number]  Chief Complaint  Patient presents with  . Follow-up  . Knee Pain    right    HPI Patient is in today for follow up on chronic medical concerns. No recent febrile illness or hospitalizations. No polyuria or polydipsia. He denies injuring his right knee but he has noted pain for last week without significant redness or heat. He is trying to maintain quarantine, stay as active as he is able and eat a heart healthy diet. Denies CP/palp/SOB/HA/congestion/fevers/GI or GU c/o. Taking meds as prescribed  Past Medical History:  Diagnosis Date  . Allergic rhinitis   . Anxiety 2003   "after cancer surgery"  . Asthma    childhood per pt.  . Bradycardia 10/01/2016  . Cancer ([place])    colon; diagnosed 2003  . Cataract 12/29/2016   not sure which eye  . Chronic renal insufficiency 06/07/2017  . Depression   . Diabetes type 2, controlled ([place]) 06/27/2012  . Glaucoma   . Gout   . Hyperlipidemia   . Hypertension   . Medicare annual wellness visit, subsequent 02/15/2014  . Open-angle glaucoma 12/29/2016  . Sickle cell anemia ([place]) 06/12/2017   "they say I have a trace"  . TMJ arthralgia 01/02/2016    Past Surgical History:  Procedure Laterality Date  . COLON SURGERY  2003   for colon cancer  . COLONOSCOPY    . INGUINAL HERNIA REPAIR  1997   right    Family History  Problem Relation Age of Onset  . Diabetes Mother   . Heart disease Mother        MI  . Cancer Father        prostate  . Prostate cancer Father   . Prostate cancer Brother   . Colon cancer Neg Hx   . Stomach cancer Neg Hx   . Esophageal cancer Neg Hx   . Rectal cancer Neg Hx     Social History   Socioeconomic History  . Marital status: Widowed    Spouse name: Not on file  . Number of children: 7  . Years of education: Not on file  . Highest education level: Not on file  Occupational History    Employer: DISABLED   Tobacco Use  . Smoking status: Never Smoker  . Smokeless tobacco: Never Used  Substance and Sexual Activity  . Alcohol use: No  . Drug use: No  . Sexual activity: Yes  Other Topics Concern  . Not on file  Social History Narrative  . Not on file   Social Determinants of Health   Financial Resource Strain:   . Difficulty of Paying Living Expenses: Not on file  Food Insecurity:   . Worried About Charity fundraiser in the Last Year: Not on file  . Ran Out of Food in the Last Year: Not on file  Transportation Needs:   . Lack of Transportation (Medical): Not on file  . Lack of Transportation (Non-Medical): Not on file  Physical Activity:   . Days of Exercise per Week: Not on file  . Minutes of Exercise per Session: Not on file  Stress:   . Feeling of Stress : Not on file  Social Connections:   . Frequency of Communication with Friends and Family: Not on file  . Frequency of Social Gatherings with Friends and Family: Not on file  . Attends  Religious Services: Not on file  . Active Member of Clubs or Organizations: Not on file  . Attends Archivist Meetings: Not on file  . Marital Status: Not on file  Intimate Partner Violence:   . Fear of Current or Ex-Partner: Not on file  . Emotionally Abused: Not on file  . Physically Abused: Not on file  . Sexually Abused: Not on file    Outpatient Medications Prior to Visit  Medication Sig Dispense Refill  . allopurinol (ZYLOPRIM) 100 MG tablet TAKE 1 TABLET BY MOUTH EVERY DAY 30 tablet 5  . aspirin 325 MG tablet Take 325 mg by mouth daily.     Marland Kitchen atorvastatin (LIPITOR) 40 MG tablet Take 1 tablet (40 mg total) by mouth at bedtime. 30 tablet 3  . COD LIVER OIL PO Take 1 tablet by mouth daily.     . colchicine 0.6 MG tablet 2 tabs po once then 1 tab po q 2 hours prn pain til pain gone, max of 6 tabs in 24 hours or intolerable diarrhea 6 tablet 1  . dicyclomine (BENTYL) 20 MG tablet TAKE 1 TABLET(20 MG) BY MOUTH TWICE DAILY 180  tablet 2  . escitalopram (LEXAPRO) 10 MG tablet TAKE 1 TABLET BY MOUTH DAILY 30 tablet 5  . Garlic Oil 123XX123 MG CAPS Take 1 capsule by mouth daily.     . hydrochlorothiazide (HYDRODIURIL) 25 MG tablet TAKE 1 TABLET(25 MG) BY MOUTH DAILY 90 tablet 1  . HYDROcodone-acetaminophen (NORCO) 10-325 MG tablet Take 1 tablet by mouth 2 (two) times daily as needed. 30 tablet 0  . Lancets (ONETOUCH DELICA PLUS 123XX123) MISC USE TO TEST BLOOD SUGAR TWICE DAILY AS DIRECTED 100 each 1  . latanoprost (XALATAN) 0.005 % ophthalmic solution Place 1 drop into both eyes nightly.    . metFORMIN (GLUCOPHAGE) 500 MG tablet TAKE 1 TABLET(500 MG) BY MOUTH TWICE DAILY WITH A MEAL (Patient taking differently: Take 500 mg by mouth daily with breakfast. ) 180 tablet 0  . metoprolol succinate (TOPROL-XL) 25 MG 24 hr tablet TAKE 1 TABLET(25 MG) BY MOUTH DAILY 90 tablet 2  . Multiple Vitamins-Minerals (ONE-A-DAY MENS HEALTH FORMULA) TABS Take 1 tablet by mouth daily.    Glory Rosebush ULTRA test strip USE AS DIRECTED TO CHECK BLOOD SUGAR TWICE DAILY 100 strip 12  . promethazine (PHENERGAN) 25 MG tablet TAKE ONE (1) TABLET(S) EVERY FOUR (4) HOURS AS NEEDED FOR NAUSEA 60 tablet 0  . rOPINIRole (REQUIP) 1 MG tablet TAKE 1 TO 2 TABLETS BY MOUTH EVERY NIGHT AT BEDTIME 60 tablet 0  . tamsulosin (FLOMAX) 0.4 MG CAPS capsule TAKE 1 CAPSULE(0.4 MG) BY MOUTH DAILY AFTER SUPPER 90 capsule 1  . fenofibrate (TRICOR) 145 MG tablet TAKE 1 TABLET(130 MG) BY MOUTH DAILY BEFORE BREAKFAST 30 tablet 0  . triamcinolone cream (KENALOG) 0.1 % Apply 1 application topically 2 (two) times daily. (Patient not taking: Reported on 01/13/2020) 30 g 0   No facility-administered medications prior to visit.    No Known Allergies  Review of Systems  Constitutional: Negative for fever and malaise/fatigue.  HENT: Negative for congestion.   Eyes: Negative for blurred vision.  Respiratory: Negative for shortness of breath.   Cardiovascular: Negative for chest  pain, palpitations and leg swelling.  Gastrointestinal: Negative for abdominal pain, blood in stool and nausea.  Genitourinary: Negative for dysuria and frequency.  Musculoskeletal: Negative for falls.  Skin: Negative for rash.  Neurological: Negative for dizziness, loss of consciousness and headaches.  Endo/Heme/Allergies:  Negative for environmental allergies.  Psychiatric/Behavioral: Negative for depression. The patient is not nervous/anxious.        Objective:    Physical Exam Vitals and nursing note reviewed.  Constitutional:      General: He is not in acute distress.    Appearance: He is well-developed.  HENT:     Head: Normocephalic and atraumatic.     Nose: Nose normal.  Eyes:     General:        Right eye: No discharge.        Left eye: No discharge.  Cardiovascular:     Rate and Rhythm: Normal rate and regular rhythm.     Heart sounds: No murmur.  Pulmonary:     Effort: Pulmonary effort is normal.     Breath sounds: Normal breath sounds.  Abdominal:     General: Bowel sounds are normal.     Palpations: Abdomen is soft.     Tenderness: There is no abdominal tenderness.  Musculoskeletal:     Cervical back: Normal range of motion and neck supple.  Skin:    General: Skin is warm and dry.  Neurological:     Mental Status: He is alert and oriented to person, place, and time.     BP 128/82 (BP Location: Left Arm, Patient Position: Sitting, Cuff Size: Large)   Pulse 82   Temp (!) 96.8 F (36 C) (Temporal)   Ht 6' (1.829 m)   Wt 224 lb 6 oz (101.8 kg)   SpO2 96%   BMI 30.43 kg/m  Wt Readings from Last 3 Encounters:  01/13/20 224 lb 6 oz (101.8 kg)  09/11/19 216 lb 9.6 oz (98.2 kg)  07/24/19 215 lb (97.5 kg)    Diabetic Foot Exam - Simple   No data filed     Lab Results  Component Value Date   WBC 5.9 01/13/2020   HGB 14.4 01/13/2020   HCT 44.6 01/13/2020   PLT 186.0 01/13/2020   GLUCOSE 165 (H) 01/13/2020   CHOL 195 01/13/2020   TRIG (H)  01/13/2020    433.0 Triglyceride is over 400; calculations on Lipids are invalid.   HDL 34.50 (L) 01/13/2020   LDLDIRECT 80.0 01/13/2020   LDLCALC 89 02/05/2018   ALT 26 01/13/2020   AST 24 01/13/2020   NA 136 01/13/2020   K 4.6 01/13/2020   CL 98 01/13/2020   CREATININE 1.32 01/13/2020   BUN 15 01/13/2020   CO2 29 01/13/2020   TSH 3.95 01/13/2020   PSA 9.10 (H) 02/05/2018   HGBA1C 6.9 (H) 01/13/2020   MICROALBUR <0.7 02/05/2018    Lab Results  Component Value Date   TSH 3.95 01/13/2020   Lab Results  Component Value Date   WBC 5.9 01/13/2020   HGB 14.4 01/13/2020   HCT 44.6 01/13/2020   MCV 79.0 01/13/2020   PLT 186.0 01/13/2020   Lab Results  Component Value Date   NA 136 01/13/2020   K 4.6 01/13/2020   CO2 29 01/13/2020   GLUCOSE 165 (H) 01/13/2020   BUN 15 01/13/2020   CREATININE 1.32 01/13/2020   BILITOT 0.3 01/13/2020   ALKPHOS 90 01/13/2020   AST 24 01/13/2020   ALT 26 01/13/2020   PROT 7.8 01/13/2020   ALBUMIN 4.0 01/13/2020   CALCIUM 9.9 01/13/2020   GFR 63.98 01/13/2020   Lab Results  Component Value Date   CHOL 195 01/13/2020   Lab Results  Component Value Date   HDL 34.50 (L) 01/13/2020  Lab Results  Component Value Date   LDLCALC 89 02/05/2018   Lab Results  Component Value Date   TRIG (H) 01/13/2020    433.0 Triglyceride is over 400; calculations on Lipids are invalid.   Lab Results  Component Value Date   CHOLHDL 6 01/13/2020   Lab Results  Component Value Date   HGBA1C 6.9 (H) 01/13/2020       Assessment & Plan:   Problem List Items Addressed This Visit    Hypothyroidism    On Levothyroxine, continue to monitor      Relevant Orders   TSH (Completed)   Vitamin D deficiency    Supplement and monitor       Hyperlipidemia    Tolerating statin, encouraged heart healthy diet, avoid trans fats, minimize simple carbs and saturated fats. Increase exercise as tolerated. If triglycerides do not improve with Metformin and  other adjustments will require additional medications.       Relevant Orders   Lipid panel (Completed)   Essential hypertension    Well controlled, no changes to meds. Encouraged heart healthy diet such as the DASH diet and exercise as tolerated.       Relevant Orders   CBC (Completed)   Comprehensive metabolic panel (Completed)   Gout - Primary    Has noted some increased right knee pain over past week or so. Denies remembering any injury. Will proceed with labwork to rule out gout. Patient reminded to ice, hydrate and use topical rubs to knee      Relevant Orders   Uric acid (Completed)   Non-insulin treated type 2 diabetes mellitus ([place])    hgba1c increasing, minimize simple carbs. Increase exercise as tolerated. Continue current meds but increase Metformin to bid      Relevant Orders   Hemoglobin A1c (Completed)      I am having Norah L. Johnnye Sima maintain his aspirin, Garlic Oil, COD LIVER OIL PO, promethazine, One-A-Day Mens Health Formula, latanoprost, fenofibrate, metFORMIN, tamsulosin, HYDROcodone-acetaminophen, OneTouch Ultra, triamcinolone cream, dicyclomine, colchicine, atorvastatin, metoprolol succinate, escitalopram, OneTouch Delica Plus 0000000, rOPINIRole, hydrochlorothiazide, and allopurinol.  No orders of the defined types were placed in this encounter.    Penni Homans, MD

## 2020-01-13 NOTE — Assessment & Plan Note (Addendum)
Tolerating statin, encouraged heart healthy diet, avoid trans fats, minimize simple carbs and saturated fats. Increase exercise as tolerated. If triglycerides do not improve with Metformin and other adjustments will require additional medications.

## 2020-01-13 NOTE — Patient Instructions (Addendum)
[place] Blood Pressure cuff, upper arm, want BP 100-140/60-90 Pulse oximeter, want oxygen in 90s  Weekly vitals  Take Multivitamin with minerals, selenium Vitamin D 1000-2000 IU daily Probiotic with lactobacillus and bifidophilus Asprin EC 81 mg daily  Melatonin 2-5 mg at bedtime  [email or web address] Hypertension, Adult High blood pressure (hypertension) is when the force of blood pumping through the arteries is too strong. The arteries are the blood vessels that carry blood from the heart throughout the body. Hypertension forces the heart to work harder to pump blood and may cause arteries to become narrow or stiff. Untreated or uncontrolled hypertension can cause a heart attack, heart failure, a stroke, kidney disease, and other problems. A blood pressure reading consists of a higher number over a lower number. Ideally, your blood pressure should be below 120/80. The first ("top") number is called the systolic pressure. It is a measure of the pressure in your arteries as your heart beats. The second ("bottom") number is called the diastolic pressure. It is a measure of the pressure in your arteries as the heart relaxes. What are the causes? The exact cause of this condition is not known. There are some conditions that result in or are related to high blood pressure. What increases the risk? Some risk factors for high blood pressure are under your control. The following factors may make you more likely to develop this condition:  Smoking.  Having type 2 diabetes mellitus, high cholesterol, or both.  Not getting enough exercise or physical activity.  Being overweight.  Having too much fat, sugar, calories, or salt (sodium) in your diet.  Drinking too much alcohol. Some risk factors for high blood pressure may be difficult or impossible to change. Some of these factors include:  Having chronic kidney disease.  Having a family history of high blood  pressure.  Age. Risk increases with age.  Race. You may be at higher risk if you are African American.  Gender. Men are at higher risk than women before age 28. After age 24, women are at higher risk than men.  Having obstructive sleep apnea.  Stress. What are the signs or symptoms? High blood pressure may not cause symptoms. Very high blood pressure (hypertensive crisis) may cause:  Headache.  Anxiety.  Shortness of breath.  Nosebleed.  Nausea and vomiting.  Vision changes.  Severe chest pain.  Seizures. How is this diagnosed? This condition is diagnosed by measuring your blood pressure while you are seated, with your arm resting on a flat surface, your legs uncrossed, and your feet flat on the floor. The cuff of the blood pressure monitor will be placed directly against the skin of your upper arm at the level of your heart. It should be measured at least twice using the same arm. Certain conditions can cause a difference in blood pressure between your right and left arms. Certain factors can cause blood pressure readings to be lower or higher than normal for a short period of time:  When your blood pressure is higher when you are in a health care provider's office than when you are at home, this is called white coat hypertension. Most people with this condition do not need medicines.  When your blood pressure is higher at home than when you are in a health care provider's office, this is called masked hypertension. Most people with this condition may need medicines to control blood pressure. If you have a high blood pressure reading during one visit or you have normal  blood pressure with other risk factors, you may be asked to:  Return on a different day to have your blood pressure checked again.  Monitor your blood pressure at home for 1 week or longer. If you are diagnosed with hypertension, you may have other blood or imaging tests to help your health care provider  understand your overall risk for other conditions. How is this treated? This condition is treated by making healthy lifestyle changes, such as eating healthy foods, exercising more, and reducing your alcohol intake. Your health care provider may prescribe medicine if lifestyle changes are not enough to get your blood pressure under control, and if:  Your systolic blood pressure is above 130.  Your diastolic blood pressure is above 80. Your personal target blood pressure may vary depending on your medical conditions, your age, and other factors. Follow these instructions at home: Eating and drinking   Eat a diet that is high in fiber and potassium, and low in sodium, added sugar, and fat. An example eating plan is called the DASH (Dietary Approaches to Stop Hypertension) diet. To eat this way: ? Eat plenty of fresh fruits and vegetables. Try to fill one half of your plate at each meal with fruits and vegetables. ? Eat whole grains, such as whole-wheat pasta, brown rice, or whole-grain bread. Fill about one fourth of your plate with whole grains. ? Eat or drink low-fat dairy products, such as skim milk or low-fat yogurt. ? Avoid fatty cuts of meat, processed or cured meats, and poultry with skin. Fill about one fourth of your plate with lean proteins, such as fish, chicken without skin, beans, eggs, or tofu. ? Avoid pre-made and processed foods. These tend to be higher in sodium, added sugar, and fat.  Reduce your daily sodium intake. Most people with hypertension should eat less than 1,500 mg of sodium a day.  Do not drink alcohol if: ? Your health care provider tells you not to drink. ? You are pregnant, may be pregnant, or are planning to become pregnant.  If you drink alcohol: ? Limit how much you use to:  0-1 drink a day for women.  0-2 drinks a day for men. ? Be aware of how much alcohol is in your drink. In the [place] one drink equals one 12 oz bottle of beer (355 mL), one 5 oz  glass of wine (148 mL), or one 1 oz glass of hard liquor (44 mL). Lifestyle   Work with your health care provider to maintain a healthy body weight or to lose weight. Ask what an ideal weight is for you.  Get at least 30 minutes of exercise most days of the week. Activities may include walking, swimming, or biking.  Include exercise to strengthen your muscles (resistance exercise), such as Pilates or lifting weights, as part of your weekly exercise routine. Try to do these types of exercises for 30 minutes at least 3 days a week.  Do not use any products that contain nicotine or tobacco, such as cigarettes, e-cigarettes, and chewing tobacco. If you need help quitting, ask your health care provider.  Monitor your blood pressure at home as told by your health care provider.  Keep all follow-up visits as told by your health care provider. This is important. Medicines  Take over-the-counter and prescription medicines only as told by your health care provider. Follow directions carefully. Blood pressure medicines must be taken as prescribed.  Do not skip doses of blood pressure medicine. Doing this puts  you at risk for problems and can make the medicine less effective.  Ask your health care provider about side effects or reactions to medicines that you should watch for. Contact a health care provider if you:  Think you are having a reaction to a medicine you are taking.  Have headaches that keep coming back (recurring).  Feel dizzy.  Have swelling in your ankles.  Have trouble with your vision. Get help right away if you:  Develop a severe headache or confusion.  Have unusual weakness or numbness.  Feel faint.  Have severe pain in your chest or abdomen.  Vomit repeatedly.  Have trouble breathing. Summary  Hypertension is when the force of blood pumping through your arteries is too strong. If this condition is not controlled, it may put you at risk for serious  complications.  Your personal target blood pressure may vary depending on your medical conditions, your age, and other factors. For most people, a normal blood pressure is less than 120/80.  Hypertension is treated with lifestyle changes, medicines, or a combination of both. Lifestyle changes include losing weight, eating a healthy, low-sodium diet, exercising more, and limiting alcohol. This information is not intended to replace advice given to you by your health care provider. Make sure you discuss any questions you have with your health care provider. Document Revised: 07/31/2018 Document Reviewed: 07/31/2018 Elsevier Patient Education  [place]. arm, want BP 100-140/60-90 Pulse oximeter, want oxygen in 90s  Weekly vitals  Take Multivitamin with minerals, selenium Vitamin D 1000-2000 IU daily Probiotic with lactobacillus and bifidophilus Asprin EC 81 mg daily  Melatonin 2-5 mg at bedtime  [email or web address]  [phone] for COVID 19 vaccine Acute Knee Pain, Adult Acute knee pain is sudden and may be caused by damage, swelling, or irritation of the muscles and tissues that support your knee. The injury may result from:  A fall.  An injury to your knee from twisting motions.  A hit to the knee.  Infection. Acute knee pain may go away on its own with time and rest. If it does not, your health care provider may order tests to find the cause of the pain. These may include:  Imaging tests, such as an X-ray, MRI, or ultrasound.  Joint aspiration. In this test, fluid is removed from the knee.  Arthroscopy. In this test, a lighted tube is inserted into the knee and an image is projected onto a TV screen.  Biopsy. In this test, a sample of tissue is removed from the body and studied under a microscope. Follow these instructions at home: Pay attention to any changes in your symptoms. Take these actions to relieve your pain. If you  have a knee sleeve or brace:   Wear the sleeve or brace as told by your health care provider. Remove it only as told by your health care provider.  Loosen the sleeve or brace if your toes tingle, become numb, or turn cold and blue.  Keep the sleeve or brace clean.  If the sleeve or brace is not waterproof: ? Do not let it get wet. ? Cover it with a watertight covering when you take a bath or shower. Activity  Rest your knee.  Do not do things that cause pain or make pain worse.  Avoid high-impact activities or exercises, such as running, jumping rope, or doing jumping jacks.  Work with a physical therapist to make a safe exercise program, as recommended by your health care provider.  Do exercises as told by your physical therapist. Managing pain, stiffness, and swelling   If directed, put ice on the knee: ? Put ice in a plastic bag. ? Place a towel between your skin and the bag. ? Leave the ice on for 20 minutes, 2-3 times a day.  If directed, use an elastic bandage to put pressure (compression) on your injured knee. This may control swelling, give support, and help with discomfort. General instructions  Take over-the-counter and prescription medicines only as told by your health care provider.  Raise (elevate) your knee above the level of your heart when you are sitting or lying down.  Sleep with a pillow under your knee.  Do not use any products that contain nicotine or tobacco, such as cigarettes, e-cigarettes, and chewing tobacco. These can delay healing. If you need help quitting, ask your health care provider.  If you are overweight, work with your health care provider and a dietitian to set a weight-loss goal that is healthy and reasonable for you. Extra weight can put pressure on your knee.  Keep all follow-up visits as told by your health care provider. This is important. Contact a health care provider if:  Your knee pain continues, changes, or gets worse.  You  have a fever along with knee pain.  Your knee feels warm to the touch.  Your knee buckles or locks up. Get help right away if:  Your knee swells, and the swelling becomes worse.  You cannot move your knee.  You have severe pain in your knee. Summary  Acute knee pain can be caused by a fall, an injury, an infection, or damage, swelling, or irritation of the tissues that support your knee.  Your health care provider may perform tests to find out the cause of the pain.  Pay attention to any changes in your symptoms. Relieve your pain with rest, medicines, light activity, and use of ice.  Get help if your pain continues or becomes worse, your knee swells, or you cannot move your knee. This information is not intended to replace advice given to you by your health care provider. Make sure you discuss any questions you have with your health care provider. Document Revised: 05/02/2018 Document Reviewed: 05/02/2018 Elsevier Patient Education  [place].

## 2020-01-13 NOTE — Assessment & Plan Note (Signed)
Supplement and monitor 

## 2020-01-13 NOTE — Assessment & Plan Note (Signed)
Well controlled, no changes to meds. Encouraged heart healthy diet such as the DASH diet and exercise as tolerated.  °

## 2020-01-18 ENCOUNTER — Other Ambulatory Visit: Payer: Self-pay | Admitting: Family Medicine

## 2020-01-27 ENCOUNTER — Other Ambulatory Visit: Payer: Self-pay

## 2020-01-27 ENCOUNTER — Ambulatory Visit (HOSPITAL_COMMUNITY)
Admission: RE | Admit: 2020-01-27 | Discharge: 2020-01-27 | Disposition: A | Discharge status: Home / Self Care | Payer: Medicare Other | Source: Ambulatory Visit | Attending: Vascular Surgery | Admitting: Vascular Surgery

## 2020-01-27 ENCOUNTER — Ambulatory Visit: Payer: Medicare Other | Admitting: Vascular Surgery

## 2020-01-27 ENCOUNTER — Encounter: Payer: Self-pay | Admitting: Vascular Surgery

## 2020-01-27 VITALS — BP 127/83 | HR 72 | Temp 96.5°F | Resp 18 | Ht 72.0 in | Wt 218.8 lb

## 2020-01-27 DIAGNOSIS — M25561 Pain in right knee: Secondary | ICD-10-CM | POA: Diagnosis not present

## 2020-01-27 DIAGNOSIS — I739 Peripheral vascular disease, unspecified: Secondary | ICD-10-CM | POA: Diagnosis not present

## 2020-01-27 DIAGNOSIS — M25562 Pain in left knee: Secondary | ICD-10-CM | POA: Diagnosis not present

## 2020-01-27 NOTE — Progress Notes (Signed)
[facility] [place]  Patient name: Daniel Gates MRN: [id number] DOB: October 20, 1946 Sex: male  REASON FOR CONSULT: Evaluate possible lower extremity arterial insufficiency with leg discomfort  HPI: ROMYN Gates is a 74 y.o. male, who is here today for discussion of leg discomfort.  He has a history of gout and this is under control.  He is also type II diabetic.  He does not have claudication symptoms but reports occasional discomfort in both lower extremities.  This is not brought on by walking.  He does have evidence of chronic venous hypertension with telangiectasia over both lower extremities.  Reports that his mother also had difficulty with venous varicosities.  He does not have any significant swelling in his lower extremities.  Past Medical History:  Diagnosis Date  . Allergic rhinitis   . Anxiety 2003   "after cancer surgery"  . Asthma    childhood per pt.  . Bradycardia 10/01/2016  . Cancer ([place])    colon; diagnosed 2003  . Cataract 12/29/2016   not sure which eye  . Chronic renal insufficiency 06/07/2017  . Depression   . Diabetes type 2, controlled ([place]) 06/27/2012  . Glaucoma   . Gout   . Hyperlipidemia   . Hypertension   . Medicare annual wellness visit, subsequent 02/15/2014  . Open-angle glaucoma 12/29/2016  . Sickle cell anemia ([place]) 06/12/2017   "they say I have a trace"  . TMJ arthralgia 01/02/2016    Family History  Problem Relation Age of Onset  . Diabetes Mother   . Heart disease Mother        MI  . Cancer Father        prostate  . Prostate cancer Father   . Prostate cancer Brother   . Colon cancer Neg Hx   . Stomach cancer Neg Hx   . Esophageal cancer Neg Hx   . Rectal cancer Neg Hx     SOCIAL HISTORY: Social History   Socioeconomic History  . Marital status: Widowed    Spouse name: Not on file  . Number of children: 7  . Years of education: Not on file  . Highest education level:  Not on file  Occupational History    Employer: DISABLED  Tobacco Use  . Smoking status: Never Smoker  . Smokeless tobacco: Never Used  Substance and Sexual Activity  . Alcohol use: No  . Drug use: No  . Sexual activity: Yes  Other Topics Concern  . Not on file  Social History Narrative  . Not on file   Social Determinants of Health   Financial Resource Strain:   . Difficulty of Paying Living Expenses: Not on file  Food Insecurity:   . Worried About Charity fundraiser in the Last Year: Not on file  . Ran Out of Food in the Last Year: Not on file  Transportation Needs:   . Lack of Transportation (Medical): Not on file  . Lack of Transportation (Non-Medical): Not on file  Physical Activity:   . Days of Exercise per Week: Not on file  . Minutes of Exercise per Session: Not on file  Stress:   . Feeling of Stress : Not on file  Social Connections:   . Frequency of Communication with Friends and Family: Not on file  . Frequency of Social Gatherings with Friends and Family: Not on file  . Attends Religious Services: Not on file  . Active Member of Clubs or Organizations: Not  on file  . Attends Archivist Meetings: Not on file  . Marital Status: Not on file  Intimate Partner Violence:   . Fear of Current or Ex-Partner: Not on file  . Emotionally Abused: Not on file  . Physically Abused: Not on file  . Sexually Abused: Not on file    No Known Allergies  Current Outpatient Medications  Medication Sig Dispense Refill  . allopurinol (ZYLOPRIM) 100 MG tablet TAKE 1 TABLET BY MOUTH EVERY DAY 30 tablet 5  . aspirin 325 MG tablet Take 325 mg by mouth daily.     Marland Kitchen atorvastatin (LIPITOR) 40 MG tablet Take 1 tablet (40 mg total) by mouth at bedtime. 30 tablet 3  . COD LIVER OIL PO Take 1 tablet by mouth daily.     . colchicine 0.6 MG tablet 2 tabs po once then 1 tab po q 2 hours prn pain til pain gone, max of 6 tabs in 24 hours or intolerable diarrhea 6 tablet 1  .  dicyclomine (BENTYL) 20 MG tablet TAKE 1 TABLET(20 MG) BY MOUTH TWICE DAILY 180 tablet 2  . escitalopram (LEXAPRO) 10 MG tablet TAKE 1 TABLET BY MOUTH DAILY 30 tablet 5  . fenofibrate (TRICOR) 145 MG tablet TAKE 1 TABLET(130 MG) BY MOUTH DAILY BEFORE BREAKFAST 30 tablet 0  . Garlic Oil 123XX123 MG CAPS Take 1 capsule by mouth daily.     . hydrochlorothiazide (HYDRODIURIL) 25 MG tablet TAKE 1 TABLET(25 MG) BY MOUTH DAILY 90 tablet 1  . HYDROcodone-acetaminophen (NORCO) 10-325 MG tablet Take 1 tablet by mouth 2 (two) times daily as needed. 30 tablet 0  . Lancets (ONETOUCH DELICA PLUS 123XX123) MISC USE TO TEST BLOOD SUGAR TWICE DAILY AS DIRECTED 100 each 1  . latanoprost (XALATAN) 0.005 % ophthalmic solution Place 1 drop into both eyes nightly.    . metFORMIN (GLUCOPHAGE) 500 MG tablet TAKE 1 TABLET(500 MG) BY MOUTH TWICE DAILY WITH A MEAL (Patient taking differently: Take 500 mg by mouth daily with breakfast. ) 180 tablet 0  . metoprolol succinate (TOPROL-XL) 25 MG 24 hr tablet TAKE 1 TABLET(25 MG) BY MOUTH DAILY 90 tablet 2  . Multiple Vitamins-Minerals (ONE-A-DAY MENS HEALTH FORMULA) TABS Take 1 tablet by mouth daily.    Glory Rosebush ULTRA test strip USE AS DIRECTED TO CHECK BLOOD SUGAR TWICE DAILY 100 strip 12  . promethazine (PHENERGAN) 25 MG tablet TAKE ONE (1) TABLET(S) EVERY FOUR (4) HOURS AS NEEDED FOR NAUSEA 60 tablet 0  . rOPINIRole (REQUIP) 1 MG tablet TAKE 1 TO 2 TABLETS BY MOUTH EVERY NIGHT AT BEDTIME 60 tablet 0  . tamsulosin (FLOMAX) 0.4 MG CAPS capsule TAKE 1 CAPSULE(0.4 MG) BY MOUTH DAILY AFTER SUPPER 90 capsule 1  . triamcinolone cream (KENALOG) 0.1 % Apply 1 application topically 2 (two) times daily. 30 g 0   No current facility-administered medications for this visit.    REVIEW OF SYSTEMS:  [X]  denotes positive finding, [ ]  denotes negative finding Cardiac  Comments:  Chest pain or chest pressure:    Shortness of breath upon exertion: x   Short of breath when lying flat:      Irregular heart rhythm:        Vascular    Pain in calf, thigh, or hip brought on by ambulation:    Pain in feet at night that wakes you up from your sleep:     Blood clot in your veins:    Leg swelling:  Pulmonary    Oxygen at home:    Productive cough:     Wheezing:         Neurologic    Sudden weakness in arms or legs:  x   Sudden numbness in arms or legs:  x   Sudden onset of difficulty speaking or slurred speech:    Temporary loss of vision in one eye:     Problems with dizziness:         Gastrointestinal    Blood in stool:     Vomited blood:         Genitourinary    Burning when urinating:     Blood in urine:        Psychiatric    Major depression:         Hematologic    Bleeding problems:    Problems with blood clotting too easily:        Skin    Rashes or ulcers:        Constitutional    Fever or chills:      PHYSICAL EXAM: Vitals:   01/27/20 0924  BP: 127/83  Pulse: 72  Resp: 18  Temp: (!) 96.5 F (35.8 C)  TempSrc: Temporal  SpO2: 96%  Weight: 218 lb 12.8 oz (99.2 kg)  Height: 6' (1.829 m)    GENERAL: The patient is a well-nourished male, in no acute distress. The vital signs are documented above. CARDIOVASCULAR: Carotid arteries without bruits bilaterally.  2+ radial pulses bilaterally.  2+ femoral and 2+ dorsalis pedis pulses bilaterally PULMONARY: There is good air exchange  ABDOMEN: Soft and non-tender.  No bruits noted MUSCULOSKELETAL: There are no major deformities or cyanosis. NEUROLOGIC: No focal weakness or paresthesias are detected. SKIN: There are no ulcers or rashes noted. PSYCHIATRIC: The patient has a normal affect.  DATA:  Noninvasive studies were reviewed with the patient.  This shows normal ankle arm index bilaterally and normal triphasic waveforms at the dorsalis pedis and posterior tibial bilaterally  MEDICAL ISSUES: Discussed these findings with the patient.  He has no evidence of arterial insufficiency.  He  does have mild venous insufficiency with no swelling.  Explained that should he develop swelling in the future treatment would be elevation and compression.  He was reassured with this discussion will see [place] again on an as-needed basis   Rosetta Posner, MD [facility] Tel [phone] Pager [phone]

## 2020-02-01 ENCOUNTER — Ambulatory Visit: Payer: Medicare Other | Attending: Internal Medicine

## 2020-02-01 DIAGNOSIS — Z23 Encounter for immunization: Secondary | ICD-10-CM

## 2020-02-01 NOTE — Progress Notes (Signed)
   Covid-19 Vaccination Clinic  Name:  Daniel Gates    MRN: [id number] DOB: 11-05-1946  02/01/2020  Daniel Gates was observed post Covid-19 immunization for 15 minutes without incidence. He was provided with Vaccine Information Sheet and instruction to access the V-Safe system.   Daniel Gates was instructed to call 911 with any severe reactions post vaccine: Marland Kitchen Difficulty breathing  . Swelling of your face and throat  . A fast heartbeat  . A bad rash all over your body  . Dizziness and weakness    Immunizations Administered    Name Date Dose VIS Date Route   Pfizer COVID-19 Vaccine 02/01/2020 10:24 AM 0.3 mL 11/14/2019 Intramuscular   Manufacturer: [place]   Lot: [id number]   [place]: [id number]

## 2020-02-03 ENCOUNTER — Other Ambulatory Visit: Payer: Self-pay | Admitting: Family Medicine

## 2020-02-03 NOTE — Telephone Encounter (Signed)
SB-I was researching pt chart/I notice that his Triglycerides are even higher than last time but according to last fill of Finofibrate 145 which looks like he is not taking/plz advise/thx dmf

## 2020-02-03 NOTE — Telephone Encounter (Signed)
Good catch, see if he is willing to keep taking the fenofibrate for hsi triglycerides he really needs it. We can send in a refill if he is willing.

## 2020-02-04 ENCOUNTER — Other Ambulatory Visit: Payer: Self-pay

## 2020-02-04 MED ORDER — FENOFIBRATE 145 MG PO TABS: ORAL_TABLET | ORAL | 3 refills | Status: DC

## 2020-02-04 NOTE — Telephone Encounter (Signed)
Thank you :)

## 2020-02-04 NOTE — Telephone Encounter (Signed)
FYI: SB-I discussed this in detail with patient and he is willing to restart Fenofibrate/I have sent this in/thx dmf

## 2020-02-25 ENCOUNTER — Ambulatory Visit: Payer: Medicare Other

## 2020-03-01 ENCOUNTER — Other Ambulatory Visit: Payer: Self-pay | Admitting: Family Medicine

## 2020-03-10 ENCOUNTER — Ambulatory Visit: Payer: Medicare Other | Attending: Internal Medicine

## 2020-03-10 DIAGNOSIS — Z23 Encounter for immunization: Secondary | ICD-10-CM

## 2020-03-10 NOTE — Progress Notes (Signed)
   Covid-19 Vaccination Clinic  Name:  Daniel Gates    MRN: [id number] DOB: 1946-05-20  03/10/2020  Daniel Gates was observed post Covid-19 immunization for 15 minutes without incident. He was provided with Vaccine Information Sheet and instruction to access the V-Safe system.   Daniel Gates was instructed to call 911 with any severe reactions post vaccine: Marland Kitchen Difficulty breathing  . Swelling of face and throat  . A fast heartbeat  . A bad rash all over body  . Dizziness and weakness   Immunizations Administered    Name Date Dose VIS Date Route   Pfizer COVID-19 Vaccine 03/10/2020  2:58 PM 0.3 mL 11/14/2019 Intramuscular   Manufacturer: [place]   Lot: [id number]   [place]: [id number]

## 2020-04-07 ENCOUNTER — Other Ambulatory Visit: Payer: Self-pay | Admitting: Family Medicine

## 2020-04-20 DIAGNOSIS — H52203 Unspecified astigmatism, bilateral: Secondary | ICD-10-CM | POA: Diagnosis not present

## 2020-04-20 DIAGNOSIS — H524 Presbyopia: Secondary | ICD-10-CM | POA: Diagnosis not present

## 2020-04-20 DIAGNOSIS — E119 Type 2 diabetes mellitus without complications: Secondary | ICD-10-CM | POA: Diagnosis not present

## 2020-04-20 DIAGNOSIS — Z7984 Long term (current) use of oral hypoglycemic drugs: Secondary | ICD-10-CM | POA: Diagnosis not present

## 2020-04-20 DIAGNOSIS — H401132 Primary open-angle glaucoma, bilateral, moderate stage: Secondary | ICD-10-CM | POA: Diagnosis not present

## 2020-04-20 DIAGNOSIS — H35033 Hypertensive retinopathy, bilateral: Secondary | ICD-10-CM | POA: Diagnosis not present

## 2020-04-20 DIAGNOSIS — H2513 Age-related nuclear cataract, bilateral: Secondary | ICD-10-CM | POA: Diagnosis not present

## 2020-04-23 ENCOUNTER — Other Ambulatory Visit: Payer: Self-pay | Admitting: Family Medicine

## 2020-04-28 ENCOUNTER — Other Ambulatory Visit: Payer: Self-pay | Admitting: Family Medicine

## 2020-05-27 ENCOUNTER — Encounter: Payer: Self-pay | Admitting: *Deleted

## 2020-06-10 ENCOUNTER — Other Ambulatory Visit: Payer: Self-pay | Admitting: Family Medicine

## 2020-06-15 ENCOUNTER — Encounter: Payer: Self-pay | Admitting: Family Medicine

## 2020-06-15 ENCOUNTER — Ambulatory Visit (INDEPENDENT_AMBULATORY_CARE_PROVIDER_SITE_OTHER): Payer: Medicare Other | Admitting: Family Medicine

## 2020-06-15 ENCOUNTER — Other Ambulatory Visit: Payer: Self-pay

## 2020-06-15 VITALS — BP 102/66 | HR 62 | Temp 98.5°F | Resp 12 | Ht 71.0 in | Wt 200.0 lb

## 2020-06-15 DIAGNOSIS — G8929 Other chronic pain: Secondary | ICD-10-CM | POA: Diagnosis not present

## 2020-06-15 DIAGNOSIS — M545 Low back pain, unspecified: Secondary | ICD-10-CM

## 2020-06-15 DIAGNOSIS — M1A0710 Idiopathic chronic gout, right ankle and foot, without tophus (tophi): Secondary | ICD-10-CM | POA: Diagnosis not present

## 2020-06-15 DIAGNOSIS — M25512 Pain in left shoulder: Secondary | ICD-10-CM | POA: Diagnosis not present

## 2020-06-15 DIAGNOSIS — I10 Essential (primary) hypertension: Secondary | ICD-10-CM | POA: Diagnosis not present

## 2020-06-15 DIAGNOSIS — E7849 Other hyperlipidemia: Secondary | ICD-10-CM

## 2020-06-15 DIAGNOSIS — E119 Type 2 diabetes mellitus without complications: Secondary | ICD-10-CM | POA: Diagnosis not present

## 2020-06-15 DIAGNOSIS — E559 Vitamin D deficiency, unspecified: Secondary | ICD-10-CM

## 2020-06-15 DIAGNOSIS — Z79899 Other long term (current) drug therapy: Secondary | ICD-10-CM | POA: Diagnosis not present

## 2020-06-15 DIAGNOSIS — E038 Other specified hypothyroidism: Secondary | ICD-10-CM | POA: Diagnosis not present

## 2020-06-15 LAB — LIPID PANEL
Cholesterol: 146 mg/dL (ref 0–200)
HDL: 38 mg/dL — ABNORMAL LOW (ref >39.00)
LDL Cholesterol: 84 mg/dL (ref 0–99)
NonHDL: 108.39
Total CHOL/HDL Ratio: 4
Triglycerides: 123 mg/dL (ref 0.0–149.0)
VLDL: 24.6 mg/dL (ref 0.0–40.0)

## 2020-06-15 LAB — COMPREHENSIVE METABOLIC PANEL
ALT: 22 U/L (ref 0–53)
AST: 24 U/L (ref 0–37)
Albumin: 4.2 g/dL (ref 3.5–5.2)
Alkaline Phosphatase: 39 U/L (ref 39–117)
BUN: 25 mg/dL — ABNORMAL HIGH (ref 6–23)
CO2: 28 mEq/L (ref 19–32)
Calcium: 9.9 mg/dL (ref 8.4–10.5)
Chloride: 102 mEq/L (ref 96–112)
Creatinine, Ser: 1.76 mg/dL — ABNORMAL HIGH (ref 0.40–1.50)
GFR: 45.85 mL/min — ABNORMAL LOW (ref >60.00)
Glucose, Bld: 109 mg/dL — ABNORMAL HIGH (ref 70–99)
Potassium: 4 mEq/L (ref 3.5–5.1)
Sodium: 138 mEq/L (ref 135–145)
Total Bilirubin: 0.4 mg/dL (ref 0.2–1.2)
Total Protein: 7.9 g/dL (ref 6.0–8.3)

## 2020-06-15 LAB — CBC
HCT: 42 % (ref 39.0–52.0)
Hemoglobin: 13.5 g/dL (ref 13.0–17.0)
MCHC: 32.1 g/dL (ref 30.0–36.0)
MCV: 79.2 fl (ref 78.0–100.0)
Platelets: 256 10*3/uL (ref 150.0–400.0)
RBC: 5.3 Mil/uL (ref 4.22–5.81)
RDW: 16.9 % — ABNORMAL HIGH (ref 11.5–15.5)
WBC: 6.5 10*3/uL (ref 4.0–10.5)

## 2020-06-15 LAB — VITAMIN D 25 HYDROXY (VIT D DEFICIENCY, FRACTURES): VITD: 37.1 ng/mL (ref 30.00–100.00)

## 2020-06-15 LAB — HEMOGLOBIN A1C: Hgb A1c MFr Bld: 6 % (ref 4.6–6.5)

## 2020-06-15 LAB — URIC ACID: Uric Acid, Serum: 5.5 mg/dL (ref 4.0–7.8)

## 2020-06-15 LAB — TSH: TSH: 3.1 u[IU]/mL (ref 0.35–4.50)

## 2020-06-15 MED ORDER — HYDROCODONE-ACETAMINOPHEN 10-325 MG PO TABS: 1.0000 | ORAL_TABLET | Freq: Two times a day (BID) | ORAL | 0 refills | Status: DC | PRN

## 2020-06-16 ENCOUNTER — Other Ambulatory Visit: Payer: Self-pay | Admitting: Family Medicine

## 2020-06-16 LAB — DRUG MONITORING, PANEL 8 WITH CONFIRMATION, URINE
6 Acetylmorphine: NEGATIVE ng/mL (ref <10)
Alcohol Metabolites: NEGATIVE ng/mL
Amphetamines: NEGATIVE ng/mL (ref <500)
Benzodiazepines: NEGATIVE ng/mL (ref <100)
Buprenorphine, Urine: NEGATIVE ng/mL (ref <5)
Cocaine Metabolite: NEGATIVE ng/mL (ref <150)
Creatinine: 111.6 mg/dL
MDMA: NEGATIVE ng/mL (ref <500)
Marijuana Metabolite: NEGATIVE ng/mL (ref <20)
Opiates: NEGATIVE ng/mL (ref <100)
Oxidant: NEGATIVE ug/mL
Oxycodone: NEGATIVE ng/mL (ref <100)
pH: 5.4 (ref 4.5–9.0)

## 2020-06-16 LAB — DM TEMPLATE

## 2020-06-16 NOTE — Assessment & Plan Note (Signed)
Encouraged heart healthy diet, increase exercise, avoid trans fats, consider a krill oil cap daily. Tolerating Atorvastatin 

## 2020-06-16 NOTE — Assessment & Plan Note (Signed)
Encouraged moist heat and gentle stretching as tolerated. May try NSAIDs and prescription meds as directed and report if symptoms worsen or seek immediate care,uses Hydrocodone sparingly prn when he has severe pain. Contract and UDS updated today and refill given.

## 2020-06-16 NOTE — Progress Notes (Signed)
Subjective:    Patient ID: Daniel Gates, male    DOB: [birth year], 74 y.o.   MRN: [id number]  Chief Complaint  Patient presents with  . Follow-up    HPI Patient is in today for follow up on chronic medical concerns. No recent febrile illness or hospitalizations. He reports he is trying to eat well and to stay active. No acute concerns. 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 Denies CP/palp/SOB/HA/congestion/fevers/GI or GU c/o. Taking meds as prescribed  Past Medical History:  Diagnosis Date  . Allergic rhinitis   . Anxiety 2003   "after cancer surgery"  . Asthma    childhood per pt.  . Bradycardia 10/01/2016  . Cancer ([place])    colon; diagnosed 2003  . Cataract 12/29/2016   not sure which eye  . Chronic renal insufficiency 06/07/2017  . Depression   . Diabetes type 2, controlled ([place]) 06/27/2012  . Glaucoma   . Gout   . Hyperlipidemia   . Hypertension   . Medicare annual wellness visit, subsequent 02/15/2014  . Open-angle glaucoma 12/29/2016  . Sickle cell anemia ([place]) 06/12/2017   "they say I have a trace"  . TMJ arthralgia 01/02/2016    Past Surgical History:  Procedure Laterality Date  . COLON SURGERY  2003   for colon cancer  . COLONOSCOPY    . INGUINAL HERNIA REPAIR  1997   right    Family History  Problem Relation Age of Onset  . Diabetes Mother   . Heart disease Mother        MI  . Cancer Father        prostate  . Prostate cancer Father   . Prostate cancer Brother   . Colon cancer Neg Hx   . Stomach cancer Neg Hx   . Esophageal cancer Neg Hx   . Rectal cancer Neg Hx     Social History   Socioeconomic History  . Marital status: Widowed    Spouse name: Not on file  . Number of children: 7  . Years of education: Not on file  . Highest education level: Not on file  Occupational History    Employer: DISABLED  Tobacco Use  . Smoking status: Never Smoker  . Smokeless tobacco: Never Used    Vaping Use  . Vaping Use: Never used  Substance and Sexual Activity  . Alcohol use: No  . Drug use: No  . Sexual activity: Yes  Other Topics Concern  . Not on file  Social History Narrative  . Not on file   Social Determinants of Health   Financial Resource Strain:   . Difficulty of Paying Living Expenses:   Food Insecurity:   . Worried About Charity fundraiser in the Last Year:   . Arboriculturist in the Last Year:   Transportation Needs:   . Film/video editor (Medical):   Marland Kitchen Lack of Transportation (Non-Medical):   Physical Activity:   . Days of Exercise per Week:   . Minutes of Exercise per Session:   Stress:   . Feeling of Stress :   Social Connections:   . Frequency of Communication with Friends and Family:   . Frequency of Social Gatherings with Friends and Family:   . Attends Religious Services:   . Active Member of Clubs or Organizations:   . Attends Archivist Meetings:   Marland Kitchen Marital Status:   Intimate Partner Violence:   .  Fear of Current or Ex-Partner:   . Emotionally Abused:   Marland Kitchen Physically Abused:   . Sexually Abused:     Outpatient Medications Prior to Visit  Medication Sig Dispense Refill  . allopurinol (ZYLOPRIM) 100 MG tablet TAKE 1 TABLET BY MOUTH EVERY DAY 30 tablet 5  . aspirin 325 MG tablet Take 325 mg by mouth daily.     Marland Kitchen atorvastatin (LIPITOR) 40 MG tablet Take 1 tablet (40 mg total) by mouth at bedtime. 30 tablet 3  . COD LIVER OIL PO Take 1 tablet by mouth daily.     . colchicine 0.6 MG tablet 2 tabs po once then 1 tab po q 2 hours prn pain til pain gone, max of 6 tabs in 24 hours or intolerable diarrhea 6 tablet 1  . dicyclomine (BENTYL) 20 MG tablet TAKE 1 TABLET(20 MG) BY MOUTH TWICE DAILY 180 tablet 1  . escitalopram (LEXAPRO) 10 MG tablet TAKE 1 TABLET BY MOUTH DAILY 30 tablet 5  . fenofibrate (TRICOR) 145 MG tablet TAKE 1 TABLET(130 MG) BY MOUTH DAILY BEFORE BREAKFAST 90 tablet 3  . Garlic Oil 7353 MG CAPS Take 1 capsule  by mouth daily.     . hydrochlorothiazide (HYDRODIURIL) 25 MG tablet TAKE 1 TABLET(25 MG) BY MOUTH DAILY 90 tablet 1  . Lancets (ONETOUCH DELICA PLUS [id number]) MISC USE TO TEST BLOOD SUGAR TWICE DAILY AS DIRECTED 100 each 1  . latanoprost (XALATAN) 0.005 % ophthalmic solution Place 1 drop into both eyes nightly.    . metFORMIN (GLUCOPHAGE) 500 MG tablet TAKE 1 TABLET(500 MG) BY MOUTH TWICE DAILY WITH A MEAL (Patient taking differently: Take 500 mg by mouth daily with breakfast. ) 180 tablet 0  . metoprolol succinate (TOPROL-XL) 25 MG 24 hr tablet TAKE 1 TABLET(25 MG) BY MOUTH DAILY 90 tablet 2  . Multiple Vitamins-Minerals (ONE-A-DAY MENS HEALTH FORMULA) TABS Take 1 tablet by mouth daily.    Glory Rosebush ULTRA test strip USE AS DIRECTED TO CHECK BLOOD SUGAR TWICE DAILY 100 strip 12  . promethazine (PHENERGAN) 25 MG tablet TAKE ONE (1) TABLET(S) EVERY FOUR (4) HOURS AS NEEDED FOR NAUSEA 60 tablet 0  . rOPINIRole (REQUIP) 1 MG tablet Take 1-2 tablets (1-2 mg total) by mouth at bedtime. 60 tablet 1  . tamsulosin (FLOMAX) 0.4 MG CAPS capsule TAKE 1 CAPSULE(0.4 MG) BY MOUTH DAILY AFTER SUPPER 90 capsule 1  . triamcinolone cream (KENALOG) 0.1 % Apply 1 application topically 2 (two) times daily. 30 g 0  . HYDROcodone-acetaminophen (NORCO) 10-325 MG tablet Take 1 tablet by mouth 2 (two) times daily as needed. 30 tablet 0   No facility-administered medications prior to visit.    No Known Allergies  Review of Systems  Constitutional: Negative for fever and malaise/fatigue.  HENT: Negative for congestion.   Eyes: Negative for blurred vision.  Respiratory: Negative for shortness of breath.   Cardiovascular: Negative for chest pain, palpitations and leg swelling.  Gastrointestinal: Negative for abdominal pain, blood in stool and nausea.  Genitourinary: Negative for dysuria and frequency.  Musculoskeletal: Negative for falls.  Skin: Negative for rash.  Neurological: Negative for dizziness, loss of  consciousness and headaches.  Endo/Heme/Allergies: Negative for environmental allergies.  Psychiatric/Behavioral: Negative for depression. The patient is not nervous/anxious.        Objective:    Physical Exam Vitals and nursing note reviewed.  Constitutional:      General: He is not in acute distress.    Appearance: He is well-developed.  HENT:  Head: Normocephalic and atraumatic.     Nose: Nose normal.  Eyes:     General:        Right eye: No discharge.        Left eye: No discharge.  Cardiovascular:     Rate and Rhythm: Normal rate and regular rhythm.     Heart sounds: No murmur heard.   Pulmonary:     Effort: Pulmonary effort is normal.     Breath sounds: Normal breath sounds.  Abdominal:     General: Bowel sounds are normal.     Palpations: Abdomen is soft.     Tenderness: There is no abdominal tenderness.  Musculoskeletal:     Cervical back: Normal range of motion and neck supple.  Skin:    General: Skin is warm and dry.  Neurological:     Mental Status: He is alert and oriented to person, place, and time.     BP 102/66 (BP Location: Right Arm, Cuff Size: Normal)   Pulse 62   Temp 98.5 F (36.9 C) (Temporal)   Resp 12   Ht 5\' 11"  (1.803 m)   Wt 200 lb (90.7 kg)   SpO2 95%   BMI 27.89 kg/m  Wt Readings from Last 3 Encounters:  06/15/20 200 lb (90.7 kg)  05/07/20 204 lb (92.5 kg)  01/27/20 218 lb 12.8 oz (99.2 kg)    Diabetic Foot Exam - Simple   No data filed     Lab Results  Component Value Date   WBC 6.5 06/15/2020   HGB 13.5 06/15/2020   HCT 42.0 06/15/2020   PLT 256.0 06/15/2020   GLUCOSE 109 (H) 06/15/2020   CHOL 146 06/15/2020   TRIG 123.0 06/15/2020   HDL 38.00 (L) 06/15/2020   LDLDIRECT 80.0 01/13/2020   LDLCALC 84 06/15/2020   ALT 22 06/15/2020   AST 24 06/15/2020   NA 138 06/15/2020   K 4.0 06/15/2020   CL 102 06/15/2020   CREATININE 1.76 (H) 06/15/2020   BUN 25 (H) 06/15/2020   CO2 28 06/15/2020   TSH 3.10 06/15/2020     PSA 9.10 (H) 02/05/2018   HGBA1C 6.0 06/15/2020   MICROALBUR <0.7 02/05/2018    Lab Results  Component Value Date   TSH 3.10 06/15/2020   Lab Results  Component Value Date   WBC 6.5 06/15/2020   HGB 13.5 06/15/2020   HCT 42.0 06/15/2020   MCV 79.2 06/15/2020   PLT 256.0 06/15/2020   Lab Results  Component Value Date   NA 138 06/15/2020   K 4.0 06/15/2020   CO2 28 06/15/2020   GLUCOSE 109 (H) 06/15/2020   BUN 25 (H) 06/15/2020   CREATININE 1.76 (H) 06/15/2020   BILITOT 0.4 06/15/2020   ALKPHOS 39 06/15/2020   AST 24 06/15/2020   ALT 22 06/15/2020   PROT 7.9 06/15/2020   ALBUMIN 4.2 06/15/2020   CALCIUM 9.9 06/15/2020   GFR 45.85 (L) 06/15/2020   Lab Results  Component Value Date   CHOL 146 06/15/2020   Lab Results  Component Value Date   HDL 38.00 (L) 06/15/2020   Lab Results  Component Value Date   LDLCALC 84 06/15/2020   Lab Results  Component Value Date   TRIG 123.0 06/15/2020   Lab Results  Component Value Date   CHOLHDL 4 06/15/2020   Lab Results  Component Value Date   HGBA1C 6.0 06/15/2020       Assessment & Plan:   Problem List Items Addressed This  Visit    Hypothyroidism    On Levothyroxine, continue to monitor      Relevant Orders   TSH (Completed)   Vitamin D deficiency    Supplement and monitor      Relevant Orders   VITAMIN D 25 Hydroxy (Vit-D Deficiency, Fractures) (Completed)   Hyperlipidemia    Encouraged heart healthy diet, increase exercise, avoid trans fats, consider a krill oil cap daily. Tolerating Atorvastatin      Relevant Orders   Lipid panel (Completed)   Essential hypertension - Primary    Well controlled, no changes to meds. Encouraged heart healthy diet such as the DASH diet and exercise as tolerated.       Relevant Orders   CBC (Completed)   Comprehensive metabolic panel (Completed)   LOW BACK PAIN, CHRONIC    Encouraged moist heat and gentle stretching as tolerated. May try NSAIDs and prescription  meds as directed and report if symptoms worsen or seek immediate care,uses Hydrocodone sparingly prn when he has severe pain. Contract and UDS updated today and refill given.       Relevant Medications   HYDROcodone-acetaminophen (NORCO) 10-325 MG tablet   Gout   Relevant Orders   Uric acid (Completed)   Left shoulder pain   Relevant Orders   DRUG MONITORING, PANEL 8 WITH CONFIRMATION, URINE   Non-insulin treated type 2 diabetes mellitus ([place])   Relevant Orders   Hemoglobin A1c (Completed)    Other Visit Diagnoses    High risk medication use       Relevant Orders   DRUG MONITORING, PANEL 8 WITH CONFIRMATION, URINE      I am having Niquan L. Johnnye Sima maintain his aspirin, Garlic Oil, COD LIVER OIL PO, promethazine, One-A-Day Mens Health Formula, latanoprost, metFORMIN, tamsulosin, OneTouch Ultra, triamcinolone cream, colchicine, atorvastatin, metoprolol succinate, hydrochlorothiazide, allopurinol, fenofibrate, escitalopram, dicyclomine, OneTouch Delica Plus [id number], rOPINIRole, and HYDROcodone-acetaminophen.  Meds ordered this encounter  Medications  . HYDROcodone-acetaminophen (NORCO) 10-325 MG tablet    Sig: Take 1 tablet by mouth 2 (two) times daily as needed.    Dispense:  30 tablet    Refill:  0     Penni Homans, MD

## 2020-06-16 NOTE — Assessment & Plan Note (Signed)
On Levothyroxine, continue to monitor 

## 2020-06-16 NOTE — Assessment & Plan Note (Signed)
Supplement and monitor 

## 2020-06-16 NOTE — Assessment & Plan Note (Signed)
Well controlled, no changes to meds. Encouraged heart healthy diet such as the DASH diet and exercise as tolerated.  °

## 2020-06-20 ENCOUNTER — Other Ambulatory Visit: Payer: Self-pay | Admitting: Family Medicine

## 2020-06-23 ENCOUNTER — Encounter: Payer: Self-pay | Admitting: *Deleted

## 2020-06-23 ENCOUNTER — Other Ambulatory Visit: Payer: Self-pay | Admitting: *Deleted

## 2020-06-23 DIAGNOSIS — N289 Disorder of kidney and ureter, unspecified: Secondary | ICD-10-CM

## 2020-06-23 MED ORDER — HYDROCHLOROTHIAZIDE 12.5 MG PO CAPS: 12.5000 mg | ORAL_CAPSULE | Freq: Every day | ORAL | 2 refills | Status: DC

## 2020-06-29 NOTE — Progress Notes (Signed)
Subjective:   Daniel Gates is a 74 y.o. male who presents for Medicare Annual/Subsequent preventive examination.  Review of Systems    Cardiac Risk Factors include: advanced age (>57men, >39 women);diabetes mellitus;hypertension;male gender;dyslipidemia     Objective:    Today's Vitals   07/01/20 1121  BP: (!) 138/78  Pulse: 61  Temp: (!) 97 F (36.1 C)  TempSrc: Temporal  SpO2: 97%  Weight: 196 lb 9.6 oz (89.2 kg)  Height: 5\' 11"  (1.803 m)   Body mass index is 27.42 kg/m.  Advanced Directives 07/01/2020 01/27/2020 06/30/2019 06/27/2018 08/07/2017 06/26/2017 06/12/2017  Does Patient Have a Medical Advance Directive? No No No No No No No  Does patient want to make changes to medical advance directive? - - - Yes (MAU/Ambulatory/Procedural Areas - Information given) - - -  Would patient like information on creating a medical advance directive? No - Patient declined No - Patient declined No - Patient declined - No - Patient declined Yes (MAU/Ambulatory/Procedural Areas - Information given) -    Current Medications (verified) Outpatient Encounter Medications as of 07/01/2020  Medication Sig  . allopurinol (ZYLOPRIM) 100 MG tablet TAKE 1 TABLET BY MOUTH EVERY DAY  . aspirin 325 MG tablet Take 325 mg by mouth daily.   Marland Kitchen atorvastatin (LIPITOR) 40 MG tablet Take 1 tablet (40 mg total) by mouth at bedtime.  . COD LIVER OIL PO Take 1 tablet by mouth daily.   Marland Kitchen dicyclomine (BENTYL) 20 MG tablet TAKE 1 TABLET(20 MG) BY MOUTH TWICE DAILY  . escitalopram (LEXAPRO) 10 MG tablet TAKE 1 TABLET BY MOUTH DAILY  . fenofibrate (TRICOR) 145 MG tablet TAKE 1 TABLET(130 MG) BY MOUTH DAILY BEFORE BREAKFAST  . Garlic Oil 5852 MG CAPS Take 1 capsule by mouth daily.   . hydrochlorothiazide (MICROZIDE) 12.5 MG capsule Take 1 capsule (12.5 mg total) by mouth daily.  Marland Kitchen HYDROcodone-acetaminophen (NORCO) 10-325 MG tablet Take 1 tablet by mouth 2 (two) times daily as needed.  . Lancets (ONETOUCH DELICA PLUS  [id number]) MISC USE TO TEST BLOOD SUGAR TWICE DAILY AS DIRECTED  . latanoprost (XALATAN) 0.005 % ophthalmic solution Place 1 drop into both eyes nightly.  . metFORMIN (GLUCOPHAGE) 500 MG tablet TAKE 1 TABLET(500 MG) BY MOUTH TWICE DAILY WITH A MEAL (Patient taking differently: Take 500 mg by mouth daily with breakfast. )  . metoprolol succinate (TOPROL-XL) 25 MG 24 hr tablet TAKE 1 TABLET(25 MG) BY MOUTH DAILY  . Multiple Vitamins-Minerals (ONE-A-DAY MENS HEALTH FORMULA) TABS Take 1 tablet by mouth daily.  Glory Rosebush ULTRA test strip USE AS DIRECTED TO CHECK BLOOD SUGAR TWICE DAILY  . rOPINIRole (REQUIP) 1 MG tablet Take 1-2 tablets (1-2 mg total) by mouth at bedtime.  . tamsulosin (FLOMAX) 0.4 MG CAPS capsule TAKE 1 CAPSULE(0.4 MG) BY MOUTH DAILY AFTER SUPPER  . triamcinolone cream (KENALOG) 0.1 % Apply 1 application topically 2 (two) times daily.  . colchicine 0.6 MG tablet 2 tabs po once then 1 tab po q 2 hours prn pain til pain gone, max of 6 tabs in 24 hours or intolerable diarrhea (Patient not taking: Reported on 07/01/2020)  . promethazine (PHENERGAN) 25 MG tablet TAKE ONE (1) TABLET(S) EVERY FOUR (4) HOURS AS NEEDED FOR NAUSEA (Patient not taking: Reported on 07/01/2020)   No facility-administered encounter medications on file as of 07/01/2020.    Allergies (verified) Patient has no known allergies.   History: Past Medical History:  Diagnosis Date  . Allergic rhinitis   . Anxiety  2003   "after cancer surgery"  . Asthma    childhood per pt.  . Bradycardia 10/01/2016  . Cancer ([place])    colon; diagnosed 2003  . Cataract 12/29/2016   not sure which eye  . Chronic renal insufficiency 06/07/2017  . Depression   . Diabetes type 2, controlled ([place]) 06/27/2012  . Glaucoma   . Gout   . Hyperlipidemia   . Hypertension   . Medicare annual wellness visit, subsequent 02/15/2014  . Open-angle glaucoma 12/29/2016  . Sickle cell anemia ([place]) 06/12/2017   "they say I have a trace"  . TMJ  arthralgia 01/02/2016   Past Surgical History:  Procedure Laterality Date  . COLON SURGERY  2003   for colon cancer  . COLONOSCOPY    . INGUINAL HERNIA REPAIR  1997   right   Family History  Problem Relation Age of Onset  . Diabetes Mother   . Heart disease Mother        MI  . Cancer Father        prostate  . Prostate cancer Father   . Prostate cancer Brother   . Colon cancer Neg Hx   . Stomach cancer Neg Hx   . Esophageal cancer Neg Hx   . Rectal cancer Neg Hx    Social History   Socioeconomic History  . Marital status: Widowed    Spouse name: Not on file  . Number of children: 7  . Years of education: Not on file  . Highest education level: Not on file  Occupational History    Employer: DISABLED  Tobacco Use  . Smoking status: Never Smoker  . Smokeless tobacco: Never Used  Vaping Use  . Vaping Use: Never used  Substance and Sexual Activity  . Alcohol use: No  . Drug use: No  . Sexual activity: Yes  Other Topics Concern  . Not on file  Social History Narrative  . Not on file   Social Determinants of Health   Financial Resource Strain: Low Risk   . Difficulty of Paying Living Expenses: Not hard at all  Food Insecurity: No Food Insecurity  . Worried About Charity fundraiser in the Last Year: Never true  . Ran Out of Food in the Last Year: Never true  Transportation Needs: No Transportation Needs  . Lack of Transportation (Medical): No  . Lack of Transportation (Non-Medical): No  Physical Activity:   . Days of Exercise per Week:   . Minutes of Exercise per Session:   Stress:   . Feeling of Stress :   Social Connections:   . Frequency of Communication with Friends and Family:   . Frequency of Social Gatherings with Friends and Family:   . Attends Religious Services:   . Active Member of Clubs or Organizations:   . Attends Archivist Meetings:   Marland Kitchen Marital Status:     Tobacco Counseling Counseling given: Not Answered   Clinical  Intake: Pain : No/denies pain    Activities of Daily Living In your present state of health, do you have any difficulty performing the following activities: 07/01/2020  Hearing? N  Vision? N  Difficulty concentrating or making decisions? N  Walking or climbing stairs? N  Dressing or bathing? N  Doing errands, shopping? N  Preparing Food and eating ? N  Using the Toilet? N  In the past six months, have you accidently leaked urine? N  Do you have problems with loss of bowel control? N  Managing your Medications? N  Managing your Finances? N  Housekeeping or managing your Housekeeping? N  Some recent data might be hidden    Patient Care Team: [name], MD as PCP - General (Family Medicine) Linward Natal, MD as Consulting Physician (Ophthalmology)  Indicate any recent Medical Services you may have received from other than [facility] providers in the past year (date may be approximate).     Assessment:   This is a routine wellness examination for K[place]er.  Dietary issues and exercise activities discussed: Current Exercise Habits: The patient does not participate in regular exercise at present, Exercise limited by: None identified Diet (meal preparation, eat out, water intake, caffeinated beverages, dairy products, fruits and vegetables): well balanced   Goals    . Decrease soda or juice intake    . Eat more fruits and vegetables    . Increase physical activity     Walk at least 3 times per week       Depression Screen PHQ 2/9 Scores 07/01/2020 06/30/2019 06/27/2018 06/26/2017 06/23/2016 12/24/2015 02/15/2014  PHQ - 2 Score 0 0 0 0 2 0 0  PHQ- 9 Score - - - - 4 - -    Fall Risk Fall Risk  07/01/2020 06/30/2019 06/27/2018 06/26/2017 06/23/2016  Falls in the past year? 1 0 No No No  Comment tripped while mowing the yard - - - -  Number falls in past yr: 0 - - - -  Injury with Fall? 0 - - - -  Follow up Education provided;Falls prevention discussed - - - -   Lives w/ son in 1  story home. Any stairs in or around the home? Yes  2 steps If so, are there any without handrails? No  Home free of loose throw rugs in walkways, pet beds, electrical cords, etc? Yes  Adequate lighting in your home to reduce risk of falls? Yes   ASSISTIVE DEVICES UTILIZED TO PREVENT FALLS: None needed per pt.   TIMED UP AND GO:  Was the test performed? No .   Gait steady and fast without use of assistive device  Cognitive Function:   MMSE - Mini Mental State Exam 06/27/2018 06/26/2017 06/23/2016  Orientation to time 5 5 5   Orientation to Place 5 5 5   Registration 3 3 3   Attention/ Calculation 5 5 5   Recall 2 2 2   Language- name 2 objects 2 2 2   Language- repeat 1 1 1   Language- follow 3 step command 3 3 3   Language- read & follow direction 1 1 1   Write a sentence 1 1 1   Copy design 1 1 1   Total score 29 29 29      6CIT Screen 07/01/2020  What Year? 0 points  What month? 0 points  What time? 0 points  Count back from 20 0 points  Months in reverse 2 points  Repeat phrase 0 points  Total Score 2    Immunizations Immunization History  Administered Date(s) Administered  . PFIZER SARS-COV-2 Vaccination 02/01/2020, 03/10/2020  . Td 12/04/1997, 07/14/2019  . Tdap 01/30/2012    TDAP status: Up to date Pneumococcal vaccine status: Declined,  Education has been provided regarding the importance of this vaccine but patient still declined. Advised may receive this vaccine at local pharmacy or Health Dept. Aware to provide a copy of the vaccination record if obtained from local pharmacy or Health Dept. Verbalized acceptance and understanding.  Covid-19 vaccine status: Completed vaccines   Screening Tests Health Maintenance  Topic Date Due  . PNA vac Low Risk Adult (2 of 2 - PPSV23) 12/23/2016  . FOOT EXAM  05/18/2017  . OPHTHALMOLOGY EXAM  03/27/2018  . URINE MICROALBUMIN  02/06/2019  . HEMOGLOBIN A1C  12/16/2020  . COLONOSCOPY  06/12/2022  . TETANUS/TDAP  07/13/2029  .  COVID-19 Vaccine  Completed  . Hepatitis C Screening  Completed  . INFLUENZA VACCINE  Discontinued    Health Maintenance  Health Maintenance Due  Topic Date Due  . PNA vac Low Risk Adult (2 of 2 - PPSV23) 12/23/2016  . FOOT EXAM  05/18/2017  . OPHTHALMOLOGY EXAM  03/27/2018  . URINE MICROALBUMIN  02/06/2019    Colorectal cancer screening: Completed 06/12/17. Repeat every 5 years  Lung Cancer Screening: (Low Dose CT Chest recommended if Age 72-80 years, 30 pack-year currently smoking OR have quit w/in 15years.) does not qualify.     Additional Screening:  Hepatitis C Screening: does qualify; Completed 12/24/15  Vision Screening: Recommended annual ophthalmology exams for early detection of glaucoma and other disorders of the eye. Is the patient up to date with their annual eye exam?  Yes  per pt  Dental Screening: Recommended annual dental exams for proper oral hygiene  Community Resource Referral / Chronic Care Management: CRR required this visit?  No   CCM required this visit?  No      Plan:   See you next year!  Continue to eat heart healthy diet (full of fruits, vegetables, whole grains, lean protein, water--limit salt, fat, and sugar intake) and increase physical activity as tolerated.  Continue doing brain stimulating activities (puzzles, reading, adult coloring books, staying active) to keep memory sharp.     I have personally reviewed and noted the following in the patient's chart:   . Medical and social history . Use of alcohol, tobacco or illicit drugs  . Current medications and supplements . Functional ability and status . Nutritional status . Physical activity . Advanced directives . List of other physicians . Hospitalizations, surgeries, and ER visits in previous 12 months . Vitals . Screenings to include cognitive, depression, and falls . Referrals and appointments  In addition, I have reviewed and discussed with patient certain preventive  protocols, quality metrics, and best practice recommendations. A written personalized care plan for preventive services as well as general preventive health recommendations were provided to patient.     Shela Nevin, [place]   07/01/2020   Nurse Notes: Still mows his yard and works around the house.

## 2020-07-01 ENCOUNTER — Other Ambulatory Visit: Payer: Self-pay

## 2020-07-01 ENCOUNTER — Ambulatory Visit (INDEPENDENT_AMBULATORY_CARE_PROVIDER_SITE_OTHER): Payer: Medicare Other | Admitting: *Deleted

## 2020-07-01 ENCOUNTER — Encounter: Payer: Self-pay | Admitting: *Deleted

## 2020-07-01 VITALS — BP 138/78 | HR 61 | Temp 97.0°F | Ht 71.0 in | Wt 196.6 lb

## 2020-07-01 DIAGNOSIS — Z0000 Encounter for general adult medical examination without abnormal findings: Secondary | ICD-10-CM

## 2020-07-01 NOTE — Patient Instructions (Signed)
See you next year!  Continue to eat heart healthy diet (full of fruits, vegetables, whole grains, lean protein, water--limit salt, fat, and sugar intake) and increase physical activity as tolerated.  Continue doing brain stimulating activities (puzzles, reading, adult coloring books, staying active) to keep memory sharp.    Daniel Gates , Thank you for taking time to come for your Medicare Wellness Visit. I appreciate your ongoing commitment to your health goals. Please review the following plan we discussed and let me know if I can assist you in the future.   These are the goals we discussed: Goals    . Decrease soda or juice intake    . Eat more fruits and vegetables    . Increase physical activity     Walk at least 3 times per week        This is a list of the screening recommended for you and due dates:  Health Maintenance  Topic Date Due  . Pneumonia vaccines (2 of 2 - PPSV23) 12/23/2016  . Complete foot exam   05/18/2017  . Eye exam for diabetics  03/27/2018  . Urine Protein Check  02/06/2019  . Hemoglobin A1C  12/16/2020  . Colon Cancer Screening  06/12/2022  . Tetanus Vaccine  07/13/2029  . COVID-19 Vaccine  Completed  .  Hepatitis C: One time screening is recommended by Center for Disease Control  (CDC) for  adults born from [birth year] through 1966.   Completed  . Flu Shot  Discontinued    Preventive Care 25 Years and Older, Male Preventive care refers to lifestyle choices and visits with your health care provider that can promote health and wellness. This includes:  A yearly physical exam. This is also called an annual well check.  Regular dental and eye exams.  Immunizations.  Screening for certain conditions.  Healthy lifestyle choices, such as diet and exercise. What can I expect for my preventive care visit? Physical exam Your health care provider will check:  Height and weight. These may be used to calculate body mass index (BMI), which is a measurement  that tells if you are at a healthy weight.  Heart rate and blood pressure.  Your skin for abnormal spots. Counseling Your health care provider may ask you questions about:  Alcohol, tobacco, and drug use.  Emotional well-being.  Home and relationship well-being.  Sexual activity.  Eating habits.  History of falls.  Memory and ability to understand (cognition).  Work and work Statistician. What immunizations do I need?  Influenza (flu) vaccine  This is recommended every year. Tetanus, diphtheria, and pertussis (Tdap) vaccine  You may need a Td booster every 10 years. Varicella (chickenpox) vaccine  You may need this vaccine if you have not already been vaccinated. Zoster (shingles) vaccine  You may need this after age 4. Pneumococcal conjugate (PCV13) vaccine  One dose is recommended after age 44. Pneumococcal polysaccharide (PPSV23) vaccine  One dose is recommended after age 71. Measles, mumps, and rubella (MMR) vaccine  You may need at least one dose of MMR if you were born in 1957 or later. You may also need a second dose. Meningococcal conjugate (MenACWY) vaccine  You may need this if you have certain conditions. Hepatitis A vaccine  You may need this if you have certain conditions or if you travel or work in places where you may be exposed to hepatitis A. Hepatitis B vaccine  You may need this if you have certain conditions or if you travel  or work in places where you may be exposed to hepatitis B. Haemophilus influenzae type b (Hib) vaccine  You may need this if you have certain conditions. You may receive vaccines as individual doses or as more than one vaccine together in one shot (combination vaccines). Talk with your health care provider about the risks and benefits of combination vaccines. What tests do I need? Blood tests  Lipid and cholesterol levels. These may be checked every 5 years, or more frequently depending on your overall  health.  Hepatitis C test.  Hepatitis B test. Screening  Lung cancer screening. You may have this screening every year starting at age 39 if you have a 30-pack-year history of smoking and currently smoke or have quit within the past 15 years.  Colorectal cancer screening. All adults should have this screening starting at age 24 and continuing until age 36. Your health care provider may recommend screening at age 24 if you are at increased risk. You will have tests every 1-10 years, depending on your results and the type of screening test.  Prostate cancer screening. Recommendations will vary depending on your family history and other risks.  Diabetes screening. This is done by checking your blood sugar (glucose) after you have not eaten for a while (fasting). You may have this done every 1-3 years.  Abdominal aortic aneurysm (AAA) screening. You may need this if you are a current or former smoker.  Sexually transmitted disease (STD) testing. Follow these instructions at home: Eating and drinking  Eat a diet that includes fresh fruits and vegetables, whole grains, lean protein, and low-fat dairy products. Limit your intake of foods with high amounts of sugar, saturated fats, and salt.  Take vitamin and mineral supplements as recommended by your health care provider.  Do not drink alcohol if your health care provider tells you not to drink.  If you drink alcohol: ? Limit how much you have to 0-2 drinks a day. ? Be aware of how much alcohol is in your drink. In the [place] one drink equals one 12 oz bottle of beer (355 mL), one 5 oz glass of wine (148 mL), or one 1 oz glass of hard liquor (44 mL). Lifestyle  Take daily care of your teeth and gums.  Stay active. Exercise for at least 30 minutes on 5 or more days each week.  Do not use any products that contain nicotine or tobacco, such as cigarettes, e-cigarettes, and chewing tobacco. If you need help quitting, ask your health care  provider.  If you are sexually active, practice safe sex. Use a condom or other form of protection to prevent STIs (sexually transmitted infections).  Talk with your health care provider about taking a low-dose aspirin or statin. What's next?  Visit your health care provider once a year for a well check visit.  Ask your health care provider how often you should have your eyes and teeth checked.  Stay up to date on all vaccines. This information is not intended to replace advice given to you by your health care provider. Make sure you discuss any questions you have with your health care provider. Document Revised: 11/14/2018 Document Reviewed: 11/14/2018 Elsevier Patient Education  2020 [employer].

## 2020-07-27 ENCOUNTER — Other Ambulatory Visit: Payer: Medicare Other

## 2020-07-27 ENCOUNTER — Ambulatory Visit (INDEPENDENT_AMBULATORY_CARE_PROVIDER_SITE_OTHER): Payer: Medicare Other | Admitting: Family Medicine

## 2020-07-27 ENCOUNTER — Other Ambulatory Visit: Payer: Self-pay

## 2020-07-27 DIAGNOSIS — N289 Disorder of kidney and ureter, unspecified: Secondary | ICD-10-CM

## 2020-07-27 DIAGNOSIS — I10 Essential (primary) hypertension: Secondary | ICD-10-CM | POA: Diagnosis not present

## 2020-07-27 LAB — COMPREHENSIVE METABOLIC PANEL
AG Ratio: 1.1 (calc) (ref 1.0–2.5)
ALT: 20 U/L (ref 9–46)
AST: 23 U/L (ref 10–35)
Albumin: 4 g/dL (ref 3.6–5.1)
Alkaline phosphatase (APISO): 33 U/L — ABNORMAL LOW (ref 35–144)
BUN/Creatinine Ratio: 11 (calc) (ref 6–22)
BUN: 19 mg/dL (ref 7–25)
CO2: 24 mmol/L (ref 20–32)
Calcium: 9.7 mg/dL (ref 8.6–10.3)
Chloride: 104 mmol/L (ref 98–110)
Creat: 1.71 mg/dL — ABNORMAL HIGH (ref 0.70–1.18)
Globulin: 3.6 g/dL (calc) (ref 1.9–3.7)
Glucose, Bld: 102 mg/dL — ABNORMAL HIGH (ref 65–99)
Potassium: 4.5 mmol/L (ref 3.5–5.3)
Sodium: 137 mmol/L (ref 135–146)
Total Bilirubin: 0.5 mg/dL (ref 0.2–1.2)
Total Protein: 7.6 g/dL (ref 6.1–8.1)

## 2020-07-27 NOTE — Addendum Note (Signed)
Addended by: Angelina Pih on: 07/27/2020 08:58 AM   Modules accepted: Orders

## 2020-07-27 NOTE — Progress Notes (Signed)
Pt here for Blood pressure check per Dr. Charlett Blake   Pt currently takes: HCTZ  And Metopolol.    Pt reports compliance with medication.  BP today @ =  122/82 HR = 52  Pt advised per PCP no changes and follow-up as advised.

## 2020-07-29 ENCOUNTER — Telehealth: Payer: Self-pay | Admitting: Family Medicine

## 2020-07-29 ENCOUNTER — Other Ambulatory Visit: Payer: Self-pay | Admitting: Family Medicine

## 2020-07-29 NOTE — Telephone Encounter (Signed)
New Message:   Pt is calling back to get his lab results. Please advise.

## 2020-07-29 NOTE — Telephone Encounter (Signed)
Pt called and lvm to return call , please refer to lab results.

## 2020-08-02 NOTE — Progress Notes (Signed)
Pt. Notified of lab results.

## 2020-08-11 ENCOUNTER — Other Ambulatory Visit: Payer: Self-pay | Admitting: Family Medicine

## 2020-08-12 ENCOUNTER — Other Ambulatory Visit: Payer: Self-pay | Admitting: Family Medicine

## 2020-08-29 ENCOUNTER — Other Ambulatory Visit: Payer: Self-pay | Admitting: Family Medicine

## 2020-09-13 DIAGNOSIS — H401131 Primary open-angle glaucoma, bilateral, mild stage: Secondary | ICD-10-CM | POA: Diagnosis not present

## 2020-10-02 ENCOUNTER — Other Ambulatory Visit: Payer: Self-pay | Admitting: Family Medicine

## 2020-11-05 ENCOUNTER — Other Ambulatory Visit: Payer: Self-pay | Admitting: Family Medicine

## 2020-11-16 ENCOUNTER — Other Ambulatory Visit: Payer: Self-pay | Admitting: Family Medicine

## 2020-12-20 ENCOUNTER — Encounter: Payer: Self-pay | Admitting: Family Medicine

## 2020-12-20 ENCOUNTER — Telehealth (INDEPENDENT_AMBULATORY_CARE_PROVIDER_SITE_OTHER): Payer: Medicare HMO | Admitting: Family Medicine

## 2020-12-20 ENCOUNTER — Other Ambulatory Visit: Payer: Self-pay

## 2020-12-20 VITALS — Ht 71.0 in | Wt 210.0 lb

## 2020-12-20 DIAGNOSIS — E7849 Other hyperlipidemia: Secondary | ICD-10-CM | POA: Diagnosis not present

## 2020-12-20 DIAGNOSIS — E559 Vitamin D deficiency, unspecified: Secondary | ICD-10-CM

## 2020-12-20 DIAGNOSIS — M1A0710 Idiopathic chronic gout, right ankle and foot, without tophus (tophi): Secondary | ICD-10-CM | POA: Diagnosis not present

## 2020-12-20 DIAGNOSIS — R972 Elevated prostate specific antigen [PSA]: Secondary | ICD-10-CM

## 2020-12-20 DIAGNOSIS — E038 Other specified hypothyroidism: Secondary | ICD-10-CM

## 2020-12-20 DIAGNOSIS — E119 Type 2 diabetes mellitus without complications: Secondary | ICD-10-CM

## 2020-12-20 DIAGNOSIS — N189 Chronic kidney disease, unspecified: Secondary | ICD-10-CM

## 2020-12-20 DIAGNOSIS — I10 Essential (primary) hypertension: Secondary | ICD-10-CM | POA: Diagnosis not present

## 2020-12-20 DIAGNOSIS — N289 Disorder of kidney and ureter, unspecified: Secondary | ICD-10-CM

## 2020-12-20 NOTE — Progress Notes (Signed)
Virtual Visit via phone Note  I connected with Daniel Gates on 12/20/20 at  9:20 AM EST by a phone enabled telemedicine application and verified that I am speaking with the correct person using two identifiers.  Location: Patient: home, patient and provider are in visit Provider: home   I discussed the limitations of evaluation and management by telemedicine and the availability of in person appointments. The patient expressed understanding and agreed to proceed with visit K Canter CMA  was able to get the patient set up on a phone visit after being unable to set up a video visit      Subjective:    Patient ID: Daniel Gates, male    DOB: 11-18-1946, 75 y.o.   MRN: [id number]  Chief Complaint  Patient presents with  . Follow-up    No concerns    HPI Patient is in today for follow up on chronic medical concerns. No recent febrile illness or hospitalizations. He has been largely staying home during the pandemic. He has had the first 2 covid shots but has not gotten the third yet. Denies CP/palp/SOB/HA/congestion/fevers/GI or GU c/o. Taking meds as prescribed  Past Medical History:  Diagnosis Date  . Allergic rhinitis   . Anxiety 2003   "after cancer surgery"  . Asthma    childhood per pt.  . Bradycardia 10/01/2016  . Cancer ([place])    colon; diagnosed 2003  . Cataract 12/29/2016   not sure which eye  . Chronic renal insufficiency 06/07/2017  . Depression   . Diabetes type 2, controlled ([place]) 06/27/2012  . Glaucoma   . Gout   . Hyperlipidemia   . Hypertension   . Medicare annual wellness visit, subsequent 02/15/2014  . Open-angle glaucoma 12/29/2016  . Sickle cell anemia ([place]) 06/12/2017   "they say I have a trace"  . TMJ arthralgia 01/02/2016    Past Surgical History:  Procedure Laterality Date  . COLON SURGERY  2003   for colon cancer  . COLONOSCOPY    . INGUINAL HERNIA REPAIR  1997   right    Family History  Problem Relation Age of Onset  . Diabetes Mother    . Heart disease Mother        MI  . Cancer Father        prostate  . Prostate cancer Father   . Prostate cancer Brother   . Colon cancer Neg Hx   . Stomach cancer Neg Hx   . Esophageal cancer Neg Hx   . Rectal cancer Neg Hx     Social History   Socioeconomic History  . Marital status: Widowed    Spouse name: Not on file  . Number of children: 7  . Years of education: Not on file  . Highest education level: Not on file  Occupational History    Employer: DISABLED  Tobacco Use  . Smoking status: Never Smoker  . Smokeless tobacco: Never Used  Vaping Use  . Vaping Use: Never used  Substance and Sexual Activity  . Alcohol use: No  . Drug use: No  . Sexual activity: Yes  Other Topics Concern  . Not on file  Social History Narrative  . Not on file   Social Determinants of Health   Financial Resource Strain: Low Risk   . Difficulty of Paying Living Expenses: Not hard at all  Food Insecurity: No Food Insecurity  . Worried About Charity fundraiser in the Last Year: Never true  . Ran  Out of Food in the Last Year: Never true  Transportation Needs: No Transportation Needs  . Lack of Transportation (Medical): No  . Lack of Transportation (Non-Medical): No  Physical Activity: Not on file  Stress: Not on file  Social Connections: Not on file  Intimate Partner Violence: Not on file    Outpatient Medications Prior to Visit  Medication Sig Dispense Refill  . allopurinol (ZYLOPRIM) 100 MG tablet TAKE 1 TABLET BY MOUTH EVERY DAY 30 tablet 5  . aspirin 325 MG tablet Take 325 mg by mouth daily.    Marland Kitchen atorvastatin (LIPITOR) 40 MG tablet Take 1 tablet (40 mg total) by mouth at bedtime. 30 tablet 3  . COD LIVER OIL PO Take 1 tablet by mouth daily.    Marland Kitchen dicyclomine (BENTYL) 20 MG tablet TAKE 1 TABLET(20 MG) BY MOUTH TWICE DAILY 180 tablet 1  . escitalopram (LEXAPRO) 10 MG tablet Take 1 tablet (10 mg total) by mouth daily. 90 tablet 1  . fenofibrate (TRICOR) 145 MG tablet TAKE 1  TABLET(130 MG) BY MOUTH DAILY BEFORE BREAKFAST 90 tablet 3  . Garlic Oil 5784 MG CAPS Take 1 capsule by mouth daily.    . hydrochlorothiazide (MICROZIDE) 12.5 MG capsule TAKE 1 CAPSULE(12.5 MG) BY MOUTH DAILY. DISCONTINUE 25MG  DOSE 30 capsule 2  . HYDROcodone-acetaminophen (NORCO) 10-325 MG tablet Take 1 tablet by mouth 2 (two) times daily as needed. 30 tablet 0  . Lancets (ONETOUCH DELICA PLUS ONGEXB28U) MISC USE TO TEST BLOOD SUGAR TWICE DAILY AS DIRECTED 200 each 12  . latanoprost (XALATAN) 0.005 % ophthalmic solution Place 1 drop into both eyes nightly.    . metFORMIN (GLUCOPHAGE) 500 MG tablet TAKE 1 TABLET(500 MG) BY MOUTH TWICE DAILY WITH A MEAL (Patient taking differently: Take 500 mg by mouth daily with breakfast.) 180 tablet 0  . metoprolol succinate (TOPROL-XL) 25 MG 24 hr tablet TAKE 1 TABLET(25 MG) BY MOUTH DAILY 90 tablet 2  . Multiple Vitamins-Minerals (ONE-A-DAY MENS HEALTH FORMULA) TABS Take 1 tablet by mouth daily.    Glory Rosebush ULTRA test strip USE AS DIRECTED TO CHECK BLOOD SUGAR TWICE DAILY 100 strip 12  . promethazine (PHENERGAN) 25 MG tablet TAKE ONE (1) TABLET(S) EVERY FOUR (4) HOURS AS NEEDED FOR NAUSEA 60 tablet 0  . rOPINIRole (REQUIP) 1 MG tablet Take 1-2 tablets (1-2 mg total) by mouth at bedtime. 180 tablet 0  . tamsulosin (FLOMAX) 0.4 MG CAPS capsule TAKE 1 CAPSULE(0.4 MG) BY MOUTH DAILY AFTER SUPPER 90 capsule 1  . triamcinolone cream (KENALOG) 0.1 % Apply 1 application topically 2 (two) times daily. 30 g 0  . colchicine 0.6 MG tablet 2 tabs po once then 1 tab po q 2 hours prn pain til pain gone, max of 6 tabs in 24 hours or intolerable diarrhea (Patient not taking: No sig reported) 6 tablet 1   No facility-administered medications prior to visit.    No Known Allergies  Review of Systems  Constitutional: Negative for fever and malaise/fatigue.  HENT: Negative for congestion.   Eyes: Negative for blurred vision.  Respiratory: Negative for shortness of breath.    Cardiovascular: Negative for chest pain, palpitations and leg swelling.  Gastrointestinal: Negative for abdominal pain, blood in stool and nausea.  Genitourinary: Negative for dysuria and frequency.  Musculoskeletal: Negative for falls.  Skin: Negative for rash.  Neurological: Negative for dizziness, loss of consciousness and headaches.  Endo/Heme/Allergies: Negative for environmental allergies.  Psychiatric/Behavioral: Negative for depression. The patient is not nervous/anxious.  Objective:    Physical Exam unable to obtain via phone  Ht 5\' 11"  (1.803 m)   Wt 210 lb (95.3 kg)   BMI 29.29 kg/m  Wt Readings from Last 3 Encounters:  12/20/20 210 lb (95.3 kg)  07/01/20 196 lb 9.6 oz (89.2 kg)  06/15/20 200 lb (90.7 kg)    Diabetic Foot Exam - Simple   No data filed    Lab Results  Component Value Date   WBC 6.5 06/15/2020   HGB 13.5 06/15/2020   HCT 42.0 06/15/2020   PLT 256.0 06/15/2020   GLUCOSE 102 (H) 07/27/2020   CHOL 146 06/15/2020   TRIG 123.0 06/15/2020   HDL 38.00 (L) 06/15/2020   LDLDIRECT 80.0 01/13/2020   LDLCALC 84 06/15/2020   ALT 20 07/27/2020   AST 23 07/27/2020   NA 137 07/27/2020   K 4.5 07/27/2020   CL 104 07/27/2020   CREATININE 1.71 (H) 07/27/2020   BUN 19 07/27/2020   CO2 24 07/27/2020   TSH 3.10 06/15/2020   PSA 9.10 (H) 02/05/2018   HGBA1C 6.0 06/15/2020   MICROALBUR <0.7 02/05/2018    Lab Results  Component Value Date   TSH 3.10 06/15/2020   Lab Results  Component Value Date   WBC 6.5 06/15/2020   HGB 13.5 06/15/2020   HCT 42.0 06/15/2020   MCV 79.2 06/15/2020   PLT 256.0 06/15/2020   Lab Results  Component Value Date   NA 137 07/27/2020   K 4.5 07/27/2020   CO2 24 07/27/2020   GLUCOSE 102 (H) 07/27/2020   BUN 19 07/27/2020   CREATININE 1.71 (H) 07/27/2020   BILITOT 0.5 07/27/2020   ALKPHOS 39 06/15/2020   AST 23 07/27/2020   ALT 20 07/27/2020   PROT 7.6 07/27/2020   ALBUMIN 4.2 06/15/2020   CALCIUM 9.7  07/27/2020   GFR 45.85 (L) 06/15/2020   Lab Results  Component Value Date   CHOL 146 06/15/2020   Lab Results  Component Value Date   HDL 38.00 (L) 06/15/2020   Lab Results  Component Value Date   LDLCALC 84 06/15/2020   Lab Results  Component Value Date   TRIG 123.0 06/15/2020   Lab Results  Component Value Date   CHOLHDL 4 06/15/2020   Lab Results  Component Value Date   HGBA1C 6.0 06/15/2020       Assessment & Plan:   Problem List Items Addressed This Visit    Hypothyroidism - Primary   Relevant Orders   TSH   Vitamin D deficiency    Supplement and monitor      Relevant Orders   VITAMIN D 25 Hydroxy (Vit-D Deficiency, Fractures)   Hyperlipidemia    Encouraged heart healthy diet, increase exercise, avoid trans fats, consider a krill oil cap daily. Tolerating Atorvastatin      Relevant Orders   Lipid panel   Essential hypertension    Monitor and report any concerns. Asked to check his vitals weekly and as needed and report. no changes to meds. Encouraged heart healthy diet such as the DASH diet and exercise as tolerated.       Relevant Orders   CBC   Comprehensive metabolic panel   Gout    Hydrate and monitor      Relevant Orders   Uric acid   Elevated PSA    He denies symptoms and is following with urology in [place]> his urologist is retiring so he is encouraged to to call for appt.  Non-insulin treated type 2 diabetes mellitus (HCC)    hgba1c acceptable, minimize simple carbs. Increase exercise as tolerated. Continue current meds      Relevant Orders   Hemoglobin A1c   Chronic renal insufficiency    Hydrate and monitor         I am having Rickey L. Johnnye Sima maintain his aspirin, Garlic Oil, COD LIVER OIL PO, promethazine, One-A-Day Mens Health Formula, latanoprost, metFORMIN, tamsulosin, triamcinolone, colchicine, atorvastatin, fenofibrate, HYDROcodone-acetaminophen, metoprolol succinate, OneTouch Ultra, allopurinol, escitalopram,  dicyclomine, hydrochlorothiazide, rOPINIRole, and OneTouch Delica Plus 0000000.  No orders of the defined types were placed in this encounter.    I discussed the assessment and treatment plan with the patient. The patient was provided an opportunity to ask questions and all were answered. The patient agreed with the plan and demonstrated an understanding of the instructions.   The patient was advised to call back or seek an in-person evaluation if the symptoms worsen or if the condition fails to improve as anticipated.  I provided 25 minutes of non-face-to-face time during this encounter.   Penni Homans, MD

## 2020-12-20 NOTE — Assessment & Plan Note (Signed)
He denies symptoms and is following with urology in [place]> his urologist is retiring so he is encouraged to to call for appt.

## 2020-12-20 NOTE — Assessment & Plan Note (Signed)
Hydrate and monitor 

## 2020-12-20 NOTE — Assessment & Plan Note (Signed)
hgba1c acceptable, minimize simple carbs. Increase exercise as tolerated. Continue current meds 

## 2020-12-20 NOTE — Assessment & Plan Note (Deleted)
On Levothyroxine, continue to monitor 

## 2020-12-20 NOTE — Assessment & Plan Note (Signed)
Encouraged heart healthy diet, increase exercise, avoid trans fats, consider a krill oil cap daily. Tolerating Atorvastatin 

## 2020-12-20 NOTE — Assessment & Plan Note (Signed)
Monitor and report any concerns. Asked to check his vitals weekly and as needed and report. no changes to meds. Encouraged heart healthy diet such as the DASH diet and exercise as tolerated.

## 2020-12-20 NOTE — Assessment & Plan Note (Signed)
Supplement and monitor 

## 2020-12-28 ENCOUNTER — Ambulatory Visit: Payer: Medicare HMO | Attending: Internal Medicine

## 2020-12-28 ENCOUNTER — Telehealth: Payer: Self-pay | Admitting: *Deleted

## 2020-12-28 ENCOUNTER — Other Ambulatory Visit (HOSPITAL_BASED_OUTPATIENT_CLINIC_OR_DEPARTMENT_OTHER): Payer: Self-pay | Admitting: Internal Medicine

## 2020-12-28 DIAGNOSIS — Z23 Encounter for immunization: Secondary | ICD-10-CM

## 2020-12-28 MED ORDER — TRUE METRIX AIR GLUCOSE METER W/DEVICE KIT: PACK | 0 refills | Status: AC

## 2020-12-28 MED ORDER — TRUE METRIX BLOOD GLUCOSE TEST VI STRP: ORAL_STRIP | 1 refills | Status: DC

## 2020-12-28 MED ORDER — TRUEPLUS LANCETS 33G MISC: 1 refills | Status: AC

## 2020-12-28 MED FILL — PFIZER-BIONTECH COVID-19 VA: 30 | 21 days supply | Qty: 0 | Fill #0

## 2020-12-28 NOTE — Telephone Encounter (Signed)
Pharmacy sent over prior auth for test strips.  Spoke with patient and he has humana, so we will try the true metrix line, which they usually covered.  Rxs sent in.

## 2020-12-28 NOTE — Progress Notes (Signed)
   Covid-19 Vaccination Clinic  Name:  Daniel Gates    MRN: [id number] DOB: 01/28/1946  12/28/2020  [name] was observed post Covid-19 immunization for 15 minutes without incident. He was provided with Vaccine Information Sheet and instruction to access the V-Safe system.  Vaccinated By: Allayne Butcher [name] was instructed to call 911 with any severe reactions post vaccine: Marland Kitchen Difficulty breathing  . Swelling of face and throat  . A fast heartbeat  . A bad rash all over body  . Dizziness and weakness   Immunizations Administered    Name Date Dose VIS Date Route   Pfizer COVID-19 Vaccine 12/28/2020 11:41 AM 0.3 mL 09/22/2020 Intramuscular   Manufacturer: [place]   Lot: [id number]   [place]: [id number]

## 2021-01-14 DIAGNOSIS — H401131 Primary open-angle glaucoma, bilateral, mild stage: Secondary | ICD-10-CM | POA: Diagnosis not present

## 2021-01-14 LAB — HM DIABETES EYE EXAM

## 2021-01-31 ENCOUNTER — Other Ambulatory Visit: Payer: Self-pay | Admitting: Family Medicine

## 2021-02-01 ENCOUNTER — Telehealth: Payer: Self-pay | Admitting: Family Medicine

## 2021-02-01 NOTE — Telephone Encounter (Signed)
LVM AWV has been changed to 07/07/21@12 :40pm with Caroleen Hamman new NHA.

## 2021-02-16 ENCOUNTER — Telehealth: Payer: Self-pay | Admitting: Family Medicine

## 2021-02-16 DIAGNOSIS — R972 Elevated prostate specific antigen [PSA]: Secondary | ICD-10-CM

## 2021-02-16 DIAGNOSIS — E118 Type 2 diabetes mellitus with unspecified complications: Secondary | ICD-10-CM

## 2021-02-16 DIAGNOSIS — M109 Gout, unspecified: Secondary | ICD-10-CM

## 2021-02-16 DIAGNOSIS — D649 Anemia, unspecified: Secondary | ICD-10-CM

## 2021-02-16 DIAGNOSIS — N529 Male erectile dysfunction, unspecified: Secondary | ICD-10-CM

## 2021-02-16 DIAGNOSIS — E559 Vitamin D deficiency, unspecified: Secondary | ICD-10-CM

## 2021-02-16 DIAGNOSIS — G2581 Restless legs syndrome: Secondary | ICD-10-CM

## 2021-02-16 NOTE — Telephone Encounter (Signed)
Medication: metFORMIN (GLUCOPHAGE) 500 MG tablet [[id number]]    Has the patient contacted their pharmacy? No. (If no, request that the patient contact the pharmacy for the refill.) (If yes, when and what did the pharmacy advise?)  Preferred Pharmacy (with phone number or street name):  [facility] [place], [address]  [place], [place] [address]  Phone:  [phone] Fax:  [phone]  DEA #:  [id number]  [place] Reason: --     Agent: Please be advised that RX refills may take up to 3 business days. We ask that you follow-up with your pharmacy.

## 2021-02-17 MED ORDER — METFORMIN HCL 500 MG PO TABS: 500.0000 mg | ORAL_TABLET | Freq: Two times a day (BID) | ORAL | 1 refills | Status: DC

## 2021-02-17 NOTE — Telephone Encounter (Signed)
Medication refilled

## 2021-02-22 ENCOUNTER — Telehealth: Payer: Self-pay | Admitting: *Deleted

## 2021-02-22 MED ORDER — METOPROLOL SUCCINATE ER 25 MG PO TB24: ORAL_TABLET | ORAL | 1 refills | Status: DC

## 2021-02-22 MED ORDER — ALLOPURINOL 100 MG PO TABS: 100.0000 mg | ORAL_TABLET | Freq: Every day | ORAL | 1 refills | Status: DC

## 2021-02-22 MED ORDER — HYDROCHLOROTHIAZIDE 12.5 MG PO CAPS: ORAL_CAPSULE | ORAL | 1 refills | Status: DC

## 2021-02-22 MED ORDER — DICYCLOMINE HCL 20 MG PO TABS: ORAL_TABLET | ORAL | 1 refills | Status: DC

## 2021-02-22 NOTE — Telephone Encounter (Signed)
rx refills

## 2021-02-23 ENCOUNTER — Telehealth: Payer: Self-pay | Admitting: Family Medicine

## 2021-02-23 ENCOUNTER — Other Ambulatory Visit: Payer: Self-pay

## 2021-02-23 DIAGNOSIS — M109 Gout, unspecified: Secondary | ICD-10-CM

## 2021-02-23 DIAGNOSIS — N529 Male erectile dysfunction, unspecified: Secondary | ICD-10-CM

## 2021-02-23 DIAGNOSIS — G2581 Restless legs syndrome: Secondary | ICD-10-CM

## 2021-02-23 DIAGNOSIS — E118 Type 2 diabetes mellitus with unspecified complications: Secondary | ICD-10-CM

## 2021-02-23 DIAGNOSIS — R972 Elevated prostate specific antigen [PSA]: Secondary | ICD-10-CM

## 2021-02-23 DIAGNOSIS — D649 Anemia, unspecified: Secondary | ICD-10-CM

## 2021-02-23 DIAGNOSIS — E559 Vitamin D deficiency, unspecified: Secondary | ICD-10-CM

## 2021-02-23 MED ORDER — METOPROLOL SUCCINATE ER 25 MG PO TB24: ORAL_TABLET | ORAL | 1 refills | Status: DC

## 2021-02-23 MED ORDER — METFORMIN HCL 500 MG PO TABS: 500.0000 mg | ORAL_TABLET | Freq: Two times a day (BID) | ORAL | 1 refills | Status: DC

## 2021-02-23 NOTE — Telephone Encounter (Signed)
Medication:metFORMIN (GLUCOPHAGE) 500 MG tablet [[id number]]    metoprolol succinate (TOPROL-XL) 25 MG 24 hr tablet [[id number]]      Has the patient contacted their pharmacy? no (If no, request that the patient contact the pharmacy for the refill.) (If yes, when and what did the pharmacy advise?)    Preferred Pharmacy (with phone number or street name):   [place], [place] Phone:  [phone]  Fax:  [phone]         Agent: Please be advised that RX refills may take up to 3 business days. We ask that you follow-up with your pharmacy.

## 2021-02-23 NOTE — Telephone Encounter (Signed)
Sent in to [facility]

## 2021-02-24 ENCOUNTER — Other Ambulatory Visit: Payer: Self-pay | Admitting: *Deleted

## 2021-02-24 ENCOUNTER — Telehealth: Payer: Self-pay

## 2021-02-24 MED ORDER — HYDROCHLOROTHIAZIDE 12.5 MG PO CAPS: ORAL_CAPSULE | ORAL | 1 refills | Status: DC

## 2021-02-24 MED ORDER — ALLOPURINOL 100 MG PO TABS: 100.0000 mg | ORAL_TABLET | Freq: Every day | ORAL | 1 refills | Status: DC

## 2021-02-24 MED ORDER — DICYCLOMINE HCL 20 MG PO TABS: ORAL_TABLET | ORAL | 1 refills | Status: DC

## 2021-02-24 NOTE — Telephone Encounter (Signed)
Rx's sent in. °

## 2021-02-24 NOTE — Telephone Encounter (Signed)
Caller Name Koren Bound Phone Number [phone] Patient Name Ancel Easler Patient DOB 1946-05-06 Call Type Message Only Information Provided Reason for Call Request for General Office Information Initial Comment caller with [facility] pharmacy needing to leave a message caller states they have 3 refill request ( Dicyclomine 20mg , Allopurinol 100mg  and HCTZ 12.5mg  capsules ) Disp. Time Disposition Final User 02/23/2021 7:51:43 PM General Information Provided Yes Kenton Kingfisher, Lanette Call Closed By: Nelia Shi Transaction Date/Time: 02/23/2021 7:48:26 PM (ET)

## 2021-03-01 ENCOUNTER — Other Ambulatory Visit: Payer: Self-pay | Admitting: Family Medicine

## 2021-03-01 ENCOUNTER — Telehealth: Payer: Self-pay

## 2021-03-01 MED ORDER — HYDROCODONE-ACETAMINOPHEN 10-325 MG PO TABS: 1.0000 | ORAL_TABLET | Freq: Two times a day (BID) | ORAL | 0 refills | Status: DC | PRN

## 2021-03-01 NOTE — Telephone Encounter (Signed)
Requesting: hydrocodone 10/325mg  (called and clarified with pt.  He meant for hydrocodone) Contract: 06/15/20 UDS:10/08/17 Last Visit: 12/20/20 Next Visit: 04/18/21 Last Refill: 06/15/20  Please Advise

## 2021-03-01 NOTE — Telephone Encounter (Signed)
Nurse Assessment Nurse: Wynetta Emery, RN, Santiago Glad Date/Time Eilene Ghazi Time): 02/28/2021 6:34:56 PM Confirm and document reason for call. If symptomatic, describe symptoms. ---Caller states he is out of oxycodone- 10-325-prn last dose was 2 days ago. He is having back pain- chronic. currently a 4/5 out of 10. [place] [phone]. Does the patient have any new or worsening symptoms? ---Yes Will a triage be completed? ---Yes Related visit to physician within the last 2 weeks? ---N/A Does the PT have any chronic conditions? (i.e. diabetes, asthma, this includes High risk factors for pregnancy, etc.) ---Yes List chronic conditions. ---chronic back pain Is this a behavioral health or substance abuse call? ---No Nurse: Wynetta Emery, RN, Santiago Glad Date/Time Eilene Ghazi Time): 02/28/2021 6:40:13 PM Please select the assessment type ---Request for controlled medication refill Additional Documentation ---oxycodone- 10-325-[facility] [place]- [phone]. Is there an on-call physician for the client? ---Yes Do the client directives specifically allow for paging the on-call regarding scheduled drugs? ---No Guidelines Guideline Title Affirmed Question Affirmed Notes Nurse Date/Time Eilene Ghazi Time) Back Pain [1] MODERATE back pain (e.g., interferes with Wynetta Emery, RN, Santiago Glad 02/28/2021 6:37:42 PM PLEASE NOTE: All timestamps contained within this report are represented as [place] Standard Time. CONFIDENTIALTY NOTICE: This fax transmission is intended only for the addressee. It contains information that is legally privileged, confidential or otherwise protected from use or disclosure. If you are not the intended recipient, you are strictly prohibited from reviewing, disclosing, copying using or disseminating any of this information or taking any action in reliance on or regarding this information. If you have received this fax in error, please notify us immediately by telephone so that we can arrange for its return  to [place]. Phone: [phone], Toll-Free: [phone], Fax: [phone] Page: 2 of 2 Call Id: [id number] Guidelines Guideline Title Affirmed Question Affirmed Notes Nurse Date/Time Eilene Ghazi Time) normal activities) AND [2] present > 3 days Disp. Time Eilene Ghazi Time) Disposition Final User 02/28/2021 6:39:56 PM SEE PCP WITHIN 3 DAYS Yes Wynetta Emery, RN, York Pellant Disagree/Comply Comply Caller Understands Yes PreDisposition Did not know what to do Care Advice Given Per Guideline SEE PCP WITHIN 3 DAYS: * You need to be seen within 2 or 3 days. CALL BACK IF: * You become worse Referrals REFERRED TO PCP OFFICE

## 2021-03-01 NOTE — Telephone Encounter (Signed)
I have sent in the pain medication

## 2021-03-02 NOTE — Telephone Encounter (Signed)
Left detail message about medication being sent in

## 2021-03-30 ENCOUNTER — Other Ambulatory Visit: Payer: Self-pay | Admitting: Family Medicine

## 2021-04-18 ENCOUNTER — Encounter: Payer: Self-pay | Admitting: Family Medicine

## 2021-04-18 ENCOUNTER — Other Ambulatory Visit: Payer: Self-pay

## 2021-04-18 ENCOUNTER — Ambulatory Visit (INDEPENDENT_AMBULATORY_CARE_PROVIDER_SITE_OTHER): Payer: Medicare HMO | Admitting: Family Medicine

## 2021-04-18 VITALS — BP 132/74 | HR 62 | Temp 98.2°F | Resp 16 | Ht 71.0 in | Wt 207.2 lb

## 2021-04-18 DIAGNOSIS — N189 Chronic kidney disease, unspecified: Secondary | ICD-10-CM

## 2021-04-18 DIAGNOSIS — I10 Essential (primary) hypertension: Secondary | ICD-10-CM | POA: Diagnosis not present

## 2021-04-18 DIAGNOSIS — E118 Type 2 diabetes mellitus with unspecified complications: Secondary | ICD-10-CM

## 2021-04-18 DIAGNOSIS — M1A0710 Idiopathic chronic gout, right ankle and foot, without tophus (tophi): Secondary | ICD-10-CM

## 2021-04-18 DIAGNOSIS — D649 Anemia, unspecified: Secondary | ICD-10-CM

## 2021-04-18 DIAGNOSIS — E559 Vitamin D deficiency, unspecified: Secondary | ICD-10-CM

## 2021-04-18 DIAGNOSIS — Z0000 Encounter for general adult medical examination without abnormal findings: Secondary | ICD-10-CM

## 2021-04-18 DIAGNOSIS — R972 Elevated prostate specific antigen [PSA]: Secondary | ICD-10-CM

## 2021-04-18 DIAGNOSIS — N289 Disorder of kidney and ureter, unspecified: Secondary | ICD-10-CM

## 2021-04-18 DIAGNOSIS — E119 Type 2 diabetes mellitus without complications: Secondary | ICD-10-CM

## 2021-04-18 DIAGNOSIS — E7849 Other hyperlipidemia: Secondary | ICD-10-CM

## 2021-04-18 DIAGNOSIS — E038 Other specified hypothyroidism: Secondary | ICD-10-CM

## 2021-04-18 NOTE — Assessment & Plan Note (Signed)
Supplement and monitor 

## 2021-04-18 NOTE — Assessment & Plan Note (Signed)
Tolerating statin, encouraged heart healthy diet, avoid trans fats, minimize simple carbs and saturated fats. Increase exercise as tolerated 

## 2021-04-18 NOTE — Assessment & Plan Note (Signed)
hgba1c acceptable, minimize simple carbs. Increase exercise as tolerated. Continue current meds 

## 2021-04-18 NOTE — Assessment & Plan Note (Signed)
His urologist has retired and the group has been unable to get him set up with one of the other providers. We will recheck PSA today and continue to monitor. If it has gone higher he will have to return to urology

## 2021-04-18 NOTE — Assessment & Plan Note (Signed)
Well controlled, no changes to meds. Encouraged heart healthy diet such as the DASH diet and exercise as tolerated.  °

## 2021-04-18 NOTE — Assessment & Plan Note (Signed)
Patient encouraged to maintain heart healthy diet, regular exercise, adequate sleep. Consider daily probiotics. Take medications as prescribed. Labs ordered and reviewed. Reviewed immunizations. Encouraged Tdap due next year, encouraged Shingrix, covid booster and pneumonia shots. He declines all today but will consider booster and Tdap

## 2021-04-18 NOTE — Progress Notes (Signed)
Patient ID: Daniel Gates, male    DOB: 08/02/1946  Age: 75 y.o. MRN: [id number]    Subjective:  Subjective  HPI Daniel Gates presents for comprehensive physical exam today and follow up on management of chronic concerns for elevated kidney markers and DM. He states that he has no recent sicknesses or recent ER visits to report. He states that he was unable to see a nephrologist. He denies any chest pain, SOB, fever, abdominal pain, cough, chills, sore throat, dysuria, urinary incontinence, back pain, HA, or N/VD. He reports that his 32 y/o sister Opal Sidles has passed away on January 24, 2023 due to dementia and thyroid complications. He states that his brother has CAD and his other brother has prostate cancer which he is in treatment for at the moment.   Review of Systems  Constitutional: Negative for chills, fatigue and fever.  HENT: Negative for congestion, rhinorrhea, sinus pressure, sinus pain and sore throat.   Eyes: Negative for pain.  Respiratory: Negative for cough and shortness of breath.   Cardiovascular: Negative for chest pain, palpitations and leg swelling.  Gastrointestinal: Negative for abdominal pain, blood in stool, diarrhea, nausea and vomiting.  Genitourinary: Negative for flank pain, frequency and penile pain.  Musculoskeletal: Negative for back pain.  Neurological: Negative for headaches.    History Past Medical History:  Diagnosis Date  . Allergic rhinitis   . Anxiety 2003   "after cancer surgery"  . Asthma    childhood per pt.  . Bradycardia 10/01/2016  . Cancer ([place])    colon; diagnosed 2003  . Cataract 12/29/2016   not sure which eye  . Chronic renal insufficiency 06/07/2017  . Depression   . Diabetes type 2, controlled ([place]) 06/27/2012  . Glaucoma   . Gout   . Hyperlipidemia   . Hypertension   . Medicare annual wellness visit, subsequent 02/15/2014  . Open-angle glaucoma 12/29/2016  . Sickle cell anemia ([place]) 06/12/2017   "they say I have a trace"  . TMJ  arthralgia 01/02/2016    He has a past surgical history that includes Inguinal hernia repair (1997); Colonoscopy; and Colon surgery (2003).   His family history includes Cancer in his father; Dementia in his sister; Diabetes in his mother and sister; Heart disease in his brother and mother; Prostate cancer in his brother and father; Thyroid disease in his sister.He reports that he has never smoked. He has never used smokeless tobacco. He reports that he does not drink alcohol and does not use drugs.  Current Outpatient Medications on File Prior to Visit  Medication Sig Dispense Refill  . allopurinol (ZYLOPRIM) 100 MG tablet Take 1 tablet (100 mg total) by mouth daily. 90 tablet 1  . aspirin 325 MG tablet Take 325 mg by mouth daily.    Marland Kitchen atorvastatin (LIPITOR) 40 MG tablet Take 1 tablet (40 mg total) by mouth at bedtime. 30 tablet 3  . Blood Glucose Monitoring Suppl (TRUE METRIX AIR GLUCOSE METER) w/Device KIT Use to check blood sugar twice a day.  Dx code: E11.9 1 kit 0  . COD LIVER OIL PO Take 1 tablet by mouth daily.    Marland Kitchen COVID-19 mRNA vaccine, Pfizer, 30 MCG/0.3ML injection INJECT AS DIRECTED .3 mL 0  . dicyclomine (BENTYL) 20 MG tablet TAKE 1 TABLET(20 MG) BY MOUTH TWICE DAILY 180 tablet 1  . escitalopram (LEXAPRO) 10 MG tablet Take 1 tablet (10 mg total) by mouth daily. 90 tablet 1  . fenofibrate (TRICOR) 145 MG tablet TAKE 1  TABLET(130 MG) BY MOUTH DAILY BEFORE BREAKFAST 90 tablet 3  . Garlic Oil 7035 MG CAPS Take 1 capsule by mouth daily.    Marland Kitchen glucose blood (TRUE METRIX BLOOD GLUCOSE TEST) test strip Use to check blood sugar twice a day.  Dx code: E11.9 200 each 1  . hydrochlorothiazide (MICROZIDE) 12.5 MG capsule TAKE 1 CAPSULE(12.5 MG) BY MOUTH DAILY. DISCONTINUE 25MG DOSE 90 capsule 1  . HYDROcodone-acetaminophen (NORCO) 10-325 MG tablet Take 1 tablet by mouth 2 (two) times daily as needed. 30 tablet 0  . latanoprost (XALATAN) 0.005 % ophthalmic solution Place 1 drop into both eyes  nightly.    . metFORMIN (GLUCOPHAGE) 500 MG tablet Take 1 tablet (500 mg total) by mouth 2 (two) times daily with a meal. 180 tablet 1  . metoprolol succinate (TOPROL-XL) 25 MG 24 hr tablet TAKE 1 TABLET(25 MG) BY MOUTH DAILY 90 tablet 1  . Multiple Vitamins-Minerals (ONE-A-DAY MENS HEALTH FORMULA) TABS Take 1 tablet by mouth daily.    . promethazine (PHENERGAN) 25 MG tablet TAKE ONE (1) TABLET(S) EVERY FOUR (4) HOURS AS NEEDED FOR NAUSEA 60 tablet 0  . rOPINIRole (REQUIP) 1 MG tablet Take 1-2 tablets (1-2 mg total) by mouth at bedtime. 180 tablet 0  . tamsulosin (FLOMAX) 0.4 MG CAPS capsule TAKE 1 CAPSULE(0.4 MG) BY MOUTH DAILY AFTER SUPPER 90 capsule 1  . triamcinolone cream (KENALOG) 0.1 % Apply 1 application topically 2 (two) times daily. 30 g 0  . TRUEplus Lancets 33G MISC Use to check blood sugar twice a day.  Dx code: E11.9 200 each 1  . colchicine 0.6 MG tablet 2 tabs po once then 1 tab po q 2 hours prn pain til pain gone, max of 6 tabs in 24 hours or intolerable diarrhea (Patient not taking: No sig reported) 6 tablet 1   No current facility-administered medications on file prior to visit.     Objective:  Objective  Physical Exam Constitutional:      General: He is not in acute distress.    Appearance: Normal appearance. He is not ill-appearing or toxic-appearing.  HENT:     Head: Normocephalic and atraumatic.     Right Ear: Tympanic membrane, ear canal and external ear normal.     Left Ear: Tympanic membrane, ear canal and external ear normal.     Nose: No congestion or rhinorrhea.  Eyes:     General:        Right eye: No discharge.        Left eye: No discharge.     Extraocular Movements: Extraocular movements intact.     Right eye: No nystagmus.     Left eye: No nystagmus.     Pupils: Pupils are equal, round, and reactive to light.  Cardiovascular:     Rate and Rhythm: Normal rate and regular rhythm.     Pulses: Normal pulses.     Heart sounds: Normal heart sounds. No  murmur heard.   Pulmonary:     Effort: Pulmonary effort is normal. No respiratory distress.     Breath sounds: Normal breath sounds. No wheezing, rhonchi or rales.  Abdominal:     General: Bowel sounds are normal.     Palpations: Abdomen is soft. There is no mass.     Tenderness: There is no abdominal tenderness. There is no guarding.  Musculoskeletal:        General: Normal range of motion.     Cervical back: Normal range of motion and neck  supple.  Feet:     Right foot:     Protective Sensation: 7 sites tested. 7 sites sensed.     Skin integrity: No ulcer, blister or skin breakdown.     Left foot:     Protective Sensation: 7 sites tested. 7 sites sensed.     Skin integrity: No ulcer, blister or skin breakdown.     Comments: -white and moist skin between 4th & 5th toe bilaterally -fungus in right big toe Skin:    General: Skin is warm and dry.  Neurological:     Mental Status: He is alert and oriented to person, place, and time.  Psychiatric:        Behavior: Behavior normal.    BP 132/74   Pulse 62   Temp 98.2 F (36.8 C)   Resp 16   Ht _0  (1.803 m)   Wt 207 lb 3.2 oz (94 kg)   SpO2 99%   BMI 28.90 kg/m  Wt Readings from Last 3 Encounters:  04/18/21 207 lb 3.2 oz (94 kg)  12/20/20 210 lb (95.3 kg)  07/01/20 196 lb 9.6 oz (89.2 kg)     Lab Results  Component Value Date   WBC 6.5 06/15/2020   HGB 13.5 06/15/2020   HCT 42.0 06/15/2020   PLT 256.0 06/15/2020   GLUCOSE 102 (H) 07/27/2020   CHOL 146 06/15/2020   TRIG 123.0 06/15/2020   HDL 38.00 (L) 06/15/2020   LDLDIRECT 80.0 01/13/2020   LDLCALC 84 06/15/2020   ALT 20 07/27/2020   AST 23 07/27/2020   NA 137 07/27/2020   K 4.5 07/27/2020   CL 104 07/27/2020   CREATININE 1.71 (H) 07/27/2020   BUN 19 07/27/2020   CO2 24 07/27/2020   TSH 3.10 06/15/2020   PSA 9.10 (H) 02/05/2018   HGBA1C 6.0 06/15/2020   MICROALBUR <0.7 02/05/2018    VAS [place] ABI WITH/WO TBI  Result Date: 01/27/2020 LOWER  EXTREMITY DOPPLER STUDY Indications: Poor palpation of pulses. Patient reports no claudication or rest              pain. High Risk Factors: Hypertension, hyperlipidemia, Diabetes.  Performing Technologist: Ronal Fear RVS, RCS  Examination Guidelines: A complete evaluation includes at minimum, Doppler waveform signals and systolic blood pressure reading at the level of bilateral brachial, anterior tibial, and posterior tibial arteries, when vessel segments are accessible. Bilateral testing is considered an integral part of a complete examination. Photoelectric Plethysmograph (PPG) waveforms and toe systolic pressure readings are included as required and additional duplex testing as needed. Limited examinations for reoccurring indications may be performed as noted.  ABI Findings: +---------+------------------+-----+---------+--------+ Right    Rt Pressure (mmHg)IndexWaveform Comment  +---------+------------------+-----+---------+--------+ PTA      159               1.14 triphasic         +---------+------------------+-----+---------+--------+ DP       133               0.96 triphasic         +---------+------------------+-----+---------+--------+ Great Toe107               0.77                   +---------+------------------+-----+---------+--------+ +---------+------------------+-----+---------+-------+ Left     Lt Pressure (mmHg)IndexWaveform Comment +---------+------------------+-----+---------+-------+ Brachial 139                                     +---------+------------------+-----+---------+-------+  PTA      161               1.16 triphasic        +---------+------------------+-----+---------+-------+ DP       139               1.00 triphasic        +---------+------------------+-----+---------+-------+ Great Toe111               0.80                  +---------+------------------+-----+---------+-------+  +-------+-----------+-----------+------------+------------+ ABI/TBIToday's ABIToday's TBIPrevious ABIPrevious TBI +-------+-----------+-----------+------------+------------+ Right  1.14       0.77                                +-------+-----------+-----------+------------+------------+ Left   1.16       0.80                                +-------+-----------+-----------+------------+------------+  Summary: Right: Resting right ankle-brachial index is within normal range. No evidence of significant right lower extremity arterial disease. The right toe-brachial index is normal. Left: Resting left ankle-brachial index is within normal range. No evidence of significant left lower extremity arterial disease. The left toe-brachial index is normal.  *See table(s) above for measurements and observations.  Electronically signed by Curt Jews MD on 01/27/2020 at 9:32:51 AM.   Final      Assessment & Plan:  Plan    No orders of the defined types were placed in this encounter.   Problem List Items Addressed This Visit    Hypothyroidism    On Levothyroxine, continue to monitor       Vitamin D deficiency    Supplement and monitor        Hyperlipidemia    Tolerating statin, encouraged heart healthy diet, avoid trans fats, minimize simple carbs and saturated fats. Increase exercise as tolerated       Relevant Orders   CBC with Differential/Platelet   Comprehensive metabolic panel   Lipid panel   Anemia   Relevant Orders   CBC with Differential/Platelet   Comprehensive metabolic panel   PSA   Lipid panel   Essential hypertension    Well controlled, no changes to meds. Encouraged heart healthy diet such as the DASH diet and exercise as tolerated.        Relevant Orders   Hemoglobin A1c   CBC with Differential/Platelet   Comprehensive metabolic panel   PSA   TSH   Lipid panel   Gout   Elevated PSA    His urologist has retired and the group has been unable to get him  set up with one of the other providers. We will recheck PSA today and continue to monitor. If it has gone higher he will have to return to urology      Relevant Orders   PSA   Non-insulin treated type 2 diabetes mellitus ([place])    hgba1c acceptable, minimize simple carbs. Increase exercise as tolerated. Continue current meds       Preventative health care - Primary    Patient encouraged to maintain heart healthy diet, regular exercise, adequate sleep. Consider daily probiotics. Take medications as prescribed. Labs ordered and reviewed. Reviewed immunizations. Encouraged Tdap due next year, encouraged Shingrix, covid booster and pneumonia shots. He declines all today but  will consider booster and Tdap      Relevant Orders   Hemoglobin A1c   CBC with Differential/Platelet   Comprehensive metabolic panel   PSA   TSH   Lipid panel   Chronic renal insufficiency    Hydrate and monitor        Other Visit Diagnoses    Controlled type 2 diabetes mellitus with complication, without long-term current use of insulin (HCC)       Relevant Orders   Hemoglobin A1c   Renal insufficiency       Relevant Orders   Microalbumin / creatinine urine ratio     Colonoscopy: Last completed on 06/19/2017, one pre-cancerous polyp was found and removed by Dr. Ardis Hughs, he recommended repeat in 5 years.   Follow-up: Return in about 6 months (around 10/19/2021), or f/u DM.   I,David Hanna,acting as a scribe for Penni Homans, MD.,have documented all relevant documentation on the behalf of Penni Homans, MD,as directed by  Penni Homans, MD while in the presence of Penni Homans, MD.  I, [name], MD personally performed the services described in this documentation. All medical record entries made by the scribe were at my direction and in my presence. I have reviewed the chart and agree that the record reflects my personal performance and is accurate and complete

## 2021-04-18 NOTE — Patient Instructions (Signed)
Shingrix is the new shingles shot, 2 shots over 2-6 months, confirm coverage with insurance and document, then can return here for shots with nurse appt or at [place] 65 Years and Older, Male Preventive care refers to lifestyle choices and visits with your health care provider that can promote health and wellness. This includes:  A yearly physical exam. This is also called an annual wellness visit.  Regular dental and eye exams.  Immunizations.  Screening for certain conditions.  Healthy lifestyle choices, such as: ? Eating a healthy diet. ? Getting regular exercise. ? Not using drugs or products that contain nicotine and tobacco. ? Limiting alcohol use. What can I expect for my preventive care visit? Physical exam Your health care provider will check your:  Height and weight. These may be used to calculate your BMI (body mass index). BMI is a measurement that tells if you are at a healthy weight.  Heart rate and blood pressure.  Body temperature.  Skin for abnormal spots. Counseling Your health care provider may ask you questions about your:  Past medical problems.  Family's medical history.  Alcohol, tobacco, and drug use.  Emotional well-being.  Home life and relationship well-being.  Sexual activity.  Diet, exercise, and sleep habits.  History of falls.  Memory and ability to understand (cognition).  Work and work Statistician.  Access to firearms. What immunizations do I need? Vaccines are usually given at various ages, according to a schedule. Your health care provider will recommend vaccines for you based on your age, medical history, and lifestyle or other factors, such as travel or where you work.   What tests do I need? Blood tests  Lipid and cholesterol levels. These may be checked every 5 years, or more often depending on your overall health.  Hepatitis C test.  Hepatitis B test. Screening  Lung cancer screening. You may have  this screening every year starting at age 53 if you have a 30-pack-year history of smoking and currently smoke or have quit within the past 15 years.  Colorectal cancer screening. ? All adults should have this screening starting at age 59 and continuing until age 49. ? Your health care provider may recommend screening at age 41 if you are at increased risk. ? You will have tests every 1-10 years, depending on your results and the type of screening test.  Prostate cancer screening. Recommendations will vary depending on your family history and other risks.  Genital exam to check for testicular cancer or hernias.  Diabetes screening. ? This is done by checking your blood sugar (glucose) after you have not eaten for a while (fasting). ? You may have this done every 1-3 years.  Abdominal aortic aneurysm (AAA) screening. You may need this if you are a current or former smoker.  STD (sexually transmitted disease) testing, if you are at risk. Follow these instructions at home: Eating and drinking  Eat a diet that includes fresh fruits and vegetables, whole grains, lean protein, and low-fat dairy products. Limit your intake of foods with high amounts of sugar, saturated fats, and salt.  Take vitamin and mineral supplements as recommended by your health care provider.  Do not drink alcohol if your health care provider tells you not to drink.  If you drink alcohol: ? Limit how much you have to 0-2 drinks a day. ? Be aware of how much alcohol is in your drink. In the [place] one drink equals one 12 oz bottle of beer (355  mL), one 5 oz glass of wine (148 mL), or one 1 oz glass of hard liquor (44 mL).   Lifestyle  Take daily care of your teeth and gums. Brush your teeth every morning and night with fluoride toothpaste. Floss one time each day.  Stay active. Exercise for at least 30 minutes 5 or more days each week.  Do not use any products that contain nicotine or tobacco, such as cigarettes,  e-cigarettes, and chewing tobacco. If you need help quitting, ask your health care provider.  Do not use drugs.  If you are sexually active, practice safe sex. Use a condom or other form of protection to prevent STIs (sexually transmitted infections).  Talk with your health care provider about taking a low-dose aspirin or statin.  Find healthy ways to cope with stress, such as: ? Meditation, yoga, or listening to music. ? Journaling. ? Talking to a trusted person. ? Spending time with friends and family. Safety  Always wear your seat belt while driving or riding in a vehicle.  Do not drive: ? If you have been drinking alcohol. Do not ride with someone who has been drinking. ? When you are tired or distracted. ? While texting.  Wear a helmet and other protective equipment during sports activities.  If you have firearms in your house, make sure you follow all gun safety procedures. What's next?  Visit your health care provider once a year for an annual wellness visit.  Ask your health care provider how often you should have your eyes and teeth checked.  Stay up to date on all vaccines. This information is not intended to replace advice given to you by your health care provider. Make sure you discuss any questions you have with your health care provider. Document Revised: 08/19/2019 Document Reviewed: 11/14/2018 Elsevier Patient Education  2021 [employer].

## 2021-04-18 NOTE — Assessment & Plan Note (Signed)
Hydrate and monitor 

## 2021-04-18 NOTE — Assessment & Plan Note (Signed)
On Levothyroxine, continue to monitor 

## 2021-04-19 LAB — CBC WITH DIFFERENTIAL/PLATELET
Basophils Absolute: 0.1 10*3/uL (ref 0.0–0.1)
Basophils Relative: 0.9 % (ref 0.0–3.0)
Eosinophils Absolute: 0.1 10*3/uL (ref 0.0–0.7)
Eosinophils Relative: 1.7 % (ref 0.0–5.0)
HCT: 43.6 % (ref 39.0–52.0)
Hemoglobin: 14.4 g/dL (ref 13.0–17.0)
Lymphocytes Relative: 41.7 % (ref 12.0–46.0)
Lymphs Abs: 2.8 10*3/uL (ref 0.7–4.0)
MCHC: 33 g/dL (ref 30.0–36.0)
MCV: 77.9 fl — ABNORMAL LOW (ref 78.0–100.0)
Monocytes Absolute: 0.9 10*3/uL (ref 0.1–1.0)
Monocytes Relative: 13.3 % — ABNORMAL HIGH (ref 3.0–12.0)
Neutro Abs: 2.8 10*3/uL (ref 1.4–7.7)
Neutrophils Relative %: 42.4 % — ABNORMAL LOW (ref 43.0–77.0)
Platelets: 229 10*3/uL (ref 150.0–400.0)
RBC: 5.59 Mil/uL (ref 4.22–5.81)
RDW: 16.5 % — ABNORMAL HIGH (ref 11.5–15.5)
WBC: 6.6 10*3/uL (ref 4.0–10.5)

## 2021-04-19 LAB — LIPID PANEL
Cholesterol: 181 mg/dL (ref 0–200)
HDL: 40 mg/dL (ref >39.00)
NonHDL: 141.17
Total CHOL/HDL Ratio: 5
Triglycerides: 275 mg/dL — ABNORMAL HIGH (ref 0.0–149.0)
VLDL: 55 mg/dL — ABNORMAL HIGH (ref 0.0–40.0)

## 2021-04-19 LAB — TSH: TSH: 3.43 u[IU]/mL (ref 0.35–4.50)

## 2021-04-19 LAB — COMPREHENSIVE METABOLIC PANEL
ALT: 19 U/L (ref 0–53)
AST: 18 U/L (ref 0–37)
Albumin: 4.2 g/dL (ref 3.5–5.2)
Alkaline Phosphatase: 70 U/L (ref 39–117)
BUN: 21 mg/dL (ref 6–23)
CO2: 29 mEq/L (ref 19–32)
Calcium: 10.2 mg/dL (ref 8.4–10.5)
Chloride: 100 mEq/L (ref 96–112)
Creatinine, Ser: 1.43 mg/dL (ref 0.40–1.50)
GFR: 47.68 mL/min — ABNORMAL LOW (ref >60.00)
Glucose, Bld: 76 mg/dL (ref 70–99)
Potassium: 4.7 mEq/L (ref 3.5–5.1)
Sodium: 138 mEq/L (ref 135–145)
Total Bilirubin: 0.4 mg/dL (ref 0.2–1.2)
Total Protein: 8 g/dL (ref 6.0–8.3)

## 2021-04-19 LAB — PSA: PSA: 5.88 ng/mL — ABNORMAL HIGH (ref 0.10–4.00)

## 2021-04-19 LAB — HEMOGLOBIN A1C: Hgb A1c MFr Bld: 6.4 % (ref 4.6–6.5)

## 2021-04-19 LAB — MICROALBUMIN / CREATININE URINE RATIO
Creatinine,U: 79.4 mg/dL
Microalb Creat Ratio: 0.9 mg/g (ref 0.0–30.0)
Microalb, Ur: <0.7 mg/dL (ref 0.0–1.9)

## 2021-04-19 LAB — LDL CHOLESTEROL, DIRECT: Direct LDL: 93 mg/dL

## 2021-04-29 ENCOUNTER — Telehealth: Payer: Self-pay | Admitting: Family Medicine

## 2021-04-29 ENCOUNTER — Other Ambulatory Visit: Payer: Self-pay

## 2021-04-29 MED ORDER — FENOFIBRATE 145 MG PO TABS: ORAL_TABLET | ORAL | 3 refills | Status: DC

## 2021-04-29 MED ORDER — ESCITALOPRAM OXALATE 10 MG PO TABS: 10.0000 mg | ORAL_TABLET | Freq: Every day | ORAL | 1 refills | Status: DC

## 2021-04-29 MED ORDER — ALLOPURINOL 100 MG PO TABS: 100.0000 mg | ORAL_TABLET | Freq: Every day | ORAL | 1 refills | Status: DC

## 2021-04-29 MED ORDER — ROPINIROLE HCL 1 MG PO TABS: 1.0000 mg | ORAL_TABLET | Freq: Every day | ORAL | 0 refills | Status: DC

## 2021-04-29 NOTE — Telephone Encounter (Signed)
Medications sent

## 2021-04-29 NOTE — Telephone Encounter (Signed)
Medication:fenofibrate (TRICOR) 145 MG tablet  allopurinol (ZYLOPRIM) 100 MG tablet  escitalopram (LEXAPRO) 10 MG tablet  rOPINIRole (REQUIP) 1 MG tablet  Needs these meds refilled now    Has the patient contacted their pharmacy? Yes.   (If no, request that the patient contact the pharmacy for the refill.) (If yes, when and what did the pharmacy advise?)  Preferred Pharmacy (with phone number or street name):   [facility], [address]  [place], [place] [address]  Phone:  [phone] Fax:  [phone]   Agent: Please be advised that RX refills may take up to 3 business days. We ask that you follow-up with your pharmacy.  Patient also requested for ALL his meds to be refilled with [facility] for future

## 2021-05-04 ENCOUNTER — Other Ambulatory Visit: Payer: Self-pay | Admitting: *Deleted

## 2021-05-04 MED ORDER — FENOFIBRATE 145 MG PO TABS: 145.0000 mg | ORAL_TABLET | Freq: Every day | ORAL | 1 refills | Status: DC

## 2021-05-25 ENCOUNTER — Encounter: Payer: Self-pay | Admitting: *Deleted

## 2021-05-25 DIAGNOSIS — H52203 Unspecified astigmatism, bilateral: Secondary | ICD-10-CM | POA: Diagnosis not present

## 2021-05-25 DIAGNOSIS — H401131 Primary open-angle glaucoma, bilateral, mild stage: Secondary | ICD-10-CM | POA: Diagnosis not present

## 2021-05-25 DIAGNOSIS — H2513 Age-related nuclear cataract, bilateral: Secondary | ICD-10-CM | POA: Diagnosis not present

## 2021-05-25 DIAGNOSIS — Z7984 Long term (current) use of oral hypoglycemic drugs: Secondary | ICD-10-CM | POA: Diagnosis not present

## 2021-05-25 DIAGNOSIS — E119 Type 2 diabetes mellitus without complications: Secondary | ICD-10-CM | POA: Diagnosis not present

## 2021-05-25 DIAGNOSIS — H524 Presbyopia: Secondary | ICD-10-CM | POA: Diagnosis not present

## 2021-05-25 DIAGNOSIS — H02834 Dermatochalasis of left upper eyelid: Secondary | ICD-10-CM | POA: Diagnosis not present

## 2021-05-25 DIAGNOSIS — H35033 Hypertensive retinopathy, bilateral: Secondary | ICD-10-CM | POA: Diagnosis not present

## 2021-05-25 DIAGNOSIS — H02831 Dermatochalasis of right upper eyelid: Secondary | ICD-10-CM | POA: Diagnosis not present

## 2021-05-25 LAB — HM DIABETES EYE EXAM

## 2021-07-04 ENCOUNTER — Ambulatory Visit: Payer: Medicare Other | Admitting: *Deleted

## 2021-07-07 ENCOUNTER — Other Ambulatory Visit: Payer: Self-pay

## 2021-07-07 ENCOUNTER — Ambulatory Visit (INDEPENDENT_AMBULATORY_CARE_PROVIDER_SITE_OTHER): Payer: Medicare HMO

## 2021-07-07 ENCOUNTER — Ambulatory Visit: Payer: Medicare HMO

## 2021-07-07 VITALS — BP 130/88 | HR 64 | Temp 97.5°F | Resp 16 | Ht 71.0 in | Wt 197.6 lb

## 2021-07-07 DIAGNOSIS — Z0000 Encounter for general adult medical examination without abnormal findings: Secondary | ICD-10-CM

## 2021-07-07 NOTE — Patient Instructions (Signed)
[name] , Thank you for taking time to come for your Medicare Wellness Visit. I appreciate your ongoing commitment to your health goals. Please review the following plan we discussed and let me know if I can assist you in the future.   Screening recommendations/referrals: Colonoscopy: Completed 2018-Follow recommendations from GI for repeat colonoscopy. Recommended yearly ophthalmology/optometry visit for glaucoma screening and checkup Recommended yearly dental visit for hygiene and checkup  Vaccinations: Influenza vaccine: Due 08/2021 Pneumococcal vaccine: Due-Declined today Tdap vaccine: Up to date-Due 07/23/2029 Shingles vaccine: Discuss with pharmacy   Covid-19: Booster due  Advanced directives: Declined information today  Conditions/risks identified: See problem list  Next appointment: Follow up in one year for your annual wellness visit. 07/11/2022 @ 1:40  Preventive Care 65 Years and Older, Male Preventive care refers to lifestyle choices and visits with your health care provider that can promote health and wellness. What does preventive care include? A yearly physical exam. This is also called an annual well check. Dental exams once or twice a year. Routine eye exams. Ask your health care provider how often you should have your eyes checked. Personal lifestyle choices, including: Daily care of your teeth and gums. Regular physical activity. Eating a healthy diet. Avoiding tobacco and drug use. Limiting alcohol use. Practicing safe sex. Taking low doses of aspirin every day. Taking vitamin and mineral supplements as recommended by your health care provider. What happens during an annual well check? The services and screenings done by your health care provider during your annual well check will depend on your age, overall health, lifestyle risk factors, and family history of disease. Counseling  Your health care provider may ask you questions about your: Alcohol  use. Tobacco use. Drug use. Emotional well-being. Home and relationship well-being. Sexual activity. Eating habits. History of falls. Memory and ability to understand (cognition). Work and work Statistician. Screening  You may have the following tests or measurements: Height, weight, and BMI. Blood pressure. Lipid and cholesterol levels. These may be checked every 5 years, or more frequently if you are over 42 years old. Skin check. Lung cancer screening. You may have this screening every year starting at age 52 if you have a 30-pack-year history of smoking and currently smoke or have quit within the past 15 years. Fecal occult blood test (FOBT) of the stool. You may have this test every year starting at age 41. Flexible sigmoidoscopy or colonoscopy. You may have a sigmoidoscopy every 5 years or a colonoscopy every 10 years starting at age 72. Prostate cancer screening. Recommendations will vary depending on your family history and other risks. Hepatitis C blood test. Hepatitis B blood test. Sexually transmitted disease (STD) testing. Diabetes screening. This is done by checking your blood sugar (glucose) after you have not eaten for a while (fasting). You may have this done every 1-3 years. Abdominal aortic aneurysm (AAA) screening. You may need this if you are a current or former smoker. Osteoporosis. You may be screened starting at age 78 if you are at high risk. Talk with your health care provider about your test results, treatment options, and if necessary, the need for more tests. Vaccines  Your health care provider may recommend certain vaccines, such as: Influenza vaccine. This is recommended every year. Tetanus, diphtheria, and acellular pertussis (Tdap, Td) vaccine. You may need a Td booster every 10 years. Zoster vaccine. You may need this after age 58. Pneumococcal 13-valent conjugate (PCV13) vaccine. One dose is recommended after age 23. Pneumococcal polysaccharide  (  PPSV23) vaccine. One dose is recommended after age 24. Talk to your health care provider about which screenings and vaccines you need and how often you need them. This information is not intended to replace advice given to you by your health care provider. Make sure you discuss any questions you have with your health care provider. Document Released: 12/17/2015 Document Revised: 08/09/2016 Document Reviewed: 09/21/2015 Elsevier Interactive Patient Education  2017 [employer] Prevention in the Home Falls can cause injuries. They can happen to people of all ages. There are many things you can do to make your home safe and to help prevent falls. What can I do on the outside of my home? Regularly fix the edges of walkways and driveways and fix any cracks. Remove anything that might make you trip as you walk through a door, such as a raised step or threshold. Trim any bushes or trees on the path to your home. Use bright outdoor lighting. Clear any walking paths of anything that might make someone trip, such as rocks or tools. Regularly check to see if handrails are loose or broken. Make sure that both sides of any steps have handrails. Any raised decks and porches should have guardrails on the edges. Have any leaves, snow, or ice cleared regularly. Use sand or salt on walking paths during winter. Clean up any spills in your garage right away. This includes oil or grease spills. What can I do in the bathroom? Use night lights. Install grab bars by the toilet and in the tub and shower. Do not use towel bars as grab bars. Use non-skid mats or decals in the tub or shower. If you need to sit down in the shower, use a plastic, non-slip stool. Keep the floor dry. Clean up any water that spills on the floor as soon as it happens. Remove soap buildup in the tub or shower regularly. Attach bath mats securely with double-sided non-slip rug tape. Do not have throw rugs and other things on the  floor that can make you trip. What can I do in the bedroom? Use night lights. Make sure that you have a light by your bed that is easy to reach. Do not use any sheets or blankets that are too big for your bed. They should not hang down onto the floor. Have a firm chair that has side arms. You can use this for support while you get dressed. Do not have throw rugs and other things on the floor that can make you trip. What can I do in the kitchen? Clean up any spills right away. Avoid walking on wet floors. Keep items that you use a lot in easy-to-reach places. If you need to reach something above you, use a strong step stool that has a grab bar. Keep electrical cords out of the way. Do not use floor polish or wax that makes floors slippery. If you must use wax, use non-skid floor wax. Do not have throw rugs and other things on the floor that can make you trip. What can I do with my stairs? Do not leave any items on the stairs. Make sure that there are handrails on both sides of the stairs and use them. Fix handrails that are broken or loose. Make sure that handrails are as long as the stairways. Check any carpeting to make sure that it is firmly attached to the stairs. Fix any carpet that is loose or worn. Avoid having throw rugs at the top or bottom  of the stairs. If you do have throw rugs, attach them to the floor with carpet tape. Make sure that you have a light switch at the top of the stairs and the bottom of the stairs. If you do not have them, ask someone to add them for you. What else can I do to help prevent falls? Wear shoes that: Do not have high heels. Have rubber bottoms. Are comfortable and fit you well. Are closed at the toe. Do not wear sandals. If you use a stepladder: Make sure that it is fully opened. Do not climb a closed stepladder. Make sure that both sides of the stepladder are locked into place. Ask someone to hold it for you, if possible. Clearly mark and make  sure that you can see: Any grab bars or handrails. First and last steps. Where the edge of each step is. Use tools that help you move around (mobility aids) if they are needed. These include: Canes. Walkers. Scooters. Crutches. Turn on the lights when you go into a dark area. Replace any light bulbs as soon as they burn out. Set up your furniture so you have a clear path. Avoid moving your furniture around. If any of your floors are uneven, fix them. If there are any pets around you, be aware of where they are. Review your medicines with your doctor. Some medicines can make you feel dizzy. This can increase your chance of falling. Ask your doctor what other things that you can do to help prevent falls. This information is not intended to replace advice given to you by your health care provider. Make sure you discuss any questions you have with your health care provider. Document Released: 09/16/2009 Document Revised: 04/27/2016 Document Reviewed: 12/25/2014 Elsevier Interactive Patient Education  2017 [employer].

## 2021-07-07 NOTE — Progress Notes (Signed)
Subjective:   Daniel Gates is a 75 y.o. male who presents for Medicare Annual/Subsequent preventive examination.  Review of Systems     Cardiac Risk Factors include: advanced age (>51mn, >>42women);male gender;sedentary lifestyle;diabetes mellitus;dyslipidemia;hypertension     Objective:    Today's Vitals   07/07/21 1255 07/07/21 1300  BP: 130/88   Pulse: 64   Resp: 16   Temp: (!) 97.5 F (36.4 C)   TempSrc: Temporal   SpO2: 96%   Weight: 197 lb 9.6 oz (89.6 kg)   Height: 5' 11"  (1.803 m)   PainSc:  6    Body mass index is 27.56 kg/m.  Advanced Directives 07/07/2021 07/01/2020 01/27/2020 06/30/2019 06/27/2018 08/07/2017 06/26/2017  Does Patient Have a Medical Advance Directive? No No No No No No No  Does patient want to make changes to medical advance directive? - - - - Yes (MAU/Ambulatory/Procedural Areas - Information given) - -  Would patient like information on creating a medical advance directive? No - Patient declined No - Patient declined No - Patient declined No - Patient declined - No - Patient declined Yes (MAU/Ambulatory/Procedural Areas - Information given)    Current Medications (verified) Outpatient Encounter Medications as of 07/07/2021  Medication Sig   allopurinol (ZYLOPRIM) 100 MG tablet Take 1 tablet (100 mg total) by mouth daily.   aspirin 325 MG tablet Take 325 mg by mouth daily.   atorvastatin (LIPITOR) 40 MG tablet Take 1 tablet (40 mg total) by mouth at bedtime.   Blood Glucose Monitoring Suppl (TRUE METRIX AIR GLUCOSE METER) w/Device KIT Use to check blood sugar twice a day.  Dx code: E11.9   COD LIVER OIL PO Take 1 tablet by mouth daily.   colchicine 0.6 MG tablet 2 tabs po once then 1 tab po q 2 hours prn pain til pain gone, max of 6 tabs in 24 hours or intolerable diarrhea   dicyclomine (BENTYL) 20 MG tablet TAKE 1 TABLET(20 MG) BY MOUTH TWICE DAILY   escitalopram (LEXAPRO) 10 MG tablet Take 1 tablet (10 mg total) by mouth daily.   fenofibrate  (TRICOR) 145 MG tablet Take 1 tablet (145 mg total) by mouth daily.   Garlic Oil 10258MG CAPS Take 1 capsule by mouth daily.   glucose blood (TRUE METRIX BLOOD GLUCOSE TEST) test strip Use to check blood sugar twice a day.  Dx code: E11.9   hydrochlorothiazide (MICROZIDE) 12.5 MG capsule TAKE 1 CAPSULE(12.5 MG) BY MOUTH DAILY. DISCONTINUE 25MG DOSE   HYDROcodone-acetaminophen (NORCO) 10-325 MG tablet Take 1 tablet by mouth 2 (two) times daily as needed.   latanoprost (XALATAN) 0.005 % ophthalmic solution Place 1 drop into both eyes nightly.   metFORMIN (GLUCOPHAGE) 500 MG tablet Take 1 tablet (500 mg total) by mouth 2 (two) times daily with a meal.   metoprolol succinate (TOPROL-XL) 25 MG 24 hr tablet TAKE 1 TABLET(25 MG) BY MOUTH DAILY   Multiple Vitamins-Minerals (ONE-A-DAY MENS HEALTH FORMULA) TABS Take 1 tablet by mouth daily.   promethazine (PHENERGAN) 25 MG tablet TAKE ONE (1) TABLET(S) EVERY FOUR (4) HOURS AS NEEDED FOR NAUSEA   rOPINIRole (REQUIP) 1 MG tablet Take 1-2 tablets (1-2 mg total) by mouth at bedtime.   tamsulosin (FLOMAX) 0.4 MG CAPS capsule TAKE 1 CAPSULE(0.4 MG) BY MOUTH DAILY AFTER SUPPER   triamcinolone cream (KENALOG) 0.1 % Apply 1 application topically 2 (two) times daily.   TRUEplus Lancets 33G MISC Use to check blood sugar twice a day.  Dx code:  E11.9   [DISCONTINUED] COVID-19 mRNA vaccine, Pfizer, 30 MCG/0.3ML injection INJECT AS DIRECTED   No facility-administered encounter medications on file as of 07/07/2021.    Allergies (verified) Patient has no known allergies.   History: Past Medical History:  Diagnosis Date   Allergic rhinitis    Anxiety 2003   "after cancer surgery"   Asthma    childhood per pt.   Bradycardia 10/01/2016   Cancer ([place])    colon; diagnosed 2003   Cataract 12/29/2016   not sure which eye   Chronic renal insufficiency 06/07/2017   Depression    Diabetes type 2, controlled ([place]) 06/27/2012   Glaucoma    Gout    Hyperlipidemia     Hypertension    Medicare annual wellness visit, subsequent 02/15/2014   Open-angle glaucoma 12/29/2016   Sickle cell anemia ([place]) 06/12/2017   "they say I have a trace"   TMJ arthralgia 01/02/2016   Past Surgical History:  Procedure Laterality Date   COLON SURGERY  2003   for colon cancer   COLONOSCOPY     INGUINAL HERNIA REPAIR  1997   right   Family History  Problem Relation Age of Onset   Diabetes Mother    Heart disease Mother        MI   Cancer Father        prostate   Prostate cancer Father    Dementia Sister    Thyroid disease Sister    Heart disease Brother        CAD s/p CABG   Prostate cancer Brother    Diabetes Sister    Colon cancer Neg Hx    Stomach cancer Neg Hx    Esophageal cancer Neg Hx    Rectal cancer Neg Hx    Social History   Socioeconomic History   Marital status: Widowed    Spouse name: Not on file   Number of children: 7   Years of education: Not on file   Highest education level: Not on file  Occupational History    Employer: DISABLED  Tobacco Use   Smoking status: Never   Smokeless tobacco: Never  Vaping Use   Vaping Use: Never used  Substance and Sexual Activity   Alcohol use: No   Drug use: No   Sexual activity: Yes  Other Topics Concern   Not on file  Social History Narrative   Not on file   Social Determinants of Health   Financial Resource Strain: Low Risk    Difficulty of Paying Living Expenses: Not hard at all  Food Insecurity: No Food Insecurity   Worried About Charity fundraiser in the Last Year: Never true   [place] in the Last Year: Never true  Transportation Needs: No Transportation Needs   Lack of Transportation (Medical): No   Lack of Transportation (Non-Medical): No  Physical Activity: Inactive   Days of Exercise per Week: 0 days   Minutes of Exercise per Session: 0 min  Stress: No Stress Concern Present   Feeling of Stress : Not at all  Social Connections: Moderately Isolated   Frequency of  Communication with Friends and Family: More than three times a week   Frequency of Social Gatherings with Friends and Family: More than three times a week   Attends Religious Services: More than 4 times per year   Active Member of [employer] or Organizations: No   Attends Archivist Meetings: Never   Marital Status: Widowed  Tobacco Counseling Counseling given: Not Answered   Clinical Intake:  Pre-visit preparation completed: Yes  Pain : 0-10 Pain Score: 6  Pain Type: Acute pain Pain Location: Neck Pain Onset: Yesterday Pain Frequency: Constant     Nutritional Status: BMI 25 -29 Overweight Nutritional Risks: Unintentional weight loss (pt states he has lost 10 pounds in the past 3 months) Diabetes: Yes CBG done?: No Did pt. bring in CBG monitor from home?: No  How often do you need to have someone help you when you read instructions, pamphlets, or other written materials from your doctor or pharmacy?: 1 - Never  Diabetes:  Is the patient diabetic?  Yes  If diabetic, was a CBG obtained today?  No  Did the patient bring in their glucometer from home?  No  How often do you monitor your CBG's? daily.   Financial Strains and Diabetes Management:  Are you having any financial strains with the device, your supplies or your medication? No .  Does the patient want to be seen by Chronic Care Management for management of their diabetes?  No  Would the patient like to be referred to a Nutritionist or for Diabetic Management?  No   Diabetic Exams:  Diabetic Eye Exam: Completed 05/25/2021.   Diabetic Foot Exam: Pt has been advised about the importance in completing this exam. To be completed by PCP.    Interpreter Needed?: No  Information entered by :: Caroleen Hamman LPN   Activities of Daily Living In your present state of health, do you have any difficulty performing the following activities: 07/07/2021  Hearing? N  Vision? N  Difficulty concentrating or making  decisions? N  Walking or climbing stairs? N  Dressing or bathing? N  Doing errands, shopping? N  Preparing Food and eating ? N  Using the Toilet? N  In the past six months, have you accidently leaked urine? N  Do you have problems with loss of bowel control? N  Managing your Medications? N  Managing your Finances? N  Housekeeping or managing your Housekeeping? N  Some recent data might be hidden    Patient Care Team: [name], MD as PCP - General (Family Medicine) Linward Natal, MD as Consulting Physician (Ophthalmology)  Indicate any recent Medical Services you may have received from other than [facility] providers in the past year (date may be approximate).     Assessment:   This is a routine wellness examination for Daniel Gates.  Hearing/Vision screen Hearing Screening - Comments:: No issues Vision Screening - Comments:: Last eye exam-05/2021-  Dietary issues and exercise activities discussed: Current Exercise Habits: The patient does not participate in regular exercise at present, Exercise limited by: None identified   Goals Addressed             This Visit's Progress    Decrease soda or juice intake   Not on track    Eat more fruits and vegetables   On track    Increase physical activity   Not on track    Walk at least 3 times per week         Depression Screen PHQ 2/9 Scores 07/07/2021 04/18/2021 07/01/2020 06/30/2019 06/27/2018 06/26/2017 06/23/2016  PHQ - 2 Score 1 2 0 0 0 0 2  PHQ- 9 Score - 2 - - - - 4    Fall Risk Fall Risk  07/07/2021 04/18/2021 07/01/2020 06/30/2019 06/27/2018  Falls in the past year? 0 1 1 0 No  Comment - -  tripped while mowing the yard - -  Number falls in past yr: 0 0 0 - -  Injury with Fall? 0 0 0 - -  Follow up Falls prevention discussed - Education provided;Falls prevention discussed - -    FALL RISK PREVENTION PERTAINING TO THE HOME:  Any stairs in or around the home? Yes  If so, are there any without handrails? No  Home free of  loose throw rugs in walkways, pet beds, electrical cords, etc? Yes  Adequate lighting in your home to reduce risk of falls? Yes   ASSISTIVE DEVICES UTILIZED TO PREVENT FALLS:  Life alert? No  Use of a cane, walker or w/c? No  Grab bars in the bathroom? No  Shower chair or bench in shower? No  Elevated toilet seat or a handicapped toilet? No   TIMED UP AND GO:  Was the test performed? Yes .  Length of time to ambulate 10 feet: 10 sec.   Gait steady and fast without use of assistive device  Cognitive Function:Normal cognitive status assessed by direct observation by this Nurse Health Advisor. No abnormalities found.   MMSE - Mini Mental State Exam 06/27/2018 06/26/2017 06/23/2016  Orientation to time 5 5 5   Orientation to Place 5 5 5   Registration 3 3 3   Attention/ Calculation 5 5 5   Recall 2 2 2   Language- name 2 objects 2 2 2   Language- repeat 1 1 1   Language- follow 3 step command 3 3 3   Language- read & follow direction 1 1 1   Write a sentence 1 1 1   Copy design 1 1 1   Total score 29 29 29      6CIT Screen 07/07/2021 07/01/2020  What Year? 0 points 0 points  What month? 0 points 0 points  What time? 0 points 0 points  Count back from 20 0 points 0 points  Months in reverse 2 points 2 points  Repeat phrase 0 points 0 points  Total Score 2 2    Immunizations Immunization History  Administered Date(s) Administered   PFIZER(Purple Top)SARS-COV-2 Vaccination 02/01/2020, 03/10/2020, 12/28/2020   Td 12/04/1997, 07/14/2019   Tdap 01/30/2012    TDAP status: Up to date  Flu Vaccine status: Declined, Education has been provided regarding the importance of this vaccine but patient still declined. Advised may receive this vaccine at local pharmacy or Health Dept. Aware to provide a copy of the vaccination record if obtained from local pharmacy or Health Dept. Verbalized acceptance and understanding.  Pneumococcal vaccine status: Declined,  Education has been provided regarding  the importance of this vaccine but patient still declined. Advised may receive this vaccine at local pharmacy or Health Dept. Aware to provide a copy of the vaccination record if obtained from local pharmacy or Health Dept. Verbalized acceptance and understanding.   Covid-19 vaccine status: Information provided on how to obtain vaccines. Booster due  Qualifies for Shingles Vaccine? Yes   Zostavax completed No   Shingrix Completed?: Yes  Screening Tests Health Maintenance  Topic Date Due   Zoster Vaccines- Shingrix (1 of 2) Never done   PNA vac Low Risk Adult (2 of 2 - PPSV23) 12/23/2016   FOOT EXAM  05/18/2017   COVID-19 Vaccine (4 - Booster for Pfizer series) 03/28/2021   HEMOGLOBIN A1C  10/19/2021   URINE MICROALBUMIN  04/18/2022   OPHTHALMOLOGY EXAM  05/25/2022   COLONOSCOPY (Pts 45-30yr Insurance coverage will need to be confirmed)  06/12/2022   TETANUS/TDAP  07/13/2029   Hepatitis  C Screening  Completed   HPV VACCINES  Aged Out   INFLUENZA VACCINE  Discontinued    Health Maintenance  Health Maintenance Due  Topic Date Due   Zoster Vaccines- Shingrix (1 of 2) Never done   PNA vac Low Risk Adult (2 of 2 - PPSV23) 12/23/2016   FOOT EXAM  05/18/2017   COVID-19 Vaccine (4 - Booster for Pfizer series) 03/28/2021    Colorectal cancer screening: No longer required.   Lung Cancer Screening: (Low Dose CT Chest recommended if Age 71-80 years, 30 pack-year currently smoking OR have quit w/in 15years.) does not qualify.     Additional Screening:  Hepatitis C Screening:  Completed 12/24/2015  Vision Screening: Recommended annual ophthalmology exams for early detection of glaucoma and other disorders of the eye. Is the patient up to date with their annual eye exam?  Yes  Who is the provider or what is the name of the office in which the patient attends annual eye exams? Pt unsure of name   Dental Screening: Recommended annual dental exams for proper oral hygiene  Community  Resource Referral / Chronic Care Management: CRR required this visit?  No   CCM required this visit?  No      Plan:     I have personally reviewed and noted the following in the patient's chart:   Medical and social history Use of alcohol, tobacco or illicit drugs  Current medications and supplements including opioid prescriptions. Patient is not currently taking opioid prescriptions. Functional ability and status Nutritional status Physical activity Advanced directives List of other physicians Hospitalizations, surgeries, and ER visits in previous 12 months Vitals Screenings to include cognitive, depression, and falls Referrals and appointments  In addition, I have reviewed and discussed with patient certain preventive protocols, quality metrics, and best practice recommendations. A written personalized care plan for preventive services as well as general preventive health recommendations were provided to patient.      Marta Antu, LPN   02/08/5435  Nurse Health Advisor  Nurse Notes: None

## 2021-07-20 ENCOUNTER — Other Ambulatory Visit: Payer: Self-pay | Admitting: Family Medicine

## 2021-08-17 ENCOUNTER — Other Ambulatory Visit: Payer: Self-pay | Admitting: *Deleted

## 2021-08-17 DIAGNOSIS — D649 Anemia, unspecified: Secondary | ICD-10-CM

## 2021-08-17 DIAGNOSIS — G2581 Restless legs syndrome: Secondary | ICD-10-CM

## 2021-08-17 DIAGNOSIS — E559 Vitamin D deficiency, unspecified: Secondary | ICD-10-CM

## 2021-08-17 DIAGNOSIS — E118 Type 2 diabetes mellitus with unspecified complications: Secondary | ICD-10-CM

## 2021-08-17 DIAGNOSIS — M109 Gout, unspecified: Secondary | ICD-10-CM

## 2021-08-17 DIAGNOSIS — R972 Elevated prostate specific antigen [PSA]: Secondary | ICD-10-CM

## 2021-08-17 DIAGNOSIS — N529 Male erectile dysfunction, unspecified: Secondary | ICD-10-CM

## 2021-08-17 MED ORDER — TAMSULOSIN HCL 0.4 MG PO CAPS: ORAL_CAPSULE | ORAL | 1 refills | Status: DC

## 2021-10-24 ENCOUNTER — Encounter: Payer: Self-pay | Admitting: Family Medicine

## 2021-10-24 ENCOUNTER — Ambulatory Visit (INDEPENDENT_AMBULATORY_CARE_PROVIDER_SITE_OTHER): Payer: Medicare HMO | Admitting: Family Medicine

## 2021-10-24 ENCOUNTER — Ambulatory Visit: Payer: Medicare HMO | Attending: Internal Medicine

## 2021-10-24 ENCOUNTER — Other Ambulatory Visit: Payer: Self-pay

## 2021-10-24 VITALS — BP 122/70 | HR 70 | Temp 97.4°F | Resp 16 | Wt 202.6 lb

## 2021-10-24 DIAGNOSIS — M109 Gout, unspecified: Secondary | ICD-10-CM

## 2021-10-24 DIAGNOSIS — R7989 Other specified abnormal findings of blood chemistry: Secondary | ICD-10-CM | POA: Diagnosis not present

## 2021-10-24 DIAGNOSIS — E119 Type 2 diabetes mellitus without complications: Secondary | ICD-10-CM | POA: Diagnosis not present

## 2021-10-24 DIAGNOSIS — G2581 Restless legs syndrome: Secondary | ICD-10-CM | POA: Diagnosis not present

## 2021-10-24 DIAGNOSIS — N189 Chronic kidney disease, unspecified: Secondary | ICD-10-CM

## 2021-10-24 DIAGNOSIS — I10 Essential (primary) hypertension: Secondary | ICD-10-CM | POA: Diagnosis not present

## 2021-10-24 DIAGNOSIS — R972 Elevated prostate specific antigen [PSA]: Secondary | ICD-10-CM | POA: Diagnosis not present

## 2021-10-24 DIAGNOSIS — E7849 Other hyperlipidemia: Secondary | ICD-10-CM | POA: Diagnosis not present

## 2021-10-24 DIAGNOSIS — D649 Anemia, unspecified: Secondary | ICD-10-CM | POA: Diagnosis not present

## 2021-10-24 DIAGNOSIS — E559 Vitamin D deficiency, unspecified: Secondary | ICD-10-CM

## 2021-10-24 DIAGNOSIS — M1A0710 Idiopathic chronic gout, right ankle and foot, without tophus (tophi): Secondary | ICD-10-CM

## 2021-10-24 DIAGNOSIS — Z23 Encounter for immunization: Secondary | ICD-10-CM

## 2021-10-24 DIAGNOSIS — E038 Other specified hypothyroidism: Secondary | ICD-10-CM | POA: Diagnosis not present

## 2021-10-24 DIAGNOSIS — N529 Male erectile dysfunction, unspecified: Secondary | ICD-10-CM

## 2021-10-24 DIAGNOSIS — E118 Type 2 diabetes mellitus with unspecified complications: Secondary | ICD-10-CM

## 2021-10-24 LAB — CBC WITH DIFFERENTIAL/PLATELET
Basophils Absolute: 0 10*3/uL (ref 0.0–0.1)
Basophils Relative: 0.4 % (ref 0.0–3.0)
Eosinophils Absolute: 0.1 10*3/uL (ref 0.0–0.7)
Eosinophils Relative: 2.6 % (ref 0.0–5.0)
HCT: 44.4 % (ref 39.0–52.0)
Hemoglobin: 14.1 g/dL (ref 13.0–17.0)
Lymphocytes Relative: 36.5 % (ref 12.0–46.0)
Lymphs Abs: 1.7 10*3/uL (ref 0.7–4.0)
MCHC: 31.7 g/dL (ref 30.0–36.0)
MCV: 79.7 fl (ref 78.0–100.0)
Monocytes Absolute: 0.5 10*3/uL (ref 0.1–1.0)
Monocytes Relative: 10.9 % (ref 3.0–12.0)
Neutro Abs: 2.3 10*3/uL (ref 1.4–7.7)
Neutrophils Relative %: 49.6 % (ref 43.0–77.0)
Platelets: 217 10*3/uL (ref 150.0–400.0)
RBC: 5.57 Mil/uL (ref 4.22–5.81)
RDW: 16.6 % — ABNORMAL HIGH (ref 11.5–15.5)
WBC: 4.7 10*3/uL (ref 4.0–10.5)

## 2021-10-24 LAB — COMPREHENSIVE METABOLIC PANEL
ALT: 21 U/L (ref 0–53)
AST: 22 U/L (ref 0–37)
Albumin: 4.1 g/dL (ref 3.5–5.2)
Alkaline Phosphatase: 47 U/L (ref 39–117)
BUN: 18 mg/dL (ref 6–23)
CO2: 28 mEq/L (ref 19–32)
Calcium: 9.8 mg/dL (ref 8.4–10.5)
Chloride: 103 mEq/L (ref 96–112)
Creatinine, Ser: 1.52 mg/dL — ABNORMAL HIGH (ref 0.40–1.50)
GFR: 44.15 mL/min — ABNORMAL LOW (ref >60.00)
Glucose, Bld: 112 mg/dL — ABNORMAL HIGH (ref 70–99)
Potassium: 4.2 mEq/L (ref 3.5–5.1)
Sodium: 138 mEq/L (ref 135–145)
Total Bilirubin: 0.4 mg/dL (ref 0.2–1.2)
Total Protein: 7.7 g/dL (ref 6.0–8.3)

## 2021-10-24 LAB — LIPID PANEL
Cholesterol: 159 mg/dL (ref 0–200)
HDL: 40.7 mg/dL (ref >39.00)
LDL Cholesterol: 79 mg/dL (ref 0–99)
NonHDL: 118.12
Total CHOL/HDL Ratio: 4
Triglycerides: 195 mg/dL — ABNORMAL HIGH (ref 0.0–149.0)
VLDL: 39 mg/dL (ref 0.0–40.0)

## 2021-10-24 LAB — HEMOGLOBIN A1C: Hgb A1c MFr Bld: 6.1 % (ref 4.6–6.5)

## 2021-10-24 LAB — PSA: PSA: 7.58 ng/mL — ABNORMAL HIGH (ref 0.10–4.00)

## 2021-10-24 LAB — TSH: TSH: 2.99 u[IU]/mL (ref 0.35–5.50)

## 2021-10-24 LAB — URIC ACID: Uric Acid, Serum: 5.6 mg/dL (ref 4.0–7.8)

## 2021-10-24 MED ORDER — ESCITALOPRAM OXALATE 10 MG PO TABS: 10.0000 mg | ORAL_TABLET | Freq: Every day | ORAL | 1 refills | Status: DC

## 2021-10-24 MED ORDER — METOPROLOL SUCCINATE ER 25 MG PO TB24: ORAL_TABLET | ORAL | 1 refills | Status: DC

## 2021-10-24 MED ORDER — HYDROCHLOROTHIAZIDE 12.5 MG PO CAPS
ORAL_CAPSULE | ORAL | 1 refills | Status: DC
Start: 2021-10-24 — End: 2022-02-23

## 2021-10-24 MED ORDER — METFORMIN HCL 500 MG PO TABS
500.0000 mg | ORAL_TABLET | Freq: Two times a day (BID) | ORAL | 1 refills | Status: DC
Start: 2021-10-24 — End: 2022-04-17

## 2021-10-24 MED ORDER — ROPINIROLE HCL 1 MG PO TABS: 1.0000 mg | ORAL_TABLET | Freq: Every day | ORAL | 1 refills | Status: DC

## 2021-10-24 MED ORDER — ATORVASTATIN CALCIUM 40 MG PO TABS: 40.0000 mg | ORAL_TABLET | Freq: Every day | ORAL | 1 refills | Status: DC

## 2021-10-24 MED ORDER — FENOFIBRATE 145 MG PO TABS: 145.0000 mg | ORAL_TABLET | Freq: Every day | ORAL | 1 refills | Status: DC

## 2021-10-24 NOTE — Assessment & Plan Note (Signed)
Supplement and monitor 

## 2021-10-24 NOTE — Progress Notes (Signed)
   Covid-19 Vaccination Clinic  Name:  Daniel Gates    MRN: [id number] DOB: [birth year]  10/24/2021  [name] was observed post Covid-19 immunization for 15 minutes without incident. He was provided with Vaccine Information Sheet and instruction to access the V-Safe system.   [name] was instructed to call 911 with any severe reactions post vaccine: Difficulty breathing  Swelling of face and throat  A fast heartbeat  A bad rash all over body  Dizziness and weakness   Immunizations Administered     Name Date Dose VIS Date Route   Pfizer Covid-19 Vaccine Bivalent Booster 10/24/2021 11:37 AM 0.3 mL 08/03/2021 Intramuscular   Manufacturer: [place]   Lot: [id number]   [place]: [phone]

## 2021-10-24 NOTE — Assessment & Plan Note (Signed)
hgba1c acceptable, minimize simple carbs. Increase exercise as tolerated. Continue current meds 

## 2021-10-24 NOTE — Assessment & Plan Note (Signed)
Well controlled, no changes to meds. Encouraged heart healthy diet such as the DASH diet and exercise as tolerated.  °

## 2021-10-24 NOTE — Assessment & Plan Note (Signed)
Treated with Requip. Hydrate and monitor

## 2021-10-24 NOTE — Patient Instructions (Signed)
Consider COVID shot downstairs  Consider flu shot  Shingrix is the new shingles shot, 2 shots over 2-6 months, confirm coverage with insurance and document, then can return here for shots with nurse appt or at pharmacy   Also [facility] 20 can get at office or at pharmacy  Hypertension, Adult High blood pressure (hypertension) is when the force of blood pumping through the arteries is too strong. The arteries are the blood vessels that carry blood from the heart throughout the body. Hypertension forces the heart to work harder to pump blood and may cause arteries to become narrow or stiff. Untreated or uncontrolled hypertension can cause a heart attack, heart failure, a stroke, kidney disease, and other problems. A blood pressure reading consists of a higher number over a lower number. Ideally, your blood pressure should be below 120/80. The first ("top") number is called the systolic pressure. It is a measure of the pressure in your arteries as your heart beats. The second ("bottom") number is called the diastolic pressure. It is a  measure of the pressure in your arteries as the heart relaxes. What are the causes? The exact cause of this condition is not known. There are some conditions that result in or are related to high blood pressure. What increases the risk? Some risk factors for high blood pressure are under your control. The following factors may make you more likely to develop this condition: Smoking. Having type 2 diabetes mellitus, high cholesterol, or both. Not getting enough exercise or physical activity. Being overweight. Having too much fat, sugar, calories, or salt (sodium) in your diet. Drinking too much alcohol. Some risk factors for high blood pressure may be difficult or impossible to change. Some of these factors include: Having chronic kidney disease. Having a family history of high blood pressure. Age. Risk increases with age. Race. You may be at higher risk if you are  African American. Gender. Men are at higher risk than women before age 68. After age 56, women are at higher risk than men. Having obstructive sleep apnea. Stress. What are the signs or symptoms? High blood pressure may not cause symptoms. Very high blood pressure (hypertensive crisis) may cause: Headache. Anxiety. Shortness of breath. Nosebleed. Nausea and vomiting. Vision changes. Severe chest pain. Seizures. How is this diagnosed? This condition is diagnosed by measuring your blood pressure while you are seated, with your arm resting on a flat surface, your legs uncrossed, and your feet flat on the floor. The cuff of the blood pressure monitor will be placed directly against the skin of your upper arm at the level of your heart. It should be measured at least twice using the same arm. Certain conditions can cause a difference in blood pressure between your right and left arms. Certain factors can cause blood pressure readings to be lower or higher than normal for a short period of time: When your blood pressure is higher when you are in a health care provider's office than when you are at home, this is called white coat hypertension. Most people with this condition do not need medicines. When your blood pressure is higher at home than when you are in a health care provider's office, this is called masked hypertension. Most people with this condition may need medicines to control blood pressure. If you have a high blood pressure reading during one visit or you have normal blood pressure with other risk factors, you may be asked to: Return on a different day to have your  blood pressure checked again. Monitor your blood pressure at home for 1 week or longer. If you are diagnosed with hypertension, you may have other blood or imaging tests to help your health care provider understand your overall risk for other conditions. How is this treated? This condition is treated by making healthy  lifestyle changes, such as eating healthy foods, exercising more, and reducing your alcohol intake. Your health care provider may prescribe medicine if lifestyle changes are not enough to get your blood pressure under control, and if: Your systolic blood pressure is above 130. Your diastolic blood pressure is above 80. Your personal target blood pressure may vary depending on your medical conditions, your age, and other factors. Follow these instructions at home: Eating and drinking  Eat a diet that is high in fiber and potassium, and low in sodium, added sugar, and fat. An example eating plan is called the DASH (Dietary Approaches to Stop Hypertension) diet. To eat this way: Eat plenty of fresh fruits and vegetables. Try to fill one half of your plate at each meal with fruits and vegetables. Eat whole grains, such as whole-wheat pasta, brown rice, or whole-grain bread. Fill about one fourth of your plate with whole grains. Eat or drink low-fat dairy products, such as skim milk or low-fat yogurt. Avoid fatty cuts of meat, processed or cured meats, and poultry with skin. Fill about one fourth of your plate with lean proteins, such as fish, chicken without skin, beans, eggs, or tofu. Avoid pre-made and processed foods. These tend to be higher in sodium, added sugar, and fat. Reduce your daily sodium intake. Most people with hypertension should eat less than 1,500 mg of sodium a day. Do not drink alcohol if: Your health care provider tells you not to drink. You are pregnant, may be pregnant, or are planning to become pregnant. If you drink alcohol: Limit how much you use to: 0-1 drink a day for women. 0-2 drinks a day for men. Be aware of how much alcohol is in your drink. In the [place] one drink equals one 12 oz bottle of beer (355 mL), one 5 oz glass of wine (148 mL), or one 1 oz glass of hard liquor (44 mL). Lifestyle  Work with your health care provider to maintain a healthy body weight or  to lose weight. Ask what an ideal weight is for you. Get at least 30 minutes of exercise most days of the week. Activities may include walking, swimming, or biking. Include exercise to strengthen your muscles (resistance exercise), such as Pilates or lifting weights, as part of your weekly exercise routine. Try to do these types of exercises for 30 minutes at least 3 days a week. Do not use any products that contain nicotine or tobacco, such as cigarettes, e-cigarettes, and chewing tobacco. If you need help quitting, ask your health care provider. Monitor your blood pressure at home as told by your health care provider. Keep all follow-up visits as told by your health care provider. This is important. Medicines Take over-the-counter and prescription medicines only as told by your health care provider. Follow directions carefully. Blood pressure medicines must be taken as prescribed. Do not skip doses of blood pressure medicine. Doing this puts you at risk for problems and can make the medicine less effective. Ask your health care provider about side effects or reactions to medicines that you should watch for. Contact a health care provider if you: Think you are having a reaction to a medicine you  are taking. Have headaches that keep coming back (recurring). Feel dizzy. Have swelling in your ankles. Have trouble with your vision. Get help right away if you: Develop a severe headache or confusion. Have unusual weakness or numbness. Feel faint. Have severe pain in your chest or abdomen. Vomit repeatedly. Have trouble breathing. Summary Hypertension is when the force of blood pumping through your arteries is too strong. If this condition is not controlled, it may put you at risk for serious complications. Your personal target blood pressure may vary depending on your medical conditions, your age, and other factors. For most people, a normal blood pressure is less than 120/80. Hypertension is  treated with lifestyle changes, medicines, or a combination of both. Lifestyle changes include losing weight, eating a healthy, low-sodium diet, exercising more, and limiting alcohol. This information is not intended to replace advice given to you by your health care provider. Make sure you discuss any questions you have with your health care provider. Document Revised: 07/31/2018 Document Reviewed: 07/31/2018 Elsevier Patient Education  [place].

## 2021-10-24 NOTE — Assessment & Plan Note (Signed)
Hydrate and monitor 

## 2021-10-24 NOTE — Progress Notes (Signed)
Subjective:   By signing my name below, I, Zite Okoli, attest that this documentation has been prepared under the direction and in the presence of [name], MD. 10/24/2021      Patient ID: Daniel Gates, male    DOB: 1946-02-19, 75 y.o.   MRN: [id number]  No chief complaint on file.   HPI Patient is in today for an office visit.  He is still experiencing urinary frequency and incontinence. He would like a referral to a urologist.   He checks his blood sugar levels at home. This morning, it was 95.   He has had no problem sleeping and has been managing his restless legs with 1 mg Requip.  He is also requesting a refill on his medication because he has not been able to get refills. Refill 1 mg Requip, 10 mg lexapro, 145 mg fenofibrate, 500 mg metformin, 12.5 mg Microzide,40 mg Lipitor and 25 mg metoprolol.  He denies fever, congestion, eye pain, chest pain, palpitations, leg swelling, shortness of breath, nausea, abdominal pain, diarrhea and blood in stool. Also denies dysuria, frequency, back pain and headaches.   He has 3 Covid-19 vaccines at this time and will receive the booster vaccine today. He is not UTD on flu, shingles and pneumonia vaccines.   Past Medical History:  Diagnosis Date   Allergic rhinitis    Anxiety 2003   "after cancer surgery"   Asthma    childhood per pt.   Bradycardia 10/01/2016   Cancer ([place])    colon; diagnosed 2003   Cataract 12/29/2016   not sure which eye   Chronic renal insufficiency 06/07/2017   Depression    Diabetes type 2, controlled ([place]) 06/27/2012   Glaucoma    Gout    Hyperlipidemia    Hypertension    Medicare annual wellness visit, subsequent 02/15/2014   Open-angle glaucoma 12/29/2016   Sickle cell anemia ([place]) 06/12/2017   "they say I have a trace"   TMJ arthralgia 01/02/2016    Past Surgical History:  Procedure Laterality Date   COLON SURGERY  2003   for colon cancer   COLONOSCOPY     INGUINAL HERNIA REPAIR  1997    right    Family History  Problem Relation Age of Onset   Diabetes Mother    Heart disease Mother        MI   Cancer Father        prostate   Prostate cancer Father    Dementia Sister    Thyroid disease Sister    Heart disease Brother        CAD s/p CABG   Prostate cancer Brother    Diabetes Sister    Colon cancer Neg Hx    Stomach cancer Neg Hx    Esophageal cancer Neg Hx    Rectal cancer Neg Hx     Social History   Socioeconomic History   Marital status: Widowed    Spouse name: Not on file   Number of children: 7   Years of education: Not on file   Highest education level: Not on file  Occupational History    Employer: DISABLED  Tobacco Use   Smoking status: Never   Smokeless tobacco: Never  Vaping Use   Vaping Use: Never used  Substance and Sexual Activity   Alcohol use: No   Drug use: No   Sexual activity: Yes  Other Topics Concern   Not on file  Social History Narrative  Not on file   Social Determinants of Health   Financial Resource Strain: Low Risk    Difficulty of Paying Living Expenses: Not hard at all  Food Insecurity: No Food Insecurity   Worried About Charity fundraiser in the Last Year: Never true   [place] in the Last Year: Never true  Transportation Needs: No Transportation Needs   Lack of Transportation (Medical): No   Lack of Transportation (Non-Medical): No  Physical Activity: Inactive   Days of Exercise per Week: 0 days   Minutes of Exercise per Session: 0 min  Stress: No Stress Concern Present   Feeling of Stress : Not at all  Social Connections: Moderately Isolated   Frequency of Communication with Friends and Family: More than three times a week   Frequency of Social Gatherings with Friends and Family: More than three times a week   Attends Religious Services: More than 4 times per year   Active Member of [employer] or Organizations: No   Attends Archivist Meetings: Never   Marital Status: Widowed  Arboriculturist Violence: Not At Risk   Fear of Current or Ex-Partner: No   Emotionally Abused: No   Physically Abused: No   Sexually Abused: No    Outpatient Medications Prior to Visit  Medication Sig Dispense Refill   allopurinol (ZYLOPRIM) 100 MG tablet Take 1 tablet (100 mg total) by mouth daily. 90 tablet 1   aspirin 325 MG tablet Take 325 mg by mouth daily.     Blood Glucose Monitoring Suppl (TRUE METRIX AIR GLUCOSE METER) w/Device KIT Use to check blood sugar twice a day.  Dx code: E11.9 1 kit 0   COD LIVER OIL PO Take 1 tablet by mouth daily.     colchicine 0.6 MG tablet 2 tabs po once then 1 tab po q 2 hours prn pain til pain gone, max of 6 tabs in 24 hours or intolerable diarrhea 6 tablet 1   dicyclomine (BENTYL) 20 MG tablet TAKE 1 TABLET(20 MG) BY MOUTH TWICE DAILY 622 tablet 1   Garlic Oil 6333 MG CAPS Take 1 capsule by mouth daily.     glucose blood (TRUE METRIX BLOOD GLUCOSE TEST) test strip Use to check blood sugar twice a day.  Dx code: E11.9 200 each 1   HYDROcodone-acetaminophen (NORCO) 10-325 MG tablet Take 1 tablet by mouth 2 (two) times daily as needed. 30 tablet 0   latanoprost (XALATAN) 0.005 % ophthalmic solution Place 1 drop into both eyes nightly.     Multiple Vitamins-Minerals (ONE-A-DAY MENS HEALTH FORMULA) TABS Take 1 tablet by mouth daily.     promethazine (PHENERGAN) 25 MG tablet TAKE ONE (1) TABLET(S) EVERY FOUR (4) HOURS AS NEEDED FOR NAUSEA 60 tablet 0   tamsulosin (FLOMAX) 0.4 MG CAPS capsule TAKE 1 CAPSULE(0.4 MG) BY MOUTH DAILY AFTER SUPPER 90 capsule 1   triamcinolone cream (KENALOG) 0.1 % Apply 1 application topically 2 (two) times daily. 30 g 0   TRUEplus Lancets 33G MISC Use to check blood sugar twice a day.  Dx code: E11.9 200 each 1   atorvastatin (LIPITOR) 40 MG tablet Take 1 tablet (40 mg total) by mouth at bedtime. 30 tablet 3   escitalopram (LEXAPRO) 10 MG tablet Take 1 tablet (10 mg total) by mouth daily. 90 tablet 1   fenofibrate (TRICOR) 145 MG  tablet Take 1 tablet (145 mg total) by mouth daily. 90 tablet 1   hydrochlorothiazide (MICROZIDE) 12.5 MG  capsule TAKE 1 CAPSULE EVERY DAY (DISCONTINUE 25MG DOSE) 90 capsule 1   metFORMIN (GLUCOPHAGE) 500 MG tablet Take 1 tablet (500 mg total) by mouth 2 (two) times daily with a meal. 180 tablet 1   metoprolol succinate (TOPROL-XL) 25 MG 24 hr tablet TAKE 1 TABLET(25 MG) BY MOUTH DAILY 90 tablet 1   rOPINIRole (REQUIP) 1 MG tablet Take 1-2 tablets (1-2 mg total) by mouth at bedtime. 180 tablet 0   No facility-administered medications prior to visit.    No Known Allergies  Review of Systems  Constitutional:  Negative for fever and malaise/fatigue.  HENT:  Negative for congestion.   Eyes:  Negative for redness.  Respiratory:  Negative for shortness of breath.   Cardiovascular:  Negative for chest pain, palpitations and leg swelling.  Gastrointestinal:  Negative for abdominal pain, blood in stool and nausea.  Genitourinary:  Negative for dysuria and frequency.  Musculoskeletal:  Negative for falls.  Skin:  Negative for rash.  Neurological:  Negative for dizziness, loss of consciousness and headaches.  Endo/Heme/Allergies:  Negative for polydipsia.  Psychiatric/Behavioral:  Negative for depression. The patient is not nervous/anxious.       Objective:    Physical Exam Constitutional:      Appearance: Normal appearance. He is not ill-appearing.  HENT:     Head: Normocephalic and atraumatic.     Right Ear: Tympanic membrane, ear canal and external ear normal.     Left Ear: Tympanic membrane, ear canal and external ear normal.  Eyes:     Conjunctiva/sclera: Conjunctivae normal.  Cardiovascular:     Rate and Rhythm: Normal rate and regular rhythm.     Heart sounds: Normal heart sounds. No murmur heard. Pulmonary:     Breath sounds: Normal breath sounds. No wheezing.  Abdominal:     General: Bowel sounds are normal. There is no distension.     Palpations: Abdomen is soft.      Tenderness: There is no abdominal tenderness.     Hernia: No hernia is present.  Musculoskeletal:     Cervical back: Neck supple.  Lymphadenopathy:     Cervical: No cervical adenopathy.  Skin:    General: Skin is warm and dry.  Neurological:     Mental Status: He is alert and oriented to person, place, and time.  Psychiatric:        Behavior: Behavior normal.    BP 122/70   Pulse 70   Temp (!) 97.4 F (36.3 C)   Resp 16   Wt 202 lb 9.6 oz (91.9 kg)   SpO2 97%   BMI 28.26 kg/m  Wt Readings from Last 3 Encounters:  10/24/21 202 lb 9.6 oz (91.9 kg)  07/07/21 197 lb 9.6 oz (89.6 kg)  04/18/21 207 lb 3.2 oz (94 kg)    Diabetic Foot Exam - Simple   No data filed    Lab Results  Component Value Date   WBC 6.6 04/18/2021   HGB 14.4 04/18/2021   HCT 43.6 04/18/2021   PLT 229.0 04/18/2021   GLUCOSE 76 04/18/2021   CHOL 181 04/18/2021   TRIG 275.0 (H) 04/18/2021   HDL 40.00 04/18/2021   LDLDIRECT 93.0 04/18/2021   LDLCALC 84 06/15/2020   ALT 19 04/18/2021   AST 18 04/18/2021   NA 138 04/18/2021   K 4.7 04/18/2021   CL 100 04/18/2021   CREATININE 1.43 04/18/2021   BUN 21 04/18/2021   CO2 29 04/18/2021   TSH 3.43 04/18/2021  PSA 5.88 (H) 04/18/2021   HGBA1C 6.4 04/18/2021   MICROALBUR <0.7 04/18/2021    Lab Results  Component Value Date   TSH 3.43 04/18/2021   Lab Results  Component Value Date   WBC 6.6 04/18/2021   HGB 14.4 04/18/2021   HCT 43.6 04/18/2021   MCV 77.9 (L) 04/18/2021   PLT 229.0 04/18/2021   Lab Results  Component Value Date   NA 138 04/18/2021   K 4.7 04/18/2021   CO2 29 04/18/2021   GLUCOSE 76 04/18/2021   BUN 21 04/18/2021   CREATININE 1.43 04/18/2021   BILITOT 0.4 04/18/2021   ALKPHOS 70 04/18/2021   AST 18 04/18/2021   ALT 19 04/18/2021   PROT 8.0 04/18/2021   ALBUMIN 4.2 04/18/2021   CALCIUM 10.2 04/18/2021   GFR 47.68 (L) 04/18/2021   Lab Results  Component Value Date   CHOL 181 04/18/2021   Lab Results  Component  Value Date   HDL 40.00 04/18/2021   Lab Results  Component Value Date   LDLCALC 84 06/15/2020   Lab Results  Component Value Date   TRIG 275.0 (H) 04/18/2021   Lab Results  Component Value Date   CHOLHDL 5 04/18/2021   Lab Results  Component Value Date   HGBA1C 6.4 04/18/2021       Assessment & Plan:   Problem List Items Addressed This Visit     Hypothyroidism   Relevant Medications   metoprolol succinate (TOPROL-XL) 25 MG 24 hr tablet   Vitamin D deficiency    Supplement and monitor      Relevant Medications   metFORMIN (GLUCOPHAGE) 500 MG tablet   Hyperlipidemia    Tolerating statin, encouraged heart healthy diet, avoid trans fats, minimize simple carbs and saturated fats. Increase exercise as tolerated      Relevant Medications   atorvastatin (LIPITOR) 40 MG tablet   fenofibrate (TRICOR) 145 MG tablet   hydrochlorothiazide (MICROZIDE) 12.5 MG capsule   metoprolol succinate (TOPROL-XL) 25 MG 24 hr tablet   Other Relevant Orders   Lipid panel   Anemia - Primary   Relevant Medications   metFORMIN (GLUCOPHAGE) 500 MG tablet   Other Relevant Orders   CBC with Differential/Platelet   Comprehensive metabolic panel   Essential hypertension    Well controlled, no changes to meds. Encouraged heart healthy diet such as the DASH diet and exercise as tolerated.       Relevant Medications   atorvastatin (LIPITOR) 40 MG tablet   fenofibrate (TRICOR) 145 MG tablet   hydrochlorothiazide (MICROZIDE) 12.5 MG capsule   metoprolol succinate (TOPROL-XL) 25 MG 24 hr tablet   Other Relevant Orders   TSH   Gout   Relevant Medications   metFORMIN (GLUCOPHAGE) 500 MG tablet   Other Relevant Orders   Uric acid   Elevated PSA    Stopped seeing       Relevant Medications   metFORMIN (GLUCOPHAGE) 500 MG tablet   Other Relevant Orders   Ambulatory referral to Urology   PSA   ED (erectile dysfunction)   Relevant Medications   metFORMIN (GLUCOPHAGE) 500 MG tablet    Non-insulin treated type 2 diabetes mellitus (HCC)    hgba1c acceptable, minimize simple carbs. Increase exercise as tolerated. Continue current meds      Relevant Medications   atorvastatin (LIPITOR) 40 MG tablet   metFORMIN (GLUCOPHAGE) 500 MG tablet   Other Relevant Orders   Hemoglobin A1c   Restless leg syndrome    Treated with  Requip. Hydrate and monitor      Relevant Medications   metFORMIN (GLUCOPHAGE) 500 MG tablet   Chronic renal insufficiency    Hydrate and monitor      RESOLVED: Elevated serum creatinine    Hydrate and monitor      Other Visit Diagnoses     PROSTATE SPECIFIC ANTIGEN, ELEVATED       Relevant Medications   metFORMIN (GLUCOPHAGE) 500 MG tablet   Controlled type 2 diabetes mellitus with complication, without long-term current use of insulin (HCC)       Relevant Medications   atorvastatin (LIPITOR) 40 MG tablet   metFORMIN (GLUCOPHAGE) 500 MG tablet   Acute gouty arthropathy       Relevant Medications   metFORMIN (GLUCOPHAGE) 500 MG tablet       Meds ordered this encounter  Medications   rOPINIRole (REQUIP) 1 MG tablet    Sig: Take 1-2 tablets (1-2 mg total) by mouth at bedtime.    Dispense:  180 tablet    Refill:  1   atorvastatin (LIPITOR) 40 MG tablet    Sig: Take 1 tablet (40 mg total) by mouth at bedtime.    Dispense:  90 tablet    Refill:  1   escitalopram (LEXAPRO) 10 MG tablet    Sig: Take 1 tablet (10 mg total) by mouth daily.    Dispense:  90 tablet    Refill:  1   fenofibrate (TRICOR) 145 MG tablet    Sig: Take 1 tablet (145 mg total) by mouth daily.    Dispense:  90 tablet    Refill:  1   hydrochlorothiazide (MICROZIDE) 12.5 MG capsule    Sig: TAKE 1 CAPSULE EVERY DAY (DISCONTINUE 25MG DOSE)    Dispense:  90 capsule    Refill:  1   metFORMIN (GLUCOPHAGE) 500 MG tablet    Sig: Take 1 tablet (500 mg total) by mouth 2 (two) times daily with a meal.    Dispense:  180 tablet    Refill:  1   metoprolol succinate  (TOPROL-XL) 25 MG 24 hr tablet    Sig: TAKE 1 TABLET(25 MG) BY MOUTH DAILY    Dispense:  90 tablet    Refill:  1    I,Zite Okoli,acting as a scribe for Penni Homans, MD.,have documented all relevant documentation on the behalf of Penni Homans, MD,as directed by  Penni Homans, MD while in the presence of Penni Homans, MD.   I, [name], MD., personally preformed the services described in this documentation.  All medical record entries made by the scribe were at my direction and in my presence.  I have reviewed the chart and discharge instructions (if applicable) and agree that the record reflects my personal performance and is accurate and complete. 10/24/2021

## 2021-10-24 NOTE — Assessment & Plan Note (Signed)
Tolerating statin, encouraged heart healthy diet, avoid trans fats, minimize simple carbs and saturated fats. Increase exercise as tolerated 

## 2021-10-24 NOTE — Assessment & Plan Note (Signed)
Stopped seeing

## 2021-10-26 DIAGNOSIS — H04123 Dry eye syndrome of bilateral lacrimal glands: Secondary | ICD-10-CM | POA: Diagnosis not present

## 2021-10-26 DIAGNOSIS — H401131 Primary open-angle glaucoma, bilateral, mild stage: Secondary | ICD-10-CM | POA: Diagnosis not present

## 2021-11-01 ENCOUNTER — Encounter: Payer: Self-pay | Admitting: *Deleted

## 2021-11-05 ENCOUNTER — Other Ambulatory Visit (HOSPITAL_BASED_OUTPATIENT_CLINIC_OR_DEPARTMENT_OTHER): Payer: Self-pay

## 2021-11-05 MED ORDER — PFIZER COVID-19 VAC BIVALENT 30 MCG/0.3ML IM SUSP
INTRAMUSCULAR | 0 refills | Status: DC
  Filled 2021-11-05: qty 0.3, 1d supply, fill #0

## 2021-11-07 ENCOUNTER — Telehealth: Payer: Self-pay | Admitting: *Deleted

## 2021-11-07 ENCOUNTER — Other Ambulatory Visit (HOSPITAL_BASED_OUTPATIENT_CLINIC_OR_DEPARTMENT_OTHER): Payer: Self-pay

## 2021-11-07 NOTE — Telephone Encounter (Signed)
Patient results given for labs from 10/24/21.  He is scheduled to be seen at [facility] in about 2 weeks.

## 2021-12-04 ENCOUNTER — Other Ambulatory Visit: Payer: Self-pay | Admitting: Family Medicine

## 2021-12-09 DIAGNOSIS — R972 Elevated prostate specific antigen [PSA]: Secondary | ICD-10-CM | POA: Diagnosis not present

## 2021-12-09 DIAGNOSIS — N401 Enlarged prostate with lower urinary tract symptoms: Secondary | ICD-10-CM | POA: Diagnosis not present

## 2021-12-09 DIAGNOSIS — N5201 Erectile dysfunction due to arterial insufficiency: Secondary | ICD-10-CM | POA: Diagnosis not present

## 2021-12-09 DIAGNOSIS — R350 Frequency of micturition: Secondary | ICD-10-CM | POA: Diagnosis not present

## 2022-01-31 DIAGNOSIS — H401131 Primary open-angle glaucoma, bilateral, mild stage: Secondary | ICD-10-CM | POA: Diagnosis not present

## 2022-02-23 ENCOUNTER — Other Ambulatory Visit: Payer: Self-pay | Admitting: Family Medicine

## 2022-02-28 ENCOUNTER — Other Ambulatory Visit: Payer: Self-pay | Admitting: Family Medicine

## 2022-02-28 DIAGNOSIS — N529 Male erectile dysfunction, unspecified: Secondary | ICD-10-CM

## 2022-02-28 DIAGNOSIS — M109 Gout, unspecified: Secondary | ICD-10-CM

## 2022-02-28 DIAGNOSIS — E559 Vitamin D deficiency, unspecified: Secondary | ICD-10-CM

## 2022-02-28 DIAGNOSIS — G2581 Restless legs syndrome: Secondary | ICD-10-CM

## 2022-02-28 DIAGNOSIS — D649 Anemia, unspecified: Secondary | ICD-10-CM

## 2022-02-28 DIAGNOSIS — R972 Elevated prostate specific antigen [PSA]: Secondary | ICD-10-CM

## 2022-02-28 DIAGNOSIS — E118 Type 2 diabetes mellitus with unspecified complications: Secondary | ICD-10-CM

## 2022-04-15 ENCOUNTER — Other Ambulatory Visit: Payer: Self-pay | Admitting: Family Medicine

## 2022-04-15 DIAGNOSIS — D649 Anemia, unspecified: Secondary | ICD-10-CM

## 2022-04-15 DIAGNOSIS — E118 Type 2 diabetes mellitus with unspecified complications: Secondary | ICD-10-CM

## 2022-04-15 DIAGNOSIS — R972 Elevated prostate specific antigen [PSA]: Secondary | ICD-10-CM

## 2022-04-15 DIAGNOSIS — G2581 Restless legs syndrome: Secondary | ICD-10-CM

## 2022-04-15 DIAGNOSIS — M109 Gout, unspecified: Secondary | ICD-10-CM

## 2022-04-15 DIAGNOSIS — N529 Male erectile dysfunction, unspecified: Secondary | ICD-10-CM

## 2022-04-15 DIAGNOSIS — E559 Vitamin D deficiency, unspecified: Secondary | ICD-10-CM

## 2022-04-25 ENCOUNTER — Encounter: Payer: Self-pay | Admitting: Gastroenterology

## 2022-05-03 NOTE — Progress Notes (Unsigned)
Subjective:    Patient ID: Daniel Gates, male    DOB: [birth year], 76 y.o.   MRN: [id number]  No chief complaint on file.   HPI Patient is in today for his annual physical exam.  Past Medical History:  Diagnosis Date   Allergic rhinitis    Anxiety 2003   "after cancer surgery"   Asthma    childhood per pt.   Bradycardia 10/01/2016   Cancer ([place])    colon; diagnosed 2003   Cataract 12/29/2016   not sure which eye   Chronic renal insufficiency 06/07/2017   Depression    Diabetes type 2, controlled ([place]) 06/27/2012   Glaucoma    Gout    Hyperlipidemia    Hypertension    Medicare annual wellness visit, subsequent 02/15/2014   Open-angle glaucoma 12/29/2016   Sickle cell anemia ([place]) 06/12/2017   "they say I have a trace"   TMJ arthralgia 01/02/2016    Past Surgical History:  Procedure Laterality Date   COLON SURGERY  2003   for colon cancer   COLONOSCOPY     INGUINAL HERNIA REPAIR  1997   right    Family History  Problem Relation Age of Onset   Diabetes Mother    Heart disease Mother        MI   Cancer Father        prostate   Prostate cancer Father    Dementia Sister    Thyroid disease Sister    Heart disease Brother        CAD s/p CABG   Prostate cancer Brother    Diabetes Sister    Colon cancer Neg Hx    Stomach cancer Neg Hx    Esophageal cancer Neg Hx    Rectal cancer Neg Hx     Social History   Socioeconomic History   Marital status: Widowed    Spouse name: Not on file   Number of children: 7   Years of education: Not on file   Highest education level: Not on file  Occupational History    Employer: DISABLED  Tobacco Use   Smoking status: Never   Smokeless tobacco: Never  Vaping Use   Vaping Use: Never used  Substance and Sexual Activity   Alcohol use: No   Drug use: No   Sexual activity: Yes  Other Topics Concern   Not on file  Social History Narrative   Not on file   Social Determinants of Health   Financial Resource Strain:  Low Risk    Difficulty of Paying Living Expenses: Not hard at all  Food Insecurity: No Food Insecurity   Worried About Charity fundraiser in the Last Year: Never true   [place] in the Last Year: Never true  Transportation Needs: No Transportation Needs   Lack of Transportation (Medical): No   Lack of Transportation (Non-Medical): No  Physical Activity: Inactive   Days of Exercise per Week: 0 days   Minutes of Exercise per Session: 0 min  Stress: No Stress Concern Present   Feeling of Stress : Not at all  Social Connections: Moderately Isolated   Frequency of Communication with Friends and Family: More than three times a week   Frequency of Social Gatherings with Friends and Family: More than three times a week   Attends Religious Services: More than 4 times per year   Active Member of [employer] or Organizations: No   Attends Archivist Meetings: Never  Marital Status: Widowed  Human resources officer Violence: Not At Risk   Fear of Current or Ex-Partner: No   Emotionally Abused: No   Physically Abused: No   Sexually Abused: No    Outpatient Medications Prior to Visit  Medication Sig Dispense Refill   allopurinol (ZYLOPRIM) 100 MG tablet TAKE 1 TABLET EVERY DAY 90 tablet 1   aspirin 325 MG tablet Take 325 mg by mouth daily.     atorvastatin (LIPITOR) 40 MG tablet Take 1 tablet (40 mg total) by mouth at bedtime. 90 tablet 1   Blood Glucose Monitoring Suppl (TRUE METRIX AIR GLUCOSE METER) w/Device KIT Use to check blood sugar twice a day.  Dx code: E11.9 1 kit 0   COD LIVER OIL PO Take 1 tablet by mouth daily.     colchicine 0.6 MG tablet 2 tabs po once then 1 tab po q 2 hours prn pain til pain gone, max of 6 tabs in 24 hours or intolerable diarrhea 6 tablet 1   COVID-19 mRNA bivalent vaccine, Pfizer, (PFIZER COVID-19 VAC BIVALENT) injection Inject into the muscle. 0.3 mL 0   dicyclomine (BENTYL) 20 MG tablet TAKE 1 TABLET TWICE DAILY 180 tablet 1   escitalopram (LEXAPRO)  10 MG tablet Take 1 tablet (10 mg total) by mouth daily. 90 tablet 1   fenofibrate (TRICOR) 145 MG tablet Take 1 tablet (145 mg total) by mouth daily. 90 tablet 1   Garlic Oil 3953 MG CAPS Take 1 capsule by mouth daily.     glucose blood (TRUE METRIX BLOOD GLUCOSE TEST) test strip Use to check blood sugar twice a day.  Dx code: E11.9 200 each 1   hydrochlorothiazide (MICROZIDE) 12.5 MG capsule TAKE 1 CAPSULE EVERY DAY 90 capsule 1   HYDROcodone-acetaminophen (NORCO) 10-325 MG tablet Take 1 tablet by mouth 2 (two) times daily as needed. 30 tablet 0   latanoprost (XALATAN) 0.005 % ophthalmic solution Place 1 drop into both eyes nightly.     metFORMIN (GLUCOPHAGE) 500 MG tablet TAKE 1 TABLET TWICE DAILY WITH A MEAL 180 tablet 1   metoprolol succinate (TOPROL-XL) 25 MG 24 hr tablet TAKE 1 TABLET(25 MG) BY MOUTH DAILY 90 tablet 1   Multiple Vitamins-Minerals (ONE-A-DAY MENS HEALTH FORMULA) TABS Take 1 tablet by mouth daily.     promethazine (PHENERGAN) 25 MG tablet TAKE ONE (1) TABLET(S) EVERY FOUR (4) HOURS AS NEEDED FOR NAUSEA 60 tablet 0   rOPINIRole (REQUIP) 1 MG tablet Take 1-2 tablets (1-2 mg total) by mouth at bedtime. 180 tablet 1   tamsulosin (FLOMAX) 0.4 MG CAPS capsule TAKE 1 CAPSULE EVERY DAY AFTER SUPPER 90 capsule 1   triamcinolone cream (KENALOG) 0.1 % Apply 1 application topically 2 (two) times daily. 30 g 0   TRUEplus Lancets 33G MISC Use to check blood sugar twice a day.  Dx code: E11.9 200 each 1   No facility-administered medications prior to visit.    No Known Allergies  ROS     Objective:    Physical Exam  There were no vitals taken for this visit. Wt Readings from Last 3 Encounters:  10/24/21 202 lb 9.6 oz (91.9 kg)  07/07/21 197 lb 9.6 oz (89.6 kg)  04/18/21 207 lb 3.2 oz (94 kg)    Diabetic Foot Exam - Simple   No data filed    Lab Results  Component Value Date   WBC 4.7 10/24/2021   HGB 14.1 10/24/2021   HCT 44.4 10/24/2021   PLT 217.0 10/24/2021  GLUCOSE 112 (H) 10/24/2021   CHOL 159 10/24/2021   TRIG 195.0 (H) 10/24/2021   HDL 40.70 10/24/2021   LDLDIRECT 93.0 04/18/2021   LDLCALC 79 10/24/2021   ALT 21 10/24/2021   AST 22 10/24/2021   NA 138 10/24/2021   K 4.2 10/24/2021   CL 103 10/24/2021   CREATININE 1.52 (H) 10/24/2021   BUN 18 10/24/2021   CO2 28 10/24/2021   TSH 2.99 10/24/2021   PSA 7.58 (H) 10/24/2021   HGBA1C 6.1 10/24/2021   MICROALBUR <0.7 04/18/2021    Lab Results  Component Value Date   TSH 2.99 10/24/2021   Lab Results  Component Value Date   WBC 4.7 10/24/2021   HGB 14.1 10/24/2021   HCT 44.4 10/24/2021   MCV 79.7 10/24/2021   PLT 217.0 10/24/2021   Lab Results  Component Value Date   NA 138 10/24/2021   K 4.2 10/24/2021   CO2 28 10/24/2021   GLUCOSE 112 (H) 10/24/2021   BUN 18 10/24/2021   CREATININE 1.52 (H) 10/24/2021   BILITOT 0.4 10/24/2021   ALKPHOS 47 10/24/2021   AST 22 10/24/2021   ALT 21 10/24/2021   PROT 7.7 10/24/2021   ALBUMIN 4.1 10/24/2021   CALCIUM 9.8 10/24/2021   GFR 44.15 (L) 10/24/2021   Lab Results  Component Value Date   CHOL 159 10/24/2021   Lab Results  Component Value Date   HDL 40.70 10/24/2021   Lab Results  Component Value Date   LDLCALC 79 10/24/2021   Lab Results  Component Value Date   TRIG 195.0 (H) 10/24/2021   Lab Results  Component Value Date   CHOLHDL 4 10/24/2021   Lab Results  Component Value Date   HGBA1C 6.1 10/24/2021       Assessment & Plan:   Problem List Items Addressed This Visit   None   I am having Daniel Gates maintain his aspirin, Garlic Oil, COD LIVER OIL PO, promethazine, One-A-Day Mens Health Formula, latanoprost, triamcinolone cream, colchicine, True Metrix Air Glucose Meter, True Metrix Blood Glucose Test, TRUEplus Lancets 33G, HYDROcodone-acetaminophen, rOPINIRole, atorvastatin, escitalopram, fenofibrate, metoprolol succinate, Pfizer COVID-19 Vac Bivalent, dicyclomine, allopurinol, hydrochlorothiazide,  tamsulosin, and metFORMIN.  No orders of the defined types were placed in this encounter.

## 2022-05-04 ENCOUNTER — Ambulatory Visit (INDEPENDENT_AMBULATORY_CARE_PROVIDER_SITE_OTHER): Payer: Medicare HMO | Admitting: Family Medicine

## 2022-05-04 ENCOUNTER — Encounter: Payer: Self-pay | Admitting: Family Medicine

## 2022-05-04 VITALS — BP 124/84 | HR 56 | Resp 20 | Ht 71.0 in | Wt 205.4 lb

## 2022-05-04 DIAGNOSIS — E038 Other specified hypothyroidism: Secondary | ICD-10-CM

## 2022-05-04 DIAGNOSIS — K635 Polyp of colon: Secondary | ICD-10-CM

## 2022-05-04 DIAGNOSIS — E559 Vitamin D deficiency, unspecified: Secondary | ICD-10-CM

## 2022-05-04 DIAGNOSIS — Z1211 Encounter for screening for malignant neoplasm of colon: Secondary | ICD-10-CM | POA: Diagnosis not present

## 2022-05-04 DIAGNOSIS — I10 Essential (primary) hypertension: Secondary | ICD-10-CM | POA: Diagnosis not present

## 2022-05-04 DIAGNOSIS — N189 Chronic kidney disease, unspecified: Secondary | ICD-10-CM | POA: Diagnosis not present

## 2022-05-04 DIAGNOSIS — Z0000 Encounter for general adult medical examination without abnormal findings: Secondary | ICD-10-CM

## 2022-05-04 DIAGNOSIS — E7849 Other hyperlipidemia: Secondary | ICD-10-CM | POA: Diagnosis not present

## 2022-05-04 DIAGNOSIS — R972 Elevated prostate specific antigen [PSA]: Secondary | ICD-10-CM

## 2022-05-04 DIAGNOSIS — E119 Type 2 diabetes mellitus without complications: Secondary | ICD-10-CM

## 2022-05-04 LAB — LIPID PANEL
Cholesterol: 113 mg/dL (ref 0–200)
HDL: 34.3 mg/dL — ABNORMAL LOW (ref >39.00)
LDL Cholesterol: 44 mg/dL (ref 0–99)
NonHDL: 78.49
Total CHOL/HDL Ratio: 3
Triglycerides: 174 mg/dL — ABNORMAL HIGH (ref 0.0–149.0)
VLDL: 34.8 mg/dL (ref 0.0–40.0)

## 2022-05-04 LAB — COMPREHENSIVE METABOLIC PANEL
ALT: 31 U/L (ref 0–53)
AST: 33 U/L (ref 0–37)
Albumin: 4 g/dL (ref 3.5–5.2)
Alkaline Phosphatase: 43 U/L (ref 39–117)
BUN: 19 mg/dL (ref 6–23)
CO2: 27 mEq/L (ref 19–32)
Calcium: 9.8 mg/dL (ref 8.4–10.5)
Chloride: 101 mEq/L (ref 96–112)
Creatinine, Ser: 1.72 mg/dL — ABNORMAL HIGH (ref 0.40–1.50)
GFR: 37.92 mL/min — ABNORMAL LOW (ref >60.00)
Glucose, Bld: 131 mg/dL — ABNORMAL HIGH (ref 70–99)
Potassium: 4.1 mEq/L (ref 3.5–5.1)
Sodium: 137 mEq/L (ref 135–145)
Total Bilirubin: 0.5 mg/dL (ref 0.2–1.2)
Total Protein: 7.5 g/dL (ref 6.0–8.3)

## 2022-05-04 LAB — TSH: TSH: 4.92 u[IU]/mL (ref 0.35–5.50)

## 2022-05-04 LAB — CBC
HCT: 41.2 % (ref 39.0–52.0)
Hemoglobin: 13.4 g/dL (ref 13.0–17.0)
MCHC: 32.5 g/dL (ref 30.0–36.0)
MCV: 79 fl (ref 78.0–100.0)
Platelets: 220 10*3/uL (ref 150.0–400.0)
RBC: 5.21 Mil/uL (ref 4.22–5.81)
RDW: 16.2 % — ABNORMAL HIGH (ref 11.5–15.5)
WBC: 5.8 10*3/uL (ref 4.0–10.5)

## 2022-05-04 LAB — MICROALBUMIN / CREATININE URINE RATIO
Creatinine,U: 72.6 mg/dL
Microalb Creat Ratio: 1 mg/g (ref 0.0–30.0)
Microalb, Ur: <0.7 mg/dL (ref 0.0–1.9)

## 2022-05-04 LAB — HEMOGLOBIN A1C: Hgb A1c MFr Bld: 6.4 % (ref 4.6–6.5)

## 2022-05-04 NOTE — Assessment & Plan Note (Signed)
Encourage heart healthy diet such as MIND or DASH diet, increase exercise, avoid trans fats, simple carbohydrates and processed foods, consider a krill or fish or flaxseed oil cap daily.  °

## 2022-05-04 NOTE — Assessment & Plan Note (Signed)
hgba1c acceptable, minimize simple carbs. Increase exercise as tolerated. Continue current meds 

## 2022-05-04 NOTE — Assessment & Plan Note (Signed)
Following with urology, is on Tamulosin and

## 2022-05-04 NOTE — Assessment & Plan Note (Signed)
Patient encouraged to maintain heart healthy diet, regular exercise, adequate sleep. Consider daily probiotics. Take medications as prescribed. Labs ordered and reviewed 

## 2022-05-04 NOTE — Patient Instructions (Addendum)
Shingrix is the new shingles shot, 2 shots over 2-6 months, confirm coverage with insurance and document, then can return here for shots with nurse appt or at pharmacy  Prevnar 20 is the pneumonia shot   Preventive Care 43 Years and Older, Male Preventive care refers to lifestyle choices and visits with your health care provider that can promote health and wellness. Preventive care visits are also called wellness exams. What can I expect for my preventive care visit? Counseling During your preventive care visit, your health care provider may ask about your: Medical history, including: Past medical problems. Family medical history. History of falls. Current health, including: Emotional well-being. Home life and relationship well-being. Sexual activity. Memory and ability to understand (cognition). Lifestyle, including: Alcohol, nicotine or tobacco, and drug use. Access to firearms. Diet, exercise, and sleep habits. Work and work Statistician. Sunscreen use. Safety issues such as seatbelt and bike helmet use. Physical exam Your health care provider will check your: Height and weight. These may be used to calculate your BMI (body mass index). BMI is a measurement that tells if you are at a healthy weight. Waist circumference. This measures the distance around your waistline. This measurement also tells if you are at a healthy weight and may help predict your risk of certain diseases, such as type 2 diabetes and high blood pressure. Heart rate and blood pressure. Body temperature. Skin for abnormal spots. What immunizations do I need?  Vaccines are usually given at various ages, according to a schedule. Your health care provider will recommend vaccines for you based on your age, medical history, and lifestyle or other factors, such as travel or where you work. What tests do I need? Screening Your health care provider may recommend screening tests for certain conditions. This may  include: Lipid and cholesterol levels. Diabetes screening. This is done by checking your blood sugar (glucose) after you have not eaten for a while (fasting). Hepatitis C test. Hepatitis B test. HIV (human immunodeficiency virus) test. STI (sexually transmitted infection) testing, if you are at risk. Lung cancer screening. Colorectal cancer screening. Prostate cancer screening. Abdominal aortic aneurysm (AAA) screening. You may need this if you are a current or former smoker. Talk with your health care provider about your test results, treatment options, and if necessary, the need for more tests. Follow these instructions at home: Eating and drinking  Eat a diet that includes fresh fruits and vegetables, whole grains, lean protein, and low-fat dairy products. Limit your intake of foods with high amounts of sugar, saturated fats, and salt. Take vitamin and mineral supplements as recommended by your health care provider. Do not drink alcohol if your health care provider tells you not to drink. If you drink alcohol: Limit how much you have to 0-2 drinks a day. Know how much alcohol is in your drink. In the [place] one drink equals one 12 oz bottle of beer (355 mL), one 5 oz glass of wine (148 mL), or one 1 oz glass of hard liquor (44 mL). Lifestyle Brush your teeth every morning and night with fluoride toothpaste. Floss one time each day. Exercise for at least 30 minutes 5 or more days each week. Do not use any products that contain nicotine or tobacco. These products include cigarettes, chewing tobacco, and vaping devices, such as e-cigarettes. If you need help quitting, ask your health care provider. Do not use drugs. If you are sexually active, practice safe sex. Use a condom or other form of protection to prevent  STIs. Take aspirin only as told by your health care provider. Make sure that you understand how much to take and what form to take. Work with your health care provider to find out  whether it is safe and beneficial for you to take aspirin daily. Ask your health care provider if you need to take a cholesterol-lowering medicine (statin). Find healthy ways to manage stress, such as: Meditation, yoga, or listening to music. Journaling. Talking to a trusted person. Spending time with friends and family. Safety Always wear your seat belt while driving or riding in a vehicle. Do not drive: If you have been drinking alcohol. Do not ride with someone who has been drinking. When you are tired or distracted. While texting. If you have been using any mind-altering substances or drugs. Wear a helmet and other protective equipment during sports activities. If you have firearms in your house, make sure you follow all gun safety procedures. Minimize exposure to UV radiation to reduce your risk of skin cancer. What's next? Visit your health care provider once a year for an annual wellness visit. Ask your health care provider how often you should have your eyes and teeth checked. Stay up to date on all vaccines. This information is not intended to replace advice given to you by your health care provider. Make sure you discuss any questions you have with your health care provider. Document Revised: 05/18/2021 Document Reviewed: 05/18/2021 Elsevier Patient Education  [place].

## 2022-05-04 NOTE — Assessment & Plan Note (Signed)
Supplement and monitor 

## 2022-05-04 NOTE — Assessment & Plan Note (Signed)
Hydrate and monitor 

## 2022-05-04 NOTE — Assessment & Plan Note (Signed)
Well controlled, no changes to meds. Encouraged heart healthy diet such as the DASH diet and exercise as tolerated.  °

## 2022-05-17 ENCOUNTER — Encounter: Payer: Self-pay | Admitting: Gastroenterology

## 2022-05-31 DIAGNOSIS — H524 Presbyopia: Secondary | ICD-10-CM | POA: Diagnosis not present

## 2022-05-31 DIAGNOSIS — Z7984 Long term (current) use of oral hypoglycemic drugs: Secondary | ICD-10-CM | POA: Diagnosis not present

## 2022-05-31 DIAGNOSIS — H02831 Dermatochalasis of right upper eyelid: Secondary | ICD-10-CM | POA: Diagnosis not present

## 2022-05-31 DIAGNOSIS — H35033 Hypertensive retinopathy, bilateral: Secondary | ICD-10-CM | POA: Diagnosis not present

## 2022-05-31 DIAGNOSIS — H02834 Dermatochalasis of left upper eyelid: Secondary | ICD-10-CM | POA: Diagnosis not present

## 2022-05-31 DIAGNOSIS — H52203 Unspecified astigmatism, bilateral: Secondary | ICD-10-CM | POA: Diagnosis not present

## 2022-05-31 DIAGNOSIS — H2513 Age-related nuclear cataract, bilateral: Secondary | ICD-10-CM | POA: Diagnosis not present

## 2022-05-31 DIAGNOSIS — E119 Type 2 diabetes mellitus without complications: Secondary | ICD-10-CM | POA: Diagnosis not present

## 2022-05-31 DIAGNOSIS — H401131 Primary open-angle glaucoma, bilateral, mild stage: Secondary | ICD-10-CM | POA: Diagnosis not present

## 2022-05-31 LAB — HM DIABETES EYE EXAM

## 2022-06-05 ENCOUNTER — Other Ambulatory Visit: Payer: Medicare HMO

## 2022-06-27 ENCOUNTER — Ambulatory Visit: Payer: Medicare HMO | Admitting: Gastroenterology

## 2022-06-27 ENCOUNTER — Encounter: Payer: Self-pay | Admitting: Gastroenterology

## 2022-06-27 VITALS — BP 134/90 | HR 59 | Ht 71.0 in | Wt 207.0 lb

## 2022-06-27 DIAGNOSIS — Z85038 Personal history of other malignant neoplasm of large intestine: Secondary | ICD-10-CM

## 2022-06-27 NOTE — Progress Notes (Signed)
Review of pertinent gastrointestinal problems: 1.  History of colon cancer.  2003 transverse colon cancer, resected, adjuvant chemo; most recent colonsocopy Dr. Inocente Salles in 2008 found no polyps; Colonoscopy Dr. Ardis Hughs 2013 two subCM adenomas removed.  Colonoscopy 2018 Dr. Ardis Hughs 2 subcentimeter polyps were removed, one retrieved and it was a tubular adenoma.   HPI: This is a very pleasant 76 year old man  I last saw Daniel Gates about 5 years ago  Blood work 05/2022 CBC was normal.  He has really had no issues with his bowels apart from acute diarrheal illness about a month ago the lasted for a day.  He had associated dark stools after he started taking Pepto-Bismol.  His weight is overall stable, he has had no hematochezia.  No real changes in his bowels.  Colon cancer does not run in his family   ROS: complete GI ROS as described in HPI, all other review negative.  Constitutional:  No unintentional weight loss   Past Medical History:  Diagnosis Date   Allergic rhinitis    Anxiety 2003   "after cancer surgery"   Asthma    childhood per pt.   Bradycardia 10/01/2016   Cancer ([place])    colon; diagnosed 2003   Cataract 12/29/2016   not sure which eye   Chronic renal insufficiency 06/07/2017   Depression    Diabetes type 2, controlled ([place]) 06/27/2012   Glaucoma    Gout    Hyperlipidemia    Hypertension    Medicare annual wellness visit, subsequent 02/15/2014   Open-angle glaucoma 12/29/2016   Sickle cell anemia ([place]) 06/12/2017   "they say I have a trace"   TMJ arthralgia 01/02/2016    Past Surgical History:  Procedure Laterality Date   COLON SURGERY  2003   for colon cancer   COLONOSCOPY     INGUINAL HERNIA REPAIR  1997   right    Current Outpatient Medications  Medication Instructions   allopurinol (ZYLOPRIM) 100 MG tablet TAKE 1 TABLET EVERY DAY   aspirin 325 mg, Oral, Daily,     atorvastatin (LIPITOR) 40 mg, Oral, Daily at bedtime   Blood Glucose Monitoring Suppl  (TRUE METRIX AIR GLUCOSE METER) w/Device KIT Use to check blood sugar twice a day.  Dx code: E11.9   COD LIVER OIL PO 1 tablet, Oral, Daily,     COVID-19 mRNA bivalent vaccine, Pfizer, (PFIZER COVID-19 VAC BIVALENT) injection Intramuscular   dicyclomine (BENTYL) 20 MG tablet TAKE 1 TABLET TWICE DAILY   escitalopram (LEXAPRO) 10 mg, Oral, Daily   fenofibrate (TRICOR) 145 mg, Oral, Daily   Garlic Oil 0630 MG CAPS 1 capsule, Oral, Daily,     glucose blood (TRUE METRIX BLOOD GLUCOSE TEST) test strip Use to check blood sugar twice a day.  Dx code: E11.9   hydrochlorothiazide (MICROZIDE) 12.5 MG capsule TAKE 1 CAPSULE EVERY DAY   HYDROcodone-acetaminophen (NORCO) 10-325 MG tablet 1 tablet, Oral, 2 times daily PRN   latanoprost (XALATAN) 0.005 % ophthalmic solution Place 1 drop into both eyes nightly.   metFORMIN (GLUCOPHAGE) 500 MG tablet TAKE 1 TABLET TWICE DAILY WITH A MEAL   metoprolol succinate (TOPROL-XL) 25 MG 24 hr tablet TAKE 1 TABLET(25 MG) BY MOUTH DAILY   Multiple Vitamins-Minerals (ONE-A-DAY MENS HEALTH FORMULA) TABS 1 tablet, Daily   promethazine (PHENERGAN) 25 MG tablet TAKE ONE (1) TABLET(S) EVERY FOUR (4) HOURS AS NEEDED FOR NAUSEA   rOPINIRole (REQUIP) 1-2 mg, Oral, Daily at bedtime   tamsulosin (FLOMAX) 0.4 MG CAPS capsule  TAKE 1 CAPSULE EVERY DAY AFTER SUPPER   TRUEplus Lancets 33G MISC Use to check blood sugar twice a day.  Dx code: E11.9    Allergies as of 06/27/2022   (No Known Allergies)    Family History  Problem Relation Age of Onset   Diabetes Mother    Heart disease Mother        MI   Cancer Father        prostate   Prostate cancer Father    Dementia Sister    Thyroid disease Sister    Diabetes Sister    Heart disease Brother        CAD s/p CABG   Prostate cancer Brother    Colon cancer Neg Hx    Stomach cancer Neg Hx    Esophageal cancer Neg Hx    Rectal cancer Neg Hx     Social History   Socioeconomic History   Marital status: Widowed    Spouse  name: Not on file   Number of children: 7   Years of education: Not on file   Highest education level: Not on file  Occupational History    Employer: DISABLED  Tobacco Use   Smoking status: Never   Smokeless tobacco: Never  Vaping Use   Vaping Use: Never used  Substance and Sexual Activity   Alcohol use: No   Drug use: No   Sexual activity: Yes  Other Topics Concern   Not on file  Social History Narrative   Not on file   Social Determinants of Health   Financial Resource Strain: Low Risk  (07/07/2021)   Overall Financial Resource Strain (CARDIA)    Difficulty of Paying Living Expenses: Not hard at all  Food Insecurity: No Food Insecurity (07/07/2021)   Hunger Vital Sign    Worried About Running Out of Food in the Last Year: Never true    [place] in the Last Year: Never true  Transportation Needs: No Transportation Needs (07/07/2021)   PRAPARE - Hydrologist (Medical): No    Lack of Transportation (Non-Medical): No  Physical Activity: Inactive (07/07/2021)   Exercise Vital Sign    Days of Exercise per Week: 0 days    Minutes of Exercise per Session: 0 min  Stress: No Stress Concern Present (07/07/2021)   [place]    Feeling of Stress : Not at all  Social Connections: Moderately Isolated (07/07/2021)   Social Connection and Isolation Panel [NHANES]    Frequency of Communication with Friends and Family: More than three times a week    Frequency of Social Gatherings with Friends and Family: More than three times a week    Attends Religious Services: More than 4 times per year    Active Member of [employer] or Organizations: No    Attends Archivist Meetings: Never    Marital Status: Widowed  Intimate Partner Violence: Not At Risk (07/07/2021)   Humiliation, Afraid, Rape, and Kick questionnaire    Fear of Current or Ex-Partner: No    Emotionally Abused: No    Physically  Abused: No    Sexually Abused: No     Physical Exam: BP 134/90   Pulse (!) 59   Ht 5' 11"  (1.803 m)   Wt 207 lb (93.9 kg)   BMI 28.87 kg/m  Constitutional: generally well-appearing Psychiatric: alert and oriented x3 Abdomen: soft, nontender, nondistended, no obvious ascites, no peritoneal  signs, normal bowel sounds No peripheral edema noted in lower extremities  Assessment and plan: 76 y.o. male with personal history of colon cancer  We had a nice discussion about colon cancer screening.  He understands that these test generally stop sometime between the ages of 19 and 106 for most people.  He is 20 and is in quite good health and so he and I agree that colon cancer screening is still a reasonable clinical issue for him.  We will proceed with colonoscopy at his soonest convenience.  I see no reason for blood tests or imaging studies prior to then.  Please see the "Patient Instructions" section for addition details about the plan.  Daniel Loffler, MD [facility] 06/27/2022, 11:19 AM   Total time on date of encounter was 40 minutes (this included time spent preparing to see the patient reviewing records; obtaining and/or reviewing separately obtained history; performing a medically appropriate exam and/or evaluation; counseling and educating the patient and family if present; ordering medications, tests or procedures if applicable; and documenting clinical information in the health record).

## 2022-06-27 NOTE — Patient Instructions (Addendum)
If you are age 77 or older, your body mass index should be between 23-30. Your Body mass index is 28.87 kg/m. If this is out of the aforementioned range listed, please consider follow up with your Primary Care Provider. ________________________________________________________  The [facility] providers would like to encourage you to use [facility] to communicate with providers for non-urgent requests or questions.  Due to long hold times on the telephone, sending your provider a message by [facility] may be a faster and more efficient way to get a response.  Please allow 48 business hours for a response.  Please remember that this is for non-urgent requests.  _______________________________________________________  Dennis Bast have been scheduled for a colonoscopy. Please follow written instructions given to you at your visit today.  Please pick up your prep supplies at the pharmacy within the next 1-3 days. If you use inhalers (even only as needed), please bring them with you on the day of your procedure.  Due to recent changes in healthcare laws, you may see the results of your imaging and laboratory studies on MyChart before your provider has had a chance to review them.  We understand that in some cases there may be results that are confusing or concerning to you. Not all laboratory results come back in the same time frame and the provider may be waiting for multiple results in order to interpret others.  Please give [place] 48 hours in order for your provider to thoroughly review all the results before contacting the office for clarification of your results.   Thank you for entrusting me with your care and choosing [facility].  Dr Ardis Hughs

## 2022-07-01 ENCOUNTER — Other Ambulatory Visit: Payer: Self-pay | Admitting: Family Medicine

## 2022-07-11 ENCOUNTER — Ambulatory Visit (INDEPENDENT_AMBULATORY_CARE_PROVIDER_SITE_OTHER): Payer: Medicare HMO

## 2022-07-11 VITALS — BP 144/85 | HR 54 | Temp 97.8°F | Resp 16 | Ht 72.0 in | Wt 206.6 lb

## 2022-07-11 DIAGNOSIS — Z0000 Encounter for general adult medical examination without abnormal findings: Secondary | ICD-10-CM

## 2022-07-11 NOTE — Patient Instructions (Signed)
[name] , Thank you for taking time to come for your Medicare Wellness Visit. I appreciate your ongoing commitment to your health goals. Please review the following plan we discussed and let me know if I can assist you in the future.   Screening recommendations/referrals: Colonoscopy: scheduled 08/2022 Recommended yearly ophthalmology/optometry visit for glaucoma screening and checkup Recommended yearly dental visit for hygiene and checkup  Vaccinations: Influenza vaccine: declined Pneumococcal vaccine: declined Tdap vaccine: up to date Shingles vaccine: declined   Covid-19: completed  Advanced directives: no  Conditions/risks identified: see problem list   Next appointment: Follow up in one year for your annual wellness visit. 07/13/23  Preventive Care 65 Years and Older, Male Preventive care refers to lifestyle choices and visits with your health care provider that can promote health and wellness. What does preventive care include? A yearly physical exam. This is also called an annual well check. Dental exams once or twice a year. Routine eye exams. Ask your health care provider how often you should have your eyes checked. Personal lifestyle choices, including: Daily care of your teeth and gums. Regular physical activity. Eating a healthy diet. Avoiding tobacco and drug use. Limiting alcohol use. Practicing safe sex. Taking low doses of aspirin every day. Taking vitamin and mineral supplements as recommended by your health care provider. What happens during an annual well check? The services and screenings done by your health care provider during your annual well check will depend on your age, overall health, lifestyle risk factors, and family history of disease. Counseling  Your health care provider may ask you questions about your: Alcohol use. Tobacco use. Drug use. Emotional well-being. Home and relationship well-being. Sexual activity. Eating habits. History  of falls. Memory and ability to understand (cognition). Work and work Statistician. Screening  You may have the following tests or measurements: Height, weight, and BMI. Blood pressure. Lipid and cholesterol levels. These may be checked every 5 years, or more frequently if you are over 53 years old. Skin check. Lung cancer screening. You may have this screening every year starting at age 49 if you have a 30-pack-year history of smoking and currently smoke or have quit within the past 15 years. Fecal occult blood test (FOBT) of the stool. You may have this test every year starting at age 61. Flexible sigmoidoscopy or colonoscopy. You may have a sigmoidoscopy every 5 years or a colonoscopy every 10 years starting at age 32. Prostate cancer screening. Recommendations will vary depending on your family history and other risks. Hepatitis C blood test. Hepatitis B blood test. Sexually transmitted disease (STD) testing. Diabetes screening. This is done by checking your blood sugar (glucose) after you have not eaten for a while (fasting). You may have this done every 1-3 years. Abdominal aortic aneurysm (AAA) screening. You may need this if you are a current or former smoker. Osteoporosis. You may be screened starting at age 42 if you are at high risk. Talk with your health care provider about your test results, treatment options, and if necessary, the need for more tests. Vaccines  Your health care provider may recommend certain vaccines, such as: Influenza vaccine. This is recommended every year. Tetanus, diphtheria, and acellular pertussis (Tdap, Td) vaccine. You may need a Td booster every 10 years. Zoster vaccine. You may need this after age 50. Pneumococcal 13-valent conjugate (PCV13) vaccine. One dose is recommended after age 17. Pneumococcal polysaccharide (PPSV23) vaccine. One dose is recommended after age 42. Talk to your health care provider  about which screenings and vaccines you need  and how often you need them. This information is not intended to replace advice given to you by your health care provider. Make sure you discuss any questions you have with your health care provider. Document Released: 12/17/2015 Document Revised: 08/09/2016 Document Reviewed: 09/21/2015 Elsevier Interactive Patient Education  2017 [place] Prevention in the Home Falls can cause injuries. They can happen to people of all ages. There are many things you can do to make your home safe and to help prevent falls. What can I do on the outside of my home? Regularly fix the edges of walkways and driveways and fix any cracks. Remove anything that might make you trip as you walk through a door, such as a raised step or threshold. Trim any bushes or trees on the path to your home. Use bright outdoor lighting. Clear any walking paths of anything that might make someone trip, such as rocks or tools. Regularly check to see if handrails are loose or broken. Make sure that both sides of any steps have handrails. Any raised decks and porches should have guardrails on the edges. Have any leaves, snow, or ice cleared regularly. Use sand or salt on walking paths during winter. Clean up any spills in your garage right away. This includes oil or grease spills. What can I do in the bathroom? Use night lights. Install grab bars by the toilet and in the tub and shower. Do not use towel bars as grab bars. Use non-skid mats or decals in the tub or shower. If you need to sit down in the shower, use a plastic, non-slip stool. Keep the floor dry. Clean up any water that spills on the floor as soon as it happens. Remove soap buildup in the tub or shower regularly. Attach bath mats securely with double-sided non-slip rug tape. Do not have throw rugs and other things on the floor that can make you trip. What can I do in the bedroom? Use night lights. Make sure that you have a light by your bed that is easy  to reach. Do not use any sheets or blankets that are too big for your bed. They should not hang down onto the floor. Have a firm chair that has side arms. You can use this for support while you get dressed. Do not have throw rugs and other things on the floor that can make you trip. What can I do in the kitchen? Clean up any spills right away. Avoid walking on wet floors. Keep items that you use a lot in easy-to-reach places. If you need to reach something above you, use a strong step stool that has a grab bar. Keep electrical cords out of the way. Do not use floor polish or wax that makes floors slippery. If you must use wax, use non-skid floor wax. Do not have throw rugs and other things on the floor that can make you trip. What can I do with my stairs? Do not leave any items on the stairs. Make sure that there are handrails on both sides of the stairs and use them. Fix handrails that are broken or loose. Make sure that handrails are as long as the stairways. Check any carpeting to make sure that it is firmly attached to the stairs. Fix any carpet that is loose or worn. Avoid having throw rugs at the top or bottom of the stairs. If you do have throw rugs, attach them to the floor  with carpet tape. Make sure that you have a light switch at the top of the stairs and the bottom of the stairs. If you do not have them, ask someone to add them for you. What else can I do to help prevent falls? Wear shoes that: Do not have high heels. Have rubber bottoms. Are comfortable and fit you well. Are closed at the toe. Do not wear sandals. If you use a stepladder: Make sure that it is fully opened. Do not climb a closed stepladder. Make sure that both sides of the stepladder are locked into place. Ask someone to hold it for you, if possible. Clearly mark and make sure that you can see: Any grab bars or handrails. First and last steps. Where the edge of each step is. Use tools that help you move  around (mobility aids) if they are needed. These include: Canes. Walkers. Scooters. Crutches. Turn on the lights when you go into a dark area. Replace any light bulbs as soon as they burn out. Set up your furniture so you have a clear path. Avoid moving your furniture around. If any of your floors are uneven, fix them. If there are any pets around you, be aware of where they are. Review your medicines with your doctor. Some medicines can make you feel dizzy. This can increase your chance of falling. Ask your doctor what other things that you can do to help prevent falls. This information is not intended to replace advice given to you by your health care provider. Make sure you discuss any questions you have with your health care provider. Document Released: 09/16/2009 Document Revised: 04/27/2016 Document Reviewed: 12/25/2014 Elsevier Interactive Patient Education  2017 [employer].

## 2022-07-11 NOTE — Progress Notes (Signed)
Subjective:   Daniel Gates is a 76 y.o. male who presents for Medicare Annual/Subsequent preventive examination.  Review of Systems           Objective:    There were no vitals filed for this visit. There is no height or weight on file to calculate BMI.     07/07/2021    1:06 PM 07/01/2020   11:10 AM 01/27/2020    9:23 AM 06/30/2019   10:08 AM 06/27/2018   10:21 AM 08/07/2017    9:40 AM 06/26/2017   10:19 AM  Advanced Directives  Does Patient Have a Medical Advance Directive? No No No No No No No  Does patient want to make changes to medical advance directive?     Yes (MAU/Ambulatory/Procedural Areas - Information given)    Would patient like information on creating a medical advance directive? No - Patient declined No - Patient declined No - Patient declined No - Patient declined  No - Patient declined Yes (MAU/Ambulatory/Procedural Areas - Information given)    Current Medications (verified) Outpatient Encounter Medications as of 07/11/2022  Medication Sig   allopurinol (ZYLOPRIM) 100 MG tablet TAKE 1 TABLET EVERY DAY   aspirin 325 MG tablet Take 325 mg by mouth daily.   atorvastatin (LIPITOR) 40 MG tablet TAKE 1 TABLET AT BEDTIME   Blood Glucose Monitoring Suppl (TRUE METRIX AIR GLUCOSE METER) w/Device KIT Use to check blood sugar twice a day.  Dx code: E11.9   COD LIVER OIL PO Take 1 tablet by mouth daily.   COVID-19 mRNA bivalent vaccine, Pfizer, (PFIZER COVID-19 VAC BIVALENT) injection Inject into the muscle.   dicyclomine (BENTYL) 20 MG tablet TAKE 1 TABLET TWICE DAILY   escitalopram (LEXAPRO) 10 MG tablet Take 1 tablet (10 mg total) by mouth daily.   fenofibrate (TRICOR) 145 MG tablet TAKE 1 TABLET EVERY DAY   Garlic Oil 0973 MG CAPS Take 1 capsule by mouth daily.   glucose blood (TRUE METRIX BLOOD GLUCOSE TEST) test strip Use to check blood sugar twice a day.  Dx code: E11.9   hydrochlorothiazide (MICROZIDE) 12.5 MG capsule TAKE 1 CAPSULE EVERY DAY    HYDROcodone-acetaminophen (NORCO) 10-325 MG tablet Take 1 tablet by mouth 2 (two) times daily as needed.   latanoprost (XALATAN) 0.005 % ophthalmic solution Place 1 drop into both eyes nightly.   metFORMIN (GLUCOPHAGE) 500 MG tablet TAKE 1 TABLET TWICE DAILY WITH A MEAL   metoprolol succinate (TOPROL-XL) 25 MG 24 hr tablet TAKE 1 TABLET EVERY DAY   Multiple Vitamins-Minerals (ONE-A-DAY MENS HEALTH FORMULA) TABS Take 1 tablet by mouth daily.   promethazine (PHENERGAN) 25 MG tablet TAKE ONE (1) TABLET(S) EVERY FOUR (4) HOURS AS NEEDED FOR NAUSEA   rOPINIRole (REQUIP) 1 MG tablet Take 1-2 tablets (1-2 mg total) by mouth at bedtime.   tamsulosin (FLOMAX) 0.4 MG CAPS capsule TAKE 1 CAPSULE EVERY DAY AFTER SUPPER   TRUEplus Lancets 33G MISC Use to check blood sugar twice a day.  Dx code: E11.9   No facility-administered encounter medications on file as of 07/11/2022.    Allergies (verified) Patient has no known allergies.   History: Past Medical History:  Diagnosis Date   Allergic rhinitis    Anxiety 2003   "after cancer surgery"   Asthma    childhood per pt.   Bradycardia 10/01/2016   Cancer ([place])    colon; diagnosed 2003   Cataract 12/29/2016   not sure which eye   Chronic renal insufficiency 06/07/2017  Depression    Diabetes type 2, controlled ([place]) 06/27/2012   Glaucoma    Gout    Hyperlipidemia    Hypertension    Medicare annual wellness visit, subsequent 02/15/2014   Open-angle glaucoma 12/29/2016   Sickle cell anemia ([place]) 06/12/2017   "they say I have a trace"   TMJ arthralgia 01/02/2016   Past Surgical History:  Procedure Laterality Date   COLON SURGERY  2003   for colon cancer   COLONOSCOPY     INGUINAL HERNIA REPAIR  1997   right   Family History  Problem Relation Age of Onset   Diabetes Mother    Heart disease Mother        MI   Cancer Father        prostate   Prostate cancer Father    Dementia Sister    Thyroid disease Sister    Diabetes Sister    Heart  disease Brother        CAD s/p CABG   Prostate cancer Brother    Colon cancer Neg Hx    Stomach cancer Neg Hx    Esophageal cancer Neg Hx    Rectal cancer Neg Hx    Social History   Socioeconomic History   Marital status: Widowed    Spouse name: Not on file   Number of children: 7   Years of education: Not on file   Highest education level: Not on file  Occupational History    Employer: DISABLED  Tobacco Use   Smoking status: Never   Smokeless tobacco: Never  Vaping Use   Vaping Use: Never used  Substance and Sexual Activity   Alcohol use: No   Drug use: No   Sexual activity: Yes  Other Topics Concern   Not on file  Social History Narrative   Not on file   Social Determinants of Health   Financial Resource Strain: Low Risk  (07/07/2021)   Overall Financial Resource Strain (CARDIA)    Difficulty of Paying Living Expenses: Not hard at all  Food Insecurity: No Food Insecurity (07/07/2021)   Hunger Vital Sign    Worried About Running Out of Food in the Last Year: Never true    [place] in the Last Year: Never true  Transportation Needs: No Transportation Needs (07/07/2021)   PRAPARE - Hydrologist (Medical): No    Lack of Transportation (Non-Medical): No  Physical Activity: Inactive (07/07/2021)   Exercise Vital Sign    Days of Exercise per Week: 0 days    Minutes of Exercise per Session: 0 min  Stress: No Stress Concern Present (07/07/2021)   [place]    Feeling of Stress : Not at all  Social Connections: Moderately Isolated (07/07/2021)   Social Connection and Isolation Panel [NHANES]    Frequency of Communication with Friends and Family: More than three times a week    Frequency of Social Gatherings with Friends and Family: More than three times a week    Attends Religious Services: More than 4 times per year    Active Member of [employer] or Organizations: No    Attends English as a second language teacher Meetings: Never    Marital Status: Widowed    Tobacco Counseling Counseling given: Not Answered   Clinical Intake:                 Diabetic?yes Nutrition Risk Assessment:  Has the patient had  any N/V/D within the last 2 months?  Yes  Does the patient have any non-healing wounds?  No  Has the patient had any unintentional weight loss or weight gain?  No   Diabetes:  Is the patient diabetic?  Yes  If diabetic, was a CBG obtained today?  No  Did the patient bring in their glucometer from home?  No  How often do you monitor your CBG's? Twice daily.   Financial Strains and Diabetes Management:  Are you having any financial strains with the device, your supplies or your medication? No .  Does the patient want to be seen by Chronic Care Management for management of their diabetes?  No  Would the patient like to be referred to a Nutritionist or for Diabetic Management?  No   Diabetic Exams:  Diabetic Eye Exam: Overdue for diabetic eye exam. Pt has been advised about the importance in completing this exam. Patient advised to call and schedule an eye exam. Diabetic Foot Exam: Overdue, Pt has been advised about the importance in completing this exam. Pt is scheduled for diabetic foot exam on n/a.          Activities of Daily Living     No data to display          Patient Care Team: [name], MD as PCP - General (Family Medicine) Tora Kindred Marily Lente, MD as Consulting Physician (Ophthalmology)  Indicate any recent Medical Services you may have received from other than [facility] providers in the past year (date may be approximate).     Assessment:   This is a routine wellness examination for Daniel Gates.  Hearing/Vision screen No results found.  Dietary issues and exercise activities discussed:     Goals Addressed   None    Depression Screen    05/04/2022   10:27 AM 07/07/2021    1:09 PM 04/18/2021    2:29 PM 07/01/2020   11:15 AM 06/30/2019    10:09 AM 06/27/2018   10:22 AM 06/26/2017   10:19 AM  PHQ 2/9 Scores  PHQ - 2 Score 0 1 2 0 0 0 0  PHQ- 9 Score   2        Fall Risk    05/04/2022   10:26 AM 07/07/2021    1:08 PM 04/18/2021    2:29 PM 07/01/2020   11:14 AM 06/30/2019   10:09 AM  [place] in the past year? 0 0 1 1 0  Comment    tripped while mowing the yard   Number falls in past yr: 0 0 0 0   Injury with Fall? 0 0 0 0   Risk for fall due to : No Fall Risks      Follow up Falls evaluation completed Falls prevention discussed  Education provided;Falls prevention discussed     FALL RISK PREVENTION PERTAINING TO THE HOME:  Any stairs in or around the home? No  If so, are there any without handrails?  N/a Home free of loose throw rugs in walkways, pet beds, electrical cords, etc? Yes  Adequate lighting in your home to reduce risk of falls? Yes   ASSISTIVE DEVICES UTILIZED TO PREVENT FALLS:  Life alert? Yes  Use of a cane, walker or w/c? No  Grab bars in the bathroom? No  Shower chair or bench in shower? No  Elevated toilet seat or a handicapped toilet? No   TIMED UP AND GO:  Was the test performed? Yes .  Length of time to ambulate 10 feet: 10 sec.   Gait steady and fast without use of assistive device  Cognitive Function:    06/27/2018   10:23 AM 06/26/2017   10:20 AM 06/23/2016   10:07 AM  MMSE - Mini Mental State Exam  Orientation to time 5 5 5   Orientation to Place 5 5 5   Registration 3 3 3   Attention/ Calculation 5 5 5   Recall 2 2 2   Language- name 2 objects 2 2 2   Language- repeat 1 1 1   Language- follow 3 step command 3 3 3   Language- read & follow direction 1 1 1   Write a sentence 1 1 1   Copy design 1 1 1   Total score 29 29 29         07/07/2021    1:18 PM 07/01/2020   11:15 AM  6CIT Screen  What Year? 0 points 0 points  What month? 0 points 0 points  What time? 0 points 0 points  Count back from 20 0 points 0 points  Months in reverse 2 points 2 points  Repeat phrase 0 points  0 points  Total Score 2 points 2 points    Immunizations Immunization History  Administered Date(s) Administered   PFIZER(Purple Top)SARS-COV-2 Vaccination 02/01/2020, 03/10/2020, 12/28/2020   Pfizer Covid-19 Vaccine Bivalent Booster 74yr & up 10/24/2021   Td 12/04/1997, 07/14/2019   Tdap 01/30/2012    TDAP status: Up to date  Flu Vaccine status: Declined, Education has been provided regarding the importance of this vaccine but patient still declined. Advised may receive this vaccine at local pharmacy or Health Dept. Aware to provide a copy of the vaccination record if obtained from local pharmacy or Health Dept. Verbalized acceptance and understanding.  Pneumococcal vaccine status: Declined,  Education has been provided regarding the importance of this vaccine but patient still declined. Advised may receive this vaccine at local pharmacy or Health Dept. Aware to provide a copy of the vaccination record if obtained from local pharmacy or Health Dept. Verbalized acceptance and understanding.   Covid-19 vaccine status: Completed vaccines  Qualifies for Shingles Vaccine? Yes   Zostavax completed No   Shingrix Completed?: No.    Education has been provided regarding the importance of this vaccine. Patient has been advised to call insurance company to determine out of pocket expense if they have not yet received this vaccine. Advised may also receive vaccine at local pharmacy or Health Dept. Verbalized acceptance and understanding.  Screening Tests Health Maintenance  Topic Date Due   Zoster Vaccines- Shingrix (1 of 2) Never done   Pneumonia Vaccine 76 Years old (1 - PCV) Never done   FOOT EXAM  05/18/2017   OPHTHALMOLOGY EXAM  05/25/2022   COLONOSCOPY (Pts [id number] coverage will need to be confirmed)  06/12/2022   HEMOGLOBIN A1C  11/03/2022   URINE MICROALBUMIN  05/05/2023   TETANUS/TDAP  07/13/2029   COVID-19 Vaccine  Completed   Hepatitis C Screening  Completed   HPV  VACCINES  Aged Out   INFLUENZA VACCINE  Discontinued    Health Maintenance  Health Maintenance Due  Topic Date Due   Zoster Vaccines- Shingrix (1 of 2) Never done   Pneumonia Vaccine [age]Years old (1 - PCV) Never done   FOOT EXAM  05/18/2017   OPHTHALMOLOGY EXAM  05/25/2022   COLONOSCOPY (Pts [id number] coverage will need to be confirmed)  06/12/2022    Colorectal cancer screening: Referral to GI placed scheduled for 08/2022.  Pt aware the office will call re: appt.  Lung Cancer Screening: (Low Dose CT Chest recommended if Age 71-80 years, 30 pack-year currently smoking OR have quit w/in 15years.) does not qualify.   Lung Cancer Screening Referral: n/a  Additional Screening:  Hepatitis C Screening: does qualify; Completed 12/24/15  Vision Screening: Recommended annual ophthalmology exams for early detection of glaucoma and other disorders of the eye. Is the patient up to date with their annual eye exam?  Yes  Who is the provider or what is the name of the office in which the patient attends annual eye exams? [facility] If pt is not established with a provider, would they like to be referred to a provider to establish care? No .   Dental Screening: Recommended annual dental exams for proper oral hygiene  Community Resource Referral / Chronic Care Management: CRR required this visit?  No   CCM required this visit?  No      Plan:     I have personally reviewed and noted the following in the patient's chart:   Medical and social history Use of alcohol, tobacco or illicit drugs  Current medications and supplements including opioid prescriptions. Patient is currently taking opioid prescriptions. Information provided to patient regarding non-opioid alternatives. Patient advised to discuss non-opioid treatment plan with their provider. Functional ability and status Nutritional status Physical activity Advanced directives List of other physicians Hospitalizations,  surgeries, and ER visits in previous 12 months Vitals Screenings to include cognitive, depression, and falls Referrals and appointments  In addition, I have reviewed and discussed with patient certain preventive protocols, quality metrics, and best practice recommendations. A written personalized care plan for preventive services as well as general preventive health recommendations were provided to patient.     Duard Brady Ileane Sando, [place]   07/11/2022   Nurse Notes: none

## 2022-07-13 NOTE — Progress Notes (Signed)
Subjective:   Daniel Gates is a 76 y.o. male who presents for Medicare Annual/Subsequent preventive examination.  Review of Systems     Cardiac Risk Factors include: advanced age (>51mn, >>69women);hypertension;diabetes mellitus;dyslipidemia;male gender     Objective:    Today's Vitals   07/11/22 1346  BP: (!) 144/85  Pulse: (!) 54  Resp: 16  Temp: 97.8 F (36.6 C)  SpO2: 95%  Weight: 206 lb 9.6 oz (93.7 kg)  Height: 6' (1.829 m)   Body mass index is 28.02 kg/m.     07/11/2022    1:49 PM 07/07/2021    1:06 PM 07/01/2020   11:10 AM 01/27/2020    9:23 AM 06/30/2019   10:08 AM 06/27/2018   10:21 AM 08/07/2017    9:40 AM  Advanced Directives  Does Patient Have a Medical Advance Directive? _0  No No  Does patient want to make changes to medical advance directive?      Yes (MAU/Ambulatory/Procedural Areas - Information given)   Would patient like information on creating a medical advance directive? No - Patient declined No - Patient declined No - Patient declined No - Patient declined No - Patient declined  No - Patient declined    Current Medications (verified) Outpatient Encounter Medications as of 07/11/2022  Medication Sig   allopurinol (ZYLOPRIM) 100 MG tablet TAKE 1 TABLET EVERY DAY   aspirin 325 MG tablet Take 325 mg by mouth daily.   atorvastatin (LIPITOR) 40 MG tablet TAKE 1 TABLET AT BEDTIME   Blood Glucose Monitoring Suppl (TRUE METRIX AIR GLUCOSE METER) w/Device KIT Use to check blood sugar twice a day.  Dx code: E11.9   COD LIVER OIL PO Take 1 tablet by mouth daily.   COVID-19 mRNA bivalent vaccine, Pfizer, (PFIZER COVID-19 VAC BIVALENT) injection Inject into the muscle.   dicyclomine (BENTYL) 20 MG tablet TAKE 1 TABLET TWICE DAILY   escitalopram (LEXAPRO) 10 MG tablet Take 1 tablet (10 mg total) by mouth daily.   fenofibrate (TRICOR) 145 MG tablet TAKE 1 TABLET EVERY DAY   Garlic Oil 13354MG CAPS Take 1 capsule by mouth daily.   glucose blood (TRUE  METRIX BLOOD GLUCOSE TEST) test strip Use to check blood sugar twice a day.  Dx code: E11.9   hydrochlorothiazide (MICROZIDE) 12.5 MG capsule TAKE 1 CAPSULE EVERY DAY   HYDROcodone-acetaminophen (NORCO) 10-325 MG tablet Take 1 tablet by mouth 2 (two) times daily as needed.   latanoprost (XALATAN) 0.005 % ophthalmic solution Place 1 drop into both eyes nightly.   metFORMIN (GLUCOPHAGE) 500 MG tablet TAKE 1 TABLET TWICE DAILY WITH A MEAL   metoprolol succinate (TOPROL-XL) 25 MG 24 hr tablet TAKE 1 TABLET EVERY DAY   Multiple Vitamins-Minerals (ONE-A-DAY MENS HEALTH FORMULA) TABS Take 1 tablet by mouth daily.   promethazine (PHENERGAN) 25 MG tablet TAKE ONE (1) TABLET(S) EVERY FOUR (4) HOURS AS NEEDED FOR NAUSEA   rOPINIRole (REQUIP) 1 MG tablet Take 1-2 tablets (1-2 mg total) by mouth at bedtime.   tamsulosin (FLOMAX) 0.4 MG CAPS capsule TAKE 1 CAPSULE EVERY DAY AFTER SUPPER   TRUEplus Lancets 33G MISC Use to check blood sugar twice a day.  Dx code: E11.9   No facility-administered encounter medications on file as of 07/11/2022.    Allergies (verified) Patient has no known allergies.   History: Past Medical History:  Diagnosis Date   Allergic rhinitis    Anxiety 2003   "after cancer surgery"   Asthma  childhood per pt.   Bradycardia 10/01/2016   Cancer ([place])    colon; diagnosed 2003   Cataract 12/29/2016   not sure which eye   Chronic renal insufficiency 06/07/2017   Depression    Diabetes type 2, controlled ([place]) 06/27/2012   Glaucoma    Gout    Hyperlipidemia    Hypertension    Medicare annual wellness visit, subsequent 02/15/2014   Open-angle glaucoma 12/29/2016   Sickle cell anemia ([place]) 06/12/2017   "they say I have a trace"   TMJ arthralgia 01/02/2016   Past Surgical History:  Procedure Laterality Date   COLON SURGERY  2003   for colon cancer   COLONOSCOPY     INGUINAL HERNIA REPAIR  1997   right   Family History  Problem Relation Age of Onset   Diabetes Mother     Heart disease Mother        MI   Cancer Father        prostate   Prostate cancer Father    Dementia Sister    Thyroid disease Sister    Diabetes Sister    Heart disease Brother        CAD s/p CABG   Prostate cancer Brother    Colon cancer Neg Hx    Stomach cancer Neg Hx    Esophageal cancer Neg Hx    Rectal cancer Neg Hx    Social History   Socioeconomic History   Marital status: Widowed    Spouse name: Not on file   Number of children: 7   Years of education: Not on file   Highest education level: Not on file  Occupational History    Employer: DISABLED  Tobacco Use   Smoking status: Never   Smokeless tobacco: Never  Vaping Use   Vaping Use: Never used  Substance and Sexual Activity   Alcohol use: No   Drug use: No   Sexual activity: Yes  Other Topics Concern   Not on file  Social History Narrative   Not on file   Social Determinants of Health   Financial Resource Strain: Low Risk  (07/07/2021)   Overall Financial Resource Strain (CARDIA)    Difficulty of Paying Living Expenses: Not hard at all  Food Insecurity: No Food Insecurity (07/07/2021)   Hunger Vital Sign    Worried About Running Out of Food in the Last Year: Never true    [place] in the Last Year: Never true  Transportation Needs: No Transportation Needs (07/07/2021)   PRAPARE - Hydrologist (Medical): No    Lack of Transportation (Non-Medical): No  Physical Activity: Inactive (07/07/2021)   Exercise Vital Sign    Days of Exercise per Week: 0 days    Minutes of Exercise per Session: 0 min  Stress: No Stress Concern Present (07/07/2021)   Deaf [place]    Feeling of Stress : Not at all  Social Connections: Moderately Isolated (07/07/2021)   Social Connection and Isolation Panel [NHANES]    Frequency of Communication with Friends and Family: More than three times a week    Frequency of Social Gatherings with  Friends and Family: More than three times a week    Attends Religious Services: More than 4 times per year    Active Member of [employer] or Organizations: No    Attends Archivist Meetings: Never    Marital Status: Widowed  Tobacco Counseling Counseling given: Not Answered   Clinical Intake:  Pre-visit preparation completed: Yes  Pain : No/denies pain     BMI - recorded: 28.02 Nutritional Status: BMI 25 -29 Overweight Nutritional Risks: Nausea/ vomitting/ diarrhea Diabetes: Yes CBG done?: No Did pt. bring in CBG monitor from home?: No  How often do you need to have someone help you when you read instructions, pamphlets, or other written materials from your doctor or pharmacy?: 1 - Never  Diabetic?yes Nutrition Risk Assessment:  Has the patient had any N/V/D within the last 2 months?  Yes  Does the patient have any non-healing wounds?  No  Has the patient had any unintentional weight loss or weight gain?  No   Diabetes:  Is the patient diabetic?  Yes  If diabetic, was a CBG obtained today?  No  Did the patient bring in their glucometer from home?  No  How often do you monitor your CBG's? Twice daily.   Financial Strains and Diabetes Management:  Are you having any financial strains with the device, your supplies or your medication? No .  Does the patient want to be seen by Chronic Care Management for management of their diabetes?  No  Would the patient like to be referred to a Nutritionist or for Diabetic Management?  No   Diabetic Exams:  Diabetic Eye Exam: Overdue for diabetic eye exam. Pt has been advised about the importance in completing this exam. Patient advised to call and schedule an eye exam. Diabetic Foot Exam: Overdue, Pt has been advised about the importance in completing this exam. Pt is scheduled for diabetic foot exam on n/a.   Interpreter Needed?: No  Information entered by :: Comfort of Daily Living    07/11/2022     1:53 PM  In your present state of health, do you have any difficulty performing the following activities:  Hearing? 0  Vision? 1  Difficulty concentrating or making decisions? 0  Walking or climbing stairs? 0  Dressing or bathing? 0  Doing errands, shopping? 0  Preparing Food and eating ? N  Using the Toilet? N  In the past six months, have you accidently leaked urine? Y  Do you have problems with loss of bowel control? Y  Managing your Medications? N  Managing your Finances? N  Housekeeping or managing your Housekeeping? N    Patient Care Team: [name], MD as PCP - General (Family Medicine) Linward Natal, MD as Consulting Physician (Ophthalmology)  Indicate any recent Medical Services you may have received from other than [facility] providers in the past year (date may be approximate).     Assessment:   This is a routine wellness examination for Jolon.  Hearing/Vision screen No results found.  Dietary issues and exercise activities discussed: Current Exercise Habits: The patient does not participate in regular exercise at present, Exercise limited by: None identified   Goals Addressed             This Visit's Progress    Decrease soda or juice intake   On track    Eat more fruits and vegetables   On track    Increase physical activity   Not on track    Walk at least 3 times per week       Depression Screen    07/11/2022    1:50 PM 05/04/2022   10:27 AM 07/07/2021    1:09 PM 04/18/2021    2:29 PM 07/01/2020  11:15 AM 06/30/2019   10:09 AM 06/27/2018   10:22 AM  PHQ 2/9 Scores  PHQ - 2 Score 0 0 1 2 0 0 0  PHQ- 9 Score    2       Fall Risk    07/11/2022    1:49 PM 05/04/2022   10:26 AM 07/07/2021    1:08 PM 04/18/2021    2:29 PM 07/01/2020   11:14 AM  Fall Risk   Falls in the past year? 1 0 0 1 1  Comment     tripped while mowing the yard  Number falls in past yr: 1 0 0 0 0  Injury with Fall? 0 0 0 0 0  Risk for fall due to : History of fall(s) No  Fall Risks     Follow up Falls evaluation completed Falls evaluation completed Falls prevention discussed  Education provided;Falls prevention discussed    FALL RISK PREVENTION PERTAINING TO THE HOME:  Any stairs in or around the home? No  If so, are there any without handrails?  N/a Home free of loose throw rugs in walkways, pet beds, electrical cords, etc? Yes  Adequate lighting in your home to reduce risk of falls? Yes   ASSISTIVE DEVICES UTILIZED TO PREVENT FALLS:  Life alert? Yes  Use of a cane, walker or w/c? No  Grab bars in the bathroom? No  Shower chair or bench in shower? No  Elevated toilet seat or a handicapped toilet? No   TIMED UP AND GO:  Was the test performed? Yes .  Length of time to ambulate 10 feet: 10 sec.   Gait steady and fast without use of assistive device  Cognitive Function:    06/27/2018   10:23 AM 06/26/2017   10:20 AM 06/23/2016   10:07 AM  MMSE - Mini Mental State Exam  Orientation to time _0 Orientation to Place _1 Registration _2 Attention/ Calculation _3 Recall _4 Language- name 2 objects _5 Language- repeat _6 Language- follow 3 step command _7 Language- read & follow direction _8 Write a sentence _9 Copy design _10 Total score _11 07/11/2022    1:56 PM 07/07/2021    1:18 PM 07/01/2020   11:15 AM  6CIT Screen  What Year? 0 points 0 points 0 points  What month? 0 points 0 points 0 points  What time? 0 points 0 points 0 points  Count back from 20 0 points 0 points 0 points  Months in reverse 4 points 2 points 2 points  Repeat phrase 2 points 0 points 0 points  Total Score 6 points 2 points 2 points    Immunizations Immunization History  Administered Date(s) Administered   PFIZER(Purple Top)SARS-COV-2 Vaccination 02/01/2020, 03/10/2020, 12/28/2020   Pfizer Covid-19 Vaccine Bivalent Booster 54yr & up 10/24/2021   Td 12/04/1997, 07/14/2019   Tdap 01/30/2012    TDAP  status: Up to date  Flu Vaccine status: Declined, Education has been provided regarding the importance of this vaccine but patient still declined. Advised may receive this vaccine at local pharmacy or Health Dept. Aware to provide a copy of the vaccination record if obtained from local pharmacy or Health Dept. Verbalized acceptance and understanding.  Pneumococcal vaccine status: Declined,  Education has been provided regarding  the importance of this vaccine but patient still declined. Advised may receive this vaccine at local pharmacy or Health Dept. Aware to provide a copy of the vaccination record if obtained from local pharmacy or Health Dept. Verbalized acceptance and understanding.   Covid-19 vaccine status: Completed vaccines  Qualifies for Shingles Vaccine? Yes   Zostavax completed No   Shingrix Completed?: No.    Education has been provided regarding the importance of this vaccine. Patient has been advised to call insurance company to determine out of pocket expense if they have not yet received this vaccine. Advised may also receive vaccine at local pharmacy or Health Dept. Verbalized acceptance and understanding.  Screening Tests Health Maintenance  Topic Date Due   Zoster Vaccines- Shingrix (1 of 2) Never done   Pneumonia Vaccine 14+ Years old (1 - PCV) Never done   FOOT EXAM  05/18/2017   COVID-19 Vaccine (5 - Pfizer risk series) 12/19/2021   OPHTHALMOLOGY EXAM  05/25/2022   COLONOSCOPY (Pts 45-27yr Insurance coverage will need to be confirmed)  06/12/2022   HEMOGLOBIN A1C  11/03/2022   URINE MICROALBUMIN  05/05/2023   TETANUS/TDAP  07/13/2029   Hepatitis C Screening  Completed   HPV VACCINES  Aged Out   INFLUENZA VACCINE  Discontinued    Health Maintenance  Health Maintenance Due  Topic Date Due   Zoster Vaccines- Shingrix (1 of 2) Never done   Pneumonia Vaccine 77 Years old (1 - PCV) Never done   FOOT EXAM  05/18/2017   COVID-19 Vaccine (5 - Pfizer risk series)  12/19/2021   OPHTHALMOLOGY EXAM  05/25/2022   COLONOSCOPY (Pts 45-464yrInsurance coverage will need to be confirmed)  06/12/2022    Colorectal cancer screening: Referral to GI placed scheduled for 08/2022. Pt aware the office will call re: appt.  Lung Cancer Screening: (Low Dose CT Chest recommended if Age 77-80ears, 30 pack-year currently smoking OR have quit w/in 15years.) does not qualify.   Lung Cancer Screening Referral: n/a  Additional Screening:  Hepatitis C Screening: does qualify; Completed 12/24/15  Vision Screening: Recommended annual ophthalmology exams for early detection of glaucoma and other disorders of the eye. Is the patient up to date with their annual eye exam?  Yes  Who is the provider or what is the name of the office in which the patient attends annual eye exams? [facility] is not established with a provider, would they like to be referred to a provider to establish care? No .   Dental Screening: Recommended annual dental exams for proper oral hygiene  Community Resource Referral / Chronic Care Management: CRR required this visit?  No   CCM required this visit?  No      Plan:     I have personally reviewed and noted the following in the patient's chart:   Medical and social history Use of alcohol, tobacco or illicit drugs  Current medications and supplements including opioid prescriptions. Patient is currently taking opioid prescriptions. Information provided to patient regarding non-opioid alternatives. Patient advised to discuss non-opioid treatment plan with their provider. Functional ability and status Nutritional status Physical activity Advanced directives List of other physicians Hospitalizations, surgeries, and ER visits in previous 12 months Vitals Screenings to include cognitive, depression, and falls Referrals and appointments  In addition, I have reviewed and discussed with patient certain preventive protocols, quality metrics,  and best practice recommendations. A written personalized care plan for preventive services as well as general preventive health recommendations were  provided to patient.     Duard Brady Forrest Jaroszewski, [place]   07/13/2022   Nurse Notes: none

## 2022-08-02 ENCOUNTER — Other Ambulatory Visit: Payer: Self-pay | Admitting: Family Medicine

## 2022-08-10 ENCOUNTER — Encounter: Payer: Self-pay | Admitting: Gastroenterology

## 2022-08-16 ENCOUNTER — Encounter: Payer: Medicare HMO | Admitting: Gastroenterology

## 2022-08-17 ENCOUNTER — Ambulatory Visit (AMBULATORY_SURGERY_CENTER): Payer: Medicare HMO | Admitting: Gastroenterology

## 2022-08-17 ENCOUNTER — Encounter: Payer: Self-pay | Admitting: Gastroenterology

## 2022-08-17 VITALS — BP 126/77 | HR 47 | Temp 96.4°F | Resp 15 | Ht 71.0 in | Wt 207.0 lb

## 2022-08-17 DIAGNOSIS — Z85038 Personal history of other malignant neoplasm of large intestine: Secondary | ICD-10-CM

## 2022-08-17 DIAGNOSIS — D123 Benign neoplasm of transverse colon: Secondary | ICD-10-CM

## 2022-08-17 DIAGNOSIS — Z08 Encounter for follow-up examination after completed treatment for malignant neoplasm: Secondary | ICD-10-CM

## 2022-08-17 MED ORDER — SODIUM CHLORIDE 0.9 % IV SOLN: 500.0000 mL | Freq: Once | INTRAVENOUS | Status: DC

## 2022-08-17 NOTE — Patient Instructions (Signed)
Await pathology results.  YOU HAD AN ENDOSCOPIC PROCEDURE TODAY AT THE [facility]:   Refer to the procedure report that was given to you for any specific questions about what was found during the examination.  If the procedure report does not answer your questions, please call your gastroenterologist to clarify.  If you requested that your care partner not be given the details of your procedure findings, then the procedure report has been included in a sealed envelope for you to review at your convenience later.  YOU SHOULD EXPECT: Some feelings of bloating in the abdomen. Passage of more gas than usual.  Walking can help get rid of the air that was put into your GI tract during the procedure and reduce the bloating. If you had a lower endoscopy (such as a colonoscopy or flexible sigmoidoscopy) you may notice spotting of blood in your stool or on the toilet paper. If you underwent a bowel prep for your procedure, you may not have a normal bowel movement for a few days.  Please Note:  You might notice some irritation and congestion in your nose or some drainage.  This is from the oxygen used during your procedure.  There is no need for concern and it should clear up in a day or so.  SYMPTOMS TO REPORT IMMEDIATELY:  Following lower endoscopy (colonoscopy or flexible sigmoidoscopy):  Excessive amounts of blood in the stool  Significant tenderness or worsening of abdominal pains  Swelling of the abdomen that is new, acute  Fever of 100F or higher  For urgent or emergent issues, a gastroenterologist can be reached at any hour by calling [phone]. Do not use MyChart messaging for urgent concerns.    DIET:  We do recommend a small meal at first, but then you may proceed to your regular diet.  Drink plenty of fluids but you should avoid alcoholic beverages for 24 hours.  ACTIVITY:  You should plan to take it easy for the rest of today and you should NOT DRIVE or use heavy machinery  until tomorrow (because of the sedation medicines used during the test).    FOLLOW UP: Our staff will call the number listed on your records the next business day following your procedure.  We will call around 7:15- 8:00 am to check on you and address any questions or concerns that you may have regarding the information given to you following your procedure. If we do not reach you, we will leave a message.     If any biopsies were taken you will be contacted by phone or by letter within the next 1-3 weeks.  Please call us at [phone] if you have not heard about the biopsies in 3 weeks.    SIGNATURES/CONFIDENTIALITY: You and/or your care partner have signed paperwork which will be entered into your electronic medical record.  These signatures attest to the fact that that the information above on your After Visit Summary has been reviewed and is understood.  Full responsibility of the confidentiality of this discharge information lies with you and/or your care-partner.  

## 2022-08-17 NOTE — Progress Notes (Signed)
[facility] History and Physical   Primary Care Physician:  [name], MD   Reason for Procedure:   Colon cancer surveillance  Plan:    Surveillance colonoscopy     HPI: Daniel Gates is a 76 y.o. male undergoing surveillance colonoscopy.  He was diagnosed with transverse colon cancer in 2003 and underwent surgical resection.  He has had a few subcentimeter tubular adenomas on subsequent surveillance colonoscopies, most recently in 2018.  He has no family history of colon cancer and no chronic GI symptoms.    Past Medical History:  Diagnosis Date   Allergic rhinitis    Anxiety 2003   "after cancer surgery"   Asthma    childhood per pt.   Bradycardia 10/01/2016   Cancer ([place])    colon; diagnosed 2003   Cataract 12/29/2016   not sure which eye   Chronic renal insufficiency 06/07/2017   Depression    Diabetes type 2, controlled ([place]) 06/27/2012   Glaucoma    Gout    Hyperlipidemia    Hypertension    Medicare annual wellness visit, subsequent 02/15/2014   Open-angle glaucoma 12/29/2016   Sickle cell anemia ([place]) 06/12/2017   "they say I have a trace"   TMJ arthralgia 01/02/2016    Past Surgical History:  Procedure Laterality Date   COLON SURGERY  2003   for colon cancer   COLONOSCOPY     [place]   right    Prior to Admission medications   Medication Sig Start Date End Date Taking? Authorizing Provider  allopurinol (ZYLOPRIM) 100 MG tablet TAKE 1 TABLET EVERY DAY 12/05/21  Yes [name], MD  aspirin 325 MG tablet Take 325 mg by mouth daily.   Yes [provider]  atorvastatin (LIPITOR) 40 MG tablet TAKE 1 TABLET AT BEDTIME 07/03/22  Yes [name], MD  Blood Glucose Monitoring Suppl (TRUE METRIX AIR GLUCOSE METER) w/Device KIT Use to check blood sugar twice a day.  Dx code: E11.9 12/28/20  Yes [name], MD  COD LIVER OIL PO Take 1 tablet by mouth daily.   Yes [provider]  dicyclomine (BENTYL)  20 MG tablet TAKE 1 TABLET TWICE DAILY 12/05/21  Yes [name], MD  escitalopram (LEXAPRO) 10 MG tablet TAKE 1 TABLET EVERY DAY 08/03/22  Yes [name], MD  fenofibrate (TRICOR) 145 MG tablet TAKE 1 TABLET EVERY DAY 07/03/22  Yes [name], MD  finasteride (PROSCAR) 5 MG tablet Take 5 mg by mouth daily. 06/28/22  Yes [provider]  Garlic Oil 7425 MG CAPS Take 1 capsule by mouth daily.   Yes [provider]  glucose blood (TRUE METRIX BLOOD GLUCOSE TEST) test strip Use to check blood sugar twice a day.  Dx code: E11.9 12/28/20  Yes [name], MD  hydrochlorothiazide (MICROZIDE) 12.5 MG capsule TAKE 1 CAPSULE EVERY DAY 02/23/22  Yes [name], MD  HYDROcodone-acetaminophen (NORCO) 10-325 MG tablet Take 1 tablet by mouth 2 (two) times daily as needed. 03/01/21  Yes [name], MD  latanoprost (XALATAN) 0.005 % ophthalmic solution Place 1 drop into both eyes nightly. 08/01/17  Yes [provider]  metFORMIN (GLUCOPHAGE) 500 MG tablet TAKE 1 TABLET TWICE DAILY WITH A MEAL 04/17/22  Yes [name], MD  metoprolol succinate (TOPROL-XL) 25 MG 24 hr tablet TAKE 1 TABLET EVERY DAY 07/03/22  Yes [name], MD  Multiple Vitamins-Minerals (ONE-A-DAY MENS  HEALTH FORMULA) TABS Take 1 tablet by mouth daily.   Yes [provider]  rOPINIRole (REQUIP) 1 MG tablet TAKE 1 TO 2 TABLETS AT BEDTIME 08/03/22  Yes [name], MD  tamsulosin (FLOMAX) 0.4 MG CAPS capsule TAKE 1 CAPSULE EVERY DAY AFTER SUPPER 02/28/22  Yes [name], MD  TRUEplus Lancets 33G MISC Use to check blood sugar twice a day.  Dx code: E11.9 12/28/20  Yes [name], MD  COVID-19 mRNA bivalent vaccine, Pfizer, (PFIZER COVID-19 VAC BIVALENT) injection Inject into the muscle. 10/24/21   Carlyle Basques, MD  promethazine (PHENERGAN) 25 MG tablet TAKE ONE (1) TABLET(S) EVERY FOUR (4) HOURS AS NEEDED FOR NAUSEA 02/20/12   Burnice Logan, MD  sildenafil (VIAGRA) 100 MG  tablet Take 100 mg by mouth as needed. 07/14/22   [provider]    Current Outpatient Medications  Medication Sig Dispense Refill   allopurinol (ZYLOPRIM) 100 MG tablet TAKE 1 TABLET EVERY DAY 90 tablet 1   aspirin 325 MG tablet Take 325 mg by mouth daily.     atorvastatin (LIPITOR) 40 MG tablet TAKE 1 TABLET AT BEDTIME 90 tablet 1   Blood Glucose Monitoring Suppl (TRUE METRIX AIR GLUCOSE METER) w/Device KIT Use to check blood sugar twice a day.  Dx code: E11.9 1 kit 0   COD LIVER OIL PO Take 1 tablet by mouth daily.     dicyclomine (BENTYL) 20 MG tablet TAKE 1 TABLET TWICE DAILY 180 tablet 1   escitalopram (LEXAPRO) 10 MG tablet TAKE 1 TABLET EVERY DAY 90 tablet 1   fenofibrate (TRICOR) 145 MG tablet TAKE 1 TABLET EVERY DAY 90 tablet 1   finasteride (PROSCAR) 5 MG tablet Take 5 mg by mouth daily.     Garlic Oil 4944 MG CAPS Take 1 capsule by mouth daily.     glucose blood (TRUE METRIX BLOOD GLUCOSE TEST) test strip Use to check blood sugar twice a day.  Dx code: E11.9 200 each 1   hydrochlorothiazide (MICROZIDE) 12.5 MG capsule TAKE 1 CAPSULE EVERY DAY 90 capsule 1   HYDROcodone-acetaminophen (NORCO) 10-325 MG tablet Take 1 tablet by mouth 2 (two) times daily as needed. 30 tablet 0   latanoprost (XALATAN) 0.005 % ophthalmic solution Place 1 drop into both eyes nightly.     metFORMIN (GLUCOPHAGE) 500 MG tablet TAKE 1 TABLET TWICE DAILY WITH A MEAL 180 tablet 1   metoprolol succinate (TOPROL-XL) 25 MG 24 hr tablet TAKE 1 TABLET EVERY DAY 90 tablet 1   Multiple Vitamins-Minerals (ONE-A-DAY MENS HEALTH FORMULA) TABS Take 1 tablet by mouth daily.     rOPINIRole (REQUIP) 1 MG tablet TAKE 1 TO 2 TABLETS AT BEDTIME 180 tablet 1   tamsulosin (FLOMAX) 0.4 MG CAPS capsule TAKE 1 CAPSULE EVERY DAY AFTER SUPPER 90 capsule 1   TRUEplus Lancets 33G MISC Use to check blood sugar twice a day.  Dx code: E11.9 200 each 1   COVID-19 mRNA bivalent vaccine, Pfizer, (PFIZER COVID-19 VAC BIVALENT)  injection Inject into the muscle. 0.3 mL 0   promethazine (PHENERGAN) 25 MG tablet TAKE ONE (1) TABLET(S) EVERY FOUR (4) HOURS AS NEEDED FOR NAUSEA 60 tablet 0   sildenafil (VIAGRA) 100 MG tablet Take 100 mg by mouth as needed.     Current Facility-Administered Medications  Medication Dose Route Frequency Provider Last Rate Last Admin   0.9 %  sodium chloride infusion  500 mL Intravenous Once Daryel November, MD  Allergies as of 08/17/2022   (No Known Allergies)    Family History  Problem Relation Age of Onset   Diabetes Mother    Heart disease Mother        MI   Cancer Father        prostate   Prostate cancer Father    Dementia Sister    Thyroid disease Sister    Diabetes Sister    Heart disease Brother        CAD s/p CABG   Prostate cancer Brother    Colon cancer Neg Hx    Stomach cancer Neg Hx    Esophageal cancer Neg Hx    Rectal cancer Neg Hx     Social History   Socioeconomic History   Marital status: Widowed    Spouse name: Not on file   Number of children: 7   Years of education: Not on file   Highest education level: Not on file  Occupational History    Employer: DISABLED  Tobacco Use   Smoking status: Never   Smokeless tobacco: Never  Vaping Use   Vaping Use: Never used  Substance and Sexual Activity   Alcohol use: No   Drug use: No   Sexual activity: Yes  Other Topics Concern   Not on file  Social History Narrative   Not on file   Social Determinants of Health   Financial Resource Strain: Low Risk  (07/07/2021)   Overall Financial Resource Strain (CARDIA)    Difficulty of Paying Living Expenses: Not hard at all  Food Insecurity: No Food Insecurity (07/07/2021)   Hunger Vital Sign    Worried About Running Out of Food in the Last Year: Never true    Ray in the Last Year: Never true  Transportation Needs: No Transportation Needs (07/07/2021)   PRAPARE - Hydrologist (Medical): No    Lack of  Transportation (Non-Medical): No  Physical Activity: Inactive (07/07/2021)   Exercise Vital Sign    Days of Exercise per Week: 0 days    Minutes of Exercise per Session: 0 min  Stress: No Stress Concern Present (07/07/2021)   [place]    Feeling of Stress : Not at all  Social Connections: Moderately Isolated (07/07/2021)   Social Connection and Isolation Panel [NHANES]    Frequency of Communication with Friends and Family: More than three times a week    Frequency of Social Gatherings with Friends and Family: More than three times a week    Attends Religious Services: More than 4 times per year    Active Member of [employer] or Organizations: No    Attends Archivist Meetings: Never    Marital Status: Widowed  Intimate Partner Violence: Not At Risk (07/07/2021)   Humiliation, Afraid, Rape, and Kick questionnaire    Fear of Current or Ex-Partner: No    Emotionally Abused: No    Physically Abused: No    Sexually Abused: No    Review of Systems:  All other review of systems negative except as mentioned in the HPI.  Physical Exam: Vital signs BP 122/68   Pulse (!) 46   Temp (!) 96.4 F (35.8 C)   Ht 5' 11"  (1.803 m)   Wt 207 lb (93.9 kg)   SpO2 97%   BMI 28.87 kg/m   General:   Alert,  Well-developed, well-nourished, pleasant and cooperative in NAD Airway:  Mallampati  2 Lungs:  Clear throughout to auscultation.   Heart:  Regular rate and rhythm; no murmurs, clicks, rubs,  or gallops. Abdomen:  Soft, nontender and nondistended. Normal bowel sounds.   Neuro/Psych:  Normal mood and affect. A and O x 3   Myeasha Ballowe E. Candis Schatz, MD [facility]

## 2022-08-17 NOTE — Progress Notes (Signed)
To pacu, vss. Report to [email or web address] 

## 2022-08-17 NOTE — Progress Notes (Signed)
Called to room to assist during endoscopic procedure.  Patient ID and intended procedure confirmed with present staff. Received instructions for my participation in the procedure from the performing physician.  

## 2022-08-17 NOTE — Op Note (Signed)
[place] Patient Name: Daniel Gates Procedure Date: 08/17/2022 9:08 AM MRN: [id number] Endoscopist: Nicki Reaper E. Candis Schatz , MD Age: 76 Referring MD:  Date of Birth: December 01, 1946 Gender: Male Account #: [id number] Procedure:                Colonoscopy Indications:              High risk colon cancer surveillance: Personal                            history of colon cancer Medicines:                Monitored Anesthesia Care Procedure:                Pre-Anesthesia Assessment:                           - Prior to the procedure, a History and Physical                            was performed, and patient medications and                            allergies were reviewed. The patient's tolerance of                            previous anesthesia was also reviewed. The risks                            and benefits of the procedure and the sedation                            options and risks were discussed with the patient.                            All questions were answered, and informed consent                            was obtained. Prior Anticoagulants: The patient has                            taken no previous anticoagulant or antiplatelet                            agents. ASA Grade Assessment: II - A patient with                            mild systemic disease. After reviewing the risks                            and benefits, the patient was deemed in                            satisfactory condition to undergo the procedure.  After obtaining informed consent, the colonoscope                            was passed under direct vision. Throughout the                            procedure, the patient's blood pressure, pulse, and                            oxygen saturations were monitored continuously. The                            Olympus CF-HQ190L [id number]) Colonoscope was                            introduced through the anus and advanced  to the the                            terminal ileum, with identification of the                            appendiceal orifice and IC valve. The colonoscopy                            was performed without difficulty. The patient                            tolerated the procedure well. The quality of the                            bowel preparation was adequate. The terminal ileum,                            ileocecal valve, appendiceal orifice, and rectum                            were photographed. The bowel preparation used was                            Miralax via split dose instruction. Scope In: 9:12:09 AM Scope Out: 9:28:16 AM Scope Withdrawal Time: 0 hours 11 minutes 28 seconds  Total Procedure Duration: 0 hours 16 minutes 7 seconds  Findings:                 The perianal and digital rectal examinations were                            normal. Pertinent negatives include normal                            sphincter tone and no palpable rectal lesions.                           A 4 mm polyp was found in the transverse colon. The  polyp was sessile. The polyp was removed with a                            cold snare. Resection and retrieval were complete.                            Estimated blood loss was minimal.                           A 3 mm polyp was found in the splenic flexure. The                            polyp was flat. The polyp was removed with a cold                            snare. Resection and retrieval were complete.                            Estimated blood loss was minimal.                           Multiple small and large-mouthed diverticula were                            found in the sigmoid colon and descending colon.                            There was evidence of an impacted diverticulum.                           There was evidence of a prior end-to-side                            colo-colonic anastomosis in the transverse  colon.                            This was patent and was characterized by healthy                            appearing mucosa.                           The exam was otherwise normal throughout the                            examined colon.                           The terminal ileum appeared normal.                           The retroflexed view of the distal rectum and anal  verge was normal and showed no anal or rectal                            abnormalities. Complications:            No immediate complications. Estimated Blood Loss:     Estimated blood loss was minimal. Impression:               - One 4 mm polyp in the transverse colon, removed                            with a cold snare. Resected and retrieved.                           - One 3 mm polyp at the splenic flexure, removed                            with a cold snare. Resected and retrieved.                           - Moderate diverticulosis in the sigmoid colon and                            in the descending colon. There was evidence of an                            impacted diverticulum.                           - Patent end-to-side colo-colonic anastomosis,                            characterized by healthy appearing mucosa.                           - The examined portion of the ileum was normal.                           - The distal rectum and anal verge are normal on                            retroflexion view. Recommendation:           - Patient has a contact number available for                            emergencies. The signs and symptoms of potential                            delayed complications were discussed with the                            patient. Return to normal activities tomorrow.  Written discharge instructions were provided to the                            patient.                           - Resume previous diet.                            - Continue present medications.                           - Await pathology results.                           - Given the patient's age and lack of high risk                            polyps since his colon resection 20 years ago, I                            would recommend against any further colon cancer                            surveillance. Audie Wieser E. Candis Schatz, MD 08/17/2022 9:37:31 AM This report has been signed electronically.

## 2022-08-17 NOTE — Progress Notes (Signed)
VS by CW  Pt's states no medical or surgical changes since previsit or office visit.  

## 2022-08-18 ENCOUNTER — Telehealth: Payer: Self-pay

## 2022-08-18 NOTE — Telephone Encounter (Signed)
Left message on follow up call. 

## 2022-08-22 ENCOUNTER — Encounter: Payer: Self-pay | Admitting: Gastroenterology

## 2022-08-23 NOTE — Assessment & Plan Note (Signed)
Hydrate and monitor 

## 2022-08-23 NOTE — Assessment & Plan Note (Signed)
Well controlled, no changes to meds. Encouraged heart healthy diet such as the DASH diet and exercise as tolerated.  °

## 2022-08-23 NOTE — Assessment & Plan Note (Signed)
hgba1c acceptable, minimize simple carbs. Increase exercise as tolerated. Continue current meds 

## 2022-08-23 NOTE — Assessment & Plan Note (Signed)
Supplement and monitor 

## 2022-08-23 NOTE — Assessment & Plan Note (Signed)
Encourage heart healthy diet such as MIND or DASH diet, increase exercise, avoid trans fats, simple carbohydrates and processed foods, consider a krill or fish or flaxseed oil cap daily. Tolerating Atorvastatin 

## 2022-08-24 ENCOUNTER — Ambulatory Visit (INDEPENDENT_AMBULATORY_CARE_PROVIDER_SITE_OTHER): Payer: Medicare HMO | Admitting: Family Medicine

## 2022-08-24 ENCOUNTER — Other Ambulatory Visit (INDEPENDENT_AMBULATORY_CARE_PROVIDER_SITE_OTHER): Payer: Medicare HMO

## 2022-08-24 DIAGNOSIS — M1A0710 Idiopathic chronic gout, right ankle and foot, without tophus (tophi): Secondary | ICD-10-CM

## 2022-08-24 DIAGNOSIS — E038 Other specified hypothyroidism: Secondary | ICD-10-CM | POA: Diagnosis not present

## 2022-08-24 DIAGNOSIS — N289 Disorder of kidney and ureter, unspecified: Secondary | ICD-10-CM

## 2022-08-24 DIAGNOSIS — E119 Type 2 diabetes mellitus without complications: Secondary | ICD-10-CM

## 2022-08-24 DIAGNOSIS — I10 Essential (primary) hypertension: Secondary | ICD-10-CM | POA: Diagnosis not present

## 2022-08-24 DIAGNOSIS — E559 Vitamin D deficiency, unspecified: Secondary | ICD-10-CM | POA: Diagnosis not present

## 2022-08-24 DIAGNOSIS — N189 Chronic kidney disease, unspecified: Secondary | ICD-10-CM | POA: Diagnosis not present

## 2022-08-24 DIAGNOSIS — E7849 Other hyperlipidemia: Secondary | ICD-10-CM | POA: Diagnosis not present

## 2022-08-24 LAB — CBC
HCT: 40.1 % (ref 39.0–52.0)
Hemoglobin: 13.1 g/dL (ref 13.0–17.0)
MCHC: 32.8 g/dL (ref 30.0–36.0)
MCV: 79.1 fl (ref 78.0–100.0)
Platelets: 185 10*3/uL (ref 150.0–400.0)
RBC: 5.07 Mil/uL (ref 4.22–5.81)
RDW: 16.7 % — ABNORMAL HIGH (ref 11.5–15.5)
WBC: 5.5 10*3/uL (ref 4.0–10.5)

## 2022-08-24 LAB — COMPREHENSIVE METABOLIC PANEL
ALT: 22 U/L (ref 0–53)
AST: 27 U/L (ref 0–37)
Albumin: 3.7 g/dL (ref 3.5–5.2)
Alkaline Phosphatase: 48 U/L (ref 39–117)
BUN: 19 mg/dL (ref 6–23)
CO2: 24 mEq/L (ref 19–32)
Calcium: 9.8 mg/dL (ref 8.4–10.5)
Chloride: 102 mEq/L (ref 96–112)
Creatinine, Ser: 1.57 mg/dL — ABNORMAL HIGH (ref 0.40–1.50)
GFR: 42.22 mL/min — ABNORMAL LOW (ref >60.00)
Glucose, Bld: 78 mg/dL (ref 70–99)
Potassium: 4.2 mEq/L (ref 3.5–5.1)
Sodium: 136 mEq/L (ref 135–145)
Total Bilirubin: 0.4 mg/dL (ref 0.2–1.2)
Total Protein: 7.5 g/dL (ref 6.0–8.3)

## 2022-08-24 LAB — LIPID PANEL
Cholesterol: 101 mg/dL (ref 0–200)
HDL: 35 mg/dL — ABNORMAL LOW (ref >39.00)
LDL Cholesterol: 38 mg/dL (ref 0–99)
NonHDL: 66.23
Total CHOL/HDL Ratio: 3
Triglycerides: 142 mg/dL (ref 0.0–149.0)
VLDL: 28.4 mg/dL (ref 0.0–40.0)

## 2022-08-24 NOTE — Patient Instructions (Addendum)
RSV (respiratory syncitial virus) vaccine at pharmacy, Arexvy  Covid booster when new version out late September At pharmacy High dose flu shot mid Sept to mid Oct   Prevnar 20 daily  Shingrix is the new shingles shot, 2 shots over 2-6 months, confirm coverage with insurance and document, then can return here for shots with nurse appt or at pharmacy

## 2022-08-24 NOTE — Progress Notes (Signed)
Subjective:   By signing my name below, I, [name], attest that this documentation has been prepared under the direction and in the presence of [name], MD 08/24/2022.    Patient ID: Daniel Gates, male    DOB: June 13, 1946, 76 y.o.   MRN: [id number]  Chief Complaint  Patient presents with   Follow-up    Here for follow up   HPI Patient is in today for an office visit.  He complains of congestion and diarrhea today that started yesterday on 08/23/2022. He states that he has had 3-4 loose stools within the past 24 hours. He denies fever, headache, and sore throat. He states that he did have chills a few days ago but not today.   Blood pressure: His blood pressure is within normal range today.  BP Readings from Last 3 Encounters:  08/24/22 118/72  08/17/22 126/77  07/11/22 (!) 144/85   Diet: He reports that he attempts to eat well.  Immunizations: He has been informed about receiving COVID-19, high-dose Flu, RSV, and Shingles immunizations. He declines to receive the high-dose Flu immunization today.   Sleep: He reports that he is sleeping well.  Past Medical History:  Diagnosis Date   Allergic rhinitis    Anxiety 2003   "after cancer surgery"   Asthma    childhood per pt.   Bradycardia 10/01/2016   Cancer ([place])    colon; diagnosed 2003   Cataract 12/29/2016   not sure which eye   Chronic renal insufficiency 06/07/2017   Depression    Diabetes type 2, controlled ([place]) 06/27/2012   Glaucoma    Gout    Hyperlipidemia    Hypertension    Medicare annual wellness visit, subsequent 02/15/2014   Open-angle glaucoma 12/29/2016   Sickle cell anemia ([place]) 06/12/2017   "they say I have a trace"   TMJ arthralgia 01/02/2016   Past Surgical History:  Procedure Laterality Date   COLON SURGERY  2003   for colon cancer   COLONOSCOPY     INGUINAL HERNIA REPAIR  1997   right   Family History  Problem Relation Age of Onset   Diabetes Mother    Heart disease Mother         MI   Cancer Father        prostate   Prostate cancer Father    Dementia Sister    Thyroid disease Sister    Diabetes Sister    Heart disease Brother        CAD s/p CABG   Prostate cancer Brother    Colon cancer Neg Hx    Stomach cancer Neg Hx    Esophageal cancer Neg Hx    Rectal cancer Neg Hx    Social History   Socioeconomic History   Marital status: Widowed    Spouse name: Not on file   Number of children: 7   Years of education: Not on file   Highest education level: Not on file  Occupational History    Employer: DISABLED  Tobacco Use   Smoking status: Never   Smokeless tobacco: Never  Vaping Use   Vaping Use: Never used  Substance and Sexual Activity   Alcohol use: No   Drug use: No   Sexual activity: Yes  Other Topics Concern   Not on file  Social History Narrative   Not on file   Social Determinants of Health   Financial Resource Strain: Low Risk  (07/07/2021)   Overall Financial  Resource Strain (CARDIA)    Difficulty of Paying Living Expenses: Not hard at all  Food Insecurity: No Food Insecurity (07/07/2021)   Hunger Vital Sign    Worried About Running Out of Food in the Last Year: Never true    Ran Out of Food in the Last Year: Never true  Transportation Needs: No Transportation Needs (07/07/2021)   PRAPARE - Hydrologist (Medical): No    Lack of Transportation (Non-Medical): No  Physical Activity: Inactive (07/07/2021)   Exercise Vital Sign    Days of Exercise per Week: 0 days    Minutes of Exercise per Session: 0 min  Stress: No Stress Concern Present (07/07/2021)   [place]    Feeling of Stress : Not at all  Social Connections: Moderately Isolated (07/07/2021)   Social Connection and Isolation Panel [NHANES]    Frequency of Communication with Friends and Family: More than three times a week    Frequency of Social Gatherings with Friends and Family: More  than three times a week    Attends Religious Services: More than 4 times per year    Active Member of [employer] or Organizations: No    Attends Archivist Meetings: Never    Marital Status: Widowed  Intimate Partner Violence: Not At Risk (07/07/2021)   Humiliation, Afraid, Rape, and Kick questionnaire    Fear of Current or Ex-Partner: No    Emotionally Abused: No    Physically Abused: No    Sexually Abused: No   Outpatient Medications Prior to Visit  Medication Sig Dispense Refill   allopurinol (ZYLOPRIM) 100 MG tablet TAKE 1 TABLET EVERY DAY 90 tablet 1   aspirin 325 MG tablet Take 325 mg by mouth daily.     atorvastatin (LIPITOR) 40 MG tablet TAKE 1 TABLET AT BEDTIME 90 tablet 1   Blood Glucose Monitoring Suppl (TRUE METRIX AIR GLUCOSE METER) w/Device KIT Use to check blood sugar twice a day.  Dx code: E11.9 1 kit 0   COD LIVER OIL PO Take 1 tablet by mouth daily.     COVID-19 mRNA bivalent vaccine, Pfizer, (PFIZER COVID-19 VAC BIVALENT) injection Inject into the muscle. 0.3 mL 0   dicyclomine (BENTYL) 20 MG tablet TAKE 1 TABLET TWICE DAILY 180 tablet 1   escitalopram (LEXAPRO) 10 MG tablet TAKE 1 TABLET EVERY DAY 90 tablet 1   fenofibrate (TRICOR) 145 MG tablet TAKE 1 TABLET EVERY DAY 90 tablet 1   finasteride (PROSCAR) 5 MG tablet Take 5 mg by mouth daily.     Garlic Oil 5465 MG CAPS Take 1 capsule by mouth daily.     glucose blood (TRUE METRIX BLOOD GLUCOSE TEST) test strip Use to check blood sugar twice a day.  Dx code: E11.9 200 each 1   hydrochlorothiazide (MICROZIDE) 12.5 MG capsule TAKE 1 CAPSULE EVERY DAY 90 capsule 1   HYDROcodone-acetaminophen (NORCO) 10-325 MG tablet Take 1 tablet by mouth 2 (two) times daily as needed. 30 tablet 0   latanoprost (XALATAN) 0.005 % ophthalmic solution Place 1 drop into both eyes nightly.     metFORMIN (GLUCOPHAGE) 500 MG tablet TAKE 1 TABLET TWICE DAILY WITH A MEAL 180 tablet 1   metoprolol succinate (TOPROL-XL) 25 MG 24 hr tablet TAKE 1  TABLET EVERY DAY 90 tablet 1   Multiple Vitamins-Minerals (ONE-A-DAY MENS HEALTH FORMULA) TABS Take 1 tablet by mouth daily.     promethazine (PHENERGAN) 25 MG  tablet TAKE ONE (1) TABLET(S) EVERY FOUR (4) HOURS AS NEEDED FOR NAUSEA 60 tablet 0   rOPINIRole (REQUIP) 1 MG tablet TAKE 1 TO 2 TABLETS AT BEDTIME 180 tablet 1   sildenafil (VIAGRA) 100 MG tablet Take 100 mg by mouth as needed.     tamsulosin (FLOMAX) 0.4 MG CAPS capsule TAKE 1 CAPSULE EVERY DAY AFTER SUPPER 90 capsule 1   TRUEplus Lancets 33G MISC Use to check blood sugar twice a day.  Dx code: E11.9 200 each 1   No facility-administered medications prior to visit.   No Known Allergies  Review of Systems  Constitutional:  Negative for chills and fever.  HENT:  Positive for congestion. Negative for sore throat.   Gastrointestinal:  Positive for diarrhea.  Neurological:  Negative for headaches.      Objective:    Physical Exam Constitutional:      General: He is not in acute distress.    Appearance: Normal appearance. He is not ill-appearing.  HENT:     Head: Normocephalic and atraumatic.     Right Ear: External ear normal.     Left Ear: External ear normal.     Mouth/Throat:     Mouth: Mucous membranes are moist.     Pharynx: Oropharynx is clear.  Eyes:     Extraocular Movements: Extraocular movements intact.     Pupils: Pupils are equal, round, and reactive to light.  Cardiovascular:     Rate and Rhythm: Normal rate and regular rhythm.     Pulses: Normal pulses.     Heart sounds: Normal heart sounds. No murmur heard.    No gallop.  Pulmonary:     Effort: Pulmonary effort is normal. No respiratory distress.     Breath sounds: Normal breath sounds. No wheezing or rales.  Abdominal:     General: Bowel sounds are normal.  Skin:    General: Skin is warm and dry.  Neurological:     Mental Status: He is alert and oriented to person, place, and time.  Psychiatric:        Mood and Affect: Mood normal.         Behavior: Behavior normal.        Judgment: Judgment normal.    BP 118/72 (BP Location: Right Arm, Patient Position: Sitting, Cuff Size: Normal)   Pulse 73   Temp 97.9 F (36.6 C) (Oral)   Resp 16   Ht 6' (1.829 m)   Wt 203 lb (92.1 kg)   SpO2 95%   BMI 27.53 kg/m  Wt Readings from Last 3 Encounters:  08/24/22 203 lb (92.1 kg)  08/17/22 207 lb (93.9 kg)  07/11/22 206 lb 9.6 oz (93.7 kg)   Diabetic Foot Exam - Simple   No data filed    Lab Results  Component Value Date   WBC 5.8 05/04/2022   HGB 13.4 05/04/2022   HCT 41.2 05/04/2022   PLT 220.0 05/04/2022   GLUCOSE 131 (H) 05/04/2022   CHOL 113 05/04/2022   TRIG 174.0 (H) 05/04/2022   HDL 34.30 (L) 05/04/2022   LDLDIRECT 93.0 04/18/2021   LDLCALC 44 05/04/2022   ALT 31 05/04/2022   AST 33 05/04/2022   NA 137 05/04/2022   K 4.1 05/04/2022   CL 101 05/04/2022   CREATININE 1.72 (H) 05/04/2022   BUN 19 05/04/2022   CO2 27 05/04/2022   TSH 4.92 05/04/2022   PSA 7.58 (H) 10/24/2021   HGBA1C 6.4 05/04/2022   MICROALBUR <0.7 05/04/2022  Lab Results  Component Value Date   TSH 4.92 05/04/2022   Lab Results  Component Value Date   WBC 5.8 05/04/2022   HGB 13.4 05/04/2022   HCT 41.2 05/04/2022   MCV 79.0 05/04/2022   PLT 220.0 05/04/2022   Lab Results  Component Value Date   NA 137 05/04/2022   K 4.1 05/04/2022   CO2 27 05/04/2022   GLUCOSE 131 (H) 05/04/2022   BUN 19 05/04/2022   CREATININE 1.72 (H) 05/04/2022   BILITOT 0.5 05/04/2022   ALKPHOS 43 05/04/2022   AST 33 05/04/2022   ALT 31 05/04/2022   PROT 7.5 05/04/2022   ALBUMIN 4.0 05/04/2022   CALCIUM 9.8 05/04/2022   GFR 37.92 (L) 05/04/2022   Lab Results  Component Value Date   CHOL 113 05/04/2022   Lab Results  Component Value Date   HDL 34.30 (L) 05/04/2022   Lab Results  Component Value Date   LDLCALC 44 05/04/2022   Lab Results  Component Value Date   TRIG 174.0 (H) 05/04/2022   Lab Results  Component Value Date   CHOLHDL 3  05/04/2022   Lab Results  Component Value Date   HGBA1C 6.4 05/04/2022   Colonoscopy: Last completed on 08/17/2022.  - One 4 mm polyp in the transverse colon, removed with a cold snare. Resected and retrieved. - One 3 mm polyp at the splenic flexure, removed with a cold snare. Resected and retrieved. - Moderate diverticulosis in the sigmoid colon and in the descending colon. There was evidence of an impacted diverticulum. - Patent end-to-side colo-colonic anastomosis, characterized by healthy appearing mucosa. - The examined portion of the ileum was normal. - The distal rectum and anal verge are normal on retroflexion view.    Assessment & Plan:   Problem List Items Addressed This Visit     Hypothyroidism   Vitamin D deficiency    Supplement and monitor      Relevant Orders   VITAMIN D 25 Hydroxy (Vit-D Deficiency, Fractures)   Hyperlipidemia    Encourage heart healthy diet such as MIND or DASH diet, increase exercise, avoid trans fats, simple carbohydrates and processed foods, consider a krill or fish or flaxseed oil cap daily. Tolerating Atorvastatin      Relevant Orders   Lipid panel   Essential hypertension    Well controlled, no changes to meds. Encouraged heart healthy diet such as the DASH diet and exercise as tolerated.       Relevant Orders   CBC   Comprehensive metabolic panel   TSH   Gout    Hydrate and monitor      Non-insulin treated type 2 diabetes mellitus (HCC)    hgba1c acceptable, minimize simple carbs. Increase exercise as tolerated. Continue current meds      Relevant Orders   Hemoglobin A1c   Chronic renal insufficiency    Hydrate and monitor      No orders of the defined types were placed in this encounter.  I, Penni Homans, MD, personally preformed the services described in this documentation.  All medical record entries made by the scribe were at my direction and in my presence.  I have reviewed the chart and discharge instructions (if  applicable) and agree that the record reflects my personal performance and is accurate and complete. 08/24/2022  I,Mohammed Iqbal,acting as a scribe for Penni Homans, MD.,have documented all relevant documentation on the behalf of Penni Homans, MD,as directed by  Penni Homans, MD while in the presence  of Penni Homans, MD.  Penni Homans, MD

## 2022-08-25 LAB — VITAMIN D 25 HYDROXY (VIT D DEFICIENCY, FRACTURES): VITD: 93.23 ng/mL (ref 30.00–100.00)

## 2022-08-25 LAB — TSH: TSH: 2.62 u[IU]/mL (ref 0.35–5.50)

## 2022-08-28 LAB — HEMOGLOBIN A1C: Hgb A1c MFr Bld: 6.4 % (ref 4.6–6.5)

## 2022-09-14 ENCOUNTER — Other Ambulatory Visit: Payer: Self-pay | Admitting: Family Medicine

## 2022-11-07 ENCOUNTER — Ambulatory Visit (INDEPENDENT_AMBULATORY_CARE_PROVIDER_SITE_OTHER): Payer: Medicare HMO | Admitting: Family Medicine

## 2022-11-07 VITALS — BP 128/72 | HR 56 | Temp 97.8°F | Resp 16 | Ht 72.0 in | Wt 205.0 lb

## 2022-11-07 DIAGNOSIS — D649 Anemia, unspecified: Secondary | ICD-10-CM | POA: Diagnosis not present

## 2022-11-07 DIAGNOSIS — E559 Vitamin D deficiency, unspecified: Secondary | ICD-10-CM | POA: Diagnosis not present

## 2022-11-07 DIAGNOSIS — E7849 Other hyperlipidemia: Secondary | ICD-10-CM

## 2022-11-07 DIAGNOSIS — N289 Disorder of kidney and ureter, unspecified: Secondary | ICD-10-CM

## 2022-11-07 DIAGNOSIS — I10 Essential (primary) hypertension: Secondary | ICD-10-CM

## 2022-11-07 DIAGNOSIS — E038 Other specified hypothyroidism: Secondary | ICD-10-CM

## 2022-11-07 DIAGNOSIS — N189 Chronic kidney disease, unspecified: Secondary | ICD-10-CM | POA: Diagnosis not present

## 2022-11-07 LAB — COMPREHENSIVE METABOLIC PANEL
ALT: 26 U/L (ref 0–53)
AST: 26 U/L (ref 0–37)
Albumin: 3.9 g/dL (ref 3.5–5.2)
Alkaline Phosphatase: 48 U/L (ref 39–117)
BUN: 21 mg/dL (ref 6–23)
CO2: 30 mEq/L (ref 19–32)
Calcium: 9.8 mg/dL (ref 8.4–10.5)
Chloride: 101 mEq/L (ref 96–112)
Creatinine, Ser: 1.8 mg/dL — ABNORMAL HIGH (ref 0.40–1.50)
GFR: 35.78 mL/min — ABNORMAL LOW (ref >60.00)
Glucose, Bld: 88 mg/dL (ref 70–99)
Potassium: 4.6 mEq/L (ref 3.5–5.1)
Sodium: 139 mEq/L (ref 135–145)
Total Bilirubin: 0.3 mg/dL (ref 0.2–1.2)
Total Protein: 7.6 g/dL (ref 6.0–8.3)

## 2022-11-07 NOTE — Progress Notes (Signed)
Subjective:   By signing my name below, I, Daniel Gates, attest that this documentation has been prepared under the direction and in the presence of [name], MD., 11/07/2022.     Patient ID: Daniel Gates, male    DOB: 1946-08-13, 76 y.o.   MRN: [id number]  No chief complaint on file.  HPI Patient is in today for an office visit.  Upper Respiratory Infection Patient reports that he has been feeling ill since 11/01/2022. He reports having a scratchy throat. He has not taken a COVID-19 test. He is feeling slightly better since the onset of his symptoms and has been resting. Denies CP/ palp/ SOB/ HA/ congestion/fevers/GI or GU c/o.   Past Medical History:  Diagnosis Date   Allergic rhinitis    Anxiety 2003   "after cancer surgery"   Asthma    childhood per pt.   Bradycardia 10/01/2016   Cancer ([place])    colon; diagnosed 2003   Cataract 12/29/2016   not sure which eye   Chronic renal insufficiency 06/07/2017   Depression    Diabetes type 2, controlled ([place]) 06/27/2012   Glaucoma    Gout    Hyperlipidemia    Hypertension    Medicare annual wellness visit, subsequent 02/15/2014   Open-angle glaucoma 12/29/2016   Sickle cell anemia ([place]) 06/12/2017   "they say I have a trace"   TMJ arthralgia 01/02/2016   Past Surgical History:  Procedure Laterality Date   COLON SURGERY  2003   for colon cancer   COLONOSCOPY     INGUINAL HERNIA REPAIR  1997   right   Family History  Problem Relation Age of Onset   Diabetes Mother    Heart disease Mother        MI   Cancer Father        prostate   Prostate cancer Father    Dementia Sister    Thyroid disease Sister    Diabetes Sister    Heart disease Brother        CAD s/p CABG   Prostate cancer Brother    Colon cancer Neg Hx    Stomach cancer Neg Hx    Esophageal cancer Neg Hx    Rectal cancer Neg Hx    Social History   Socioeconomic History   Marital status: Widowed    Spouse name: Not on file   Number of  children: 7   Years of education: Not on file   Highest education level: Not on file  Occupational History    Employer: DISABLED  Tobacco Use   Smoking status: Never   Smokeless tobacco: Never  Vaping Use   Vaping Use: Never used  Substance and Sexual Activity   Alcohol use: No   Drug use: No   Sexual activity: Yes  Other Topics Concern   Not on file  Social History Narrative   Not on file   Social Determinants of Health   Financial Resource Strain: Low Risk  (07/07/2021)   Overall Financial Resource Strain (CARDIA)    Difficulty of Paying Living Expenses: Not hard at all  Food Insecurity: No Food Insecurity (07/07/2021)   Hunger Vital Sign    Worried About Running Out of Food in the Last Year: Never true    [place] in the Last Year: Never true  Transportation Needs: No Transportation Needs (07/07/2021)   PRAPARE - Hydrologist (Medical): No    Lack of Transportation (  Non-Medical): No  Physical Activity: Inactive (07/07/2021)   Exercise Vital Sign    Days of Exercise per Week: 0 days    Minutes of Exercise per Session: 0 min  Stress: No Stress Concern Present (07/07/2021)   [name]    Feeling of Stress : Not at all  Social Connections: Moderately Isolated (07/07/2021)   Social Connection and Isolation Panel [NHANES]    Frequency of Communication with Friends and Family: More than three times a week    Frequency of Social Gatherings with Friends and Family: More than three times a week    Attends Religious Services: More than 4 times per year    Active Member of [employer] or Organizations: No    Attends Archivist Meetings: Never    Marital Status: Widowed  Intimate Partner Violence: Not At Risk (07/07/2021)   Humiliation, Afraid, Rape, and Kick questionnaire    Fear of Current or Ex-Partner: No    Emotionally Abused: No    Physically Abused: No    Sexually Abused: No    Outpatient Medications Prior to Visit  Medication Sig Dispense Refill   allopurinol (ZYLOPRIM) 100 MG tablet TAKE 1 TABLET EVERY DAY 90 tablet 0   aspirin 325 MG tablet Take 325 mg by mouth daily.     atorvastatin (LIPITOR) 40 MG tablet TAKE 1 TABLET AT BEDTIME 90 tablet 1   Blood Glucose Monitoring Suppl (TRUE METRIX AIR GLUCOSE METER) w/Device KIT Use to check blood sugar twice a day.  Dx code: E11.9 1 kit 0   COD LIVER OIL PO Take 1 tablet by mouth daily.     COVID-19 mRNA bivalent vaccine, Pfizer, (PFIZER COVID-19 VAC BIVALENT) injection Inject into the muscle. 0.3 mL 0   dicyclomine (BENTYL) 20 MG tablet TAKE 1 TABLET TWICE DAILY 180 tablet 10   escitalopram (LEXAPRO) 10 MG tablet TAKE 1 TABLET EVERY DAY 90 tablet 1   fenofibrate (TRICOR) 145 MG tablet TAKE 1 TABLET EVERY DAY 90 tablet 1   finasteride (PROSCAR) 5 MG tablet Take 5 mg by mouth daily.     Garlic Oil 8270 MG CAPS Take 1 capsule by mouth daily.     glucose blood (TRUE METRIX BLOOD GLUCOSE TEST) test strip Use to check blood sugar twice a day.  Dx code: E11.9 200 each 1   hydrochlorothiazide (MICROZIDE) 12.5 MG capsule TAKE 1 CAPSULE EVERY DAY 90 capsule 1   HYDROcodone-acetaminophen (NORCO) 10-325 MG tablet Take 1 tablet by mouth 2 (two) times daily as needed. 30 tablet 0   latanoprost (XALATAN) 0.005 % ophthalmic solution Place 1 drop into both eyes nightly.     metFORMIN (GLUCOPHAGE) 500 MG tablet TAKE 1 TABLET TWICE DAILY WITH A MEAL 180 tablet 1   metoprolol succinate (TOPROL-XL) 25 MG 24 hr tablet TAKE 1 TABLET EVERY DAY 90 tablet 1   Multiple Vitamins-Minerals (ONE-A-DAY MENS HEALTH FORMULA) TABS Take 1 tablet by mouth daily.     promethazine (PHENERGAN) 25 MG tablet TAKE ONE (1) TABLET(S) EVERY FOUR (4) HOURS AS NEEDED FOR NAUSEA 60 tablet 0   rOPINIRole (REQUIP) 1 MG tablet TAKE 1 TO 2 TABLETS AT BEDTIME 180 tablet 1   sildenafil (VIAGRA) 100 MG tablet Take 100 mg by mouth as needed.     tamsulosin (FLOMAX) 0.4 MG  CAPS capsule TAKE 1 CAPSULE EVERY DAY AFTER SUPPER 90 capsule 1   TRUEplus Lancets 33G MISC Use to check blood sugar twice a day.  Dx code: E11.9 200 each 1   No facility-administered medications prior to visit.   No Known Allergies  Review of Systems  Constitutional:  Negative for chills and fever.  HENT:  Negative for congestion.   Respiratory:  Negative for shortness of breath.   Cardiovascular:  Negative for chest pain and palpitations.  Gastrointestinal:  Negative for abdominal pain, blood in stool, constipation, diarrhea, nausea and vomiting.  Genitourinary:  Negative for dysuria, frequency, hematuria and urgency.  Skin:           Neurological:  Negative for headaches.      Objective:    Physical Exam Constitutional:      General: He is not in acute distress.    Appearance: Normal appearance. He is normal weight. He is not ill-appearing.  HENT:     Head: Normocephalic and atraumatic.     Right Ear: Tympanic membrane, ear canal and external ear normal.     Left Ear: Tympanic membrane, ear canal and external ear normal.     Nose: Nose normal.     Mouth/Throat:     Mouth: Mucous membranes are moist.     Pharynx: Oropharynx is clear. Posterior oropharyngeal erythema present.  Eyes:     General:        Right eye: No discharge.        Left eye: No discharge.     Extraocular Movements: Extraocular movements intact.     Conjunctiva/sclera: Conjunctivae normal.     Pupils: Pupils are equal, round, and reactive to light.  Cardiovascular:     Rate and Rhythm: Normal rate and regular rhythm.     Pulses: Normal pulses.     Heart sounds: Normal heart sounds. No murmur heard.    No gallop.  Pulmonary:     Effort: Pulmonary effort is normal. No respiratory distress.     Breath sounds: Normal breath sounds. No wheezing or rales.  Abdominal:     General: Bowel sounds are normal.     Palpations: Abdomen is soft.     Tenderness: There is no abdominal tenderness. There is no  guarding.  Musculoskeletal:        General: Normal range of motion.     Cervical back: Normal range of motion.     Right lower leg: No edema.     Left lower leg: No edema.  Skin:    General: Skin is warm and dry.  Neurological:     Mental Status: He is alert and oriented to person, place, and time.  Psychiatric:        Mood and Affect: Mood normal.        Behavior: Behavior normal.        Judgment: Judgment normal.    There were no vitals taken for this visit. Wt Readings from Last 3 Encounters:  08/24/22 203 lb (92.1 kg)  08/17/22 207 lb (93.9 kg)  07/11/22 206 lb 9.6 oz (93.7 kg)   Diabetic Foot Exam - Simple   No data filed    Lab Results  Component Value Date   WBC 5.5 08/24/2022   HGB 13.1 08/24/2022   HCT 40.1 08/24/2022   PLT 185.0 08/24/2022   GLUCOSE 78 08/24/2022   CHOL 101 08/24/2022   TRIG 142.0 08/24/2022   HDL 35.00 (L) 08/24/2022   LDLDIRECT 93.0 04/18/2021   LDLCALC 38 08/24/2022   ALT 22 08/24/2022   AST 27 08/24/2022   NA 136 08/24/2022   K 4.2 08/24/2022  CL 102 08/24/2022   CREATININE 1.57 (H) 08/24/2022   BUN 19 08/24/2022   CO2 24 08/24/2022   TSH 2.62 08/24/2022   PSA 7.58 (H) 10/24/2021   HGBA1C 6.4 08/24/2022   MICROALBUR <0.7 05/04/2022   Lab Results  Component Value Date   TSH 2.62 08/24/2022   Lab Results  Component Value Date   WBC 5.5 08/24/2022   HGB 13.1 08/24/2022   HCT 40.1 08/24/2022   MCV 79.1 08/24/2022   PLT 185.0 08/24/2022   Lab Results  Component Value Date   NA 136 08/24/2022   K 4.2 08/24/2022   CO2 24 08/24/2022   GLUCOSE 78 08/24/2022   BUN 19 08/24/2022   CREATININE 1.57 (H) 08/24/2022   BILITOT 0.4 08/24/2022   ALKPHOS 48 08/24/2022   AST 27 08/24/2022   ALT 22 08/24/2022   PROT 7.5 08/24/2022   ALBUMIN 3.7 08/24/2022   CALCIUM 9.8 08/24/2022   GFR 42.22 (L) 08/24/2022   Lab Results  Component Value Date   CHOL 101 08/24/2022   Lab Results  Component Value Date   HDL 35.00 (L)  08/24/2022   Lab Results  Component Value Date   LDLCALC 38 08/24/2022   Lab Results  Component Value Date   TRIG 142.0 08/24/2022   Lab Results  Component Value Date   CHOLHDL 3 08/24/2022   Lab Results  Component Value Date   HGBA1C 6.4 08/24/2022      Assessment & Plan:   Diet Patient has been informed about incorporating more protein and water into his diet.  Immunizations Patient has been informed about receiving COVID-19, Influenza, Prevnar 110, RSV, and Shingles immunizations.  Labs Kidney markers will be checked today and routine blood work will be checked in 02/2023.  Upper Respiratory Infection Patient has been advised to take Mucinex to manage his upper respiratory infection.  Problem List Items Addressed This Visit   None  No orders of the defined types were placed in this encounter.  I, Daniel Gates, personally preformed the services described in this documentation.  All medical record entries made by the scribe were at my direction and in my presence.  I have reviewed the chart and discharge instructions (if applicable) and agree that the record reflects my personal performance and is accurate and complete. 11/07/2022  I,Mohammed Iqbal,acting as a scribe for Penni Homans, MD.,have documented all relevant documentation on the behalf of Penni Homans, MD,as directed by  Penni Homans, MD while in the presence of Penni Homans, MD.  Daniel Gates

## 2022-11-07 NOTE — Patient Instructions (Addendum)
Shingrix is the new shingles shot, 2 shots over 2-6 months, confirm coverage with insurance and document, then can return here for shots with nurse appt or at pharmacy  RSV (respiratory syncitial virus) vaccine at pharmacy, Arexvy Covid booster when at pharmacy High dose flu shot  Prevnar 20 once   Upper Respiratory Infection, Adult An upper respiratory infection (URI) is a common viral infection of the nose, throat, and upper air passages that lead to the lungs. The most common type of URI is the common cold. URIs usually get better on their own, without medical treatment. What are the causes? A URI is caused by a virus. You may catch a virus by: Breathing in droplets from an infected person's cough or sneeze. Touching something that has been exposed to the virus (is contaminated) and then touching your mouth, nose, or eyes. What increases the risk? You are more likely to get a URI if: You are very young or very old. You have close contact with others, such as at work, school, or a health care facility. You smoke. You have long-term (chronic) heart or lung disease. You have a weakened disease-fighting system (immune system). You have nasal allergies or asthma. You are experiencing a lot of stress. You have poor nutrition. What are the signs or symptoms? A URI usually involves some of the following symptoms: Runny or stuffy (congested) nose. Cough. Sneezing. Sore throat. Headache. Fatigue. Fever. Loss of appetite. Pain in your forehead, behind your eyes, and over your cheekbones (sinus pain). Muscle aches. Redness or irritation of the eyes. Pressure in the ears or face. How is this diagnosed? This condition may be diagnosed based on your medical history and symptoms, and a physical exam. Your health care provider may use a swab to take a mucus sample from your nose (nasal swab). This sample can be tested to determine what virus is causing the illness. How is this treated? URIs  usually get better on their own within 7-10 days. Medicines cannot cure URIs, but your health care provider may recommend certain medicines to help relieve symptoms, such as: Over-the-counter cold medicines. Cough suppressants. Coughing is a type of defense against infection that helps to clear the respiratory system, so take these medicines only as recommended by your health care provider. Fever-reducing medicines. Follow these instructions at home: Activity Rest as needed. If you have a fever, stay home from work or school until your fever is gone or until your health care provider says your URI cannot spread to other people (is no longer contagious). Your health care provider may have you wear a face mask to prevent your infection from spreading. Relieving symptoms Gargle with a mixture of salt and water 3-4 times a day or as needed. To make salt water, completely dissolve -1 tsp (3-6 g) of salt in 1 cup (237 mL) of warm water. Use a cool-mist humidifier to add moisture to the air. This can help you breathe more easily. Eating and drinking  Drink enough fluid to keep your urine pale yellow. Eat soups and other clear broths. General instructions  Take over-the-counter and prescription medicines only as told by your health care provider. These include cold medicines, fever reducers, and cough suppressants. Do not use any products that contain nicotine or tobacco. These products include cigarettes, chewing tobacco, and vaping devices, such as e-cigarettes. If you need help quitting, ask your health care provider. Stay away from secondhand smoke. Stay up to date on all immunizations, including the yearly (annual) flu  vaccine. Keep all follow-up visits. This is important. How to prevent the spread of infection to others URIs can be contagious. To prevent the infection from spreading: Wash your hands with soap and water for at least 20 seconds. If soap and water are not available, use hand  sanitizer. Avoid touching your mouth, face, eyes, or nose. Cough or sneeze into a tissue or your sleeve or elbow instead of into your hand or into the air.  Contact a health care provider if: You are getting worse instead of better. You have a fever or chills. Your mucus is brown or red. You have yellow or brown discharge coming from your nose. You have pain in your face, especially when you bend forward. You have swollen neck glands. You have pain while swallowing. You have white areas in the back of your throat. Get help right away if: You have shortness of breath that gets worse. You have severe or persistent: Headache. Ear pain. Sinus pain. Chest pain. You have chronic lung disease along with any of the following: Making high-pitched whistling sounds when you breathe, most often when you breathe out (wheezing). Prolonged cough (more than 14 days). Coughing up blood. A change in your usual mucus. You have a stiff neck. You have changes in your: Vision. Hearing. Thinking. Mood. These symptoms may be an emergency. Get help right away. Call 911. Do not wait to see if the symptoms will go away. Do not drive yourself to the hospital. Summary An upper respiratory infection (URI) is a common infection of the nose, throat, and upper air passages that lead to the lungs. A URI is caused by a virus. URIs usually get better on their own within 7-10 days. Medicines cannot cure URIs, but your health care provider may recommend certain medicines to help relieve symptoms. This information is not intended to replace advice given to you by your health care provider. Make sure you discuss any questions you have with your health care provider. Document Revised: 06/22/2021 Document Reviewed: 06/22/2021 Elsevier Patient Education  [place].

## 2022-11-08 ENCOUNTER — Other Ambulatory Visit: Payer: Self-pay

## 2022-11-08 DIAGNOSIS — N289 Disorder of kidney and ureter, unspecified: Secondary | ICD-10-CM

## 2022-11-08 NOTE — Assessment & Plan Note (Signed)
On Levothyroxine, continue to monitor 

## 2022-11-08 NOTE — Assessment & Plan Note (Signed)
resolved 

## 2022-11-08 NOTE — Assessment & Plan Note (Signed)
Supplement and monitor 

## 2022-11-08 NOTE — Assessment & Plan Note (Signed)
Well controlled, no changes to meds. Encouraged heart healthy diet such as the DASH diet and exercise as tolerated.  °

## 2022-11-08 NOTE — Assessment & Plan Note (Signed)
Hydrate and monitor 

## 2022-11-08 NOTE — Assessment & Plan Note (Signed)
Encourage heart healthy diet such as MIND or DASH diet, increase exercise, avoid trans fats, simple carbohydrates and processed foods, consider a krill or fish or flaxseed oil cap daily. Tolerating Atorvastatin 

## 2022-11-24 ENCOUNTER — Other Ambulatory Visit: Payer: Self-pay | Admitting: Family Medicine

## 2022-11-24 DIAGNOSIS — E118 Type 2 diabetes mellitus with unspecified complications: Secondary | ICD-10-CM

## 2022-11-24 DIAGNOSIS — G2581 Restless legs syndrome: Secondary | ICD-10-CM

## 2022-11-24 DIAGNOSIS — D649 Anemia, unspecified: Secondary | ICD-10-CM

## 2022-11-24 DIAGNOSIS — M109 Gout, unspecified: Secondary | ICD-10-CM

## 2022-11-24 DIAGNOSIS — R972 Elevated prostate specific antigen [PSA]: Secondary | ICD-10-CM

## 2022-11-24 DIAGNOSIS — N529 Male erectile dysfunction, unspecified: Secondary | ICD-10-CM

## 2022-11-24 DIAGNOSIS — E559 Vitamin D deficiency, unspecified: Secondary | ICD-10-CM

## 2022-12-11 DIAGNOSIS — H401131 Primary open-angle glaucoma, bilateral, mild stage: Secondary | ICD-10-CM | POA: Diagnosis not present

## 2023-01-11 ENCOUNTER — Encounter: Payer: Self-pay | Admitting: Urology

## 2023-01-11 ENCOUNTER — Ambulatory Visit: Payer: Medicare Other | Admitting: Urology

## 2023-01-11 VITALS — BP 130/89 | HR 52 | Ht 72.0 in | Wt 212.0 lb

## 2023-01-11 DIAGNOSIS — N529 Male erectile dysfunction, unspecified: Secondary | ICD-10-CM

## 2023-01-11 DIAGNOSIS — R972 Elevated prostate specific antigen [PSA]: Secondary | ICD-10-CM | POA: Diagnosis not present

## 2023-01-11 DIAGNOSIS — N138 Other obstructive and reflux uropathy: Secondary | ICD-10-CM | POA: Diagnosis not present

## 2023-01-11 DIAGNOSIS — N401 Enlarged prostate with lower urinary tract symptoms: Secondary | ICD-10-CM | POA: Diagnosis not present

## 2023-01-11 LAB — URINALYSIS
Bilirubin, UA: NEGATIVE
Blood, UA: NEGATIVE
Glucose, UA: NEGATIVE mg/dL
Ketones, POC UA: NEGATIVE mg/dL
Leukocytes, UA: NEGATIVE
Nitrite, UA: NEGATIVE
Protein Ur, POC: NEGATIVE mg/dL
Spec Grav, UA: 1.02 (ref 1.010–1.025)
Urobilinogen, UA: 1 E.U./dL
pH, UA: 6 (ref 5.0–8.0)

## 2023-01-11 NOTE — Progress Notes (Signed)
Assessment: 1. Elevated PSA   2. Benign prostatic hyperplasia with urinary obstruction   3. Erectile dysfunction, unspecified erectile dysfunction type      Plan: Patient will continue combination medical therapy with tamsulosin and finasteride Continue sildenafil as needed for ED Patient will follow-up in 1 year or sooner if problems arise  Chief Complaint: No chief complaint on file.   History of Present Illness:  Daniel Gates is a 77 y.o. male who is seen today regarding elevated PSA and BPH.  He also has erectile dysfunction.  Patient is well-known to me from prior visits in [facility] last seen by me in 2020.  In the interval.  He has been followed by Dr. Diona Fanti in [place].  Was seen by him last month.  He prescribed viagra for ED.  This works well.  Was here today to reestablish care with me going forward.  DRE last mo= 80gm benign to palpation  Patient has a long history of BPH and elevated PSA.  He continues to take tamsulosin and finasteride and reports stable lower urinary tract symptoms.  Current IPSS = 14 Analysis today is clear. Has large prostate.  Patient restarted tam + fin 03/2018   Patient has fam hx of cap in father and brother Patient is status post prior truss/biopsy in 2004 and 2005 (psa of 7) which was benign.   PSA data: 10/2007-12.34 10/2008-8.16 11/2009-10.64 02/2011-5.07 12/2011 3.76 10/2013 6.43 11/2014 2.49 10/2015 4.54 06/2016-6.7 09/2016-7.46 02/2018-9.10 (restarted finasteride 03/2018) 07/2018 6.63 06/2019 6.51  12/2022  4.37(35% free)     Past Medical History:  Past Medical History:  Diagnosis Date   Allergic rhinitis    Anxiety 2003   "after cancer surgery"   Asthma    childhood per pt.   Bradycardia 10/01/2016   Cancer ([place])    colon; diagnosed 2003   Cataract 12/29/2016   not sure which eye   Chronic renal insufficiency 06/07/2017   Depression    Diabetes type 2, controlled ([place]) 06/27/2012   Glaucoma    Gout     Hyperlipidemia    Hypertension    Medicare annual wellness visit, subsequent 02/15/2014   Open-angle glaucoma 12/29/2016   Sickle cell anemia ([place]) 06/12/2017   "they say I have a trace"   TMJ arthralgia 01/02/2016    Past Surgical History:  Past Surgical History:  Procedure Laterality Date   COLON SURGERY  2003   for colon cancer   COLONOSCOPY     INGUINAL HERNIA REPAIR  1997   right    Allergies:  No Known Allergies  Family History:  Family History  Problem Relation Age of Onset   Diabetes Mother    Heart disease Mother        MI   Cancer Father        prostate   Prostate cancer Father    Dementia Sister    Thyroid disease Sister    Diabetes Sister    Heart disease Brother        CAD s/p CABG   Prostate cancer Brother    Colon cancer Neg Hx    Stomach cancer Neg Hx    Esophageal cancer Neg Hx    Rectal cancer Neg Hx     Social History:  Social History   Tobacco Use   Smoking status: Never   Smokeless tobacco: Never  Vaping Use   Vaping Use: Never used  Substance Use Topics   Alcohol use: No   Drug use:  No    Review of symptoms:  Constitutional:  Negative for unexplained weight loss, night sweats, fever, chills ENT:  Negative for nose bleeds, sinus pain, painful swallowing CV:  Negative for chest pain, shortness of breath, exercise intolerance, palpitations, loss of consciousness Resp:  Negative for cough, wheezing, shortness of breath GI:  Negative for nausea, vomiting, diarrhea, bloody stools GU:  Positives noted in HPI; otherwise negative for gross hematuria, dysuria, urinary incontinence Neuro:  Negative for seizures, poor balance, limb weakness, slurred speech Psych:  Negative for lack of energy, depression, anxiety Endocrine:  Negative for polydipsia, polyuria, symptoms of hypoglycemia (dizziness, hunger, sweating) Hematologic:  Negative for anemia, purpura, petechia, prolonged or excessive bleeding, use of anticoagulants  Allergic:  Negative for  difficulty breathing or choking as a result of exposure to anything; no shellfish allergy; no allergic response (rash/itch) to materials, foods  Physical exam: BP 130/89   Pulse (!) 52   Ht 6' (1.829 m)   Wt 212 lb (96.2 kg)   BMI 28.75 kg/m  GENERAL APPEARANCE:  Well appearing, well developed, well nourished, NAD

## 2023-03-27 ENCOUNTER — Other Ambulatory Visit: Payer: Self-pay

## 2023-03-27 MED ORDER — ALLOPURINOL 100 MG PO TABS: 100.0000 mg | ORAL_TABLET | Freq: Every day | ORAL | 0 refills | Status: DC

## 2023-03-27 MED ORDER — TRUE METRIX BLOOD GLUCOSE TEST VI STRP: ORAL_STRIP | 1 refills | Status: DC

## 2023-03-27 MED ORDER — HYDROCHLOROTHIAZIDE 12.5 MG PO CAPS: 12.5000 mg | ORAL_CAPSULE | Freq: Every day | ORAL | 3 refills | Status: DC

## 2023-03-27 MED ORDER — LATANOPROST 0.005 % OP SOLN: OPHTHALMIC | 0 refills | Status: AC

## 2023-03-30 ENCOUNTER — Other Ambulatory Visit: Payer: Self-pay

## 2023-03-30 DIAGNOSIS — N529 Male erectile dysfunction, unspecified: Secondary | ICD-10-CM

## 2023-03-30 DIAGNOSIS — G2581 Restless legs syndrome: Secondary | ICD-10-CM

## 2023-03-30 DIAGNOSIS — D649 Anemia, unspecified: Secondary | ICD-10-CM

## 2023-03-30 DIAGNOSIS — M109 Gout, unspecified: Secondary | ICD-10-CM

## 2023-03-30 DIAGNOSIS — E118 Type 2 diabetes mellitus with unspecified complications: Secondary | ICD-10-CM

## 2023-03-30 DIAGNOSIS — R972 Elevated prostate specific antigen [PSA]: Secondary | ICD-10-CM

## 2023-03-30 DIAGNOSIS — E559 Vitamin D deficiency, unspecified: Secondary | ICD-10-CM

## 2023-03-30 MED ORDER — ATORVASTATIN CALCIUM 40 MG PO TABS: 40.0000 mg | ORAL_TABLET | Freq: Every day | ORAL | 1 refills | Status: DC

## 2023-03-30 MED ORDER — METOPROLOL SUCCINATE ER 25 MG PO TB24: ORAL_TABLET | ORAL | 1 refills | Status: DC

## 2023-03-30 MED ORDER — METFORMIN HCL 500 MG PO TABS
ORAL_TABLET | ORAL | 1 refills | Status: DC
Start: 2023-03-30 — End: 2023-06-08

## 2023-04-07 ENCOUNTER — Other Ambulatory Visit: Payer: Self-pay | Admitting: Family Medicine

## 2023-04-13 ENCOUNTER — Other Ambulatory Visit: Payer: Self-pay

## 2023-04-13 MED ORDER — ROPINIROLE HCL 1 MG PO TABS: ORAL_TABLET | ORAL | 1 refills | Status: DC

## 2023-04-13 MED ORDER — TRUE METRIX BLOOD GLUCOSE TEST VI STRP: ORAL_STRIP | 1 refills | Status: AC

## 2023-04-13 MED ORDER — PROMETHAZINE HCL 25 MG PO TABS: ORAL_TABLET | ORAL | 0 refills | Status: AC

## 2023-04-17 ENCOUNTER — Other Ambulatory Visit: Payer: Self-pay | Admitting: Family Medicine

## 2023-04-25 DIAGNOSIS — H02831 Dermatochalasis of right upper eyelid: Secondary | ICD-10-CM | POA: Diagnosis not present

## 2023-04-25 DIAGNOSIS — H524 Presbyopia: Secondary | ICD-10-CM | POA: Diagnosis not present

## 2023-04-25 DIAGNOSIS — H2513 Age-related nuclear cataract, bilateral: Secondary | ICD-10-CM | POA: Diagnosis not present

## 2023-04-25 DIAGNOSIS — H35033 Hypertensive retinopathy, bilateral: Secondary | ICD-10-CM | POA: Diagnosis not present

## 2023-04-25 DIAGNOSIS — H52203 Unspecified astigmatism, bilateral: Secondary | ICD-10-CM | POA: Diagnosis not present

## 2023-04-25 DIAGNOSIS — H02834 Dermatochalasis of left upper eyelid: Secondary | ICD-10-CM | POA: Diagnosis not present

## 2023-04-25 DIAGNOSIS — H401131 Primary open-angle glaucoma, bilateral, mild stage: Secondary | ICD-10-CM | POA: Diagnosis not present

## 2023-04-25 DIAGNOSIS — E119 Type 2 diabetes mellitus without complications: Secondary | ICD-10-CM | POA: Diagnosis not present

## 2023-04-25 DIAGNOSIS — H5212 Myopia, left eye: Secondary | ICD-10-CM | POA: Diagnosis not present

## 2023-04-25 DIAGNOSIS — Z7984 Long term (current) use of oral hypoglycemic drugs: Secondary | ICD-10-CM | POA: Diagnosis not present

## 2023-04-25 DIAGNOSIS — H5201 Hypermetropia, right eye: Secondary | ICD-10-CM | POA: Diagnosis not present

## 2023-05-03 ENCOUNTER — Telehealth: Payer: Self-pay | Admitting: Family Medicine

## 2023-05-03 ENCOUNTER — Other Ambulatory Visit: Payer: Self-pay

## 2023-05-03 MED ORDER — FINASTERIDE 5 MG PO TABS: 5.0000 mg | ORAL_TABLET | Freq: Every day | ORAL | 4 refills | Status: DC

## 2023-05-03 MED ORDER — FENOFIBRATE 145 MG PO TABS: 145.0000 mg | ORAL_TABLET | Freq: Every day | ORAL | 1 refills | Status: DC

## 2023-05-03 MED ORDER — DICYCLOMINE HCL 20 MG PO TABS: ORAL_TABLET | ORAL | 10 refills | Status: DC

## 2023-05-03 NOTE — Telephone Encounter (Signed)
Prescription Request  05/03/2023  Is this a "Controlled Substance" medicine? No  LOV: 11/07/2022  What is the name of the medication or equipment?  1.fenofibrate (TRICOR) 145 MG tablet  2.dicyclomine (BENTYL) 20 MG tablet  3.tamsulosin (FLOMAX) 0.4 MG CAPS capsule   Have you contacted your pharmacy to request a refill? Yes   Which pharmacy would you like this sent to?   [facility] - [place], [place] - [address] [place] [address] Phone: [phone] Fax: [phone]    Patient notified that their request is being sent to the clinical staff for review and that they should receive a response within 2 business days.   Please advise at [facility] [phone]

## 2023-05-03 NOTE — Telephone Encounter (Signed)
Refills has been sent 

## 2023-05-24 ENCOUNTER — Telehealth: Payer: Self-pay

## 2023-05-24 ENCOUNTER — Other Ambulatory Visit: Payer: Self-pay | Admitting: Family Medicine

## 2023-05-24 ENCOUNTER — Other Ambulatory Visit: Payer: Self-pay

## 2023-05-24 MED ORDER — FENOFIBRATE 145 MG PO TABS: 145.0000 mg | ORAL_TABLET | Freq: Every day | ORAL | 1 refills | Status: DC

## 2023-05-24 MED ORDER — FINASTERIDE 5 MG PO TABS: 5.0000 mg | ORAL_TABLET | Freq: Every day | ORAL | 4 refills | Status: DC

## 2023-05-24 MED ORDER — DICYCLOMINE HCL 20 MG PO TABS: ORAL_TABLET | ORAL | 10 refills | Status: DC

## 2023-05-24 MED ORDER — HYDROCHLOROTHIAZIDE 12.5 MG PO CAPS: 12.5000 mg | ORAL_CAPSULE | Freq: Every day | ORAL | 3 refills | Status: AC

## 2023-05-24 NOTE — Telephone Encounter (Signed)
Initial Comment The caller states he has been out of his rx for a month he is needing a refill. The rx is [facility] and the pt has a history of prostate issues. Translation No Disp. Time Lamount Cohen Time) Disposition Final User 05/23/2023 5:43:20 PM Attempt made - message left Theora Master 05/23/2023 5:50:54 PM FINAL ATTEMPT MADE - message left Yes Melida Quitter RN, Victorino Dike Final Disposition 05/23/2023 5:50:54 PM FINAL ATTEMPT MADE - message left Yes Melida Quitter, RN, Victorino Dike

## 2023-05-24 NOTE — Telephone Encounter (Signed)
Called pt refill was sent

## 2023-05-28 ENCOUNTER — Other Ambulatory Visit: Payer: Self-pay | Admitting: Family Medicine

## 2023-06-07 ENCOUNTER — Other Ambulatory Visit: Payer: Self-pay | Admitting: Family Medicine

## 2023-06-07 DIAGNOSIS — R972 Elevated prostate specific antigen [PSA]: Secondary | ICD-10-CM

## 2023-06-07 DIAGNOSIS — E118 Type 2 diabetes mellitus with unspecified complications: Secondary | ICD-10-CM

## 2023-06-07 DIAGNOSIS — G2581 Restless legs syndrome: Secondary | ICD-10-CM

## 2023-06-07 DIAGNOSIS — N529 Male erectile dysfunction, unspecified: Secondary | ICD-10-CM

## 2023-06-07 DIAGNOSIS — M109 Gout, unspecified: Secondary | ICD-10-CM

## 2023-06-07 DIAGNOSIS — D649 Anemia, unspecified: Secondary | ICD-10-CM

## 2023-06-07 DIAGNOSIS — E559 Vitamin D deficiency, unspecified: Secondary | ICD-10-CM

## 2023-07-02 NOTE — Assessment & Plan Note (Signed)
Supplement and monitor 

## 2023-07-02 NOTE — Assessment & Plan Note (Signed)
Hydrate and monitor 

## 2023-07-02 NOTE — Assessment & Plan Note (Signed)
Well controlled, no changes to meds. Encouraged heart healthy diet such as the DASH diet and exercise as tolerated.  °

## 2023-07-02 NOTE — Assessment & Plan Note (Signed)
Encourage heart healthy diet such as MIND or DASH diet, increase exercise, avoid trans fats, simple carbohydrates and processed foods, consider a krill or fish or flaxseed oil cap daily. Tolerating Atorvastatin 

## 2023-07-02 NOTE — Assessment & Plan Note (Signed)
On Levothyroxine, continue to monitor 

## 2023-07-02 NOTE — Assessment & Plan Note (Signed)
hgba1c acceptable, minimize simple carbs. Increase exercise as tolerated. Continue current meds 

## 2023-07-02 NOTE — Progress Notes (Signed)
Subjective:    Patient ID: Daniel Gates, male    DOB: 1946/02/16, 77 y.o.   MRN: [id number]  Chief Complaint  Patient presents with   Follow-up    Follow up    HPI Discussed the use of AI scribe software for clinical note transcription with the patient, who gave verbal consent to proceed.  History of Present Illness   Patient is a 77 yo male in today for follow up on chronic medical concerns. No recent febrile illness or hospitalizations. Denies CP/palp/SOB/HA/congestion/fevers/GI or GU c/o. Taking meds as prescribed  The patient, with a past medical history of hypertension and kidney issues, presents with a complaint of right knee pain that started in early June. The pain was initially severe but has since eased off and is currently not bothering him. He denies any specific injury that might have triggered the pain. He also reports feeling weak and tired for the past few months, which has affected his ability to work. He has lost weight and has had loose bowel movements. He also reports feeling dizzy at times, which can occur even when he is sitting down. He has not been able to eat as much as usual due to his symptoms. He has been using aspirin cream and other creams for his knee pain.        Past Medical History:  Diagnosis Date   Allergic rhinitis    Anxiety 2003   "after cancer surgery"   Asthma    childhood per pt.   Bradycardia 10/01/2016   Cancer ([facility])    colon; diagnosed 2003   Cataract 12/29/2016   not sure which eye   Chronic renal insufficiency 06/07/2017   Depression    Diabetes type 2, controlled ([facility]) 06/27/2012   Glaucoma    Gout    Hyperlipidemia    Hypertension    Medicare annual wellness visit, subsequent 02/15/2014   Open-angle glaucoma 12/29/2016   Sickle cell anemia ([facility]) 06/12/2017   "they say I have a trace"   TMJ arthralgia 01/02/2016    Past Surgical History:  Procedure Laterality Date   COLON SURGERY  2003   for colon cancer   COLONOSCOPY      INGUINAL HERNIA REPAIR  1997   right    Family History  Problem Relation Age of Onset   Diabetes Mother    Heart disease Mother        MI   Cancer Father        prostate   Prostate cancer Father    Dementia Sister    Thyroid disease Sister    Diabetes Sister    Heart disease Brother        CAD s/p CABG   Prostate cancer Brother    Colon cancer Neg Hx    Stomach cancer Neg Hx    Esophageal cancer Neg Hx    Rectal cancer Neg Hx     Social History   Socioeconomic History   Marital status: Widowed    Spouse name: Not on file   Number of children: 7   Years of education: Not on file   Highest education level: Not on file  Occupational History    Employer: DISABLED  Tobacco Use   Smoking status: Never   Smokeless tobacco: Never  Vaping Use   Vaping status: Never Used  Substance and Sexual Activity   Alcohol use: No   Drug use: No   Sexual activity: Yes  Other Topics Concern  Not on file  Social History Narrative   Not on file   Social Determinants of Health   Financial Resource Strain: Low Risk  (07/07/2021)   Overall Financial Resource Strain (CARDIA)    Difficulty of Paying Living Expenses: Not hard at all  Food Insecurity: No Food Insecurity (07/07/2021)   Hunger Vital Sign    Worried About Running Out of Food in the Last Year: Never true    Ran Out of Food in the Last Year: Never true  Transportation Needs: No Transportation Needs (07/07/2021)   PRAPARE - Administrator, Civil Service (Medical): No    Lack of Transportation (Non-Medical): No  Physical Activity: Inactive (07/07/2021)   Exercise Vital Sign    Days of Exercise per Week: 0 days    Minutes of Exercise per Session: 0 min  Stress: No Stress Concern Present (07/07/2021)   [employer] of Occupational Health - Occupational Stress Questionnaire    Feeling of Stress : Not at all  Social Connections: Moderately Isolated (07/07/2021)   Social Connection and Isolation Panel [NHANES]     Frequency of Communication with Friends and Family: More than three times a week    Frequency of Social Gatherings with Friends and Family: More than three times a week    Attends Religious Services: More than 4 times per year    Active Member of [employer] or Organizations: No    Attends Banker Meetings: Never    Marital Status: Widowed  Intimate Partner Violence: Not At Risk (07/07/2021)   Humiliation, Afraid, Rape, and Kick questionnaire    Fear of Current or Ex-Partner: No    Emotionally Abused: No    Physically Abused: No    Sexually Abused: No    Outpatient Medications Prior to Visit  Medication Sig Dispense Refill   allopurinol (ZYLOPRIM) 100 MG tablet TAKE 1 TABLET BY MOUTH DAILY 90 tablet 3   aspirin 325 MG tablet Take 325 mg by mouth daily.     atorvastatin (LIPITOR) 40 MG tablet Take 1 tablet (40 mg total) by mouth at bedtime. 90 tablet 1   Blood Glucose Monitoring Suppl (TRUE METRIX AIR GLUCOSE METER) w/Device KIT Use to check blood sugar twice a day.  Dx code: E11.9 1 kit 0   COD LIVER OIL PO Take 1 tablet by mouth daily.     COVID-19 mRNA bivalent vaccine, Pfizer, (PFIZER COVID-19 VAC BIVALENT) injection Inject into the muscle. 0.3 mL 0   dicyclomine (BENTYL) 20 MG tablet TAKE 1 TABLET TWICE DAILY 180 tablet 10   escitalopram (LEXAPRO) 10 MG tablet TAKE 1 TABLET EVERY DAY 90 tablet 1   fenofibrate (TRICOR) 145 MG tablet Take 1 tablet (145 mg total) by mouth daily. 90 tablet 1   finasteride (PROSCAR) 5 MG tablet Take 1 tablet (5 mg total) by mouth daily. Take 5 mg by mouth daily. 30 tablet 4   Garlic Oil 1000 MG CAPS Take 1 capsule by mouth daily.     glucose blood (TRUE METRIX BLOOD GLUCOSE TEST) test strip Use to check blood sugar twice a day.  Dx code: E11.9 200 each 1   hydrochlorothiazide (MICROZIDE) 12.5 MG capsule Take 1 capsule (12.5 mg total) by mouth daily. 90 capsule 3   HYDROcodone-acetaminophen (NORCO) 10-325 MG tablet Take 1 tablet by mouth 2 (two) times  daily as needed. 30 tablet 0   latanoprost (XALATAN) 0.005 % ophthalmic solution Place 1 drop into both eyes nightly. 2.5 mL 0  metFORMIN (GLUCOPHAGE) 500 MG tablet TAKE 1 TABLET BY MOUTH TWICE  DAILY WITH MEALS 200 tablet 2   Multiple Vitamins-Minerals (ONE-A-DAY MENS HEALTH FORMULA) TABS Take 1 tablet by mouth daily.     promethazine (PHENERGAN) 25 MG tablet TAKE ONE (1) TABLET(S) EVERY FOUR (4) HOURS AS NEEDED FOR NAUSEA 60 tablet 0   rOPINIRole (REQUIP) 1 MG tablet TAKE 1 TO 2 TABLETS BY MOUTH AT  BEDTIME 200 tablet 2   sildenafil (VIAGRA) 100 MG tablet Take 100 mg by mouth as needed.     TRUEplus Lancets 33G MISC Use to check blood sugar twice a day.  Dx code: E11.9 200 each 1   metoprolol succinate (TOPROL-XL) 25 MG 24 hr tablet Take 1 tablet (25 mg total) by mouth daily. Take with or immediately following a meal 90 tablet 1   tamsulosin (FLOMAX) 0.4 MG CAPS capsule TAKE 1 CAPSULE EVERY DAY AFTER SUPPER 90 capsule 3   No facility-administered medications prior to visit.    No Known Allergies  ROS     Objective:    Physical Exam  BP 128/82 (BP Location: Left Arm, Patient Position: Sitting, Cuff Size: Normal)   Pulse (!) 55   Temp 98 F (36.7 C) (Oral)   Resp 16   Ht 6\' 1"  (1.854 m)   Wt 191 lb 3.2 oz (86.7 kg)   SpO2 95%   BMI 25.23 kg/m  Wt Readings from Last 3 Encounters:  07/03/23 191 lb 3.2 oz (86.7 kg)  01/11/23 212 lb (96.2 kg)  11/07/22 205 lb (93 kg)    Diabetic Foot Exam - Simple   No data filed    Lab Results  Component Value Date   WBC 7.1 07/03/2023   HGB 13.8 07/03/2023   HCT 43.5 07/03/2023   PLT 241.0 07/03/2023   GLUCOSE 98 07/03/2023   CHOL 117 07/03/2023   TRIG 183.0 (H) 07/03/2023   HDL 35.10 (L) 07/03/2023   LDLDIRECT 93.0 04/18/2021   LDLCALC 45 07/03/2023   ALT 17 07/03/2023   AST 24 07/03/2023   NA 138 07/03/2023   K 4.6 07/03/2023   CL 103 07/03/2023   CREATININE 1.93 (H) 07/03/2023   BUN 25 (H) 07/03/2023   CO2 27 07/03/2023    TSH 4.64 07/03/2023   PSA 7.58 (H) 10/24/2021   HGBA1C 6.3 07/03/2023   MICROALBUR <0.7 07/03/2023    Lab Results  Component Value Date   TSH 4.64 07/03/2023   Lab Results  Component Value Date   WBC 7.1 07/03/2023   HGB 13.8 07/03/2023   HCT 43.5 07/03/2023   MCV 79.7 07/03/2023   PLT 241.0 07/03/2023   Lab Results  Component Value Date   NA 138 07/03/2023   K 4.6 07/03/2023   CO2 27 07/03/2023   GLUCOSE 98 07/03/2023   BUN 25 (H) 07/03/2023   CREATININE 1.93 (H) 07/03/2023   BILITOT 0.4 07/03/2023   ALKPHOS 51 07/03/2023   AST 24 07/03/2023   ALT 17 07/03/2023   PROT 8.1 07/03/2023   ALBUMIN 4.1 07/03/2023   CALCIUM 10.1 07/03/2023   GFR 32.76 (L) 07/03/2023   Lab Results  Component Value Date   CHOL 117 07/03/2023   Lab Results  Component Value Date   HDL 35.10 (L) 07/03/2023   Lab Results  Component Value Date   LDLCALC 45 07/03/2023   Lab Results  Component Value Date   TRIG 183.0 (H) 07/03/2023   Lab Results  Component Value Date   CHOLHDL  3 07/03/2023   Lab Results  Component Value Date   HGBA1C 6.3 07/03/2023       Assessment & Plan:  Chronic renal impairment, unspecified CKD stage Assessment & Plan: Hydrate and monitor    Essential hypertension Assessment & Plan: Well controlled, no changes to meds. Encouraged heart healthy diet such as the DASH diet and exercise as tolerated.   Orders: -     Comprehensive metabolic panel -     CBC with Differential/Platelet -     TSH  Chronic idiopathic gout involving toe of right foot without tophus Assessment & Plan: Hydrate and monitor    Other hyperlipidemia Assessment & Plan: Encourage heart healthy diet such as MIND or DASH diet, increase exercise, avoid trans fats, simple carbohydrates and processed foods, consider a krill or fish or flaxseed oil cap daily. Tolerating Atorvastatin  Orders: -     Lipid panel  Other specified hypothyroidism Assessment & Plan: On Levothyroxine,  continue to monitor    Non-insulin treated type 2 diabetes mellitus (HCC) Assessment & Plan: hgba1c acceptable, minimize simple carbs. Increase exercise as tolerated. Continue current meds  Orders: -     Hemoglobin A1c -     Microalbumin / creatinine urine ratio  Vitamin D deficiency Assessment & Plan: Supplement and monitor  Orders: -     VITAMIN D 25 Hydroxy (Vit-D Deficiency, Fractures) -     Tamsulosin HCl; TAKE 1 CAPSULE EVERY DAY AFTER SUPPER  Dispense: 90 capsule; Refill: 3  Left knee pain, unspecified chronicity Assessment & Plan: No recent pain   Right knee pain, unspecified chronicity Assessment & Plan: Doing better today but has bothered him intermittently since early June. The swelling has improved. No redness or heat. Responds to [employer] declines referral to ortho for now. Try ice and topical treatments   PROSTATE SPECIFIC ANTIGEN, ELEVATED -     Tamsulosin HCl; TAKE 1 CAPSULE EVERY DAY AFTER SUPPER  Dispense: 90 capsule; Refill: 3  Restless leg syndrome -     Tamsulosin HCl; TAKE 1 CAPSULE EVERY DAY AFTER SUPPER  Dispense: 90 capsule; Refill: 3  Controlled type 2 diabetes mellitus with complication, without long-term current use of insulin (HCC) -     Tamsulosin HCl; TAKE 1 CAPSULE EVERY DAY AFTER SUPPER  Dispense: 90 capsule; Refill: 3  Erectile dysfunction, unspecified erectile dysfunction type -     Tamsulosin HCl; TAKE 1 CAPSULE EVERY DAY AFTER SUPPER  Dispense: 90 capsule; Refill: 3  Elevated PSA -     Tamsulosin HCl; TAKE 1 CAPSULE EVERY DAY AFTER SUPPER  Dispense: 90 capsule; Refill: 3  Anemia -     Tamsulosin HCl; TAKE 1 CAPSULE EVERY DAY AFTER SUPPER  Dispense: 90 capsule; Refill: 3  Acute gouty arthropathy -     Tamsulosin HCl; TAKE 1 CAPSULE EVERY DAY AFTER SUPPER  Dispense: 90 capsule; Refill: 3    Assessment and Plan    Right Knee Pain: Intermittent pain and swelling since early June, no known injury. Currently improved. No redness  or heat. Using topical aspirin cream for relief. -Continue conservative management with ice, topical rubs, and possible use of a neoprene brace. -Consider referral to a specialist if symptoms worsen.  General Fatigue: Reports feeling weak and lethargic, impacting daily activities. No acute illness reported. Recent weight loss noted. -Order comprehensive blood work to assess for potential causes including kidney function, anemia, and blood glucose levels. -Consider referral to nephrology if creatinine levels have increased. -Encourage increased protein intake. -Stop Metoprolol  due to low blood pressure and pulse, and possible contribution to fatigue. -Schedule a nurse visit on August 9th for blood pressure, pulse, and weight check.  Dental Issues: Reports difficulty eating due to dental issues and loss of taste on one side of the mouth. -Encourage patient to follow up with dentist regarding ongoing dental issues.  General Health Maintenance: -Continue with scheduled wellness visit on August 9th. -Consider discussion of vaccinations at future visits. -Schedule follow-up appointment in four months.         Danise Edge, MD

## 2023-07-03 ENCOUNTER — Ambulatory Visit (INDEPENDENT_AMBULATORY_CARE_PROVIDER_SITE_OTHER): Payer: Medicare Other | Admitting: Family Medicine

## 2023-07-03 ENCOUNTER — Other Ambulatory Visit: Payer: Self-pay

## 2023-07-03 VITALS — BP 128/82 | HR 55 | Temp 98.0°F | Resp 16 | Ht 73.0 in | Wt 191.2 lb

## 2023-07-03 DIAGNOSIS — D649 Anemia, unspecified: Secondary | ICD-10-CM

## 2023-07-03 DIAGNOSIS — M1A0710 Idiopathic chronic gout, right ankle and foot, without tophus (tophi): Secondary | ICD-10-CM

## 2023-07-03 DIAGNOSIS — E7849 Other hyperlipidemia: Secondary | ICD-10-CM

## 2023-07-03 DIAGNOSIS — E118 Type 2 diabetes mellitus with unspecified complications: Secondary | ICD-10-CM | POA: Diagnosis not present

## 2023-07-03 DIAGNOSIS — G2581 Restless legs syndrome: Secondary | ICD-10-CM

## 2023-07-03 DIAGNOSIS — I10 Essential (primary) hypertension: Secondary | ICD-10-CM

## 2023-07-03 DIAGNOSIS — E559 Vitamin D deficiency, unspecified: Secondary | ICD-10-CM | POA: Diagnosis not present

## 2023-07-03 DIAGNOSIS — E038 Other specified hypothyroidism: Secondary | ICD-10-CM | POA: Diagnosis not present

## 2023-07-03 DIAGNOSIS — R972 Elevated prostate specific antigen [PSA]: Secondary | ICD-10-CM

## 2023-07-03 DIAGNOSIS — Z7984 Long term (current) use of oral hypoglycemic drugs: Secondary | ICD-10-CM

## 2023-07-03 DIAGNOSIS — M109 Gout, unspecified: Secondary | ICD-10-CM

## 2023-07-03 DIAGNOSIS — M25562 Pain in left knee: Secondary | ICD-10-CM

## 2023-07-03 DIAGNOSIS — N189 Chronic kidney disease, unspecified: Secondary | ICD-10-CM | POA: Diagnosis not present

## 2023-07-03 DIAGNOSIS — M25561 Pain in right knee: Secondary | ICD-10-CM | POA: Diagnosis not present

## 2023-07-03 DIAGNOSIS — N529 Male erectile dysfunction, unspecified: Secondary | ICD-10-CM

## 2023-07-03 DIAGNOSIS — E119 Type 2 diabetes mellitus without complications: Secondary | ICD-10-CM | POA: Diagnosis not present

## 2023-07-03 MED ORDER — TAMSULOSIN HCL 0.4 MG PO CAPS
ORAL_CAPSULE | ORAL | 3 refills | Status: DC
Start: 2023-07-03 — End: 2024-01-17

## 2023-07-03 MED ORDER — TAMSULOSIN HCL 0.4 MG PO CAPS
ORAL_CAPSULE | ORAL | 3 refills | Status: DC
Start: 2023-07-03 — End: 2023-07-03

## 2023-07-03 NOTE — Assessment & Plan Note (Signed)
Doing better today but has bothered him intermittently since early June. The swelling has improved. No redness or heat. Responds to [place] declines referral to ortho for now. Try ice and topical treatments

## 2023-07-03 NOTE — Assessment & Plan Note (Signed)
No recent pain 

## 2023-07-03 NOTE — Patient Instructions (Signed)
Chronic Kidney Disease, Adult Chronic kidney disease is when lasting damage happens to the kidneys slowly over a long time. The kidneys help to: Make pee (urine). Make hormones. Keep the right amount of fluids and chemicals in the body. Most often, this disease does not go away. You must take steps to help keep the kidney damage from getting worse. If steps are not taken, the kidneys might stop working forever. What are the causes? Diabetes. High blood pressure. Diseases that affect the heart and blood vessels. Other kidney diseases. Diseases of the body's disease-fighting system. A problem with the flow of pee. Infections of the organs that make pee, store it, and take it out of the body. Swelling or irritation of your blood vessels. What increases the risk? Getting older. Having someone in your family who has kidney disease or kidney failure. Having a disease caused by genes. Taking medicines often that harm the kidneys. Being near or having contact with harmful substances. Being very overweight. Using tobacco now or in the past. What are the signs or symptoms? Feeling very tired. Having a swollen face, legs, ankles, or feet. Feeling like you may vomit or vomiting. Not feeling hungry. Being confused or not able to focus. Twitches and cramps in the leg muscles or other muscles. Dry, itchy skin. A taste of metal in your mouth. Making less pee, or making more pee. Shortness of breath. Trouble sleeping. You may also become anemic or get weak bones. Anemic means there is not enough red blood cells or hemoglobin in your blood. You may get symptoms slowly. You may not notice them until the kidney damage gets very bad. How is this treated? Often, there is no cure for this disease. Treatment can help with symptoms and help keep the disease from getting worse. You may need to: Avoid alcohol. Avoid foods that are high in salt, potassium, phosphorous, and protein. Take medicines for  symptoms and to help control other conditions. Have dialysis. This treatment gets harmful waste out of your body. Treat other problems that cause your kidney disease or make it worse. Follow these instructions at home: Medicines Take over-the-counter and prescription medicines only as told by your doctor. Do not take any new medicines, vitamins, or supplements unless your doctor says it is okay. Lifestyle  Do not smoke or use any products that contain nicotine or tobacco. If you need help quitting, ask your doctor. If you drink alcohol: Limit how much you use to: 0-1 drink a day for women who are not pregnant. 0-2 drinks a day for men. Know how much alcohol is in your drink. In the [place] one drink equals one 12 oz bottle of beer (355 mL), one 5 oz glass of wine (148 mL), or one 1 oz glass of hard liquor (44 mL). Stay at a healthy weight. If you need help losing weight, ask your doctor. General instructions  Follow instructions from your doctor about what you cannot eat or drink. Track your blood pressure at home. Tell your doctor about any changes. If you have diabetes, track your blood sugar. Exercise at least 30 minutes a day, 5 days a week. Keep your shots (vaccinations) up to date. Keep all follow-up visits. Where to find more information American Association of Kidney Patients: www.aakp.org National Kidney Foundation: www.kidney.org American Kidney Fund: www.akfinc.org Life Options: www.lifeoptions.org Kidney School: www.kidneyschool.org Contact a doctor if: Your symptoms get worse. You get new symptoms. Get help right away if: You get symptoms of end-stage kidney disease. These   include: Headaches. Losing feeling in your hands or feet. Easy bruising. Having hiccups often. Chest pain. Shortness of breath. Lack of menstrual periods, in women. You have a fever. You make less pee than normal. You have pain or you bleed when you pee or poop. These symptoms may be an  emergency. Get help right away. Call your local emergency services (911 in the [place] Do not wait to see if the symptoms will go away. Do not drive yourself to the hospital. Summary Chronic kidney disease is when lasting damage happens to the kidneys slowly over a long time. Causes of this disease include diabetes and high blood pressure. Often, there is no cure for this disease. Treatment can help symptoms and help keep the disease from getting worse. Treatment may involve lifestyle changes, medicines, and dialysis. This information is not intended to replace advice given to you by your health care provider. Make sure you discuss any questions you have with your health care provider. Document Revised: 02/25/2020 Document Reviewed: 02/25/2020 Elsevier Patient Education  2024 Elsevier Inc.  

## 2023-07-04 ENCOUNTER — Other Ambulatory Visit: Payer: Self-pay | Admitting: Family Medicine

## 2023-07-04 ENCOUNTER — Encounter: Payer: Self-pay | Admitting: Family Medicine

## 2023-07-04 DIAGNOSIS — N289 Disorder of kidney and ureter, unspecified: Secondary | ICD-10-CM

## 2023-07-05 ENCOUNTER — Other Ambulatory Visit: Payer: Self-pay

## 2023-07-05 DIAGNOSIS — I10 Essential (primary) hypertension: Secondary | ICD-10-CM

## 2023-07-06 ENCOUNTER — Other Ambulatory Visit: Payer: Self-pay | Admitting: *Deleted

## 2023-07-06 MED ORDER — PIOGLITAZONE HCL 15 MG PO TABS: 15.0000 mg | ORAL_TABLET | Freq: Every day | ORAL | 3 refills | Status: DC

## 2023-07-13 ENCOUNTER — Ambulatory Visit: Payer: Medicare Other

## 2023-07-13 ENCOUNTER — Ambulatory Visit: Payer: Medicare Other | Admitting: *Deleted

## 2023-07-13 VITALS — BP 115/70 | HR 66 | Ht 73.0 in | Wt 196.0 lb

## 2023-07-13 DIAGNOSIS — Z0000 Encounter for general adult medical examination without abnormal findings: Secondary | ICD-10-CM

## 2023-07-13 NOTE — Progress Notes (Signed)
Subjective:   Daniel Gates is a 77 y.o. male who presents for Medicare Annual/Subsequent preventive examination.  Visit Complete: In person   Review of Systems     Cardiac Risk Factors include: advanced age (>59men, >71 women);dyslipidemia;diabetes mellitus;hypertension;male gender     Objective:    Today's Vitals   07/13/23 1101  BP: 115/70  Pulse: 66  Weight: 196 lb (88.9 kg)  Height: 6\' 1"  (1.854 m)   Body mass index is 25.86 kg/m.     07/13/2023   11:09 AM 07/11/2022    1:49 PM 07/07/2021    1:06 PM 07/01/2020   11:10 AM 01/27/2020    9:23 AM 06/30/2019   10:08 AM 06/27/2018   10:21 AM  Advanced Directives  Does Patient Have a Medical Advance Directive? No No No No No No No  Does patient want to make changes to medical advance directive?       Yes (MAU/Ambulatory/Procedural Areas - Information given)  Would patient like information on creating a medical advance directive? No - Patient declined No - Patient declined No - Patient declined No - Patient declined No - Patient declined No - Patient declined     Current Medications (verified) Outpatient Encounter Medications as of 07/13/2023  Medication Sig   allopurinol (ZYLOPRIM) 100 MG tablet TAKE 1 TABLET BY MOUTH DAILY   aspirin 325 MG tablet Take 325 mg by mouth daily.   atorvastatin (LIPITOR) 40 MG tablet Take 1 tablet (40 mg total) by mouth at bedtime.   Blood Glucose Monitoring Suppl (TRUE METRIX AIR GLUCOSE METER) w/Device KIT Use to check blood sugar twice a day.  Dx code: E11.9   COD LIVER OIL PO Take 1 tablet by mouth daily.   COVID-19 mRNA bivalent vaccine, Pfizer, (PFIZER COVID-19 VAC BIVALENT) injection Inject into the muscle.   dicyclomine (BENTYL) 20 MG tablet TAKE 1 TABLET TWICE DAILY   escitalopram (LEXAPRO) 10 MG tablet TAKE 1 TABLET EVERY DAY   fenofibrate (TRICOR) 145 MG tablet Take 1 tablet (145 mg total) by mouth daily.   finasteride (PROSCAR) 5 MG tablet Take 1 tablet (5 mg total) by mouth daily.  Take 5 mg by mouth daily.   Garlic Oil 1000 MG CAPS Take 1 capsule by mouth daily.   glucose blood (TRUE METRIX BLOOD GLUCOSE TEST) test strip Use to check blood sugar twice a day.  Dx code: E11.9   hydrochlorothiazide (MICROZIDE) 12.5 MG capsule Take 1 capsule (12.5 mg total) by mouth daily.   HYDROcodone-acetaminophen (NORCO) 10-325 MG tablet Take 1 tablet by mouth 2 (two) times daily as needed.   latanoprost (XALATAN) 0.005 % ophthalmic solution Place 1 drop into both eyes nightly.   metFORMIN (GLUCOPHAGE) 500 MG tablet TAKE 1 TABLET BY MOUTH TWICE  DAILY WITH MEALS   Multiple Vitamins-Minerals (ONE-A-DAY MENS HEALTH FORMULA) TABS Take 1 tablet by mouth daily.   pioglitazone (ACTOS) 15 MG tablet Take 1 tablet (15 mg total) by mouth daily.   promethazine (PHENERGAN) 25 MG tablet TAKE ONE (1) TABLET(S) EVERY FOUR (4) HOURS AS NEEDED FOR NAUSEA   rOPINIRole (REQUIP) 1 MG tablet TAKE 1 TO 2 TABLETS BY MOUTH AT  BEDTIME   sildenafil (VIAGRA) 100 MG tablet Take 100 mg by mouth as needed.   tamsulosin (FLOMAX) 0.4 MG CAPS capsule TAKE 1 CAPSULE EVERY DAY AFTER SUPPER   TRUEplus Lancets 33G MISC Use to check blood sugar twice a day.  Dx code: E11.9   No facility-administered encounter medications on file  as of 07/13/2023.    Allergies (verified) Patient has no known allergies.   History: Past Medical History:  Diagnosis Date   Allergic rhinitis    Anxiety 2003   "after cancer surgery"   Asthma    childhood per pt.   Bradycardia 10/01/2016   Cancer (HCC)    colon; diagnosed 2003   Cataract 12/29/2016   not sure which eye   Chronic renal insufficiency 06/07/2017   Depression    Diabetes type 2, controlled (HCC) 06/27/2012   Glaucoma    Gout    Hyperlipidemia    Hypertension    Medicare annual wellness visit, subsequent 02/15/2014   Open-angle glaucoma 12/29/2016   Sickle cell anemia (HCC) 06/12/2017   "they say I have a trace"   TMJ arthralgia 01/02/2016   Past Surgical History:   Procedure Laterality Date   COLON SURGERY  2003   for colon cancer   COLONOSCOPY     INGUINAL HERNIA REPAIR  1997   right   Family History  Problem Relation Age of Onset   Diabetes Mother    Heart disease Mother        MI   Cancer Father        prostate   Prostate cancer Father    Dementia Sister    Thyroid disease Sister    Diabetes Sister    Heart disease Brother        CAD s/p CABG   Prostate cancer Brother    Colon cancer Neg Hx    Stomach cancer Neg Hx    Esophageal cancer Neg Hx    Rectal cancer Neg Hx    Social History   Socioeconomic History   Marital status: Widowed    Spouse name: Not on file   Number of children: 7   Years of education: Not on file   Highest education level: Not on file  Occupational History    Employer: DISABLED  Tobacco Use   Smoking status: Never   Smokeless tobacco: Never  Vaping Use   Vaping status: Never Used  Substance and Sexual Activity   Alcohol use: No   Drug use: No   Sexual activity: Yes  Other Topics Concern   Not on file  Social History Narrative   Not on file   Social Determinants of Health   Financial Resource Strain: Low Risk  (07/13/2023)   Overall Financial Resource Strain (CARDIA)    Difficulty of Paying Living Expenses: Not hard at all  Food Insecurity: No Food Insecurity (07/13/2023)   Hunger Vital Sign    Worried About Running Out of Food in the Last Year: Never true    Ran Out of Food in the Last Year: Never true  Transportation Needs: No Transportation Needs (07/13/2023)   PRAPARE - Administrator, Civil Service (Medical): No    Lack of Transportation (Non-Medical): No  Physical Activity: Inactive (07/13/2023)   Exercise Vital Sign    Days of Exercise per Week: 0 days    Minutes of Exercise per Session: 0 min  Stress: No Stress Concern Present (07/13/2023)   [employer] of Occupational Health - Occupational Stress Questionnaire    Feeling of Stress : Not at all  Social Connections:  Socially Isolated (07/13/2023)   Social Connection and Isolation Panel [NHANES]    Frequency of Communication with Friends and Family: Once a week    Frequency of Social Gatherings with Friends and Family: Never    Attends Religious Services:  More than 4 times per year    Active Member of Clubs or Organizations: No    Attends Banker Meetings: Never    Marital Status: Widowed    Tobacco Counseling Counseling given: Not Answered   Clinical Intake:  Pre-visit preparation completed: Yes  Pain : No/denies pain  BMI - recorded: 25.86 Nutritional Risks: None Diabetes: Yes CBG done?: No Did pt. bring in CBG monitor from home?: No  How often do you need to have someone help you when you read instructions, pamphlets, or other written materials from your doctor or pharmacy?: 1 - Never  Interpreter Needed?: No  Information entered by :: Donne Anon, CMA   Activities of Daily Living    07/13/2023   11:03 AM  In your present state of health, do you have any difficulty performing the following activities:  Hearing? 1  Comment total hearing loss in right ear  Vision? 1  Difficulty concentrating or making decisions? 0  Walking or climbing stairs? 1  Comment right knee pain  Dressing or bathing? 0  Doing errands, shopping? 0  Preparing Food and eating ? N  Using the Toilet? N  In the past six months, have you accidently leaked urine? Y  Do you have problems with loss of bowel control? N  Managing your Medications? N  Managing your Finances? N  Housekeeping or managing your Housekeeping? N    Patient Care Team: Bradd Canary, MD as PCP - General (Family Medicine) Francee Piccolo, MD as Consulting Physician (Ophthalmology)  Indicate any recent Medical Services you may have received from other than [facility] providers in the past year (date may be approximate).     Assessment:   This is a routine wellness examination for Mcdaniel.  Hearing/Vision screen No results  found.  Dietary issues and exercise activities discussed:     Goals Addressed   None    Depression Screen    07/13/2023   11:09 AM 07/03/2023    1:58 PM 11/07/2022   10:40 AM 08/24/2022   11:06 AM 07/11/2022    1:50 PM 05/04/2022   10:27 AM 07/07/2021    1:09 PM  PHQ 2/9 Scores  PHQ - 2 Score 0 0 0 0 0 0 1  PHQ- 9 Score    0       Fall Risk    07/13/2023   11:06 AM 07/03/2023    1:58 PM 11/07/2022   10:40 AM 08/24/2022   11:06 AM 07/11/2022    1:49 PM  Fall Risk   Falls in the past year? 0 0 0 1 1  Number falls in past yr: 0 0 0 0 1  Injury with Fall? 0 0 0 0 0  Risk for fall due to : No Fall Risks    History of fall(s)  Follow up Falls evaluation completed Falls evaluation completed Falls evaluation completed Falls evaluation completed Falls evaluation completed    MEDICARE RISK AT HOME:  Medicare Risk at Home - 07/13/23 1106     Any stairs in or around the home? Yes    If so, are there any without handrails? Yes    Home free of loose throw rugs in walkways, pet beds, electrical cords, etc? Yes    Adequate lighting in your home to reduce risk of falls? Yes    Life alert? No    Use of a cane, walker or w/c? No    Grab bars in the bathroom? No  Shower chair or bench in shower? No    Elevated toilet seat or a handicapped toilet? No             TIMED UP AND GO:  Was the test performed?  Yes  Length of time to ambulate 10 feet: 8 sec Gait slow and steady with assistive device    Cognitive Function:    06/27/2018   10:23 AM 06/26/2017   10:20 AM 06/23/2016   10:07 AM  MMSE - Mini Mental State Exam  Orientation to time 5 5 5   Orientation to Place 5 5 5   Registration 3 3 3   Attention/ Calculation 5 5 5   Recall 2 2 2   Language- name 2 objects 2 2 2   Language- repeat 1 1 1   Language- follow 3 step command 3 3 3   Language- read & follow direction 1 1 1   Write a sentence 1 1 1   Copy design 1 1 1   Total score 29 29 29         07/13/2023   11:11 AM 07/11/2022     1:56 PM 07/07/2021    1:18 PM 07/01/2020   11:15 AM  6CIT Screen  What Year? 0 points 0 points 0 points 0 points  What month? 0 points 0 points 0 points 0 points  What time? 0 points 0 points 0 points 0 points  Count back from 20 0 points 0 points 0 points 0 points  Months in reverse 4 points 4 points 2 points 2 points  Repeat phrase 8 points 2 points 0 points 0 points  Total Score 12 points 6 points 2 points 2 points    Immunizations Immunization History  Administered Date(s) Administered   PFIZER(Purple Top)SARS-COV-2 Vaccination 02/01/2020, 03/10/2020, 12/28/2020   Pfizer Covid-19 Vaccine Bivalent Booster 92yrs & up 10/24/2021   Td 12/04/1997, 07/14/2019   Tdap 01/30/2012    TDAP status: Up to date  Flu Vaccine status: Due, Education has been provided regarding the importance of this vaccine. Advised may receive this vaccine at local pharmacy or Health Dept. Aware to provide a copy of the vaccination record if obtained from local pharmacy or Health Dept. Verbalized acceptance and understanding.  Pneumococcal vaccine status: Due, Education has been provided regarding the importance of this vaccine. Advised may receive this vaccine at local pharmacy or Health Dept. Aware to provide a copy of the vaccination record if obtained from local pharmacy or Health Dept. Verbalized acceptance and understanding.  Covid-19 vaccine status: Information provided on how to obtain vaccines.   Qualifies for Shingles Vaccine? Yes   Zostavax completed No   Shingrix Completed?: No.    Education has been provided regarding the importance of this vaccine. Patient has been advised to call insurance company to determine out of pocket expense if they have not yet received this vaccine. Advised may also receive vaccine at local pharmacy or Health Dept. Verbalized acceptance and understanding.  Screening Tests Health Maintenance  Topic Date Due   Zoster Vaccines- Shingrix (1 of 2) Never done   Pneumonia  Vaccine 44+ Years old (1 of 1 - PCV) Never done   FOOT EXAM  05/18/2017   COVID-19 Vaccine (5 - 2023-24 season) 08/04/2022   OPHTHALMOLOGY EXAM  06/01/2023   Medicare Annual Wellness (AWV)  07/12/2023   INFLUENZA VACCINE  07/05/2023   HEMOGLOBIN A1C  01/03/2024   Diabetic kidney evaluation - eGFR measurement  07/02/2024   Diabetic kidney evaluation - Urine ACR  07/02/2024   Colonoscopy  08/18/2027  DTaP/Tdap/Td (4 - Td or Tdap) 07/13/2029   Hepatitis C Screening  Completed   HPV VACCINES  Aged Out    Health Maintenance  Health Maintenance Due  Topic Date Due   Zoster Vaccines- Shingrix (1 of 2) Never done   Pneumonia Vaccine 48+ Years old (1 of 1 - PCV) Never done   FOOT EXAM  05/18/2017   COVID-19 Vaccine (5 - 2023-24 season) 08/04/2022   OPHTHALMOLOGY EXAM  06/01/2023   Medicare Annual Wellness (AWV)  07/12/2023   INFLUENZA VACCINE  07/05/2023    Colorectal cancer screening: No longer required.   Lung Cancer Screening: (Low Dose CT Chest recommended if Age 29-80 years, 20 pack-year currently smoking OR have quit w/in 15years.) does not qualify.   Additional Screening:  Hepatitis C Screening: does qualify; Completed 12/24/15  Vision Screening: Recommended annual ophthalmology exams for early detection of glaucoma and other disorders of the eye. Is the patient up to date with their annual eye exam?  Yes  Who is the provider or what is the name of the office in which the patient attends annual eye exams? [facility] If pt is not established with a provider, would they like to be referred to a provider to establish care? No .   Dental Screening: Recommended annual dental exams for proper oral hygiene  Diabetic Foot Exam: Diabetic Foot Exam: Overdue, Pt has been advised about the importance in completing this exam. Pt is scheduled for diabetic foot exam on N/a.  Community Resource Referral / Chronic Care Management: CRR required this visit?  No   CCM  required this visit?  No     Plan:     I have personally reviewed and noted the following in the patient's chart:   Medical and social history Use of alcohol, tobacco or illicit drugs  Current medications and supplements including opioid prescriptions. Patient is currently taking opioid prescriptions. Information provided to patient regarding non-opioid alternatives. Patient advised to discuss non-opioid treatment plan with their provider. Functional ability and status Nutritional status Physical activity Advanced directives List of other physicians Hospitalizations, surgeries, and ER visits in previous 12 months Vitals Screenings to include cognitive, depression, and falls Referrals and appointments  In addition, I have reviewed and discussed with patient certain preventive protocols, quality metrics, and best practice recommendations. A written personalized care plan for preventive services as well as general preventive health recommendations were provided to patient.     Donne Anon, CMA   07/13/2023   After Visit Summary: printed  Nurse Notes: None

## 2023-07-13 NOTE — Progress Notes (Signed)
PCP requested nurse visit for BP/pulse check.  Vitals obtained during AWV. 115/70 pulse 66

## 2023-07-13 NOTE — Patient Instructions (Signed)
[name] , Thank you for taking time to come for your Medicare Wellness Visit. I appreciate your ongoing commitment to your health goals. Please review the following plan we discussed and let me know if I can assist you in the future.     This is a list of the screening recommended for you and due dates:  Health Maintenance  Topic Date Due   Zoster (Shingles) Vaccine (1 of 2) Never done   Pneumonia Vaccine (1 of 1 - PCV) Never done   Complete foot exam   05/18/2017   COVID-19 Vaccine (5 - 2023-24 season) 08/04/2022   Eye exam for diabetics  06/01/2023   Flu Shot  07/05/2023   Hemoglobin A1C  01/03/2024   Yearly kidney function blood test for diabetes  07/02/2024   Yearly kidney health urinalysis for diabetes  07/02/2024   Medicare Annual Wellness Visit  07/12/2024   Colon Cancer Screening  08/18/2027   DTaP/Tdap/Td vaccine (4 - Td or Tdap) 07/13/2029   Hepatitis C Screening  Completed   HPV Vaccine  Aged Out    Next appointment: Follow up in one year for your annual wellness visit.   Preventive Care 1 Years and Older, Male Preventive care refers to lifestyle choices and visits with your health care provider that can promote health and wellness. What does preventive care include? A yearly physical exam. This is also called an annual well check. Dental exams once or twice a year. Routine eye exams. Ask your health care provider how often you should have your eyes checked. Personal lifestyle choices, including: Daily care of your teeth and gums. Regular physical activity. Eating a healthy diet. Avoiding tobacco and drug use. Limiting alcohol use. Practicing safe sex. Taking low doses of aspirin every day. Taking vitamin and mineral supplements as recommended by your health care provider. What happens during an annual well check? The services and screenings done by your health care provider during your annual well check will depend on your age, overall health, lifestyle  risk factors, and family history of disease. Counseling  Your health care provider may ask you questions about your: Alcohol use. Tobacco use. Drug use. Emotional well-being. Home and relationship well-being. Sexual activity. Eating habits. History of falls. Memory and ability to understand (cognition). Work and work Astronomer. Screening  You may have the following tests or measurements: Height, weight, and BMI. Blood pressure. Lipid and cholesterol levels. These may be checked every 5 years, or more frequently if you are over 44 years old. Skin check. Lung cancer screening. You may have this screening every year starting at age 20 if you have a 30-pack-year history of smoking and currently smoke or have quit within the past 15 years. Fecal occult blood test (FOBT) of the stool. You may have this test every year starting at age 54. Flexible sigmoidoscopy or colonoscopy. You may have a sigmoidoscopy every 5 years or a colonoscopy every 10 years starting at age 69. Prostate cancer screening. Recommendations will vary depending on your family history and other risks. Hepatitis C blood test. Hepatitis B blood test. Sexually transmitted disease (STD) testing. Diabetes screening. This is done by checking your blood sugar (glucose) after you have not eaten for a while (fasting). You may have this done every 1-3 years. Abdominal aortic aneurysm (AAA) screening. You may need this if you are a current or former smoker. Osteoporosis. You may be screened starting at age 73 if you are at high risk. Talk with your health care  provider about your test results, treatment options, and if necessary, the need for more tests. Vaccines  Your health care provider may recommend certain vaccines, such as: Influenza vaccine. This is recommended every year. Tetanus, diphtheria, and acellular pertussis (Tdap, Td) vaccine. You may need a Td booster every 10 years. Zoster vaccine. You may need this after age  57. Pneumococcal 13-valent conjugate (PCV13) vaccine. One dose is recommended after age 54. Pneumococcal polysaccharide (PPSV23) vaccine. One dose is recommended after age 30. Talk to your health care provider about which screenings and vaccines you need and how often you need them. This information is not intended to replace advice given to you by your health care provider. Make sure you discuss any questions you have with your health care provider. Document Released: 12/17/2015 Document Revised: 08/09/2016 Document Reviewed: 09/21/2015 Elsevier Interactive Patient Education  2017 [employer].  Fall Prevention in the Home Falls can cause injuries. They can happen to people of all ages. There are many things you can do to make your home safe and to help prevent falls. What can I do on the outside of my home? Regularly fix the edges of walkways and driveways and fix any cracks. Remove anything that might make you trip as you walk through a door, such as a raised step or threshold. Trim any bushes or trees on the path to your home. Use bright outdoor lighting. Clear any walking paths of anything that might make someone trip, such as rocks or tools. Regularly check to see if handrails are loose or broken. Make sure that both sides of any steps have handrails. Any raised decks and porches should have guardrails on the edges. Have any leaves, snow, or ice cleared regularly. Use sand or salt on walking paths during winter. Clean up any spills in your garage right away. This includes oil or grease spills. What can I do in the bathroom? Use night lights. Install grab bars by the toilet and in the tub and shower. Do not use towel bars as grab bars. Use non-skid mats or decals in the tub or shower. If you need to sit down in the shower, use a plastic, non-slip stool. Keep the floor dry. Clean up any water that spills on the floor as soon as it happens. Remove soap buildup in the tub or shower  regularly. Attach bath mats securely with double-sided non-slip rug tape. Do not have throw rugs and other things on the floor that can make you trip. What can I do in the bedroom? Use night lights. Make sure that you have a light by your bed that is easy to reach. Do not use any sheets or blankets that are too big for your bed. They should not hang down onto the floor. Have a firm chair that has side arms. You can use this for support while you get dressed. Do not have throw rugs and other things on the floor that can make you trip. What can I do in the kitchen? Clean up any spills right away. Avoid walking on wet floors. Keep items that you use a lot in easy-to-reach places. If you need to reach something above you, use a strong step stool that has a grab bar. Keep electrical cords out of the way. Do not use floor polish or wax that makes floors slippery. If you must use wax, use non-skid floor wax. Do not have throw rugs and other things on the floor that can make you trip. What can I  do with my stairs? Do not leave any items on the stairs. Make sure that there are handrails on both sides of the stairs and use them. Fix handrails that are broken or loose. Make sure that handrails are as long as the stairways. Check any carpeting to make sure that it is firmly attached to the stairs. Fix any carpet that is loose or worn. Avoid having throw rugs at the top or bottom of the stairs. If you do have throw rugs, attach them to the floor with carpet tape. Make sure that you have a light switch at the top of the stairs and the bottom of the stairs. If you do not have them, ask someone to add them for you. What else can I do to help prevent falls? Wear shoes that: Do not have high heels. Have rubber bottoms. Are comfortable and fit you well. Are closed at the toe. Do not wear sandals. If you use a stepladder: Make sure that it is fully opened. Do not climb a closed stepladder. Make sure that  both sides of the stepladder are locked into place. Ask someone to hold it for you, if possible. Clearly mark and make sure that you can see: Any grab bars or handrails. First and last steps. Where the edge of each step is. Use tools that help you move around (mobility aids) if they are needed. These include: Canes. Walkers. Scooters. Crutches. Turn on the lights when you go into a dark area. Replace any light bulbs as soon as they burn out. Set up your furniture so you have a clear path. Avoid moving your furniture around. If any of your floors are uneven, fix them. If there are any pets around you, be aware of where they are. Review your medicines with your doctor. Some medicines can make you feel dizzy. This can increase your chance of falling. Ask your doctor what other things that you can do to help prevent falls. This information is not intended to replace advice given to you by your health care provider. Make sure you discuss any questions you have with your health care provider. Document Released: 09/16/2009 Document Revised: 04/27/2016 Document Reviewed: 12/25/2014 Elsevier Interactive Patient Education  2017 [employer].

## 2023-07-27 ENCOUNTER — Other Ambulatory Visit (INDEPENDENT_AMBULATORY_CARE_PROVIDER_SITE_OTHER): Payer: Medicare Other

## 2023-07-27 DIAGNOSIS — I10 Essential (primary) hypertension: Secondary | ICD-10-CM | POA: Diagnosis not present

## 2023-07-27 LAB — COMPREHENSIVE METABOLIC PANEL
ALT: 19 U/L (ref 0–53)
AST: 25 U/L (ref 0–37)
Albumin: 3.7 g/dL (ref 3.5–5.2)
Alkaline Phosphatase: 56 U/L (ref 39–117)
BUN: 21 mg/dL (ref 6–23)
CO2: 24 mEq/L (ref 19–32)
Calcium: 9.6 mg/dL (ref 8.4–10.5)
Chloride: 105 mEq/L (ref 96–112)
Creatinine, Ser: 1.6 mg/dL — ABNORMAL HIGH (ref 0.40–1.50)
GFR: 41 mL/min — ABNORMAL LOW (ref >60.00)
Glucose, Bld: 110 mg/dL — ABNORMAL HIGH (ref 70–99)
Potassium: 4.4 mEq/L (ref 3.5–5.1)
Sodium: 138 mEq/L (ref 135–145)
Total Bilirubin: 0.5 mg/dL (ref 0.2–1.2)
Total Protein: 7.3 g/dL (ref 6.0–8.3)

## 2023-08-12 ENCOUNTER — Other Ambulatory Visit: Payer: Self-pay | Admitting: Family Medicine

## 2023-08-29 ENCOUNTER — Other Ambulatory Visit: Payer: Self-pay | Admitting: Family Medicine

## 2023-08-29 DIAGNOSIS — H401131 Primary open-angle glaucoma, bilateral, mild stage: Secondary | ICD-10-CM | POA: Diagnosis not present

## 2023-08-29 DIAGNOSIS — H35361 Drusen (degenerative) of macula, right eye: Secondary | ICD-10-CM | POA: Diagnosis not present

## 2023-09-12 ENCOUNTER — Other Ambulatory Visit: Payer: Self-pay | Admitting: Family Medicine

## 2023-10-22 ENCOUNTER — Other Ambulatory Visit: Payer: Self-pay | Admitting: *Deleted

## 2023-10-22 MED ORDER — ESCITALOPRAM OXALATE 10 MG PO TABS: 10.0000 mg | ORAL_TABLET | Freq: Every day | ORAL | 0 refills | Status: DC

## 2023-11-08 ENCOUNTER — Ambulatory Visit (INDEPENDENT_AMBULATORY_CARE_PROVIDER_SITE_OTHER): Payer: Medicare Other | Admitting: Family Medicine

## 2023-11-08 ENCOUNTER — Encounter: Payer: Self-pay | Admitting: Family Medicine

## 2023-11-08 ENCOUNTER — Ambulatory Visit: Payer: Medicare Other | Admitting: Family Medicine

## 2023-11-08 ENCOUNTER — Ambulatory Visit (HOSPITAL_BASED_OUTPATIENT_CLINIC_OR_DEPARTMENT_OTHER)
Admission: RE | Admit: 2023-11-08 | Discharge: 2023-11-08 | Disposition: A | Discharge status: Home / Self Care | Payer: Medicare Other | Source: Ambulatory Visit | Attending: Family Medicine | Admitting: Family Medicine

## 2023-11-08 VITALS — BP 127/77 | HR 64 | Ht 73.0 in | Wt 199.0 lb

## 2023-11-08 DIAGNOSIS — E7849 Other hyperlipidemia: Secondary | ICD-10-CM

## 2023-11-08 DIAGNOSIS — E038 Other specified hypothyroidism: Secondary | ICD-10-CM | POA: Diagnosis not present

## 2023-11-08 DIAGNOSIS — M1A0710 Idiopathic chronic gout, right ankle and foot, without tophus (tophi): Secondary | ICD-10-CM | POA: Diagnosis not present

## 2023-11-08 DIAGNOSIS — I10 Essential (primary) hypertension: Secondary | ICD-10-CM

## 2023-11-08 DIAGNOSIS — I739 Peripheral vascular disease, unspecified: Secondary | ICD-10-CM

## 2023-11-08 DIAGNOSIS — N189 Chronic kidney disease, unspecified: Secondary | ICD-10-CM

## 2023-11-08 DIAGNOSIS — E119 Type 2 diabetes mellitus without complications: Secondary | ICD-10-CM | POA: Diagnosis not present

## 2023-11-08 DIAGNOSIS — M25461 Effusion, right knee: Secondary | ICD-10-CM | POA: Diagnosis not present

## 2023-11-08 DIAGNOSIS — Z7984 Long term (current) use of oral hypoglycemic drugs: Secondary | ICD-10-CM

## 2023-11-08 DIAGNOSIS — M25561 Pain in right knee: Secondary | ICD-10-CM

## 2023-11-08 DIAGNOSIS — G8929 Other chronic pain: Secondary | ICD-10-CM | POA: Insufficient documentation

## 2023-11-08 DIAGNOSIS — R011 Cardiac murmur, unspecified: Secondary | ICD-10-CM

## 2023-11-08 DIAGNOSIS — E559 Vitamin D deficiency, unspecified: Secondary | ICD-10-CM

## 2023-11-08 LAB — LIPID PANEL
Cholesterol: 124 mg/dL (ref 0–200)
HDL: 41.4 mg/dL (ref >39.00)
LDL Cholesterol: 52 mg/dL (ref 0–99)
NonHDL: 83.01
Total CHOL/HDL Ratio: 3
Triglycerides: 156 mg/dL — ABNORMAL HIGH (ref 0.0–149.0)
VLDL: 31.2 mg/dL (ref 0.0–40.0)

## 2023-11-08 LAB — COMPREHENSIVE METABOLIC PANEL
ALT: 18 U/L (ref 0–53)
AST: 19 U/L (ref 0–37)
Albumin: 4.1 g/dL (ref 3.5–5.2)
Alkaline Phosphatase: 76 U/L (ref 39–117)
BUN: 27 mg/dL — ABNORMAL HIGH (ref 6–23)
CO2: 28 meq/L (ref 19–32)
Calcium: 10.3 mg/dL (ref 8.4–10.5)
Chloride: 104 meq/L (ref 96–112)
Creatinine, Ser: 1.77 mg/dL — ABNORMAL HIGH (ref 0.40–1.50)
GFR: 36.25 mL/min — ABNORMAL LOW (ref >60.00)
Glucose, Bld: 86 mg/dL (ref 70–99)
Potassium: 4.2 meq/L (ref 3.5–5.1)
Sodium: 140 meq/L (ref 135–145)
Total Bilirubin: 0.3 mg/dL (ref 0.2–1.2)
Total Protein: 7.7 g/dL (ref 6.0–8.3)

## 2023-11-08 LAB — CBC WITH DIFFERENTIAL/PLATELET
Basophils Absolute: 0 10*3/uL (ref 0.0–0.1)
Basophils Relative: 0.3 % (ref 0.0–3.0)
Eosinophils Absolute: 0.2 10*3/uL (ref 0.0–0.7)
Eosinophils Relative: 2.7 % (ref 0.0–5.0)
HCT: 44.4 % (ref 39.0–52.0)
Hemoglobin: 14.7 g/dL (ref 13.0–17.0)
Lymphocytes Relative: 38 % (ref 12.0–46.0)
Lymphs Abs: 2.4 10*3/uL (ref 0.7–4.0)
MCHC: 33.2 g/dL (ref 30.0–36.0)
MCV: 80 fL (ref 78.0–100.0)
Monocytes Absolute: 0.6 10*3/uL (ref 0.1–1.0)
Monocytes Relative: 9.3 % (ref 3.0–12.0)
Neutro Abs: 3.2 10*3/uL (ref 1.4–7.7)
Neutrophils Relative %: 49.7 % (ref 43.0–77.0)
Platelets: 214 10*3/uL (ref 150.0–400.0)
RBC: 5.54 Mil/uL (ref 4.22–5.81)
RDW: 17.1 % — ABNORMAL HIGH (ref 11.5–15.5)
WBC: 6.4 10*3/uL (ref 4.0–10.5)

## 2023-11-08 LAB — TSH: TSH: 4.35 u[IU]/mL (ref 0.35–5.50)

## 2023-11-08 LAB — HEMOGLOBIN A1C: Hgb A1c MFr Bld: 6.3 % (ref 4.6–6.5)

## 2023-11-08 LAB — VITAMIN D 25 HYDROXY (VIT D DEFICIENCY, FRACTURES): VITD: 60 ng/mL (ref 30.00–100.00)

## 2023-11-08 MED ORDER — DICLOFENAC SODIUM 1 % EX GEL: 2.0000 g | Freq: Four times a day (QID) | CUTANEOUS | 2 refills | Status: AC

## 2023-11-08 NOTE — Assessment & Plan Note (Signed)
Good glycemic control on Metformin 500mg  once daily and Pioglitazone once daily. -Continue Metformin 500mg  once daily and Pioglitazone once daily. Labs today. Lifestyle measures encouraged.  Lab Results  Component Value Date   HGBA1C 6.3 07/03/2023

## 2023-11-08 NOTE — Assessment & Plan Note (Signed)
Abnormal findings on home assessment Lats ABIs normal 3 years ago, repeat today

## 2023-11-08 NOTE — Assessment & Plan Note (Signed)
Blood pressure is at goal for age and co-morbidities.   Recommendations: continue HCTZ 12.5 mg daily - BP goal <130/80 - monitor and log blood pressures at home - check around the same time each day in a relaxed setting - Limit salt to <2000 mg/day - Follow DASH eating plan (heart healthy diet) - limit alcohol to 2 standard drinks per day for men and 1 per day for women - avoid tobacco products - get at least 2 hours of regular aerobic exercise weekly Patient aware of signs/symptoms requiring further/urgent evaluation. Labs updated today.

## 2023-11-08 NOTE — Assessment & Plan Note (Signed)
Hydrate and monitor 

## 2023-11-08 NOTE — Assessment & Plan Note (Signed)
No meds currently. Labs today

## 2023-11-08 NOTE — Assessment & Plan Note (Signed)
On Vitamin D supplementation, dose unclear. -Check Vitamin D levels.

## 2023-11-08 NOTE — Progress Notes (Signed)
Established Patient Office Visit  Subjective   Patient ID: Daniel Gates, male    DOB: June 21, 1946  Age: 77 y.o. MRN: [id number]  Chief Complaint  Patient presents with   Medical Management of Chronic Issues   Diabetes    HPI   Discussed the use of AI scribe software for clinical note transcription with the patient, who gave verbal consent to proceed.  History of Present Illness   The patient, with a history of hypertension, hyperlipidemia, gout, and diabetes, presents for a routine follow-up. They report a significant issue with their right knee, which has been causing discomfort for approximately a year. The pain is generalized, with occasional swelling and feelings of instability. The knee pain is particularly severe in the morning, causing stiffness and difficulty getting out of bed.  In terms of their chronic conditions, the patient has been managing well with their current medication regimen, which includes hydrochlorothiazide, Lipitor, fenofibrate, allopurinol, metformin, and Actos. They have not experienced any significant gout flares recently, although they did report a brief episode of hand pain which has since resolved. Their blood pressure readings at home have been satisfactory, and they deny any symptoms of chest pain, shortness of breath, or swelling in the feet or abdomen.  The patient also has a history of low vitamin D levels, for which they are taking a supplement, although the exact dosage is unclear. Their blood glucose levels have been well-controlled, with readings consistently below 100. They report no significant exercise but maintain a healthy diet.  Lastly, the patient denies any unusual fatigue or weakness. However, a heart murmur was noted during the physical examination, although the patient has no known history of this.           ROS All review of systems negative except what is listed in the HPI    Objective:     BP 127/77   Pulse 64   Ht 6'  1" (1.854 m)   Wt 199 lb (90.3 kg)   SpO2 99%   BMI 26.25 kg/m    Physical Exam Vitals reviewed.  Constitutional:      Appearance: Normal appearance.  Cardiovascular:     Rate and Rhythm: Normal rate and regular rhythm.     Heart sounds: Murmur heard.  Pulmonary:     Effort: Pulmonary effort is normal.     Breath sounds: Normal breath sounds.  Skin:    General: Skin is warm and dry.  Neurological:     Mental Status: He is alert and oriented to person, place, and time.  Psychiatric:        Mood and Affect: Mood normal.        Behavior: Behavior normal.        Thought Content: Thought content normal.        Judgment: Judgment normal.      No results found for any visits on 11/08/23.    The ASCVD Risk score (Arnett DK, et al., 2019) failed to calculate for the following reasons:   The valid total cholesterol range is 130 to 320 mg/dL    Assessment & Plan:   Problem List Items Addressed This Visit       Active Problems   Hypothyroidism - Primary    No meds currently. Labs today       Vitamin D deficiency    On Vitamin D supplementation, dose unclear. -Check Vitamin D levels.       Relevant Orders   VITAMIN D 25  Hydroxy (Vit-D Deficiency, Fractures)   Hyperlipidemia    -Continue Lipitor 40mg  and Fenofibrate. Lifestyle factors for lowering cholesterol include: Diet therapy - heart-healthy diet rich in fruits, veggies, fiber-rich whole grains, lean meats, chicken, fish (at least twice a week), fat-free or 1% dairy products; foods low in saturated/trans fats, cholesterol, sodium, and sugar. Mediterranean diet has shown to be very heart healthy. Regular exercise - recommend at least 30 minutes a day, 5 times per week Weight management        Relevant Orders   Comprehensive metabolic panel   Lipid panel   Essential hypertension    Blood pressure is at goal for age and co-morbidities.   Recommendations: continue HCTZ 12.5 mg daily - BP goal <130/80 -  monitor and log blood pressures at home - check around the same time each day in a relaxed setting - Limit salt to <2000 mg/day - Follow DASH eating plan (heart healthy diet) - limit alcohol to 2 standard drinks per day for men and 1 per day for women - avoid tobacco products - get at least 2 hours of regular aerobic exercise weekly Patient aware of signs/symptoms requiring further/urgent evaluation. Labs updated today.       Relevant Orders   CBC with Differential/Platelet   Comprehensive metabolic panel   TSH   Gout    -Continue Allopurinol.      Non-insulin treated type 2 diabetes mellitus (HCC)    Good glycemic control on Metformin 500mg  once daily and Pioglitazone once daily. -Continue Metformin 500mg  once daily and Pioglitazone once daily. Labs today. Lifestyle measures encouraged.  Lab Results  Component Value Date   HGBA1C 6.3 07/03/2023         Relevant Orders   Hemoglobin A1c   Lipid panel   Chronic renal insufficiency    Hydrate and monitor      Relevant Orders   Comprehensive metabolic panel   Peripheral vascular disease (HCC)    Abnormal findings on home assessment Lats ABIs normal 3 years ago, repeat today      Relevant Orders   VAS [place] ABI WITH/WO TBI   Right knee pain    Chronic right knee pain with swelling and instability. No prior imaging or specialist consultation. -Order knee X-ray today to assess for arthritis or other structural abnormalities. -Try Voltaren gel for pain management      Relevant Medications   diclofenac Sodium (VOLTAREN) 1 % GEL   Other Relevant Orders   DG Knee 1-2 Views Right   Other Visit Diagnoses     Murmur     New possible murmur detected on auscultation, no associated symptoms. -Echocardiogram to evaluate    Relevant Orders   ECHOCARDIOGRAM COMPLETE       Return in about 6 months (around 05/08/2024) for routine follow-up.    Clayborne Dana, NP

## 2023-11-08 NOTE — Assessment & Plan Note (Signed)
-  Continue Lipitor 40mg  and Fenofibrate. Lifestyle factors for lowering cholesterol include: Diet therapy - heart-healthy diet rich in fruits, veggies, fiber-rich whole grains, lean meats, chicken, fish (at least twice a week), fat-free or 1% dairy products; foods low in saturated/trans fats, cholesterol, sodium, and sugar. Mediterranean diet has shown to be very heart healthy. Regular exercise - recommend at least 30 minutes a day, 5 times per week Weight management

## 2023-11-08 NOTE — Assessment & Plan Note (Signed)
Continue Allopurinol

## 2023-11-08 NOTE — Assessment & Plan Note (Signed)
Chronic right knee pain with swelling and instability. No prior imaging or specialist consultation. -Order knee X-ray today to assess for arthritis or other structural abnormalities. -Try Voltaren gel for pain management

## 2023-11-13 DIAGNOSIS — N1832 Chronic kidney disease, stage 3b: Secondary | ICD-10-CM | POA: Diagnosis not present

## 2023-11-13 DIAGNOSIS — I129 Hypertensive chronic kidney disease with stage 1 through stage 4 chronic kidney disease, or unspecified chronic kidney disease: Secondary | ICD-10-CM | POA: Diagnosis not present

## 2023-11-13 DIAGNOSIS — E1122 Type 2 diabetes mellitus with diabetic chronic kidney disease: Secondary | ICD-10-CM | POA: Diagnosis not present

## 2023-11-13 DIAGNOSIS — N138 Other obstructive and reflux uropathy: Secondary | ICD-10-CM | POA: Diagnosis not present

## 2023-11-16 DIAGNOSIS — N189 Chronic kidney disease, unspecified: Secondary | ICD-10-CM | POA: Diagnosis not present

## 2023-11-16 DIAGNOSIS — N1832 Chronic kidney disease, stage 3b: Secondary | ICD-10-CM | POA: Diagnosis not present

## 2023-11-26 ENCOUNTER — Ambulatory Visit (HOSPITAL_BASED_OUTPATIENT_CLINIC_OR_DEPARTMENT_OTHER)
Admission: RE | Admit: 2023-11-26 | Discharge: 2023-11-26 | Disposition: A | Discharge status: Home / Self Care | Payer: Medicare Other | Source: Ambulatory Visit | Attending: Family Medicine | Admitting: Family Medicine

## 2023-11-26 DIAGNOSIS — I739 Peripheral vascular disease, unspecified: Secondary | ICD-10-CM | POA: Diagnosis not present

## 2023-11-26 DIAGNOSIS — R011 Cardiac murmur, unspecified: Secondary | ICD-10-CM | POA: Diagnosis not present

## 2023-11-26 LAB — ECHOCARDIOGRAM COMPLETE
AR max vel: 1.42 cm2
AV Area VTI: 1.82 cm2
AV Area mean vel: 1.65 cm2
AV Mean grad: 7 mm[Hg]
AV Peak grad: 14.3 mm[Hg]
AV Vena cont: 0.6 cm
Ao pk vel: 1.89 m/s
Area-P 1/2: 2.46 cm2
Calc EF: 60.6 %
MV M vel: 1.69 m/s
MV Peak grad: 11.4 mm[Hg]
P 1/2 time: 832 ms
S' Lateral: 2.8 cm
Single Plane A2C EF: 63 %
Single Plane A4C EF: 60.9 %

## 2023-12-02 LAB — VAS US ABI WITH/WO TBI
Left ABI: 0.16
Right ABI: 1.14

## 2023-12-04 ENCOUNTER — Encounter: Payer: Self-pay | Admitting: Neurology

## 2023-12-23 ENCOUNTER — Other Ambulatory Visit: Payer: Self-pay | Admitting: Family Medicine

## 2023-12-27 ENCOUNTER — Other Ambulatory Visit: Payer: Self-pay | Admitting: Family Medicine

## 2023-12-31 DIAGNOSIS — H401131 Primary open-angle glaucoma, bilateral, mild stage: Secondary | ICD-10-CM | POA: Diagnosis not present

## 2023-12-31 DIAGNOSIS — H401132 Primary open-angle glaucoma, bilateral, moderate stage: Secondary | ICD-10-CM | POA: Diagnosis not present

## 2023-12-31 LAB — HM DIABETES EYE EXAM

## 2024-01-15 ENCOUNTER — Ambulatory Visit: Payer: Medicare Other | Admitting: Urology

## 2024-01-17 ENCOUNTER — Ambulatory Visit: Payer: Medicare Other | Admitting: Urology

## 2024-01-17 ENCOUNTER — Encounter: Payer: Self-pay | Admitting: Urology

## 2024-01-17 VITALS — BP 145/82 | HR 72

## 2024-01-17 DIAGNOSIS — N138 Other obstructive and reflux uropathy: Secondary | ICD-10-CM

## 2024-01-17 DIAGNOSIS — N529 Male erectile dysfunction, unspecified: Secondary | ICD-10-CM | POA: Diagnosis not present

## 2024-01-17 DIAGNOSIS — D649 Anemia, unspecified: Secondary | ICD-10-CM

## 2024-01-17 DIAGNOSIS — N401 Enlarged prostate with lower urinary tract symptoms: Secondary | ICD-10-CM

## 2024-01-17 DIAGNOSIS — M109 Gout, unspecified: Secondary | ICD-10-CM

## 2024-01-17 DIAGNOSIS — E118 Type 2 diabetes mellitus with unspecified complications: Secondary | ICD-10-CM

## 2024-01-17 DIAGNOSIS — E559 Vitamin D deficiency, unspecified: Secondary | ICD-10-CM

## 2024-01-17 DIAGNOSIS — G2581 Restless legs syndrome: Secondary | ICD-10-CM

## 2024-01-17 DIAGNOSIS — R972 Elevated prostate specific antigen [PSA]: Secondary | ICD-10-CM

## 2024-01-17 LAB — URINALYSIS, ROUTINE W REFLEX MICROSCOPIC
Bilirubin, UA: NEGATIVE
Glucose, UA: NEGATIVE
Ketones, UA: NEGATIVE
Leukocytes,UA: NEGATIVE
Nitrite, UA: NEGATIVE
Protein,UA: NEGATIVE
RBC, UA: NEGATIVE
Specific Gravity, UA: 1.015 (ref 1.005–1.030)
Urobilinogen, Ur: 0.2 mg/dL (ref 0.2–1.0)
pH, UA: 5.5 (ref 5.0–7.5)

## 2024-01-17 MED ORDER — TAMSULOSIN HCL 0.4 MG PO CAPS: ORAL_CAPSULE | ORAL | 3 refills | Status: AC

## 2024-01-17 MED ORDER — FINASTERIDE 5 MG PO TABS: 5.0000 mg | ORAL_TABLET | Freq: Every day | ORAL | 3 refills | Status: AC

## 2024-01-17 MED ORDER — SILDENAFIL CITRATE 100 MG PO TABS: 100.0000 mg | ORAL_TABLET | ORAL | 11 refills | Status: AC | PRN

## 2024-01-17 NOTE — Progress Notes (Signed)
   Assessment: 1. Elevated PSA   2. Benign prostatic hyperplasia with urinary obstruction   3. Erectile dysfunction, unspecified erectile dysfunction type         Plan: Patient will continue combination medical therapy with tamsulosin and finasteride Continue sildenafil as needed for ED Patient will follow-up in 1 year or sooner if problems arise  Chief Complaint: BPH  HPI: Daniel Gates is a 78 y.o. male who presents for continued evaluation of a number of urologic issues. Please see my note 01/11/2023 at the time of the last visit for detailed history and exam. Patient has a long history of prior elevated PSA status post 2 prior negative biopsies.  Since 2019 he has been on combination medical therapy with finasteride and tamsulosin and has done quite well with stable lower urinary tract symptoms.  His last PSA January 2024 was 4.37 with 35% free component.  At that time we elected not to further monitor PSA given his advanced age. Patient currently reports that his lower urinary tract symptoms have remained stable.  Current IPSS = 16.  He also reports that sildenafil continues to work well for his ED.     Portions of the above documentation were copied from a prior visit for review purposes only.  Allergies: No Known Allergies  PMH: Past Medical History:  Diagnosis Date   Allergic rhinitis    Anxiety 2003   "after cancer surgery"   Asthma    childhood per pt.   Bradycardia 10/01/2016   Cancer (HCC)    colon; diagnosed 2003   Cataract 12/29/2016   not sure which eye   Chronic renal insufficiency 06/07/2017   Depression    Diabetes type 2, controlled (HCC) 06/27/2012   Glaucoma    Gout    Hyperlipidemia    Hypertension    Medicare annual wellness visit, subsequent 02/15/2014   Open-angle glaucoma 12/29/2016   Sickle cell anemia (HCC) 06/12/2017   "they say I have a trace"   TMJ arthralgia 01/02/2016    PSH: Past Surgical History:  Procedure Laterality Date    COLON SURGERY  2003   for colon cancer   COLONOSCOPY     INGUINAL HERNIA REPAIR  1997   right    SH: Social History   Tobacco Use   Smoking status: Never   Smokeless tobacco: Never  Vaping Use   Vaping status: Never Used  Substance Use Topics   Alcohol use: No   Drug use: No    ROS: Constitutional:  Negative for fever, chills, weight loss CV: Negative for chest pain, previous MI, hypertension Respiratory:  Negative for shortness of breath, wheezing, sleep apnea, frequent cough GI:  Negative for nausea, vomiting, bloody stool, GERD  PE: Vitals:   01/17/24 1125  BP: (!) 145/82  Pulse: 72    GENERAL APPEARANCE:  Well appearing, well developed, well nourished, NAD    Results: UA clear

## 2024-02-05 ENCOUNTER — Other Ambulatory Visit: Payer: Self-pay | Admitting: Family Medicine

## 2024-02-28 ENCOUNTER — Other Ambulatory Visit: Payer: Self-pay | Admitting: Family Medicine

## 2024-03-18 DIAGNOSIS — E1122 Type 2 diabetes mellitus with diabetic chronic kidney disease: Secondary | ICD-10-CM | POA: Diagnosis not present

## 2024-03-18 DIAGNOSIS — N1832 Chronic kidney disease, stage 3b: Secondary | ICD-10-CM | POA: Diagnosis not present

## 2024-03-18 DIAGNOSIS — I129 Hypertensive chronic kidney disease with stage 1 through stage 4 chronic kidney disease, or unspecified chronic kidney disease: Secondary | ICD-10-CM | POA: Diagnosis not present

## 2024-03-26 ENCOUNTER — Telehealth: Payer: Self-pay | Admitting: *Deleted

## 2024-03-26 DIAGNOSIS — G8929 Other chronic pain: Secondary | ICD-10-CM

## 2024-03-26 NOTE — Telephone Encounter (Signed)
 Left detailed message that referral has been put in.

## 2024-03-26 NOTE — Telephone Encounter (Signed)
 Copied from CRM [id number]. Topic: Referral - Request for Referral >> Mar 26, 2024 11:02 AM Adonis Hoot wrote: Did the patient discuss referral with their provider in the last year? Yes (If No - schedule appointment) (If Yes - send message)  Appointment offered? No  Type of order/referral and detailed reason for visit: orthopedic/ Right knee pain  [place] of office, provider, location: [place]  If referral order, have you been seen by this specialty before? No (If Yes, this issue or another issue? When? Where?  Can we respond through MyChart? No

## 2024-04-02 ENCOUNTER — Encounter: Payer: Self-pay | Admitting: Sports Medicine

## 2024-04-02 ENCOUNTER — Ambulatory Visit: Admitting: Sports Medicine

## 2024-04-02 VITALS — BP 140/86 | Ht 73.0 in | Wt 199.0 lb

## 2024-04-02 DIAGNOSIS — G8929 Other chronic pain: Secondary | ICD-10-CM

## 2024-04-02 DIAGNOSIS — M25561 Pain in right knee: Secondary | ICD-10-CM | POA: Diagnosis not present

## 2024-04-02 NOTE — Progress Notes (Signed)
   Subjective:    Patient ID: Daniel Gates, male    DOB: [birth year], 78 y.o.   MRN: [id number]  HPI chief complaint: Right knee pain  Daniel Gates is a very pleasant 78 year old male that presents today with approximately 6 weeks of right knee pain.  No trauma that he can recall.  Pain is diffuse throughout the knee.  He does get occasional swelling.  He is unable to take NSAIDs due to chronic kidney disease so he has been treating his knee pain with topical medications.  He also has a brace which is somewhat helpful.  Denies problems with the knee in the past.  Nonstaining x-rays of his knee done recently showed no significant degenerative changes and a positive joint effusion.  Past medical history reviewed Medications reviewed Allergies reviewed  Review of Systems As above    Objective:   Physical Exam  Well-developed, well-nourished.  No acute distress  Right knee: Good range of motion.  No obvious effusion.  He is slightly tender to palpation along both medial and lateral joint lines but a negative McMurray's.  Trace patellofemoral crepitus.  Knee is grossly stable ligamentous exam.  He does have some mild quad atrophy.  Neurovascularly intact distally.  X-rays of the right knee are as above      Assessment & Plan:   Right knee pain and swelling likely secondary to DJD Chronic kidney disease  His x-rays were done nonweightbearing and there is not a sunrise view.  He likely has a little more osteoarthritis than what those films suggest.  We discussed treatment options including cortisone injection, physical therapy, and home exercises.  He would like to start with home exercises first.  He will do daily isometric straight leg raises and hip abductor exercises.  He understands that both cortisone injection and physical therapy are options down the road if his symptoms worsen.  He may continue with his knee brace when active.  Follow-up as needed.  This note was dictated using Dragon  naturally speaking software and may contain errors in syntax, spelling, or content which have not been identified prior to signing this note.

## 2024-04-07 ENCOUNTER — Other Ambulatory Visit: Payer: Self-pay | Admitting: Family Medicine

## 2024-04-17 ENCOUNTER — Other Ambulatory Visit: Payer: Self-pay | Admitting: Family Medicine

## 2024-05-14 ENCOUNTER — Telehealth: Payer: Self-pay | Admitting: Family Medicine

## 2024-05-14 ENCOUNTER — Other Ambulatory Visit: Payer: Self-pay

## 2024-05-14 NOTE — Telephone Encounter (Signed)
Second attempt made no answer.

## 2024-05-14 NOTE — Telephone Encounter (Signed)
 Last Refilled in 2022- will need an appt first.

## 2024-05-14 NOTE — Telephone Encounter (Signed)
 See telephone encounter.  Requested refill oxycodone, not on med list. Called to triage, no answer left message to call back for triage.

## 2024-05-14 NOTE — Telephone Encounter (Signed)
-  Patient called back to clarify prescription for refill request. Request for Hydrocodone -acetaminophen  sent to clinic for processing.

## 2024-05-14 NOTE — Telephone Encounter (Unsigned)
 Copied from CRM [id number]. Topic: Clinical - Medication Refill >> May 14, 2024  2:10 PM Juleen Oakland F wrote: Medication: Oxycodine   Has the patient contacted their pharmacy? Yes (Agent: If no, request that the patient contact the pharmacy for the refill. If patient does not wish to contact the pharmacy document the reason why and proceed with request.) (Agent: If yes, when and what did the pharmacy advise?)  This is the patient's preferred pharmacy:   [id number], [place] - [address] AT [facility] [address] [place] [address] Phone: [phone] Fax: [phone]   Is this the correct pharmacy for this prescription? Yes If no, delete pharmacy and type the correct one.   Has the prescription been filled recently? Yes  Is the patient out of the medication? No  Has the patient been seen for an appointment in the last year OR does the patient have an upcoming appointment? Yes  Can we respond through MyChart? No  Agent: Please be advised that Rx refills may take up to 3 business days. We ask that you follow-up with your pharmacy.

## 2024-05-25 ENCOUNTER — Other Ambulatory Visit: Payer: Self-pay | Admitting: Family Medicine

## 2024-06-09 ENCOUNTER — Other Ambulatory Visit: Payer: Self-pay | Admitting: Family Medicine

## 2024-07-09 ENCOUNTER — Ambulatory Visit: Admitting: Family Medicine

## 2024-07-09 ENCOUNTER — Other Ambulatory Visit: Payer: Self-pay

## 2024-07-09 VITALS — BP 142/88 | Ht 72.0 in | Wt 212.0 lb

## 2024-07-09 DIAGNOSIS — M25561 Pain in right knee: Secondary | ICD-10-CM | POA: Diagnosis not present

## 2024-07-09 DIAGNOSIS — G8929 Other chronic pain: Secondary | ICD-10-CM

## 2024-07-09 MED ORDER — METHYLPREDNISOLONE ACETATE 40 MG/ML IJ SUSP
40.0000 mg | Freq: Once | INTRAMUSCULAR | Status: AC
  Administered 2024-07-09: 40 mg via INTRA_ARTICULAR

## 2024-07-09 NOTE — Patient Instructions (Signed)
 Your pain is due to arthritis. These are the different medications you can take for this: Tylenol  500mg  1-2 tabs three times a day for pain. Voltaren  gel, capsaicin, aspercreme, or biofreeze topically up to four times a day may also help with pain. Some supplements that may help for arthritis: Boswellia extract, curcumin, pycnogenol Aleve 1-2 tabs twice a day with food as needed if you don't have kidney or stomach issues. Cortisone injections are an option - you were given this today. It's important that you continue to stay active. Straight leg raises, knee extensions 3 sets of 10 once a day (add ankle weight if these become too easy). Consider physical therapy to strengthen muscles around the joint that hurts to take pressure off of the joint itself. Shoe inserts with good arch support may be helpful. Heat or ice 15 minutes at a time 3-4 times a day as needed to help with pain. Water aerobics and cycling with low resistance are the best two types of exercise for arthritis though any exercise is ok as long as it doesn't worsen the pain. Follow up with me as needed.

## 2024-07-09 NOTE — Progress Notes (Unsigned)
 PCP: Domenica Harlene LABOR, MD  Subjective:   HPI: Patient is a 78 y.o. male here for evaluation of worsening right knee pain.   He was last seen for right knee pain in clinic by Dr. Arvell on 04/02/2024. At this time, he opted to try home exercises and topical medications such as Aspercreme. X-rays obtained of the right knee on 11/08/2023 were non-weight bearing and without sunrise view. He says that his pain has been getting worse and that taking Tylenol  has not been helpful. He reports that his pain is worse with walking/activity and that he has been having increased stiffness in the knee if he has been seated for a while. He denies any history of injury or trauma to his right knee.   Past Medical History:  Diagnosis Date   Allergic rhinitis    Anxiety 2003   after cancer surgery   Asthma    childhood per pt.   Bradycardia 10/01/2016   Cancer (HCC)    colon; diagnosed 2003   Cataract 12/29/2016   not sure which eye   Chronic renal insufficiency 06/07/2017   Depression    Diabetes type 2, controlled (HCC) 06/27/2012   Glaucoma    Gout    Hyperlipidemia    Hypertension    Medicare annual wellness visit, subsequent 02/15/2014   Open-angle glaucoma 12/29/2016   Sickle cell anemia (HCC) 06/12/2017   they say I have a trace   TMJ arthralgia 01/02/2016    Current Outpatient Medications on File Prior to Visit  Medication Sig Dispense Refill   allopurinol  (ZYLOPRIM ) 100 MG tablet TAKE 1 TABLET BY MOUTH DAILY 90 tablet 0   aspirin 325 MG tablet Take 325 mg by mouth daily.     atorvastatin  (LIPITOR) 40 MG tablet TAKE 1 TABLET BY MOUTH AT  BEDTIME 100 tablet 1   Blood Glucose Monitoring Suppl (TRUE METRIX AIR GLUCOSE METER) w/Device KIT Use to check blood sugar twice a day.  Dx code: E11.9 1 kit 0   COD LIVER OIL PO Take 1 tablet by mouth daily.     diclofenac  Sodium (VOLTAREN ) 1 % GEL Apply 2 g topically 4 (four) times daily. 50 g 2   dicyclomine  (BENTYL ) 20 MG tablet TAKE 1 TABLET TWICE  DAILY 180 tablet 10   escitalopram  (LEXAPRO ) 10 MG tablet TAKE 1 TABLET BY MOUTH DAILY 100 tablet 3   fenofibrate  (TRICOR ) 145 MG tablet TAKE 1 TABLET BY MOUTH DAILY 100 tablet 2   finasteride  (PROSCAR ) 5 MG tablet Take 1 tablet (5 mg total) by mouth daily. 90 tablet 3   Garlic Oil 1000 MG CAPS Take 1 capsule by mouth daily.     glucose blood (TRUE METRIX BLOOD GLUCOSE TEST) test strip Use to check blood sugar twice a day.  Dx code: E11.9 200 each 1   hydrochlorothiazide  (MICROZIDE ) 12.5 MG capsule Take 1 capsule (12.5 mg total) by mouth daily. 90 capsule 3   HYDROcodone -acetaminophen  (NORCO) 10-325 MG tablet Take 1 tablet by mouth 2 (two) times daily as needed. 30 tablet 0   latanoprost  (XALATAN ) 0.005 % ophthalmic solution Place 1 drop into both eyes nightly. 2.5 mL 0   metFORMIN  (GLUCOPHAGE ) 500 MG tablet TAKE 1 TABLET BY MOUTH TWICE  DAILY WITH MEALS 200 tablet 2   Multiple Vitamins-Minerals (ONE-A-DAY MENS HEALTH FORMULA) TABS Take 1 tablet by mouth daily.     pioglitazone  (ACTOS ) 15 MG tablet TAKE 1 TABLET(15 MG) BY MOUTH DAILY 30 tablet 3   promethazine  (PHENERGAN ) 25  MG tablet TAKE ONE (1) TABLET(S) EVERY FOUR (4) HOURS AS NEEDED FOR NAUSEA 60 tablet 0   rOPINIRole  (REQUIP ) 1 MG tablet TAKE 1 TO 2 TABLETS BY MOUTH AT  BEDTIME 200 tablet 2   sildenafil  (VIAGRA ) 100 MG tablet Take 1 tablet (100 mg total) by mouth as needed. Take 1-2 hrs prior to sex on an empty stomach 10 tablet 11   tamsulosin  (FLOMAX ) 0.4 MG CAPS capsule TAKE 1 CAPSULE EVERY DAY AFTER SUPPER 90 capsule 3   TRUEplus Lancets 33G MISC Use to check blood sugar twice a day.  Dx code: E11.9 200 each 1   No current facility-administered medications on file prior to visit.    Past Surgical History:  Procedure Laterality Date   COLON SURGERY  2003   for colon cancer   COLONOSCOPY     INGUINAL HERNIA REPAIR  1997   right    No Known Allergies  BP (!) 142/88   Ht 6' (1.829 m)   Wt 212 lb (96.2 kg)   BMI 28.75 kg/m        No data to display              No data to display              Objective:  Physical Exam:  Gen: NAD, comfortable in exam room Inspection: There is a mild fluid collection of the right knee compared to the left knee  Palpation: There is slight tenderness to palpation along the medial and lateral joint line, as well as along the lateral boarders of the patella  ROM: FROM with knee extension, limited degree of knee flexion due to pain Strength: 5/5 with hip flexion, knee extension/flexion, and dorsal/plantar flexion  Neuro/Vasc: neurovascularly intact distally  Special Tests: Stable ligamentous exam. Apley compression test positive  Ultrasound of right knee: Revealed a mild fluid collection.    Assessment & Plan:  1. Chronic right knee pain - Ongoing right knee pain likely secondary to osteoarthritis/degenerative joint disease. X-rays obtained 11/2023 were non-weight bearing and without sunrise view. Discussed treatment options with patient today including NSAIDs, physical therapy, and a cortisone injection for symptom relief. Patient opted to treat with ultrasound guided cortisone injection today.   - Cortisone injection today  - NSAIDs, heat, and ice as needed  - Recommended physical therapy to strengthen muscles and offload pressure on joint  - Encouraged low resistance/impact exercises

## 2024-07-10 ENCOUNTER — Encounter: Payer: Self-pay | Admitting: Family Medicine

## 2024-07-14 DIAGNOSIS — N1832 Chronic kidney disease, stage 3b: Secondary | ICD-10-CM | POA: Diagnosis not present

## 2024-07-14 DIAGNOSIS — I10 Essential (primary) hypertension: Secondary | ICD-10-CM | POA: Diagnosis not present

## 2024-07-15 ENCOUNTER — Ambulatory Visit

## 2024-07-15 VITALS — Ht 72.0 in | Wt 212.0 lb

## 2024-07-15 DIAGNOSIS — Z0000 Encounter for general adult medical examination without abnormal findings: Secondary | ICD-10-CM | POA: Diagnosis not present

## 2024-07-15 NOTE — Patient Instructions (Addendum)
 Daniel Gates , Thank you for taking time out of your busy schedule to complete your Annual Wellness Visit with me. I enjoyed our conversation and look forward to speaking with you again next year. I, as well as your care team,  appreciate your ongoing commitment to your health goals. Please review the following plan we discussed and let me know if I can assist you in the future. Your Game plan/ To Do List    Referrals: If you haven't heard from the office you've been referred to, please reach out to them at the phone provided.   Follow up Visits: We will see or speak with you next year for your Next Medicare AWV with our clinical staff 07/21/25 @ 11:30a Have you seen your provider in the last 6 months (3 months if uncontrolled diabetes)?   Clinician Recommendations:  Aim for 30 minutes of exercise or brisk walking, 6-8 glasses of water, and 5 servings of fruits and vegetables each day.       This is a list of the screenings recommended for you:  Health Maintenance  Topic Date Due   Pneumococcal Vaccine for age over 76 (1 of 2 - PCV) Never done   Zoster (Shingles) Vaccine (1 of 2) Never done   Yearly kidney health urinalysis for diabetes  10/14/2016   Complete foot exam   05/18/2017   Hemoglobin A1C  05/08/2024   Flu Shot  07/04/2024   COVID-19 Vaccine (5 - 2024-25 season) 11/07/2024*   Yearly kidney function blood test for diabetes  11/07/2024   Eye exam for diabetics  12/30/2024   Medicare Annual Wellness Visit  07/15/2025   Colon Cancer Screening  08/18/2027   DTaP/Tdap/Td vaccine (4 - Td or Tdap) 07/13/2029   Hepatitis C Screening  Completed   Hepatitis B Vaccine  Aged Out   HPV Vaccine  Aged Out   Meningitis B Vaccine  Aged Out  *Topic was postponed. The date shown is not the original due date.   Opioid Pain Medicine Management Opioids are powerful medicines that are used to treat moderate to severe pain. When used for short periods of time, they can help you to: Sleep  better. Do better in physical or occupational therapy. Feel better in the first few days after an injury. Recover from surgery. Opioids should be taken with the supervision of a trained health care provider. They should be taken for the shortest period of time possible. This is because opioids can be addictive, and the longer you take opioids, the greater your risk of addiction. This addiction can also be called opioid use disorder. What are the risks? Using opioid pain medicines for longer than 3 days increases your risk of side effects. Side effects include: Constipation. Nausea and vomiting. Breathing difficulties (respiratory depression). Drowsiness. Confusion. Opioid use disorder. Itching. Taking opioid pain medicine for a long period of time can affect your ability to do daily tasks. It also puts you at risk for: Motor vehicle crashes. Depression. Suicide. Heart attack. Overdose, which can be life-threatening. What is a pain treatment plan? A pain treatment plan is an agreement between you and your health care provider. Pain is unique to each person, and treatments vary depending on your condition. To manage your pain, you and your health care provider need to work together. To help you do this: Discuss the goals of your treatment, including how much pain you might expect to have and how you will manage the pain. Review the risks and benefits  of taking opioid medicines. Remember that a good treatment plan uses more than one approach and minimizes the chance of side effects. Be honest about the amount of medicines you take and about any drug or alcohol use. Get pain medicine prescriptions from only one health care provider. Pain can be managed with many types of alternative treatments. Ask your health care provider to refer you to one or more specialists who can help you manage pain through: Physical or occupational therapy. Counseling (cognitive behavioral therapy). Good  nutrition. Biofeedback. Massage. Meditation. Non-opioid medicine. Following a gentle exercise program. How to use opioid pain medicine Taking medicine Take your pain medicine exactly as told by your health care provider. Take it only when you need it. If your pain gets less severe, you may take less than your prescribed dose if your health care provider approves. If you are not having pain, do nottake pain medicine unless your health care provider tells you to take it. If your pain is severe, do nottry to treat it yourself by taking more pills than instructed on your prescription. Contact your health care provider for help. Write down the times when you take your pain medicine. It is easy to become confused while on pain medicine. Writing the time can help you avoid overdose. Take other over-the-counter or prescription medicines only as told by your health care provider. Keeping yourself and others safe  While you are taking opioid pain medicine: Do not drive, use machinery, or power tools. Do not sign legal documents. Do not drink alcohol. Do not take sleeping pills. Do not supervise children by yourself. Do not do activities that require climbing or being in high places. Do not go to a lake, river, ocean, spa, or swimming pool. Do not share your pain medicine with anyone. Keep pain medicine in a locked cabinet or in a secure area where pets and children cannot reach it. Stopping your use of opioids If you have been taking opioid medicine for more than a few weeks, you may need to slowly decrease (taper) how much you take until you stop completely. Tapering your use of opioids can decrease your risk of symptoms of withdrawal, such as: Pain and cramping in the abdomen. Nausea. Sweating. Sleepiness. Restlessness. Uncontrollable shaking (tremors). Cravings for the medicine. Do not attempt to taper your use of opioids on your own. Talk with your health care provider about how to do  this. Your health care provider may prescribe a step-down schedule based on how much medicine you are taking and how long you have been taking it. Getting rid of leftover pills Do not save any leftover pills. Get rid of leftover pills safely by: Taking the medicine to a prescription take-back program. This is usually offered by the county or law enforcement. Bringing them to a pharmacy that has a drug disposal container. Flushing them down the toilet. Check the label or package insert of your medicine to see whether this is safe to do. Throwing them out in the trash. Check the label or package insert of your medicine to see whether this is safe to do. If it is safe to throw it out, remove the medicine from the original container, put it into a sealable bag or container, and mix it with used coffee grounds, food scraps, dirt, or cat litter before putting it in the trash. Follow these instructions at home: Activity Do exercises as told by your health care provider. Avoid activities that make your pain worse. Return to your  normal activities as told by your health care provider. Ask your health care provider what activities are safe for you. General instructions You may need to take these actions to prevent or treat constipation: Drink enough fluid to keep your urine pale yellow. Take over-the-counter or prescription medicines. Eat foods that are high in fiber, such as beans, whole grains, and fresh fruits and vegetables. Limit foods that are high in fat and processed sugars, such as fried or sweet foods. Keep all follow-up visits. This is important. Where to find support If you have been taking opioids for a long time, you may benefit from receiving support for quitting from a local support group or counselor. Ask your health care provider for a referral to these resources in your area. Where to find more information Centers for Disease Control and Prevention (CDC): FootballExhibition.com.br [place] Food and  Drug Administration (FDA): PumpkinSearch.com.ee Get help right away if: You may have taken too much of an opioid (overdosed). Common symptoms of an overdose: Your breathing is slower or more shallow than normal. You have a very slow heartbeat (pulse). You have slurred speech. You have nausea and vomiting. Your pupils become very small. You have other potential symptoms: You are very confused. You faint or feel like you will faint. You have cold, clammy skin. You have blue lips or fingernails. You have thoughts of harming yourself or harming others. These symptoms may represent a serious problem that is an emergency. Do not wait to see if the symptoms will go away. Get medical help right away. Call your local emergency services (911 in the [place] Do not drive yourself to the hospital.  If you ever feel like you may hurt yourself or others, or have thoughts about taking your own life, get help right away. Go to your nearest emergency department or: Call your local emergency services (911 in the [place] Call the [facility] (404-654-8811 in the [place] Call a suicide crisis helpline, such as the National Suicide Prevention Lifeline at 3187053152 or 988 in the [place] This is open 24 hours a day in the [place] If you're a Veteran: Call 988 and press 1. This is open 24 hours a day. Text the [employer] at 937-714-2639. Summary Opioid medicines can help you manage moderate to severe pain for a short period of time. A pain treatment plan is an agreement between you and your health care provider. Discuss the goals of your treatment, including how much pain you might expect to have and how you will manage the pain. If you think that you or someone else may have taken too much of an opioid, get medical help right away. This information is not intended to replace advice given to you by your health care provider. Make sure you discuss any questions you have with your health care  provider. Document Revised: 08/27/2023 Document Reviewed: 03/02/2021 Elsevier Patient Education  2024 [employer] Advanced directives: (Declined) Advance directive discussed with you today. Even though you declined this today, please call our office should you change your mind, and we can give you the proper paperwork for you to fill out. Advance Care Planning is important because it:  [x]  Makes sure you receive the medical care that is consistent with your values, goals, and preferences  [x]  It provides guidance to your family and loved ones and reduces their decisional burden about whether or not they are making the right decisions based on your wishes.  Follow the link provided in your  after visit summary or read over the paperwork we have mailed to you to help you started getting your Advance Directives in place. If you need assistance in completing these, please reach out to us  so that we can help you!  See attachments for Preventive Care and Fall Prevention Tips.

## 2024-07-15 NOTE — Progress Notes (Signed)
 Subjective:   Daniel Gates is a 78 y.o. who presents for a Medicare Wellness preventive visit.  As a reminder, Annual Wellness Visits don't include a physical exam, and some assessments may be limited, especially if this visit is performed virtually. We may recommend an in-person follow-up visit with your provider if needed.  Visit Complete: Virtual I connected with  Daniel Gates on 07/15/24 by a audio enabled telemedicine application and verified that I am speaking with the correct person using two identifiers.  Patient Location: Home  Provider Location: Home Office  I discussed the limitations of evaluation and management by telemedicine. The patient expressed understanding and agreed to proceed.  Vital Signs: Because this visit was a virtual/telehealth visit, some criteria may be missing or patient reported. Any vitals not documented were not able to be obtained and vitals that have been documented are patient reported.    Persons Participating in Visit: Patient.  AWV Questionnaire: No: Patient Medicare AWV questionnaire was not completed prior to this visit.  Cardiac Risk Factors include: advanced age (>22men, >70 women);male gender;diabetes mellitus;hypertension     Objective:    Today's Vitals   07/15/24 1140  Weight: 212 lb (96.2 kg)  Height: 6' (1.829 m)   Body mass index is 28.75 kg/m.     07/15/2024   11:47 AM 07/13/2023   11:09 AM 07/11/2022    1:49 PM 07/07/2021    1:06 PM 07/01/2020   11:10 AM 01/27/2020    9:23 AM 06/30/2019   10:08 AM  Advanced Directives  Does Patient Have a Medical Advance Directive? No No No No No No No  Would patient like information on creating a medical advance directive? No - Patient declined No - Patient declined No - Patient declined No - Patient declined No - Patient declined No - Patient declined No - Patient declined      Data saved with a previous flowsheet row definition    Current Medications (verified) Outpatient  Encounter Medications as of 07/15/2024  Medication Sig   allopurinol  (ZYLOPRIM ) 100 MG tablet TAKE 1 TABLET BY MOUTH DAILY   aspirin 325 MG tablet Take 325 mg by mouth daily.   atorvastatin  (LIPITOR) 40 MG tablet TAKE 1 TABLET BY MOUTH AT  BEDTIME   Blood Glucose Monitoring Suppl (TRUE METRIX AIR GLUCOSE METER) w/Device KIT Use to check blood sugar twice a day.  Dx code: E11.9   COD LIVER OIL PO Take 1 tablet by mouth daily.   diclofenac  Sodium (VOLTAREN ) 1 % GEL Apply 2 g topically 4 (four) times daily.   dicyclomine  (BENTYL ) 20 MG tablet TAKE 1 TABLET TWICE DAILY   escitalopram  (LEXAPRO ) 10 MG tablet TAKE 1 TABLET BY MOUTH DAILY   fenofibrate  (TRICOR ) 145 MG tablet TAKE 1 TABLET BY MOUTH DAILY   finasteride  (PROSCAR ) 5 MG tablet Take 1 tablet (5 mg total) by mouth daily.   Garlic Oil 1000 MG CAPS Take 1 capsule by mouth daily.   glucose blood (TRUE METRIX BLOOD GLUCOSE TEST) test strip Use to check blood sugar twice a day.  Dx code: E11.9   hydrochlorothiazide  (MICROZIDE ) 12.5 MG capsule Take 1 capsule (12.5 mg total) by mouth daily.   HYDROcodone -acetaminophen  (NORCO) 10-325 MG tablet Take 1 tablet by mouth 2 (two) times daily as needed.   latanoprost  (XALATAN ) 0.005 % ophthalmic solution Place 1 drop into both eyes nightly.   metFORMIN  (GLUCOPHAGE ) 500 MG tablet TAKE 1 TABLET BY MOUTH TWICE  DAILY WITH MEALS  Multiple Vitamins-Minerals (ONE-A-DAY MENS HEALTH FORMULA) TABS Take 1 tablet by mouth daily.   pioglitazone  (ACTOS ) 15 MG tablet TAKE 1 TABLET(15 MG) BY MOUTH DAILY   promethazine  (PHENERGAN ) 25 MG tablet TAKE ONE (1) TABLET(S) EVERY FOUR (4) HOURS AS NEEDED FOR NAUSEA   rOPINIRole  (REQUIP ) 1 MG tablet TAKE 1 TO 2 TABLETS BY MOUTH AT  BEDTIME   sildenafil  (VIAGRA ) 100 MG tablet Take 1 tablet (100 mg total) by mouth as needed. Take 1-2 hrs prior to sex on an empty stomach   tamsulosin  (FLOMAX ) 0.4 MG CAPS capsule TAKE 1 CAPSULE EVERY DAY AFTER SUPPER   TRUEplus Lancets 33G MISC Use  to check blood sugar twice a day.  Dx code: E11.9   No facility-administered encounter medications on file as of 07/15/2024.    Allergies (verified) Patient has no known allergies.   History: Past Medical History:  Diagnosis Date   Allergic rhinitis    Anxiety 2003   after cancer surgery   Asthma    childhood per pt.   Bradycardia 10/01/2016   Cancer (HCC)    colon; diagnosed 2003   Cataract 12/29/2016   not sure which eye   Chronic renal insufficiency 06/07/2017   Depression    Diabetes type 2, controlled (HCC) 06/27/2012   Glaucoma    Gout    Hyperlipidemia    Hypertension    Medicare annual wellness visit, subsequent 02/15/2014   Open-angle glaucoma 12/29/2016   Sickle cell anemia (HCC) 06/12/2017   they say I have a trace   TMJ arthralgia 01/02/2016   Past Surgical History:  Procedure Laterality Date   COLON SURGERY  2003   for colon cancer   COLONOSCOPY     INGUINAL HERNIA REPAIR  1997   right   Family History  Problem Relation Age of Onset   Diabetes Mother    Heart disease Mother        MI   Cancer Father        prostate   Prostate cancer Father    Dementia Sister    Thyroid  disease Sister    Diabetes Sister    Heart disease Brother        CAD s/p CABG   Prostate cancer Brother    Colon cancer Neg Hx    Stomach cancer Neg Hx    Esophageal cancer Neg Hx    Rectal cancer Neg Hx    Social History   Socioeconomic History   Marital status: Widowed    Spouse name: Not on file   Number of children: 7   Years of education: Not on file   Highest education level: Not on file  Occupational History    Employer: DISABLED  Tobacco Use   Smoking status: Never   Smokeless tobacco: Never  Vaping Use   Vaping status: Never Used  Substance and Sexual Activity   Alcohol use: No   Drug use: No   Sexual activity: Yes  Other Topics Concern   Not on file  Social History Narrative   Not on file   Social Drivers of Health   Financial Resource Strain:  Low Risk  (07/15/2024)   Overall Financial Resource Strain (CARDIA)    Difficulty of Paying Living Expenses: Not hard at all  Food Insecurity: No Food Insecurity (07/15/2024)   Hunger Vital Sign    Worried About Running Out of Food in the Last Year: Never true    Ran Out of Food in the Last Year: Never true  Transportation Needs: No Transportation Needs (07/15/2024)   PRAPARE - Administrator, Civil Service (Medical): No    Lack of Transportation (Non-Medical): No  Physical Activity: Inactive (07/15/2024)   Exercise Vital Sign    Days of Exercise per Week: 0 days    Minutes of Exercise per Session: 0 min  Stress: No Stress Concern Present (07/15/2024)   [employer] of Occupational Health - Occupational Stress Questionnaire    Feeling of Stress: Not at all  Social Connections: Moderately Integrated (07/15/2024)   Social Connection and Isolation Panel    Frequency of Communication with Friends and Family: More than three times a week    Frequency of Social Gatherings with Friends and Family: More than three times a week    Attends Religious Services: More than 4 times per year    Active Member of [employer] or Organizations: Yes    Attends Banker Meetings: More than 4 times per year    Marital Status: Widowed    Tobacco Counseling Counseling given: Not Answered    Clinical Intake:  Pre-visit preparation completed: Yes  Pain : No/denies pain     BMI - recorded: 28.75 Nutritional Status: BMI 25 -29 Overweight Nutritional Risks: None Diabetes: Yes CBG done?: No Did pt. bring in CBG monitor from home?: No  Lab Results  Component Value Date   HGBA1C 6.3 11/08/2023   HGBA1C 6.3 07/03/2023   HGBA1C 6.4 08/24/2022     How often do you need to have someone help you when you read instructions, pamphlets, or other written materials from your doctor or pharmacy?: 1 - Never  Interpreter Needed?: No  Information entered by :: Rojelio Blush  LPN   Activities of Daily Living      07/15/2024   11:45 AM  In your present state of health, do you have any difficulty performing the following activities:  Hearing? 0  Vision? 0  Difficulty concentrating or making decisions? 0  Walking or climbing stairs? 0  Dressing or bathing? 0  Doing errands, shopping? 0  Preparing Food and eating ? N  Using the Toilet? N  In the past six months, have you accidently leaked urine? N  Do you have problems with loss of bowel control? N  Managing your Medications? N  Managing your Finances? N  Housekeeping or managing your Housekeeping? N    Patient Care Team: Domenica Harlene LABOR, MD as PCP - General (Family Medicine) Tamala Lamar PARAS, MD as Consulting Physician (Ophthalmology)   I have updated your Care Teams any recent Medical Services you may have received from other providers in the past year.     Assessment:   This is a routine wellness examination for Daniel Gates.  Hearing/Vision screen Hearing Screening - Comments:: Denies hearing difficulties   Vision Screening - Comments:: Wears rx glasses - up to date with routine eye exams with  [facility]   Goals Addressed               This Visit's Progress     Increase physical activity (pt-stated)        Get more active.       Depression Screen      07/15/2024   11:43 AM 07/13/2023   11:09 AM 07/03/2023    1:58 PM 11/07/2022   10:40 AM 08/24/2022   11:06 AM 07/11/2022    1:50 PM 05/04/2022   10:27 AM  PHQ 2/9 Scores  PHQ - 2 Score 0 0  0 0 0 0 0  PHQ- 9 Score     0      Fall Risk      07/15/2024   11:46 AM 07/13/2023   11:06 AM 07/03/2023    1:58 PM 11/07/2022   10:40 AM 08/24/2022   11:06 AM  Fall Risk   Falls in the past year? 0 0 0 0 1  Number falls in past yr: 0 0 0 0 0  Injury with Fall? 0 0 0 0 0  Risk for fall due to : No Fall Risks No Fall Risks     Follow up Falls evaluation completed Falls evaluation completed Falls evaluation completed Falls evaluation completed   Falls evaluation completed      Data saved with a previous flowsheet row definition    MEDICARE RISK AT HOME:   Medicare Risk at Home Any stairs in or around the home?: No If so, are there any without handrails?: No Home free of loose throw rugs in walkways, pet beds, electrical cords, etc?: Yes Adequate lighting in your home to reduce risk of falls?: Yes Life alert?: No Use of a cane, walker or w/c?: No Grab bars in the bathroom?: No Shower chair or bench in shower?: No Elevated toilet seat or a handicapped toilet?: No  TIMED UP AND GO:  Was the test performed?  No  Cognitive Function: 6CIT completed    06/27/2018   10:23 AM 06/26/2017   10:20 AM 06/23/2016   10:07 AM  MMSE - Mini Mental State Exam  Orientation to time 5 5  5    Orientation to Place 5 5  5    Registration 3 3  3    Attention/ Calculation 5 5  5    Recall 2 2  2    Language- name 2 objects 2 2  2    Language- repeat 1 1 1   Language- follow 3 step command 3 3  3    Language- read & follow direction 1 1  1    Write a sentence 1 1  1    Copy design 1 1  1    Total score 29 29  29       Data saved with a previous flowsheet row definition        07/15/2024   11:46 AM 07/13/2023   11:11 AM 07/11/2022    1:56 PM 07/07/2021    1:18 PM 07/01/2020   11:15 AM  6CIT Screen  What Year? 0 points 0 points 0 points 0 points 0 points  What month? 0 points 0 points 0 points 0 points 0 points  What time? 0 points 0 points 0 points 0 points 0 points  Count back from 20 0 points 0 points 0 points 0 points 0 points  Months in reverse 0 points 4 points 4 points 2 points 2 points  Repeat phrase 0 points 8 points 2 points 0 points 0 points  Total Score 0 points 12 points 6 points 2 points 2 points    Immunizations Immunization History  Administered Date(s) Administered   PFIZER(Purple Top)SARS-COV-2 Vaccination 02/01/2020, 03/10/2020, 12/28/2020   Pfizer Covid-19 Vaccine Bivalent Booster 63yrs & up 10/24/2021   Td 12/04/1997,  07/14/2019   Tdap 01/30/2012    Screening Tests Health Maintenance  Topic Date Due   Pneumococcal Vaccine: 50+ Years (1 of 2 - PCV) Never done   Zoster Vaccines- Shingrix (1 of 2) Never done   Diabetic kidney evaluation - Urine ACR  10/14/2016   FOOT EXAM  05/18/2017  HEMOGLOBIN A1C  05/08/2024   INFLUENZA VACCINE  07/04/2024   COVID-19 Vaccine (5 - 2024-25 season) 11/07/2024 (Originally 08/05/2023)   Diabetic kidney evaluation - eGFR measurement  11/07/2024   OPHTHALMOLOGY EXAM  12/30/2024   Medicare Annual Wellness (AWV)  07/15/2025   Colonoscopy  08/18/2027   DTaP/Tdap/Td (4 - Td or Tdap) 07/13/2029   Hepatitis C Screening  Completed   Hepatitis B Vaccines  Aged Out   HPV VACCINES  Aged Out   Meningococcal B Vaccine  Aged Out    Health Maintenance  Health Maintenance Due  Topic Date Due   Pneumococcal Vaccine: 50+ Years (1 of 2 - PCV) Never done   Zoster Vaccines- Shingrix (1 of 2) Never done   Diabetic kidney evaluation - Urine ACR  10/14/2016   FOOT EXAM  05/18/2017   HEMOGLOBIN A1C  05/08/2024   INFLUENZA VACCINE  07/04/2024   Health Maintenance Items Addressed:   Additional Screening:  Vision Screening: Recommended annual ophthalmology exams for early detection of glaucoma and other disorders of the eye. Would you like a referral to an eye doctor? No    Dental Screening: Recommended annual dental exams for proper oral hygiene  Community Resource Referral / Chronic Care Management: CRR required this visit?  No   CCM required this visit?  No   Plan:    I have personally reviewed and noted the following in the patient's chart:   Medical and social history Use of alcohol, tobacco or illicit drugs  Current medications and supplements including opioid prescriptions. Patient is currently taking opioid prescriptions. Information provided to patient regarding non-opioid alternatives. Patient advised to discuss non-opioid treatment plan with their  provider. Functional ability and status Nutritional status Physical activity Advanced directives List of other physicians Hospitalizations, surgeries, and ER visits in previous 12 months Vitals Screenings to include cognitive, depression, and falls Referrals and appointments  In addition, I have reviewed and discussed with patient certain preventive protocols, quality metrics, and best practice recommendations. A written personalized care plan for preventive services as well as general preventive health recommendations were provided to patient.   Rojelio LELON Blush, LPN   [id number]   After Visit Summary: (MyChart) Due to this being a telephonic visit, the after visit summary with patients personalized plan was offered to patient via MyChart   Notes: Nothing significant to report at this time.

## 2024-07-17 ENCOUNTER — Ambulatory Visit

## 2024-07-18 DIAGNOSIS — N1832 Chronic kidney disease, stage 3b: Secondary | ICD-10-CM | POA: Diagnosis not present

## 2024-07-18 DIAGNOSIS — E119 Type 2 diabetes mellitus without complications: Secondary | ICD-10-CM | POA: Diagnosis not present

## 2024-08-07 ENCOUNTER — Telehealth: Payer: Self-pay | Admitting: Pharmacist

## 2024-08-07 NOTE — Progress Notes (Signed)
 Pharmacy Quality Measure Review  This patient is appearing on a report for being at risk of failing the adherence measure for diabetes medications this calendar year.   Medication: pioglitazone  15mg  Last fill date: 06/17/2024 for 30 day supply   He has missed > 100 days of pioglitazone  in 2025 - filled 30 day supply on 12/28/2023, 03/26/2024, 04/22/2024 and 06/17/2024.  He has also filled metformin  once 12/27/2023 for 90 days  Patient is past due to follow up with PCP. Was recommended that he come in around June 2025.   Tried to contact patient by phone today to discuss medication adherence and make appt with either DR Blyth or Tayor Beck, NP. Had to LM on VM with CB# [phone]  Madelin Ray, PharmD Clinical Pharmacist [facility]  Population Health [phone]

## 2024-08-11 ENCOUNTER — Other Ambulatory Visit: Payer: Self-pay | Admitting: Family Medicine

## 2024-08-11 ENCOUNTER — Telehealth: Payer: Self-pay | Admitting: Pharmacist

## 2024-08-11 NOTE — Telephone Encounter (Signed)
 [employer] representative left a message on VM of clinical pharmacist. [employer] agent did make appointment for patient to see [name] later this week.  Patient was returning my call from 08/07/2024.  Patient is due to have prescriptions updated but needs to be seen.  Tried to call patient to return his call but I was not able to reach him / call dropped.

## 2024-08-11 NOTE — Progress Notes (Unsigned)
 Subjective:     Patient ID: Daniel Gates, male    DOB: Jul 18, 1946, 78 y.o.   MRN: [id number]  No chief complaint on file.   HPI  Discussed the use of AI scribe software for clinical note transcription with the patient, who gave verbal consent to proceed.  HTN- hydrochlorothiazide  12.5 mg daily DM Type 2- Metformin  500 mg BID, Actos  15 mg daily???*** Gout- allopurinol  HLD- Lipitor and fenofibrate   BPH -Finateride (Proscar ) 5 mg daily; Tamulosin (FLOMAX )  ED- Sildenafil  100 mg prn- Follows with Urology Restless legs***?- Requip  1 mg HS Depression- escitalopram  (Lexapro ) 10 mg daily??  Patient denies fever, chills, SOB, CP, palpitations, dyspnea, edema, HA, vision changes, N/V/D, abdominal pain, urinary symptoms, rash, weight changes, and recent illness or hospitalizations.   History of Present Illness              Health Maintenance Due  Topic Date Due   Pneumococcal Vaccine: 50+ Years (1 of 2 - PCV) Never done   Zoster Vaccines- Shingrix (1 of 2) Never done   Diabetic kidney evaluation - Urine ACR  10/14/2016   FOOT EXAM  05/18/2017   HEMOGLOBIN A1C  05/08/2024   Influenza Vaccine  07/04/2024   COVID-19 Vaccine (5 - 2025-26 season) 08/04/2024    Past Medical History:  Diagnosis Date   Allergic rhinitis    Anxiety 2003   after cancer surgery   Asthma    childhood per pt.   Bradycardia 10/01/2016   Cancer (HCC)    colon; diagnosed 2003   Cataract 12/29/2016   not sure which eye   Chronic renal insufficiency 06/07/2017   Depression    Diabetes type 2, controlled (HCC) 06/27/2012   Glaucoma    Gout    Hyperlipidemia    Hypertension    Medicare annual wellness visit, subsequent 02/15/2014   Open-angle glaucoma 12/29/2016   Sickle cell anemia (HCC) 06/12/2017   they say I have a trace   TMJ arthralgia 01/02/2016    Past Surgical History:  Procedure Laterality Date   COLON SURGERY  2003   for colon cancer   COLONOSCOPY     INGUINAL HERNIA REPAIR   1997   right    Family History  Problem Relation Age of Onset   Diabetes Mother    Heart disease Mother        MI   Cancer Father        prostate   Prostate cancer Father    Dementia Sister    Thyroid  disease Sister    Diabetes Sister    Heart disease Brother        CAD s/p CABG   Prostate cancer Brother    Colon cancer Neg Hx    Stomach cancer Neg Hx    Esophageal cancer Neg Hx    Rectal cancer Neg Hx     Social History   Socioeconomic History   Marital status: Widowed    Spouse name: Not on file   Number of children: 7   Years of education: Not on file   Highest education level: Not on file  Occupational History    Employer: DISABLED  Tobacco Use   Smoking status: Never   Smokeless tobacco: Never  Vaping Use   Vaping status: Never Used  Substance and Sexual Activity   Alcohol use: No   Drug use: No   Sexual activity: Yes  Other Topics Concern   Not on file  Social History Narrative   Not  on file   Social Drivers of Health   Financial Resource Strain: Low Risk  (07/15/2024)   Overall Financial Resource Strain (CARDIA)    Difficulty of Paying Living Expenses: Not hard at all  Food Insecurity: No Food Insecurity (07/15/2024)   Hunger Vital Sign    Worried About Running Out of Food in the Last Year: Never true    Ran Out of Food in the Last Year: Never true  Transportation Needs: No Transportation Needs (07/15/2024)   PRAPARE - Administrator, Civil Service (Medical): No    Lack of Transportation (Non-Medical): No  Physical Activity: Inactive (07/15/2024)   Exercise Vital Sign    Days of Exercise per Week: 0 days    Minutes of Exercise per Session: 0 min  Stress: No Stress Concern Present (07/15/2024)   [employer] of Occupational Health - Occupational Stress Questionnaire    Feeling of Stress: Not at all  Social Connections: Moderately Integrated (07/15/2024)   Social Connection and Isolation Panel    Frequency of Communication with  Friends and Family: More than three times a week    Frequency of Social Gatherings with Friends and Family: More than three times a week    Attends Religious Services: More than 4 times per year    Active Member of [employer] or Organizations: Yes    Attends Banker Meetings: More than 4 times per year    Marital Status: Widowed  Intimate Partner Violence: Not At Risk (07/15/2024)   Humiliation, Afraid, Rape, and Kick questionnaire    Fear of Current or Ex-Partner: No    Emotionally Abused: No    Physically Abused: No    Sexually Abused: No    Outpatient Medications Prior to Visit  Medication Sig Dispense Refill   allopurinol  (ZYLOPRIM ) 100 MG tablet TAKE 1 TABLET BY MOUTH DAILY 90 tablet 0   aspirin 325 MG tablet Take 325 mg by mouth daily.     atorvastatin  (LIPITOR) 40 MG tablet TAKE 1 TABLET BY MOUTH AT  BEDTIME 100 tablet 1   Blood Glucose Monitoring Suppl (TRUE METRIX AIR GLUCOSE METER) w/Device KIT Use to check blood sugar twice a day.  Dx code: E11.9 1 kit 0   COD LIVER OIL PO Take 1 tablet by mouth daily.     diclofenac  Sodium (VOLTAREN ) 1 % GEL Apply 2 g topically 4 (four) times daily. 50 g 2   dicyclomine  (BENTYL ) 20 MG tablet Take 1 tablet (20 mg total) by mouth 2 (two) times daily. 180 tablet 0   escitalopram  (LEXAPRO ) 10 MG tablet TAKE 1 TABLET BY MOUTH DAILY 100 tablet 3   fenofibrate  (TRICOR ) 145 MG tablet TAKE 1 TABLET BY MOUTH DAILY 100 tablet 2   finasteride  (PROSCAR ) 5 MG tablet Take 1 tablet (5 mg total) by mouth daily. 90 tablet 3   Garlic Oil 1000 MG CAPS Take 1 capsule by mouth daily.     glucose blood (TRUE METRIX BLOOD GLUCOSE TEST) test strip Use to check blood sugar twice a day.  Dx code: E11.9 200 each 1   hydrochlorothiazide  (MICROZIDE ) 12.5 MG capsule Take 1 capsule (12.5 mg total) by mouth daily. 90 capsule 3   HYDROcodone -acetaminophen  (NORCO) 10-325 MG tablet Take 1 tablet by mouth 2 (two) times daily as needed. 30 tablet 0   latanoprost  (XALATAN )  0.005 % ophthalmic solution Place 1 drop into both eyes nightly. 2.5 mL 0   metFORMIN  (GLUCOPHAGE ) 500 MG tablet TAKE 1 TABLET BY MOUTH  TWICE  DAILY WITH MEALS 200 tablet 2   Multiple Vitamins-Minerals (ONE-A-DAY MENS HEALTH FORMULA) TABS Take 1 tablet by mouth daily.     pioglitazone  (ACTOS ) 15 MG tablet TAKE 1 TABLET(15 MG) BY MOUTH DAILY 30 tablet 3   promethazine  (PHENERGAN ) 25 MG tablet TAKE ONE (1) TABLET(S) EVERY FOUR (4) HOURS AS NEEDED FOR NAUSEA 60 tablet 0   rOPINIRole  (REQUIP ) 1 MG tablet TAKE 1 TO 2 TABLETS BY MOUTH AT  BEDTIME 200 tablet 2   sildenafil  (VIAGRA ) 100 MG tablet Take 1 tablet (100 mg total) by mouth as needed. Take 1-2 hrs prior to sex on an empty stomach 10 tablet 11   tamsulosin  (FLOMAX ) 0.4 MG CAPS capsule TAKE 1 CAPSULE EVERY DAY AFTER SUPPER 90 capsule 3   TRUEplus Lancets 33G MISC Use to check blood sugar twice a day.  Dx code: E11.9 200 each 1   No facility-administered medications prior to visit.    No Known Allergies  ROS     Objective:    Physical Exam   There were no vitals taken for this visit. Wt Readings from Last 3 Encounters:  07/15/24 212 lb (96.2 kg)  07/09/24 212 lb (96.2 kg)  04/02/24 199 lb (90.3 kg)       Assessment & Plan:   Problem List Items Addressed This Visit     Anemia   No current symptoms. Update CBC.      Chronic renal insufficiency   Hydrate and monitor.      Essential hypertension   Well controlled, no changes to meds. Encouraged heart healthy diet such as the DASH diet and exercise as tolerated.        Gout   No recent flares. Continue allopurinol .      Hyperlipidemia   Continue current medications. Encourage heart healthy diet such as MIND or DASH diet, increase exercise, avoid trans fats, simple carbohydrates and processed foods, consider a krill or fish or flaxseed oil cap daily.        Hypothyroidism   No meds currently. Update labs today.       Non-insulin treated type 2 diabetes mellitus  (HCC)   hgba1c acceptable, minimize simple carbs. Increase exercise as tolerated. Continue current medicatons.      Preventative health care - Primary   Patient encouraged to maintain heart healthy diet, regular exercise, adequate sleep. Consider daily probiotics. Take medications as prescribed. Labs ordered and reviewed        Vitamin D  deficiency   On Vitamin D  supplementation, dose unclear. -Check Vitamin D  levels Supplement and monitor.        HCM Immunizations- PNA, Covid-19, Influenza, Shingles- due      I am having Traver L. Elouise maintain his aspirin, Garlic Oil, COD LIVER OIL PO, One-A-Day Mens Health Formula, True Metrix Air Glucose Meter, TRUEplus Lancets 33G, HYDROcodone -acetaminophen , latanoprost , True Metrix Blood Glucose Test, promethazine , hydrochlorothiazide , metFORMIN , fenofibrate , diclofenac  Sodium, escitalopram , pioglitazone , tamsulosin , sildenafil , finasteride , rOPINIRole , allopurinol , atorvastatin , and dicyclomine .  No orders of the defined types were placed in this encounter.

## 2024-08-12 NOTE — Assessment & Plan Note (Signed)
 Continue current medications. Encourage heart healthy diet such as MIND or DASH diet, increase exercise, avoid trans fats, simple carbohydrates and processed foods, consider a krill or fish or flaxseed oil cap daily.

## 2024-08-12 NOTE — Assessment & Plan Note (Signed)
No recent flares.  Continue allopurinol.

## 2024-08-12 NOTE — Assessment & Plan Note (Signed)
 No current symptoms. Update CBC.

## 2024-08-12 NOTE — Assessment & Plan Note (Signed)
 Hydrate and monitor

## 2024-08-12 NOTE — Assessment & Plan Note (Signed)
 Well controlled, no changes to meds. Encouraged heart healthy diet such as the DASH diet and exercise as tolerated.

## 2024-08-12 NOTE — Assessment & Plan Note (Signed)
 No meds currently. Update labs today.

## 2024-08-12 NOTE — Assessment & Plan Note (Signed)
 On Vitamin D  supplementation, dose unclear. -Check Vitamin D  levels Supplement and monitor.

## 2024-08-12 NOTE — Assessment & Plan Note (Signed)
 Patient encouraged to maintain heart healthy diet, regular exercise, adequate sleep. Consider daily probiotics. Take medications as prescribed. Labs ordered and reviewed

## 2024-08-12 NOTE — Assessment & Plan Note (Signed)
 hgba1c acceptable, minimize simple carbs. Increase exercise as tolerated. Continue current medicatons.

## 2024-08-13 ENCOUNTER — Encounter: Payer: Self-pay | Admitting: Student

## 2024-08-13 ENCOUNTER — Ambulatory Visit (INDEPENDENT_AMBULATORY_CARE_PROVIDER_SITE_OTHER): Admitting: Student

## 2024-08-13 VITALS — BP 138/80 | HR 60 | Temp 97.9°F | Resp 12 | Ht 72.0 in | Wt 202.2 lb

## 2024-08-13 DIAGNOSIS — Z79899 Other long term (current) drug therapy: Secondary | ICD-10-CM | POA: Diagnosis not present

## 2024-08-13 DIAGNOSIS — M545 Low back pain, unspecified: Secondary | ICD-10-CM | POA: Diagnosis not present

## 2024-08-13 DIAGNOSIS — I10 Essential (primary) hypertension: Secondary | ICD-10-CM

## 2024-08-13 DIAGNOSIS — E7849 Other hyperlipidemia: Secondary | ICD-10-CM | POA: Diagnosis not present

## 2024-08-13 DIAGNOSIS — D649 Anemia, unspecified: Secondary | ICD-10-CM

## 2024-08-13 DIAGNOSIS — N189 Chronic kidney disease, unspecified: Secondary | ICD-10-CM

## 2024-08-13 DIAGNOSIS — M1A0710 Idiopathic chronic gout, right ankle and foot, without tophus (tophi): Secondary | ICD-10-CM

## 2024-08-13 DIAGNOSIS — E119 Type 2 diabetes mellitus without complications: Secondary | ICD-10-CM | POA: Diagnosis not present

## 2024-08-13 DIAGNOSIS — R252 Cramp and spasm: Secondary | ICD-10-CM | POA: Diagnosis not present

## 2024-08-13 DIAGNOSIS — E559 Vitamin D deficiency, unspecified: Secondary | ICD-10-CM | POA: Diagnosis not present

## 2024-08-13 DIAGNOSIS — Z0000 Encounter for general adult medical examination without abnormal findings: Secondary | ICD-10-CM | POA: Diagnosis not present

## 2024-08-13 DIAGNOSIS — G8929 Other chronic pain: Secondary | ICD-10-CM

## 2024-08-13 DIAGNOSIS — E038 Other specified hypothyroidism: Secondary | ICD-10-CM | POA: Diagnosis not present

## 2024-08-13 LAB — MICROALBUMIN / CREATININE URINE RATIO
Creatinine,U: 99.1 mg/dL
Microalb Creat Ratio: 14.2 mg/g (ref 0.0–30.0)
Microalb, Ur: 1.4 mg/dL (ref 0.0–1.9)

## 2024-08-13 MED ORDER — HYDROCODONE-ACETAMINOPHEN 10-325 MG PO TABS: 1.0000 | ORAL_TABLET | Freq: Two times a day (BID) | ORAL | 0 refills | Status: AC | PRN

## 2024-08-13 NOTE — Assessment & Plan Note (Signed)
Encouraged moist heat and gentle stretching as tolerated. May try NSAIDs and prescription meds as directed and report if symptoms worsen or seek immediate care,uses Hydrocodone sparingly prn when he has severe pain. Contract and UDS updated today and refill given.

## 2024-08-13 NOTE — Addendum Note (Signed)
 Addended by: TRECIA ALMARIE GRADE on: 08/13/2024 02:22 PM   Modules accepted: Orders

## 2024-08-13 NOTE — Assessment & Plan Note (Signed)
 UDS and contract updated today

## 2024-08-14 ENCOUNTER — Other Ambulatory Visit (INDEPENDENT_AMBULATORY_CARE_PROVIDER_SITE_OTHER)

## 2024-08-14 DIAGNOSIS — E119 Type 2 diabetes mellitus without complications: Secondary | ICD-10-CM | POA: Diagnosis not present

## 2024-08-14 DIAGNOSIS — E559 Vitamin D deficiency, unspecified: Secondary | ICD-10-CM | POA: Diagnosis not present

## 2024-08-14 DIAGNOSIS — M1A0710 Idiopathic chronic gout, right ankle and foot, without tophus (tophi): Secondary | ICD-10-CM

## 2024-08-14 DIAGNOSIS — R252 Cramp and spasm: Secondary | ICD-10-CM | POA: Diagnosis not present

## 2024-08-14 DIAGNOSIS — E7849 Other hyperlipidemia: Secondary | ICD-10-CM

## 2024-08-14 DIAGNOSIS — E038 Other specified hypothyroidism: Secondary | ICD-10-CM | POA: Diagnosis not present

## 2024-08-14 DIAGNOSIS — D649 Anemia, unspecified: Secondary | ICD-10-CM | POA: Diagnosis not present

## 2024-08-14 LAB — LIPID PANEL
Cholesterol: 108 mg/dL (ref 0–200)
HDL: 39.2 mg/dL (ref >39.00)
LDL Cholesterol: 37 mg/dL (ref 0–99)
NonHDL: 69.05
Total CHOL/HDL Ratio: 3
Triglycerides: 160 mg/dL — ABNORMAL HIGH (ref 0.0–149.0)
VLDL: 32 mg/dL (ref 0.0–40.0)

## 2024-08-14 LAB — URIC ACID: Uric Acid, Serum: 4.7 mg/dL (ref 4.0–7.8)

## 2024-08-14 LAB — COMPREHENSIVE METABOLIC PANEL WITH GFR
ALT: 21 U/L (ref 0–53)
AST: 24 U/L (ref 0–37)
Albumin: 4.1 g/dL (ref 3.5–5.2)
Alkaline Phosphatase: 47 U/L (ref 39–117)
BUN: 17 mg/dL (ref 6–23)
CO2: 27 meq/L (ref 19–32)
Calcium: 10.3 mg/dL (ref 8.4–10.5)
Chloride: 100 meq/L (ref 96–112)
Creatinine, Ser: 1.58 mg/dL — ABNORMAL HIGH (ref 0.40–1.50)
GFR: 41.32 mL/min — ABNORMAL LOW (ref >60.00)
Glucose, Bld: 85 mg/dL (ref 70–99)
Potassium: 4.3 meq/L (ref 3.5–5.1)
Sodium: 134 meq/L — ABNORMAL LOW (ref 135–145)
Total Bilirubin: 0.4 mg/dL (ref 0.2–1.2)
Total Protein: 7.9 g/dL (ref 6.0–8.3)

## 2024-08-14 LAB — CBC WITH DIFFERENTIAL/PLATELET
Basophils Absolute: 0 K/uL (ref 0.0–0.1)
Basophils Relative: 0.6 % (ref 0.0–3.0)
Eosinophils Absolute: 0.1 K/uL (ref 0.0–0.7)
Eosinophils Relative: 2.1 % (ref 0.0–5.0)
HCT: 42.3 % (ref 39.0–52.0)
Hemoglobin: 13.6 g/dL (ref 13.0–17.0)
Lymphocytes Relative: 39.3 % (ref 12.0–46.0)
Lymphs Abs: 2 K/uL (ref 0.7–4.0)
MCHC: 32.2 g/dL (ref 30.0–36.0)
MCV: 78.4 fl (ref 78.0–100.0)
Monocytes Absolute: 0.5 K/uL (ref 0.1–1.0)
Monocytes Relative: 10.5 % (ref 3.0–12.0)
Neutro Abs: 2.4 K/uL (ref 1.4–7.7)
Neutrophils Relative %: 47.5 % (ref 43.0–77.0)
Platelets: 225 K/uL (ref 150.0–400.0)
RBC: 5.4 Mil/uL (ref 4.22–5.81)
RDW: 16.6 % — ABNORMAL HIGH (ref 11.5–15.5)
WBC: 5.1 K/uL (ref 4.0–10.5)

## 2024-08-14 LAB — HEMOGLOBIN A1C: Hgb A1c MFr Bld: 6.6 % — ABNORMAL HIGH (ref 4.6–6.5)

## 2024-08-14 LAB — VITAMIN D 25 HYDROXY (VIT D DEFICIENCY, FRACTURES): VITD: 68.87 ng/mL (ref 30.00–100.00)

## 2024-08-14 LAB — VITAMIN B12: Vitamin B-12: 533 pg/mL (ref 211–911)

## 2024-08-14 LAB — TSH: TSH: 2.39 u[IU]/mL (ref 0.35–5.50)

## 2024-08-15 ENCOUNTER — Ambulatory Visit: Payer: Self-pay | Admitting: Student

## 2024-08-15 LAB — DRUG MONITORING PANEL 376104, URINE
Amphetamines: NEGATIVE ng/mL (ref <500)
Barbiturates: NEGATIVE ng/mL (ref <300)
Benzodiazepines: NEGATIVE ng/mL (ref <100)
Cocaine Metabolite: NEGATIVE ng/mL (ref <150)
Desmethyltramadol: NEGATIVE ng/mL (ref <100)
Opiates: NEGATIVE ng/mL (ref <100)
Oxycodone: NEGATIVE ng/mL (ref <100)
Tramadol: NEGATIVE ng/mL (ref <100)

## 2024-08-15 LAB — DM TEMPLATE

## 2024-08-29 ENCOUNTER — Telehealth: Payer: Self-pay | Admitting: Pharmacist

## 2024-08-29 DIAGNOSIS — E118 Type 2 diabetes mellitus with unspecified complications: Secondary | ICD-10-CM

## 2024-08-29 DIAGNOSIS — M109 Gout, unspecified: Secondary | ICD-10-CM

## 2024-08-29 DIAGNOSIS — G2581 Restless legs syndrome: Secondary | ICD-10-CM

## 2024-08-29 DIAGNOSIS — E559 Vitamin D deficiency, unspecified: Secondary | ICD-10-CM

## 2024-08-29 DIAGNOSIS — N529 Male erectile dysfunction, unspecified: Secondary | ICD-10-CM

## 2024-08-29 DIAGNOSIS — D649 Anemia, unspecified: Secondary | ICD-10-CM

## 2024-08-29 DIAGNOSIS — R972 Elevated prostate specific antigen [PSA]: Secondary | ICD-10-CM

## 2024-08-29 MED ORDER — METFORMIN HCL 500 MG PO TABS
500.0000 mg | ORAL_TABLET | Freq: Every day | ORAL | 1 refills | Status: DC
Start: 2024-08-29 — End: 2024-09-08

## 2024-08-29 MED ORDER — FENOFIBRATE 145 MG PO TABS: 145.0000 mg | ORAL_TABLET | Freq: Every day | ORAL | 1 refills | Status: AC

## 2024-08-29 MED ORDER — PIOGLITAZONE HCL 15 MG PO TABS: 15.0000 mg | ORAL_TABLET | Freq: Every day | ORAL | 1 refills | Status: DC

## 2024-08-29 MED ORDER — ATORVASTATIN CALCIUM 40 MG PO TABS: 40.0000 mg | ORAL_TABLET | Freq: Every day | ORAL | 1 refills | Status: AC

## 2024-08-29 NOTE — Progress Notes (Signed)
 Pharmacy Quality Measure Review  This patient is appearing on a report for being at risk of failing the adherence measure for diabetes medications this calendar year.   Medication: metformin  500mg  twice a day but during recent office visit with [name], patient mentioned he is only taking once daily Last fill date: 12/27/2023 for 100 day supply  Medication: pioglitazone  15mg  daily Last fill date: 06/17/2024 for 30 day supply   Lab Results  Component Value Date   HGBA1C 6.6 (H) 08/14/2024   Lab Results  Component Value Date   CHOL 108 08/14/2024   HDL 39.20 08/14/2024   LDLCALC 37 08/14/2024   LDLDIRECT 93.0 04/18/2021   TRIG 160.0 (H) 08/14/2024   CHOLHDL 3 08/14/2024     Sent in updated prescriptions for the medication below to last until next follow up. Left message for patient that medication refill has been sent to [facility]. Left my CB# [phone]  Meds ordered this encounter  Medications   metFORMIN  (GLUCOPHAGE ) 500 MG tablet    Sig: Take 1 tablet (500 mg total) by mouth daily with breakfast. TAKE 1 TABLET BY MOUTH TWICE  DAILY WITH MEALS    Dispense:  100 tablet    Refill:  1    Please cancel any previous prescriptions. Fill / Hold based on patient's refill schedule.   pioglitazone  (ACTOS ) 15 MG tablet    Sig: Take 1 tablet (15 mg total) by mouth daily.    Dispense:  100 tablet    Refill:  1    Please cancel any previous prescriptions. Fill / Hold based on patient's refill schedule.   atorvastatin  (LIPITOR) 40 MG tablet    Sig: Take 1 tablet (40 mg total) by mouth at bedtime.    Dispense:  100 tablet    Refill:  1    Please cancel any previous prescriptions. Fill / Hold based on patient's refill schedule.   fenofibrate  (TRICOR ) 145 MG tablet    Sig: Take 1 tablet (145 mg total) by mouth daily.    Dispense:  100 tablet    Refill:  1    Please cancel any previous prescriptions. Fill / Hold based on patient's refill schedule.    Madelin Ray,  PharmD Clinical Pharmacist [facility]  Population Health [phone]

## 2024-08-30 ENCOUNTER — Other Ambulatory Visit: Payer: Self-pay | Admitting: Family Medicine

## 2024-09-08 ENCOUNTER — Other Ambulatory Visit: Payer: Self-pay | Admitting: *Deleted

## 2024-09-08 DIAGNOSIS — E559 Vitamin D deficiency, unspecified: Secondary | ICD-10-CM

## 2024-09-08 DIAGNOSIS — G2581 Restless legs syndrome: Secondary | ICD-10-CM

## 2024-09-08 DIAGNOSIS — N529 Male erectile dysfunction, unspecified: Secondary | ICD-10-CM

## 2024-09-08 DIAGNOSIS — M109 Gout, unspecified: Secondary | ICD-10-CM

## 2024-09-08 DIAGNOSIS — R972 Elevated prostate specific antigen [PSA]: Secondary | ICD-10-CM

## 2024-09-08 DIAGNOSIS — D649 Anemia, unspecified: Secondary | ICD-10-CM

## 2024-09-08 DIAGNOSIS — E118 Type 2 diabetes mellitus with unspecified complications: Secondary | ICD-10-CM

## 2024-09-08 MED ORDER — METFORMIN HCL 500 MG PO TABS: 500.0000 mg | ORAL_TABLET | Freq: Every day | ORAL | 1 refills | Status: DC

## 2024-09-08 MED ORDER — METFORMIN HCL 500 MG PO TABS
500.0000 mg | ORAL_TABLET | Freq: Every day | ORAL | 1 refills | Status: DC
Start: 2024-09-08 — End: 2024-09-08

## 2024-09-08 MED ORDER — METFORMIN HCL 500 MG PO TABS
500.0000 mg | ORAL_TABLET | Freq: Every day | ORAL | 1 refills | Status: AC
Start: 2024-09-08 — End: ?

## 2024-09-08 NOTE — Addendum Note (Signed)
 Addended by: ESTELLE GILLIS D on: 09/08/2024 10:56 AM   Modules accepted: Orders

## 2024-09-08 NOTE — Addendum Note (Signed)
 Addended by: ESTELLE GILLIS D on: 09/08/2024 10:54 AM   Modules accepted: Orders

## 2024-09-11 ENCOUNTER — Other Ambulatory Visit: Payer: Self-pay | Admitting: Family Medicine

## 2024-09-19 DIAGNOSIS — H35361 Drusen (degenerative) of macula, right eye: Secondary | ICD-10-CM | POA: Diagnosis not present

## 2024-09-19 DIAGNOSIS — E119 Type 2 diabetes mellitus without complications: Secondary | ICD-10-CM | POA: Diagnosis not present

## 2024-09-19 DIAGNOSIS — H02831 Dermatochalasis of right upper eyelid: Secondary | ICD-10-CM | POA: Diagnosis not present

## 2024-09-19 DIAGNOSIS — H04123 Dry eye syndrome of bilateral lacrimal glands: Secondary | ICD-10-CM | POA: Diagnosis not present

## 2024-09-19 DIAGNOSIS — H43393 Other vitreous opacities, bilateral: Secondary | ICD-10-CM | POA: Diagnosis not present

## 2024-09-19 DIAGNOSIS — Z7984 Long term (current) use of oral hypoglycemic drugs: Secondary | ICD-10-CM | POA: Diagnosis not present

## 2024-09-19 DIAGNOSIS — H524 Presbyopia: Secondary | ICD-10-CM | POA: Diagnosis not present

## 2024-09-19 DIAGNOSIS — H52203 Unspecified astigmatism, bilateral: Secondary | ICD-10-CM | POA: Diagnosis not present

## 2024-09-19 DIAGNOSIS — H35033 Hypertensive retinopathy, bilateral: Secondary | ICD-10-CM | POA: Diagnosis not present

## 2024-09-19 DIAGNOSIS — H0100A Unspecified blepharitis right eye, upper and lower eyelids: Secondary | ICD-10-CM | POA: Diagnosis not present

## 2024-09-19 DIAGNOSIS — H25042 Posterior subcapsular polar age-related cataract, left eye: Secondary | ICD-10-CM | POA: Diagnosis not present

## 2024-09-19 DIAGNOSIS — H25011 Cortical age-related cataract, right eye: Secondary | ICD-10-CM | POA: Diagnosis not present

## 2024-09-19 DIAGNOSIS — H401132 Primary open-angle glaucoma, bilateral, moderate stage: Secondary | ICD-10-CM | POA: Diagnosis not present

## 2024-09-19 DIAGNOSIS — H2513 Age-related nuclear cataract, bilateral: Secondary | ICD-10-CM | POA: Diagnosis not present

## 2024-10-09 ENCOUNTER — Other Ambulatory Visit: Payer: Self-pay | Admitting: Family Medicine

## 2024-10-22 ENCOUNTER — Other Ambulatory Visit: Payer: Self-pay | Admitting: Family Medicine

## 2024-11-15 ENCOUNTER — Other Ambulatory Visit: Payer: Self-pay | Admitting: Family Medicine

## 2024-12-22 ENCOUNTER — Other Ambulatory Visit: Payer: Self-pay | Admitting: Family Medicine

## 2025-01-19 ENCOUNTER — Ambulatory Visit: Payer: Medicare Other | Admitting: Urology

## 2025-02-11 ENCOUNTER — Ambulatory Visit: Admitting: Student

## 2025-07-21 ENCOUNTER — Ambulatory Visit
